# Patient Record
Sex: Male | Born: 1950
Health system: Southern US, Community
[De-identification: ages and names within clinical notes are randomized; demographics above are authoritative.]

## PROBLEM LIST (undated history)

## (undated) DIAGNOSIS — Z860100 Personal history of colon polyps, unspecified: Secondary | ICD-10-CM

## (undated) DIAGNOSIS — I10 Essential (primary) hypertension: Secondary | ICD-10-CM

## (undated) DIAGNOSIS — G5600 Carpal tunnel syndrome, unspecified upper limb: Secondary | ICD-10-CM

## (undated) DIAGNOSIS — M1990 Unspecified osteoarthritis, unspecified site: Secondary | ICD-10-CM

## (undated) DIAGNOSIS — E785 Hyperlipidemia, unspecified: Secondary | ICD-10-CM

## (undated) DIAGNOSIS — I209 Angina pectoris, unspecified: Secondary | ICD-10-CM

## (undated) DIAGNOSIS — N200 Calculus of kidney: Secondary | ICD-10-CM

## (undated) DIAGNOSIS — R0602 Shortness of breath: Secondary | ICD-10-CM

## (undated) DIAGNOSIS — E119 Type 2 diabetes mellitus without complications: Secondary | ICD-10-CM

## (undated) DIAGNOSIS — K219 Gastro-esophageal reflux disease without esophagitis: Secondary | ICD-10-CM

## (undated) DIAGNOSIS — G4733 Obstructive sleep apnea (adult) (pediatric): Secondary | ICD-10-CM

## (undated) DIAGNOSIS — I2510 Atherosclerotic heart disease of native coronary artery without angina pectoris: Secondary | ICD-10-CM

## (undated) DIAGNOSIS — N400 Enlarged prostate without lower urinary tract symptoms: Secondary | ICD-10-CM

## (undated) HISTORY — DX: Calculus of kidney: N20.0

## (undated) HISTORY — DX: Hyperlipidemia, unspecified: E78.5

## (undated) HISTORY — DX: Obstructive sleep apnea (adult) (pediatric): G47.33

## (undated) HISTORY — DX: Personal history of colon polyps, unspecified: Z86.0100

## (undated) HISTORY — DX: Carpal tunnel syndrome, unspecified upper limb: G56.00

## (undated) HISTORY — PX: HERNIA REPAIR: SHX51

## (undated) HISTORY — DX: Unspecified osteoarthritis, unspecified site: M19.90

## (undated) HISTORY — PX: CARDIAC CATHETERIZATION: SHX172

## (undated) HISTORY — DX: Essential (primary) hypertension: I10

## (undated) HISTORY — DX: Benign prostatic hyperplasia without lower urinary tract symptoms: N40.0

## (undated) HISTORY — PX: KNEE SURGERY: SHX244

## (undated) HISTORY — DX: Personal history of colonic polyps: Z86.010

---

## 1998-06-27 ENCOUNTER — Observation Stay (HOSPITAL_COMMUNITY): Admission: RE | Admit: 1998-06-27 | Discharge: 1998-06-28 | Payer: Self-pay | Admitting: *Deleted

## 2000-07-04 ENCOUNTER — Encounter: Payer: Self-pay | Admitting: Family Medicine

## 2000-07-04 ENCOUNTER — Encounter: Admission: RE | Admit: 2000-07-04 | Discharge: 2000-07-04 | Payer: Self-pay | Admitting: Family Medicine

## 2000-07-06 DIAGNOSIS — I671 Cerebral aneurysm, nonruptured: Secondary | ICD-10-CM

## 2000-07-15 ENCOUNTER — Encounter: Payer: Self-pay | Admitting: Internal Medicine

## 2000-07-15 ENCOUNTER — Encounter: Payer: Self-pay | Admitting: Emergency Medicine

## 2000-07-15 ENCOUNTER — Inpatient Hospital Stay (HOSPITAL_COMMUNITY): Admission: EM | Admit: 2000-07-15 | Discharge: 2000-07-16 | Payer: Self-pay | Admitting: Emergency Medicine

## 2000-07-29 ENCOUNTER — Encounter: Payer: Self-pay | Admitting: Internal Medicine

## 2001-12-27 ENCOUNTER — Encounter: Admission: RE | Admit: 2001-12-27 | Discharge: 2001-12-27 | Payer: Self-pay | Admitting: Family Medicine

## 2001-12-27 ENCOUNTER — Encounter: Payer: Self-pay | Admitting: Internal Medicine

## 2001-12-27 ENCOUNTER — Encounter: Payer: Self-pay | Admitting: Family Medicine

## 2002-08-09 ENCOUNTER — Emergency Department (HOSPITAL_COMMUNITY): Admission: EM | Admit: 2002-08-09 | Discharge: 2002-08-10 | Payer: Self-pay | Admitting: Emergency Medicine

## 2002-08-10 ENCOUNTER — Encounter: Payer: Self-pay | Admitting: Emergency Medicine

## 2002-08-11 ENCOUNTER — Observation Stay (HOSPITAL_COMMUNITY): Admission: RE | Admit: 2002-08-11 | Discharge: 2002-08-12 | Payer: Self-pay | Admitting: Urology

## 2002-08-11 ENCOUNTER — Encounter: Payer: Self-pay | Admitting: Urology

## 2002-08-20 ENCOUNTER — Ambulatory Visit (HOSPITAL_BASED_OUTPATIENT_CLINIC_OR_DEPARTMENT_OTHER): Admission: RE | Admit: 2002-08-20 | Discharge: 2002-08-20 | Payer: Self-pay | Admitting: Urology

## 2002-08-20 ENCOUNTER — Encounter: Payer: Self-pay | Admitting: Urology

## 2004-12-24 ENCOUNTER — Ambulatory Visit: Payer: Self-pay | Admitting: Internal Medicine

## 2005-01-25 ENCOUNTER — Ambulatory Visit: Payer: Self-pay | Admitting: Internal Medicine

## 2005-12-30 ENCOUNTER — Ambulatory Visit: Payer: Self-pay | Admitting: Internal Medicine

## 2006-01-28 ENCOUNTER — Ambulatory Visit: Payer: Self-pay | Admitting: Internal Medicine

## 2006-02-01 ENCOUNTER — Ambulatory Visit: Payer: Self-pay | Admitting: Internal Medicine

## 2006-12-22 ENCOUNTER — Ambulatory Visit: Payer: Self-pay | Admitting: Internal Medicine

## 2007-01-20 ENCOUNTER — Ambulatory Visit: Payer: Self-pay | Admitting: Gastroenterology

## 2007-01-23 ENCOUNTER — Ambulatory Visit: Payer: Self-pay | Admitting: Internal Medicine

## 2007-01-25 ENCOUNTER — Ambulatory Visit: Payer: Self-pay

## 2007-02-03 ENCOUNTER — Ambulatory Visit: Payer: Self-pay | Admitting: Gastroenterology

## 2007-02-03 ENCOUNTER — Encounter: Payer: Self-pay | Admitting: Gastroenterology

## 2007-02-04 HISTORY — PX: MENISCUS REPAIR: SHX5179

## 2007-02-23 ENCOUNTER — Ambulatory Visit (HOSPITAL_COMMUNITY): Admission: RE | Admit: 2007-02-23 | Discharge: 2007-02-23 | Payer: Self-pay | Admitting: Orthopedic Surgery

## 2007-04-11 ENCOUNTER — Ambulatory Visit: Payer: Self-pay | Admitting: Gastroenterology

## 2007-04-17 DIAGNOSIS — E785 Hyperlipidemia, unspecified: Secondary | ICD-10-CM | POA: Insufficient documentation

## 2007-04-17 DIAGNOSIS — G5600 Carpal tunnel syndrome, unspecified upper limb: Secondary | ICD-10-CM

## 2007-04-17 DIAGNOSIS — I10 Essential (primary) hypertension: Secondary | ICD-10-CM

## 2007-04-17 DIAGNOSIS — G4733 Obstructive sleep apnea (adult) (pediatric): Secondary | ICD-10-CM

## 2007-04-24 ENCOUNTER — Ambulatory Visit: Payer: Self-pay | Admitting: Internal Medicine

## 2007-04-25 LAB — CONVERTED CEMR LAB
ALT: 24 units/L (ref 0–40)
AST: 19 units/L (ref 0–37)
Albumin: 4.2 g/dL (ref 3.5–5.2)
Alkaline Phosphatase: 86 units/L (ref 39–117)
Basophils Absolute: 0.1 10*3/uL (ref 0.0–0.1)
CO2: 30 meq/L (ref 19–32)
Creatinine, Ser: 1 mg/dL (ref 0.4–1.5)
Hgb A1c MFr Bld: 7.5 % — ABNORMAL HIGH (ref 4.6–6.0)
MCHC: 34.2 g/dL (ref 30.0–36.0)
Monocytes Relative: 7.6 % (ref 3.0–11.0)
Phosphorus: 3.5 mg/dL (ref 2.3–4.6)
RBC: 5.27 M/uL (ref 4.22–5.81)
RDW: 12.9 % (ref 11.5–14.6)
Sodium: 140 meq/L (ref 135–145)

## 2007-10-26 ENCOUNTER — Ambulatory Visit: Payer: Self-pay | Admitting: Internal Medicine

## 2007-10-26 DIAGNOSIS — M1710 Unilateral primary osteoarthritis, unspecified knee: Secondary | ICD-10-CM

## 2007-10-26 DIAGNOSIS — M179 Osteoarthritis of knee, unspecified: Secondary | ICD-10-CM | POA: Insufficient documentation

## 2007-10-27 ENCOUNTER — Ambulatory Visit: Payer: Self-pay | Admitting: Pulmonary Disease

## 2007-11-01 ENCOUNTER — Encounter: Admission: RE | Admit: 2007-11-01 | Discharge: 2007-11-01 | Payer: Self-pay | Admitting: Internal Medicine

## 2007-11-01 ENCOUNTER — Encounter: Payer: Self-pay | Admitting: Internal Medicine

## 2007-11-14 ENCOUNTER — Encounter: Payer: Self-pay | Admitting: Pulmonary Disease

## 2007-11-23 ENCOUNTER — Telehealth (INDEPENDENT_AMBULATORY_CARE_PROVIDER_SITE_OTHER): Payer: Self-pay | Admitting: *Deleted

## 2007-11-28 ENCOUNTER — Ambulatory Visit: Payer: Self-pay | Admitting: Internal Medicine

## 2007-11-28 DIAGNOSIS — N401 Enlarged prostate with lower urinary tract symptoms: Secondary | ICD-10-CM

## 2007-11-28 DIAGNOSIS — N138 Other obstructive and reflux uropathy: Secondary | ICD-10-CM

## 2007-11-28 LAB — CONVERTED CEMR LAB
ALT: 26 units/L (ref 0–53)
AST: 20 units/L (ref 0–37)
Albumin: 4.1 g/dL (ref 3.5–5.2)
Alkaline Phosphatase: 81 units/L (ref 39–117)
BUN: 13 mg/dL (ref 6–23)
Basophils Relative: 0.9 % (ref 0.0–1.0)
CO2: 29 meq/L (ref 19–32)
Cholesterol: 214 mg/dL (ref 0–200)
Direct LDL: 135.4 mg/dL
Glucose, Bld: 154 mg/dL — ABNORMAL HIGH (ref 70–99)
Hemoglobin: 15.9 g/dL (ref 13.0–17.0)
Monocytes Absolute: 0.7 10*3/uL (ref 0.2–0.7)
Monocytes Relative: 9.7 % (ref 3.0–11.0)
Phosphorus: 3 mg/dL (ref 2.3–4.6)
Potassium: 4.1 meq/L (ref 3.5–5.1)
RBC: 5.23 M/uL (ref 4.22–5.81)
RDW: 12.7 % (ref 11.5–14.6)
Sodium: 139 meq/L (ref 135–145)
TSH: 1.82 microintl units/mL (ref 0.35–5.50)
Total Bilirubin: 0.9 mg/dL (ref 0.3–1.2)
Total CHOL/HDL Ratio: 5.6
VLDL: 59 mg/dL — ABNORMAL HIGH (ref 0–40)

## 2007-12-28 ENCOUNTER — Telehealth (INDEPENDENT_AMBULATORY_CARE_PROVIDER_SITE_OTHER): Payer: Self-pay | Admitting: *Deleted

## 2008-02-15 ENCOUNTER — Telehealth (INDEPENDENT_AMBULATORY_CARE_PROVIDER_SITE_OTHER): Payer: Self-pay | Admitting: *Deleted

## 2008-03-04 ENCOUNTER — Ambulatory Visit: Payer: Self-pay | Admitting: Internal Medicine

## 2008-03-13 ENCOUNTER — Encounter: Payer: Self-pay | Admitting: Internal Medicine

## 2008-03-13 ENCOUNTER — Ambulatory Visit: Payer: Self-pay

## 2008-04-02 ENCOUNTER — Telehealth (INDEPENDENT_AMBULATORY_CARE_PROVIDER_SITE_OTHER): Payer: Self-pay | Admitting: *Deleted

## 2008-04-15 ENCOUNTER — Telehealth (INDEPENDENT_AMBULATORY_CARE_PROVIDER_SITE_OTHER): Payer: Self-pay | Admitting: *Deleted

## 2008-05-10 ENCOUNTER — Ambulatory Visit: Payer: Self-pay | Admitting: Internal Medicine

## 2008-06-03 ENCOUNTER — Telehealth: Payer: Self-pay | Admitting: Family Medicine

## 2008-07-15 ENCOUNTER — Telehealth (INDEPENDENT_AMBULATORY_CARE_PROVIDER_SITE_OTHER): Payer: Self-pay | Admitting: *Deleted

## 2008-07-25 ENCOUNTER — Telehealth (INDEPENDENT_AMBULATORY_CARE_PROVIDER_SITE_OTHER): Payer: Self-pay | Admitting: *Deleted

## 2008-09-03 ENCOUNTER — Ambulatory Visit: Payer: Self-pay | Admitting: Internal Medicine

## 2008-09-06 LAB — CONVERTED CEMR LAB
ALT: 26 units/L (ref 0–53)
Alkaline Phosphatase: 74 units/L (ref 39–117)
BUN: 18 mg/dL (ref 6–23)
Bilirubin, Direct: 0.1 mg/dL (ref 0.0–0.3)
Chloride: 101 meq/L (ref 96–112)
Cholesterol: 233 mg/dL (ref 0–200)
Creatinine, Ser: 0.9 mg/dL (ref 0.4–1.5)
Eosinophils Relative: 2.7 % (ref 0.0–5.0)
GFR calc Af Amer: 112 mL/min
HDL: 32.4 mg/dL — ABNORMAL LOW (ref >39.0)
Microalb Creat Ratio: 45.6 mg/g — ABNORMAL HIGH (ref 0.0–30.0)
Monocytes Relative: 9.9 % (ref 3.0–12.0)
Neutrophils Relative %: 54.4 % (ref 43.0–77.0)
PSA: 0.47 ng/mL (ref 0.10–4.00)
Phosphorus: 2.5 mg/dL (ref 2.3–4.6)
Platelets: 222 10*3/uL (ref 150–400)
TSH: 2.2 microintl units/mL (ref 0.35–5.50)
Total CHOL/HDL Ratio: 7.2
Total Protein: 6.6 g/dL (ref 6.0–8.3)
Triglycerides: 416 mg/dL (ref 0–149)
WBC: 6.4 10*3/uL (ref 4.5–10.5)

## 2008-12-03 ENCOUNTER — Telehealth: Payer: Self-pay | Admitting: Internal Medicine

## 2009-01-28 ENCOUNTER — Telehealth: Payer: Self-pay | Admitting: Internal Medicine

## 2009-03-05 ENCOUNTER — Ambulatory Visit: Payer: Self-pay | Admitting: Internal Medicine

## 2009-03-06 LAB — CONVERTED CEMR LAB
Alkaline Phosphatase: 75 units/L (ref 39–117)
BUN: 15 mg/dL (ref 6–23)
Basophils Absolute: 0.1 10*3/uL (ref 0.0–0.1)
Basophils Relative: 0.9 % (ref 0.0–3.0)
Bilirubin, Direct: 0 mg/dL (ref 0.0–0.3)
CO2: 30 meq/L (ref 19–32)
Calcium: 9.1 mg/dL (ref 8.4–10.5)
Creatinine, Ser: 0.9 mg/dL (ref 0.4–1.5)
Eosinophils Absolute: 0.2 10*3/uL (ref 0.0–0.7)
Lymphocytes Relative: 31.3 % (ref 12.0–46.0)
MCHC: 33.6 g/dL (ref 30.0–36.0)
Neutrophils Relative %: 55.4 % (ref 43.0–77.0)
Platelets: 207 10*3/uL (ref 150.0–400.0)
RBC: 4.92 M/uL (ref 4.22–5.81)
RDW: 12.8 % (ref 11.5–14.6)
Sed Rate: 11 mm/hr (ref 0–22)
Total Bilirubin: 0.8 mg/dL (ref 0.3–1.2)

## 2009-03-18 ENCOUNTER — Telehealth: Payer: Self-pay | Admitting: Internal Medicine

## 2009-05-06 ENCOUNTER — Telehealth: Payer: Self-pay | Admitting: Internal Medicine

## 2009-06-11 ENCOUNTER — Ambulatory Visit: Payer: Self-pay | Admitting: Internal Medicine

## 2009-06-11 LAB — CONVERTED CEMR LAB: Hgb A1c MFr Bld: 7.6 % — ABNORMAL HIGH (ref 4.6–6.5)

## 2009-07-07 ENCOUNTER — Telehealth: Payer: Self-pay | Admitting: Internal Medicine

## 2009-11-10 ENCOUNTER — Telehealth: Payer: Self-pay | Admitting: Internal Medicine

## 2009-12-29 ENCOUNTER — Telehealth: Payer: Self-pay | Admitting: Internal Medicine

## 2009-12-30 ENCOUNTER — Ambulatory Visit: Payer: Self-pay | Admitting: Internal Medicine

## 2010-03-02 ENCOUNTER — Telehealth: Payer: Self-pay | Admitting: Internal Medicine

## 2010-04-20 ENCOUNTER — Telehealth: Payer: Self-pay | Admitting: Internal Medicine

## 2010-04-28 ENCOUNTER — Ambulatory Visit: Payer: Self-pay | Admitting: Internal Medicine

## 2010-04-28 DIAGNOSIS — L570 Actinic keratosis: Secondary | ICD-10-CM

## 2010-04-28 DIAGNOSIS — Z860100 Personal history of colon polyps, unspecified: Secondary | ICD-10-CM | POA: Insufficient documentation

## 2010-05-06 LAB — CONVERTED CEMR LAB
ALT: 27 units/L (ref 0–53)
Alkaline Phosphatase: 75 units/L (ref 39–117)
BUN: 19 mg/dL (ref 6–23)
Basophils Absolute: 0.1 10*3/uL (ref 0.0–0.1)
Bilirubin, Direct: 0.1 mg/dL (ref 0.0–0.3)
Cholesterol: 228 mg/dL — ABNORMAL HIGH (ref 0–200)
Creatinine, Ser: 0.9 mg/dL (ref 0.4–1.5)
Creatinine,U: 158.3 mg/dL
Direct LDL: 132.8 mg/dL
Eosinophils Relative: 2.9 % (ref 0.0–5.0)
GFR calc non Af Amer: 88.23 mL/min (ref >60)
Glucose, Bld: 159 mg/dL — ABNORMAL HIGH (ref 70–99)
Hgb A1c MFr Bld: 8.7 % — ABNORMAL HIGH (ref 4.6–6.5)
Lymphs Abs: 2.1 10*3/uL (ref 0.7–4.0)
Microalb Creat Ratio: 6.4 mg/g (ref 0.0–30.0)
Microalb, Ur: 10.1 mg/dL — ABNORMAL HIGH (ref 0.0–1.9)
Monocytes Absolute: 0.7 10*3/uL (ref 0.1–1.0)
Monocytes Relative: 10.5 % (ref 3.0–12.0)
Neutrophils Relative %: 53.9 % (ref 43.0–77.0)
Platelets: 220 10*3/uL (ref 150.0–400.0)
RDW: 13.6 % (ref 11.5–14.6)
Sodium: 138 meq/L (ref 135–145)
TSH: 1.45 microintl units/mL (ref 0.35–5.50)
Total Protein: 6.7 g/dL (ref 6.0–8.3)
VLDL: 73 mg/dL — ABNORMAL HIGH (ref 0.0–40.0)
WBC: 6.9 10*3/uL (ref 4.5–10.5)

## 2010-06-05 ENCOUNTER — Telehealth: Payer: Self-pay | Admitting: Family Medicine

## 2010-06-09 ENCOUNTER — Encounter: Payer: Self-pay | Admitting: Family Medicine

## 2010-06-10 ENCOUNTER — Telehealth (INDEPENDENT_AMBULATORY_CARE_PROVIDER_SITE_OTHER): Payer: Self-pay | Admitting: *Deleted

## 2010-07-08 ENCOUNTER — Telehealth: Payer: Self-pay | Admitting: Family Medicine

## 2010-07-31 ENCOUNTER — Telehealth: Payer: Self-pay | Admitting: Internal Medicine

## 2010-08-03 ENCOUNTER — Ambulatory Visit: Payer: Self-pay | Admitting: Internal Medicine

## 2010-08-31 ENCOUNTER — Telehealth: Payer: Self-pay | Admitting: Internal Medicine

## 2010-11-02 ENCOUNTER — Ambulatory Visit: Payer: Self-pay | Admitting: Internal Medicine

## 2010-11-02 LAB — HM DIABETES FOOT EXAM

## 2010-11-03 LAB — CONVERTED CEMR LAB
Albumin: 4.2 g/dL (ref 3.5–5.2)
BUN: 16 mg/dL (ref 6–23)
Calcium: 9.4 mg/dL (ref 8.4–10.5)
Creatinine, Ser: 0.9 mg/dL (ref 0.4–1.5)
Glucose, Bld: 203 mg/dL — ABNORMAL HIGH (ref 70–99)
Phosphorus: 3.3 mg/dL (ref 2.3–4.6)
Potassium: 3.9 meq/L (ref 3.5–5.1)

## 2011-01-03 LAB — CONVERTED CEMR LAB
AST: 20 units/L (ref 0–37)
Albumin: 4 g/dL (ref 3.5–5.2)
Alkaline Phosphatase: 75 units/L (ref 39–117)
Basophils Absolute: 0.1 10*3/uL (ref 0.0–0.1)
CO2: 30 meq/L (ref 19–32)
CO2: 31 meq/L (ref 19–32)
Calcium: 9.2 mg/dL (ref 8.4–10.5)
Calcium: 9.3 mg/dL (ref 8.4–10.5)
Chloride: 100 meq/L (ref 96–112)
Chloride: 104 meq/L (ref 96–112)
Cholesterol: 155 mg/dL (ref 0–200)
Cholesterol: 208 mg/dL (ref 0–200)
Eosinophils Absolute: 0.2 10*3/uL (ref 0.0–0.7)
GFR calc non Af Amer: 92 mL/min
Glucose, Bld: 159 mg/dL — ABNORMAL HIGH (ref 70–99)
Glucose, Bld: 202 mg/dL — ABNORMAL HIGH (ref 70–99)
HDL: 35.9 mg/dL — ABNORMAL LOW (ref >39.0)
HDL: 42.1 mg/dL (ref >39.0)
Hgb A1c MFr Bld: 7.5 % — ABNORMAL HIGH (ref 4.6–6.0)
LDL Cholesterol: 82 mg/dL (ref 0–99)
Lymphocytes Relative: 25.7 % (ref 12.0–46.0)
MCHC: 32.4 g/dL (ref 30.0–36.0)
MCV: 90.2 fL (ref 78.0–100.0)
Microalb Creat Ratio: 63.3 mg/g — ABNORMAL HIGH (ref 0.0–30.0)
Microalb, Ur: 7.1 mg/dL — ABNORMAL HIGH (ref 0.0–1.9)
Neutro Abs: 4.4 10*3/uL (ref 1.4–7.7)
Neutrophils Relative %: 62.4 % (ref 43.0–77.0)
Potassium: 3.9 meq/L (ref 3.5–5.1)
RDW: 12.9 % (ref 11.5–14.6)
Sed Rate: 10 mm/hr (ref 0–16)
Sed Rate: 10 mm/hr (ref 0–20)
Sodium: 136 meq/L (ref 135–145)
Sodium: 140 meq/L (ref 135–145)
TSH: 1.63 microintl units/mL (ref 0.35–5.50)
Total Bilirubin: 0.9 mg/dL (ref 0.3–1.2)
Total CK: 261 units/L (ref 7–195)
Triglycerides: 156 mg/dL — ABNORMAL HIGH (ref 0–149)
VLDL: 38 mg/dL (ref 0–40)

## 2011-01-05 NOTE — Assessment & Plan Note (Signed)
Summary: sinus/congestion/ds   Vital Signs:  Patient profile:   60 year old male Weight:      233 pounds BMI:     37.74 Temp:     98.4 degrees F oral Pulse rate:   74 / minute Pulse rhythm:   regular Resp:     12 per minute BP sitting:   140 / 80  (left arm) Cuff size:   large  Vitals Entered By: Mervin Hack CMA Duncan Dull) (December 30, 2009 12:48 PM) CC: sinus/ congestion   History of Present Illness: Started with bad cold about 1 month ago used OTC meds and felt better  Started with symptoms again 2 weeks ago "downhill since" couldn't make it into work 3 days ago felt feverish but thermometer broken generalized aching burning in nostrils coughing up purulent mucus Bloody, purulent nasal secretions--relates to using nasal decongestant  Had some SOB last night wears the CPAP and it made him feel worse due to the cold air  Tried guafenisin and DM--helped cough some  Some scratchy throat some right ear pain--intermittent  Allergies: 1)  Zocor (Simvastatin) 2)  Amaryl (Glimepiride) 3)  * Doxycycline 4)  Avandia (Rosiglitazone Maleate) 5)  Glucotrol Xl (Glipizide) 6)  Doxazosin Mesylate (Doxazosin Mesylate)  Past History:  Past medical, surgical, family and social histories (including risk factors) reviewed for relevance to current acute and chronic problems.  Past Medical History: Reviewed history from 03/04/2008 and no changes required. OSTEOARTHRITIS (ICD-715.90)--------------------------------------------------Dr Supple CEREBRAL ANEURYSM (ICD-437.3) HYPERLIPIDEMIA (ICD-272.4) COLONIC POLYPS, HX OF, TUBULAR ADENOMA (ICD-V12.72) CARPAL TUNNEL SYNDROME (ICD-354.0) SLEEP APNEA (ICD-780.57)-------------------------------------------------------Dr Clance HYPERTENSION (ICD-401.9) DIABETES MELLITUS, TYPE II, NEPHROP. (ICD-250.00) Benign prostatic hypertrophy  Past Surgical History: Reviewed history from 03/04/2008 and no changes required. Kidney stones Knee  surgery (left) Admit, vertigo negative CT, negative MRI--poss 71m aneurysm on MRA  08/01 MRA- ? 3mm carotid aneurysm  08/01 Colonoscoopy- polyp, recheck 2005, 12/02 Cardiolite stress, negative 11/04 ABI's normal --2/08 R knee meniscus repair--3/08 (Dr Rennis Chris)  Family History: Reviewed history from 04/17/2007 and no changes required. Father: Alive- CAD Mother: ?anxiety Siblings: One brother/1 sister No HTN, DM or cancer  Social History: Reviewed history from 10/26/2007 and no changes required. 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  NAD Head:  no frontal or maxillary tenderness Ears:  R ear normal and L ear normal.   Nose:  marked inflammation bilat Mouth:  no erythema and no exudates.   Neck:  supple, no masses, and no cervical lymphadenopathy.   Lungs:  normal respiratory effort, no intercostal retractions, no accessory muscle use, normal breath sounds, no dullness, no crackles, and no wheezes.     Impression & Recommendations:  Problem # 1:  SINUSITIS - ACUTE-NOS (ICD-461.9) Assessment New  seems more than mildly ill though no evidence of lower resp involvement will continue supportive care  will Rx augmentin  His updated medication list for this problem includes:    Amoxicillin-pot Clavulanate 875-125 Mg Tabs (Amoxicillin-pot clavulanate) .Marland Kitchen... 1 tab by mouth two times a day after food for sinus infection  Complete Medication List: 1)  Actos 30 Mg Tabs (Pioglitazone hcl) .... Take 1 tablet by mouth once a day 2)  Metformin Hcl 500 Mg Tabs (Metformin hcl) .... Take 1 tablet by mouth twice a day 3)  Maxzide-25 37.5-25 Mg  Tabs (Triamterene-hctz) .... Take 1 tablet by mouth once a day 4)  Diovan 160 Mg Tabs (Valsartan) .... Take 1 tablet by mouth once a day 5)  C Pap 10  6)  Vicodin  5-500 Mg Tabs (Hydrocodone-acetaminophen) .Marland Kitchen.. 1 two times a day as needed severe pain 7)  Amoxicillin-pot Clavulanate 875-125 Mg Tabs (Amoxicillin-pot clavulanate) .Marland Kitchen.. 1 tab by mouth two times a day after food for sinus infection  Patient Instructions: 1)  Please schedule a follow-up appointment as needed .  2)  Please keep regular follow up appt Prescriptions: AMOXICILLIN-POT CLAVULANATE 875-125 MG TABS (AMOXICILLIN-POT CLAVULANATE) 1 tab by mouth two times a day after food for sinus infection  #20 x 0   Entered and Authorized by:   Cindee Salt MD   Signed by:   Cindee Salt MD on 12/30/2009   Method used:   Electronically to        [facility]* (retail)       [address]       [place], [place]  [address]       Ph: [phone]       Fax: [phone]   RxID:   [id number]   Current Allergies (reviewed today): ZOCOR (SIMVASTATIN) AMARYL (GLIMEPIRIDE) * DOXYCYCLINE AVANDIA (ROSIGLITAZONE MALEATE) GLUCOTROL XL (GLIPIZIDE) DOXAZOSIN MESYLATE (DOXAZOSIN MESYLATE)

## 2011-01-05 NOTE — Assessment & Plan Note (Signed)
Summary: FOLLOW UP / LFW   Vital Signs:  Patient profile:   60 year old male Weight:      232 pounds BMI:     37.58 Temp:     98.5 degrees F oral Pulse rate:   68 / minute Pulse rhythm:   regular BP sitting:   140 / 80  (left arm) Cuff size:   large  Vitals Entered By: Mervin Hack CMA Duncan Dull) (November 02, 2010 10:20 AM) CC: 6 MONTH FOLLOW-UP   History of Present Illness: Doing okay  Has had more arm and leg pain since doubling up the metformin Has needed to use the hydrocodone two times a day lately some SOB also Had to cut back to 500mg  two times a day about 3 weeks ago Now arm pain seems to be settling down  Also noted emotional issues would fire up and get "ill" on the higher dose better again  checks random sugars a couple of times per week 140-190  Uses CPAP notes increased sinus problems in the morning Didn't hook up the saline port--he will work on that  No chest pain SOB with the higher dose of metformin---has improved again on the lower dose  Allergies: 1)  ! Losartan Potassium-Hctz (Losartan Potassium-Hctz) 2)  Zocor (Simvastatin) 3)  Amaryl (Glimepiride) 4)  * Doxycycline 5)  Avandia (Rosiglitazone Maleate) 6)  Doxazosin Mesylate (Doxazosin Mesylate) 7)  Glucotrol Xl (Glipizide)  Past History:  Past medical, surgical, family and social histories (including risk factors) reviewed for relevance to current acute and chronic problems.  Past Medical History: Reviewed history from 04/28/2010 and no changes required. Benign prostatic hypertrophy Colonic polyps, hx of---------------------tubular adenoma Diabetes mellitus, type II Hyperlipidemia Hypertension Cerebral aneurysm Carpal tunnel syndrom Obstructive sleep apnea----------------------------------------------Dr Clance Osteoarthritis  Past Surgical History: Reviewed history from 03/04/2008 and no changes required. Kidney stones Knee surgery (left) Admit, vertigo negative CT,  negative MRI--poss 23m aneurysm on MRA  08/01 MRA- ? 3mm carotid aneurysm  08/01 Colonoscoopy- polyp, recheck 2005, 12/02 Cardiolite stress, negative 11/04 ABI's normal --2/08 R knee meniscus repair--3/08 (Dr Rennis Chris)  Family History: Reviewed history from 04/17/2007 and no changes required. Father: Alive- CAD Mother: ?anxiety Siblings: One brother/1 sister No HTN, DM or cancer  Social History: Reviewed history from 10/26/2007 and no changes required. 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   Neck:  supple, no masses, no thyromegaly, no carotid bruits, and no cervical lymphadenopathy.   Lungs:  normal respiratory effort, no intercostal retractions, no accessory muscle use, and normal breath sounds.   Heart:  normal rate, regular rhythm, no murmur, and no gallop.   Abdomen:  soft and non-tender.   Pulses:  2+ in feet Extremities:  no edema Skin:  no suspicious lesions and no ulcerations.   Psych:  normally interactive, good eye contact, not anxious appearing, and not depressed appearing.    Diabetes Management Exam:    Foot Exam (with socks and/or shoes not present):       Sensory-Pinprick/Light touch:          Left medial foot (L-4): normal          Left dorsal foot (L-5): normal          Left lateral foot (S-1): normal  Right medial foot (L-4): normal          Right dorsal foot (L-5): normal          Right lateral foot (S-1): normal       Inspection:          Left foot: normal          Right foot: normal       Nails:          Left foot: normal          Right foot: normal   Impression & Recommendations:  Problem # 1:  DIABETES MELLITUS, TYPE II (ICD-250.00) Assessment Comment Only  still not compliant with diet discussed this Can't  tolerate higher metformin will add januvia if A1c over 9%  His updated medication list for this problem includes:    Metformin Hcl 1000 Mg Tabs (Metformin hcl) .Marland Kitchen... 1/2 tab by mouth two times a day before meals for diabetes    Diovan Hct 160-25 Mg Tabs (Valsartan-hydrochlorothiazide) .Marland Kitchen... 1 tablet daily for high blood pressure    Actos 30 Mg Tabs (Pioglitazone hcl) .Marland Kitchen... Take 1 tablet by mouth once a day  Labs Reviewed: Creat: 0.9 (04/28/2010)     Last Eye Exam: No retinopathy (03/06/2010) Reviewed HgBA1c results: 8.7 (04/28/2010)  7.6 (06/11/2009)  Orders: TLB-A1C / Hgb A1C (Glycohemoglobin) (83036-A1C)  Problem # 2:  HYPERTENSION (ICD-401.9) Assessment: Unchanged  reasonable control no changes  His updated medication list for this problem includes:    Diovan Hct 160-25 Mg Tabs (Valsartan-hydrochlorothiazide) .Marland Kitchen... 1 tablet daily for high blood pressure  BP today: 140/80 Prior BP: 128/70 (08/03/2010)  Labs Reviewed: K+: 3.5 (04/28/2010) Creat: : 0.9 (04/28/2010)   Chol: 228 (04/28/2010)   HDL: 39.70 (04/28/2010)   LDL: DEL (09/03/2008)   TG: 365.0 (04/28/2010)  Orders: TLB-Renal Function Panel (80069-RENAL) Venipuncture (42595)  Problem # 3:  OSTEOARTHRITIS (ICD-715.90) Assessment: Unchanged needs the analgesics at times  His updated medication list for this problem includes:    Vicodin 5-500 Mg Tabs (Hydrocodone-acetaminophen) .Marland Kitchen... 1 two times a day as needed severe pain  Problem # 4:  HYPERLIPIDEMIA (ICD-272.4) Assessment: Comment Only no meds for this due to myalgias  Complete Medication List: 1)  Metformin Hcl 1000 Mg Tabs (Metformin hcl) .... 1/2 tab by mouth two times a day before meals for diabetes 2)  Diovan Hct 160-25 Mg Tabs (Valsartan-hydrochlorothiazide) .Marland Kitchen.. 1 tablet daily for high blood pressure 3)  Actos 30 Mg Tabs (Pioglitazone hcl) .... Take 1 tablet by mouth once a day 4)  Vicodin 5-500 Mg Tabs (Hydrocodone-acetaminophen) .Marland Kitchen.. 1 two times a  day as needed severe pain 5)  C Pap 10   Patient Instructions: 1)  Please schedule a follow-up appointment in 6 months for physical Prescriptions: VICODIN 5-500 MG  TABS (HYDROCODONE-ACETAMINOPHEN) 1 two times a day as needed severe pain  #60 x 1   Entered and Authorized by:   Cindee Salt MD   Signed by:   Cindee Salt MD on 11/02/2010   Method used:   Print then Give to Patient   RxID:   [id number]    Orders Added: 1)  Est. Patient Level IV [16606] 2)  TLB-A1C / Hgb A1C (Glycohemoglobin) [83036-A1C] 3)  TLB-Renal Function Panel [80069-RENAL] 4)  Venipuncture [30160]    Current Allergies (reviewed today): ! LOSARTAN POTASSIUM-HCTZ (LOSARTAN POTASSIUM-HCTZ) ZOCOR (SIMVASTATIN) AMARYL (GLIMEPIRIDE) * DOXYCYCLINE AVANDIA (ROSIGLITAZONE MALEATE) DOXAZOSIN MESYLATE (DOXAZOSIN MESYLATE) GLUCOTROL XL (GLIPIZIDE)

## 2011-01-05 NOTE — Progress Notes (Signed)
Summary: regarding actos and bladder cancer  Phone Note Call from Patient Call back at Home Phone [phone]   Caller: Patient Call For: Cindee Salt MD Summary of Call: Pt states it's time for him to refill his actos but he is concerned about what he has heard on tv regarding actos causing bladder cancer.  Please advise on your opinion. Initial call taken by: Lowella Petties CMA,  August 31, 2010 2:35 PM  Follow-up for Phone Call        I have not heard any definitive evidence that actos is related to bladder cancer SOmetimes this stuff comes out in the popular press before doctor's have a chance to look at it in the medical journals. There have been no actions by the FDA or changes in labelling of actos so I don't think there is any clear cut problem with cancer Follow-up by: Cindee Salt MD,  August 31, 2010 5:06 PM  Additional Follow-up for Phone Call Additional follow up Details #1::        Tried to call patient and received a message stating that patient has not set up his voicemail yet. Unable to leave a message. Will try back later. Sydell Axon LPN  August 31, 2010 5:13 PM'  Left message on machine for patient to call back. Sydell Axon LPN  September 02, 2010 12:09 PM  Patient notified as instructed by telephone. Additional Follow-up by: Sydell Axon LPN,  September 02, 2010 12:56 PM

## 2011-01-05 NOTE — Progress Notes (Signed)
Summary: Head and chest congestion  Phone Note Call from Patient Call back at [phone]   Caller: Patient Call For: Cindee Salt MD Summary of Call: Pt said sinus infection and chest cold. Started 4 -5 days ago. Pt's head is congested, drainage at back of throat, scratchy throat,pt's thinks has fever, unsure how much thermometer is broken. Productive cough with grayish yellow mucus with streaks of blood, sore in chest when coughs, no shortness of breath or trouble breathing and no wheezing. Pt uses [facility] [phone]. Pt wonders if med could be called in. Please advise.  Initial call taken by: Lewanda Rife LPN,  December 29, 2009 11:44 AM  Follow-up for Phone Call        needs eval Okay to add on at end of day Follow-up by: Cindee Salt MD,  December 29, 2009 11:56 AM  Additional Follow-up for Phone Call Additional follow up Details #1::        pt states he has to work today and can't come in that late, pt scheduled appt for tomorrow @ 12:30. DeShannon Smith CMA Duncan Dull)  December 29, 2009 12:00 PM   Okay Additional Follow-up by: Cindee Salt MD,  December 29, 2009 1:23 PM

## 2011-01-05 NOTE — Assessment & Plan Note (Signed)
Summary: CPX/BIR   Vital Signs:  Patient profile:   60 year old male Weight:      236 pounds Temp:     98.6 degrees F oral Pulse rate:   68 / minute Pulse rhythm:   regular BP sitting:   120 / 70  (left arm) Cuff size:   large  Vitals Entered By: Mervin Hack CMA [facility]) (Apr 28, 2010 9:46 AM) CC: adult physical   History of Present Illness: Having some recent leg swelling and knee pain works outside all the time---doesn't remember anything new started about 1 week ago did have to stop the maxzide but on HCTZ  having left shoulder problems for a while now with tingling that radiates down radial forearm No arm weakness  Still gets right low flank pain seems to have defect he can stick his finger in and it hurts no real change  checks sugars--  ~2 times per week runs 160-165 No sores or pain in feet No sig hypoglycemic reactions  Allergies: 1)  Zocor (Simvastatin) 2)  Amaryl (Glimepiride) 3)  * Doxycycline 4)  Avandia (Rosiglitazone Maleate) 5)  Glucotrol Xl (Glipizide) 6)  Doxazosin Mesylate (Doxazosin Mesylate)  Past History:  Past medical, surgical, family and social histories (including risk factors) reviewed for relevance to current acute and chronic problems.  Past Medical History: Benign prostatic hypertrophy Colonic polyps, hx of---------------------tubular adenoma Diabetes mellitus, type II Hyperlipidemia Hypertension Cerebral aneurysm Carpal tunnel syndrom Obstructive sleep apnea----------------------------------------------Dr Clance Osteoarthritis  Past Surgical History: Reviewed history from 03/04/2008 and no changes required. Kidney stones Knee surgery (left) Admit, vertigo negative CT, negative MRI--poss 45m aneurysm on MRA  08/01 MRA- ? 3mm carotid aneurysm  08/01 Colonoscoopy- polyp, recheck 2005, 12/02 Cardiolite stress, negative 11/04 ABI's normal --2/08 R knee meniscus repair--3/08 (Dr Rennis Chris)  Family History: Reviewed history  from 04/17/2007 and no changes required. Father: Alive- CAD Mother: ?anxiety Siblings: One brother/1 sister No HTN, DM or cancer  Social History: Reviewed history from 10/26/2007 and no changes required. 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 Eyes:  Denies double vision and vision loss-1 eye. ENT:  Denies decreased hearing and ringing in ears; teeth in poor shape---needs some work on top and to get plate on bottom. CV:  Complains of lightheadness and shortness of breath with exertion; denies chest pain or discomfort, difficulty breathing at night, difficulty breathing while lying down, fainting, and palpitations; occ dizzy spells ---can't remember when or what they are llke. Doesn't change his activity for it Has had a couple of brief SOB---better in seconds with sigh (at work). Resp:  Denies cough and shortness of breath. GI:  Complains of nausea; denies indigestion and vomiting; Occ AM nausea---relates to wearing CPAP mask. GU:  Complains of nocturia and urinary hesitancy; denies erectile dysfunction and urinary frequency; nocturia x 1. MS:  See HPI; Complains of joint pain; denies joint swelling; tendonitis briefly in left elbow generally uses 1 hydrocodone daily. Derm:  Complains of lesion(s); denies rash; right preauricular lesion has recurred. Neuro:  See HPI; Complains of tingling; denies headaches and weakness. Psych:  Denies anxiety and depression. Heme:  Denies abnormal bruising and enlarge lymph nodes. Allergy:  Complains of seasonal allergies and sneezing; no meds for this.  Physical Exam  General:  alert and normal appearance.   Eyes:  pupils equal, pupils round, and pupils reactive to light.  Ears:  R ear normal and L ear normal.   Mouth:  no erythema, no exudates, and no lesions.   TOngue fills entire mouth almost Neck:  supple, no  masses, no thyromegaly, no carotid bruits, and no cervical lymphadenopathy.   Lungs:  normal respiratory effort and normal breath sounds.   Heart:  normal rate, regular rhythm, no murmur, and no gallop.   Abdomen:  soft and non-tender.   Rectal:  deferred  PSA at work very low Msk:  Knees have normal ROM No instability no other active synoviitis Pulses:  2+ in feet Extremities:  no sig edema Neurologic:  alert & oriented X3, strength normal in all extremities, and gait normal.   Skin:  no rashes and no ulcerations.   Actinic in right preauricular area Axillary Nodes:  No palpable lymphadenopathy Psych:  normally interactive, good eye contact, not anxious appearing, and not depressed appearing.    Diabetes Management Exam:    Foot Exam (with socks and/or shoes not present):       Sensory-Pinprick/Light touch:          Left medial foot (L-4): normal          Left dorsal foot (L-5): normal          Left lateral foot (S-1): normal          Right medial foot (L-4): normal          Right dorsal foot (L-5): normal          Right lateral foot (S-1): normal       Inspection:          Left foot: normal          Right foot: normal       Nails:          Left foot: normal          Right foot: normal    Eye Exam:       Eye Exam done elsewhere          Date: 03/06/2010          Results: No retinopathy          Done by: Dr Senaida Ores   Impression & Recommendations:  Problem # 1:  PREVENTIVE HEALTH CARE (ICD-V70.0) Assessment Comment Only discussed weight and fitness up to date on colon had PSA at work  (?0.47)---he will send copy  Problem # 2:  DIABETES MELLITUS, TYPE II, NEPHROP. (ICD-250.00) Assessment: Unchanged  hopefully acceptable will recheck urine microal also  The following medications were removed from the medication list:    Diovan 160 Mg Tabs (Valsartan) .Marland Kitchen... Take 1 tablet by mouth once a day His updated medication list for this problem includes:    Actos 30 Mg  Tabs (Pioglitazone hcl) .Marland Kitchen... Take 1 tablet by mouth once a day    Metformin Hcl 500 Mg Tabs (Metformin hcl) .Marland Kitchen... Take 1 tablet by mouth twice a day    Losartan Potassium-hctz 100-25 Mg Tabs (Losartan potassium-hctz) .Marland Kitchen... 1 tab by mouth daily for high blood pressure  Labs Reviewed: Creat: 0.9 (03/05/2009)     Last Eye Exam: No retinopathy (03/06/2010) Reviewed HgBA1c results: 7.6 (06/11/2009)  8.3 (03/05/2009)  Orders: TLB-A1C / Hgb A1C (Glycohemoglobin) (83036-A1C) TLB-Microalbumin/Creat Ratio, Urine (82043-MALB)  Problem # 3:  OSTEOARTHRITIS (ICD-715.90) Assessment: Unchanged ongoing multiple sites okay with current Rx  His updated medication list for this problem includes:    Vicodin 5-500 Mg Tabs (Hydrocodone-acetaminophen) .Marland Kitchen... 1 two times  a day as needed severe pain  Problem # 4:  HYPERTENSION (ICD-401.9) Assessment: Unchanged  will change to losartan for price and combine with the HCTZ  The following medications were removed from the medication list:    Maxzide-25 37.5-25 Mg Tabs (Triamterene-hctz) .Marland Kitchen... Take 1 tablet by mouth once a day    Diovan 160 Mg Tabs (Valsartan) .Marland Kitchen... Take 1 tablet by mouth once a day    Hydrochlorothiazide 25 Mg Tabs (Hydrochlorothiazide) .Marland Kitchen... 1 tab daily for high blood pressure His updated medication list for this problem includes:    Losartan Potassium-hctz 100-25 Mg Tabs (Losartan potassium-hctz) .Marland Kitchen... 1 tab by mouth daily for high blood pressure  BP today: 120/70 Prior BP: 140/80 (12/30/2009)  Labs Reviewed: K+: 4.0 (03/05/2009) Creat: : 0.9 (03/05/2009)   Chol: 233 (09/03/2008)   HDL: 32.4 (09/03/2008)   LDL: DEL (09/03/2008)   TG: 416 (09/03/2008)  Orders: Venipuncture ([address]) TLB-Renal Function Panel (80069-RENAL) TLB-CBC Platelet - w/Differential (85025-CBCD) TLB-TSH (Thyroid Stimulating Hormone) (84443-TSH)  Problem # 5:  HYPERLIPIDEMIA (ICD-272.4)  intolerant of red yeast rice and statins ploans to try crill  oil  Labs Reviewed: SGOT: 22 (03/05/2009)   SGPT: 29 (03/05/2009)   HDL:32.4 (09/03/2008), 35.9 (03/04/2008)  LDL:DEL (09/03/2008), DEL (03/04/2008)  Chol:233 (09/03/2008), 208 (03/04/2008)  Trig:416 (09/03/2008), 191 (03/04/2008)  Orders: TLB-Lipid Panel (80061-LIPID) TLB-Hepatic/Liver Function Pnl (80076-HEPATIC)  Problem # 6:  ACTINIC KERATOSIS (ICD-702.0) Assessment: New  treated with liquid nitrogen 40 seconds x 2 tolerated well  Orders: Cryotherapy/Destruction benign or premalignant lesion (1st lesion)  ([address])  Complete Medication List: 1)  Actos 30 Mg Tabs (Pioglitazone hcl) .... Take 1 tablet by mouth once a day 2)  Metformin Hcl 500 Mg Tabs (Metformin hcl) .... Take 1 tablet by mouth twice a day 3)  Vicodin 5-500 Mg Tabs (Hydrocodone-acetaminophen) .Marland Kitchen.. 1 two times a day as needed severe pain 4)  Losartan Potassium-hctz 100-25 Mg Tabs (Losartan potassium-hctz) .Marland Kitchen.. 1 tab by mouth daily for high blood pressure 5)  C Pap 10   Other Orders: Pneumococcal Vaccine (60454) Admin 1st Vaccine (09811) Admin 1st Vaccine [facility]) [id number])  Patient Instructions: 1)  Please schedule a follow-up appointment in 6 months .  Prescriptions: LOSARTAN POTASSIUM-HCTZ 100-25 MG TABS (LOSARTAN POTASSIUM-HCTZ) 1 tab by mouth daily for high blood pressure  #90 x 3   Entered and Authorized by:   Cindee Salt MD   Signed by:   Cindee Salt MD on 04/28/2010   Method used:   Electronically to        [facility]* (mail-order)             ,          Ph: [phone]       Fax: [phone]   RxID:   [id number] DIOVAN 160 MG TABS (VALSARTAN) Take 1 tablet by mouth once a day  #90 x 3   Entered by:   Mervin Hack CMA (AAMA)   Authorized by:   Cindee Salt MD   Signed by:   Mervin Hack CMA (AAMA) on 04/28/2010   Method used:   Electronically to        [facility]* (mail-order)             ,          Ph: [phone]       Fax: [phone]   RxID:    [id number] METFORMIN HCL 500 MG TABS (METFORMIN HCL) Take 1 tablet by mouth twice a day  #  180 Each x 3   Entered by:   Mervin Hack CMA (AAMA)   Authorized by:   Cindee Salt MD   Signed by:   Mervin Hack CMA (AAMA) on 04/28/2010   Method used:   Electronically to        [facility] [address] (retail)       [address], [address]       [place], [place]  [address]       Ph: [phone]       Fax: [phone]   RxID:   [id number]   Current Allergies (reviewed today): ZOCOR (SIMVASTATIN) AMARYL (GLIMEPIRIDE) * DOXYCYCLINE AVANDIA (ROSIGLITAZONE MALEATE) GLUCOTROL XL (GLIPIZIDE) DOXAZOSIN MESYLATE (DOXAZOSIN MESYLATE)   Pneumovax Vaccine    Vaccine Type: Pneumovax    Site: left deltoid    Mfr: Merck    Dose: 0.5 ml    Route: IM    Given by: Mervin Hack CMA (AAMA)    Exp. Date: 09/30/2011    Lot #: [id number]    VIS given: 07/03/96 version given Apr 28, 2010.

## 2011-01-05 NOTE — Progress Notes (Signed)
Summary: Ria Comment  Phone Note [facility]ill Request Message [facility]:  [facility] [phone] on Apr 20, 2010 2:29 PM  Refills Requested: Medication #1:  VICODIN 5-500 MG  TABS 1 two times a day as needed severe pain   Last Refilled: 03/02/2010  Medication #2:  MAXZIDE-25 37.5-25 MG TABS Take 1 tablet by mouth once a day called pharmacy regarding last refill, I called in 60x1 on pt's last refill 3/28/2011and it looks like [facility] didn't add the refill. ok to add refill. Also, Maxide is not available in either strength, see form on your desk.   Method Requested: Telephone to Pharmacy Initial call taken by: DeShannon Katrinka Blazing CMA [facility]),  Apr 20, 2010 2:33 PM  Follow-up for Phone Call        okay to add the refill  I changed the diuretic Please have him set up appt in 3-4 weeks for renal profile on new med Follow-up by: Cindee Salt MD,  Apr 21, 2010 7:50 AM  Additional Follow-up for Phone Call Additional follow up Details #1::        left message on machine at home for patient to return my call.  DeShannon Smith CMA [facility])  Apr 21, 2010 8:23 AM   Patient called back and I advised results. He will set up lab appt on cpx in May Additional Follow-up by: Mervin Hack CMA [facility]),  Apr 21, 2010 10:21 AM    New/Updated Medications: HYDROCHLOROTHIAZIDE 25 MG TABS (HYDROCHLOROTHIAZIDE) 1 tab daily for high blood pressure Prescriptions: HYDROCHLOROTHIAZIDE 25 MG TABS (HYDROCHLOROTHIAZIDE) 1 tab daily for high blood pressure  #30 x 12   Entered and Authorized by:   Cindee Salt MD   Signed by:   Cindee Salt MD on 04/21/2010   Method used:   Electronically to        [facility] [address] (retail)       [address], [address]       [place], [place]  [address]       Ph: [phone]       Fax: [phone]   RxID:   [id number]

## 2011-01-05 NOTE — Progress Notes (Signed)
Summary: Probs with Losartan-HCTZ  Phone Note Call from Patient Call back at Home Phone [phone]   Caller: Patient Call For: Cindee Salt MD Summary of Call: Patient says he was switched from Diovan to Losartan-HCTZ about 5 days ago.  He is having problems with SOB, weakness, tingling in his legs.  She he stop the Losartan-HCTZ and go back to Diovan and if so, he needs a Rx. sent to  his pharmacy. Initial call taken by: Delilah Shan CMA (AAMA),  June 05, 2010 12:38 PM  Follow-up for Phone Call        [facility] to send diovan 160 mg , 1 tab by mouth daily.  #30 with no refills to his pharmacy. Yes, he can stop the losartan and follow up with dr. Alphonsus Sias next week. Ruthe Mannan MD  June 05, 2010 12:46 PM  Called patient at home number, answering machine is full and couldn't leave a message.  Will call back later.  Linde Gillis CMA [facility])  June 05, 2010 3:26 PM   Rx for Diovan sent to [facility] pharmacy, called patient's home number again and cannot leave a message machine is full.  Linde Gillis CMA [facility])  June 05, 2010 3:52 PM   Called patient's home number again, cannot leave a message machine full.  Linde Gillis CMA [facility])  June 05, 2010 4:47 PM   Left message on machine for patient to return call.  Linde Gillis CMA [facility])  June 09, 2010 8:16 AM   Patient called and left a message on my voicemail for me to return his call.  I called him back at his home number [phone] and left another message because I received no answer once again.  Linde Gillis CMA [facility])  June 09, 2010 11:42 AM   Unable to reach patient by telephone after several attemps, letter mailed.   Follow-up by: Linde Gillis CMA (AAMA),  June 09, 2010 3:15 PM    New/Updated Medications: DIOVAN 160 MG TABS (VALSARTAN) take one tablet by mouth daily Prescriptions: DIOVAN 160 MG TABS (VALSARTAN) take one tablet by mouth daily  #30 x 0   Entered by:   Linde Gillis CMA (AAMA)   Authorized by:   Ruthe Mannan MD   Signed by:    Linde Gillis CMA (AAMA) on 06/09/2010   Method used:   Electronically to        [facility] [address]ail[facility]       [address], [address]       [place], [place]  [address]       Ph: [phone]       Fax: [phone]   RxID:   [id number]     Current Allergies (reviewed today): ZOCOR (SIMVASTATIN) AMARYL (GLIMEPIRIDE) * DOXYCYCLINE AVANDIA (ROSIGLITAZONE MALEATE) GLUCOTROL XL (GLIPIZIDE) DOXAZOSIN MESYLATE (DOXAZOSIN MESYLATE)

## 2011-01-05 NOTE — Progress Notes (Signed)
Summary: Rx HCTZ  Phone Note Outgoing Call   Call placed by: Sydell Axon, LPN Call placed to: Patient Summary of Call: Called patient and verified that he is taking Diovan. (See phone note dated 06/05/10)  Patient stated that when he stopped the Losartan-HCTZ he started back on his HCTZ 25 mg, one daily. Patient requested that a new rx be sent to [facility] for his HCTZ. This medication was removed from his med list.  Initial call taken by: Sydell Axon LPN,  July 08, 2010 3:10 PM  Follow-up for Phone Call        [facility] to refill his HCTZ, pt needs to make appt with Dr. Alphonsus Sias to follow up. Ruthe Mannan MD  July 09, 2010 7:49 AM   Additional Follow-up for Phone Call Additional follow up Details #1::        Left message with wife to have patient return my call. Sydell Axon LPN  July 09, 2010 8:51 AM  Patient notified as instructed by telephone. Follow-up appointment scheduled with Dr. Alphonsus Sias. Additional Follow-up by: Sydell Axon LPN,  July 09, 2010 10:06 AM    New/Updated Medications: HYDROCHLOROTHIAZIDE 25 MG  TABS (HYDROCHLOROTHIAZIDE) Take 1 tab by mouth every morning Prescriptions: HYDROCHLOROTHIAZIDE 25 MG  TABS (HYDROCHLOROTHIAZIDE) Take 1 tab by mouth every morning  #30 x 0   Entered and Authorized by:   Ruthe Mannan MD   Signed by:   Ruthe Mannan MD on 07/09/2010   Method used:   Electronically to        [facility] [address]ail)       [address], [address]       [place], [place]  [address]       Ph: [phone]       Fax: [phone]   RxID:   [id number]

## 2011-01-05 NOTE — Letter (Signed)
Summary: Generic Letter  [facility]  [address] [place], [place] [address]   Phone: [phone]  Fax: [phone]    06/09/2010  [facility] DISPEN[facility] [address] [place], [place]  [address]  Dear Mr. Hoke,   We have been unable to reach you by telephone regarding your medication.  Please call our office at your earliest convenience.  When calling please ask to speak with Lowella Bandy, Medical Assistant for Dr. Ruthe Mannan.             Sincerely,       Ruthe Mannan, MD

## 2011-01-05 NOTE — Assessment & Plan Note (Signed)
Summary: Follow-up on meds per Dr. Dayton Martes   Vital Signs:  Patient profile:   60 year old male Weight:      240 pounds Temp:     98.4 degrees F oral Pulse rate:   72 / minute Pulse rhythm:   regular BP sitting:   128 / 70  (left arm) Cuff size:   large  Vitals Entered By: Mervin Hack CMA Duncan Dull) (August 03, 2010 11:21 AM) CC: follow-up visit on medications   History of Present Illness: Had chest and leg pains on the losartan "I didn't feel alright" Went back on the diovan and he does well with this  Needed to go back on the HCTZ Had been on maxzide but had shortages so went to combo agent with the losartan  now back on diovan so he knew he needed the HCTZ  also discussed his suboptimal control Discussed counselling---he knows what he should do but is not always compliant  Allergies: 1)  ! Losartan Potassium-Hctz (Losartan Potassium-Hctz) 2)  Zocor (Simvastatin) 3)  Amaryl (Glimepiride) 4)  * Doxycycline 5)  Avandia (Rosiglitazone Maleate) 6)  Doxazosin Mesylate (Doxazosin Mesylate) 7)  Glucotrol Xl (Glipizide)  Past History:  Past medical, surgical, family and social histories (including risk factors) reviewed for relevance to current acute and chronic problems.  Past Medical History: Reviewed history from 04/28/2010 and no changes required. Benign prostatic hypertrophy Colonic polyps, hx of---------------------tubular adenoma Diabetes mellitus, type II Hyperlipidemia Hypertension Cerebral aneurysm Carpal tunnel syndrom Obstructive sleep apnea----------------------------------------------Dr Clance Osteoarthritis  Past Surgical History: Reviewed history from 03/04/2008 and no changes required. Kidney stones Knee surgery (left) Admit, vertigo negative CT, negative MRI--poss 7m aneurysm on MRA  08/01 MRA- ? 3mm carotid aneurysm  08/01 Colonoscoopy- polyp, recheck 2005, 12/02 Cardiolite stress, negative 11/04 ABI's normal --2/08 R knee meniscus repair--3/08  (Dr Rennis Chris)  Family History: Reviewed history from 04/17/2007 and no changes required. Father: Alive- CAD Mother: ?anxiety Siblings: One brother/1 sister No HTN, DM or cancer  Social History: Reviewed history from 10/26/2007 and no changes required. 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 1 tab by mouth daily for high blood pressure    Diovan 160 Mg Tabs (Valsartan) .Marland Kitchen... Take one tablet by mouth daily    Hydrochlorothiazide 25 Mg Tabs (Hydrochlorothiazide) .Marland Kitchen... Take 1 tab by mouth every morning His updated medication list for this problem includes:    Diovan Hct 160-25 Mg Tabs (Valsartan-hydrochlorothiazide) .Marland Kitchen... 1 tablet daily for high blood pressure  BP today: 128/70 Prior BP: 120/70 (04/28/2010)  Labs Reviewed: K+: 3.5 (04/28/2010) Creat: : 0.9 (04/28/2010)   Chol: 228 (04/28/2010)   HDL: 39.70 (04/28/2010)   LDL: DEL (09/03/2008)   TG: 365.0 (04/28/2010)  Problem # 2:  DIABETES MELLITUS, TYPE II (ICD-250.00) Assessment: Comment Only not at goal will increase the metformin to 1000mg  two times a day  The following medications were removed from the medication list:    Metformin Hcl 500 Mg Tabs (Metformin hcl) .Marland Kitchen... Take 1 tablet by mouth twice a day  Losartan Potassium-hctz 100-25 Mg Tabs (Losartan potassium-hctz) .Marland Kitchen... 1 tab by mouth daily for high blood pressure    Diovan 160 Mg Tabs (Valsartan) .Marland Kitchen... Take one tablet by mouth daily His updated  medication list for this problem includes:    Metformin Hcl 1000 Mg Tabs (Metformin hcl) .Marland Kitchen... 1 tab by mouth two times a day before meals for diabetes    Diovan Hct 160-25 Mg Tabs (Valsartan-hydrochlorothiazide) .Marland Kitchen... 1 tablet daily for high blood pressure    Actos 30 Mg Tabs (Pioglitazone hcl) .Marland Kitchen... Take 1 tablet by mouth once a day  Labs Reviewed: Creat: 0.9 (04/28/2010)     Last Eye Exam: No retinopathy (03/06/2010) Reviewed HgBA1c results: 8.7 (04/28/2010)  7.6 (06/11/2009)  Complete Medication List: 1)  Metformin Hcl 1000 Mg Tabs (Metformin hcl) .Marland Kitchen.. 1 tab by mouth two times a day before meals for diabetes 2)  Diovan Hct 160-25 Mg Tabs (Valsartan-hydrochlorothiazide) .Marland Kitchen.. 1 tablet daily for high blood pressure 3)  Actos 30 Mg Tabs (Pioglitazone hcl) .... Take 1 tablet by mouth once a day 4)  Vicodin 5-500 Mg Tabs (Hydrocodone-acetaminophen) .Marland Kitchen.. 1 two times a day as needed severe pain 5)  C Pap 10   Patient Instructions: 1)  Please increase the metformin to 1000mg  two times a day  2)  use the diovan up with the HCTZ, then change to the combination agent 3)  Please keep the Novemeber appt Prescriptions: VICODIN 5-500 MG  TABS (HYDROCODONE-ACETAMINOPHEN) 1 two times a day as needed severe pain  #60 Each x 1   Entered and Authorized by:   Cindee Salt MD   Signed by:   Cindee Salt MD on 08/03/2010   Method used:   Print then Give to Patient   RxID:   [id number] METFORMIN HCL 1000 MG TABS (METFORMIN HCL) 1 tab by mouth two times a day before meals for diabetes  #180 x 3   Entered and Authorized by:   Cindee Salt MD   Signed by:   Cindee Salt MD on 08/03/2010   Method used:   Electronically to        [facility] [address] (retail)       [address], [address]       [place], [place]  [address]       Ph: [phone]       Fax: [phone]   RxID:   [id number] HYDROCHLOROTHIAZIDE 25 MG  TABS  (HYDROCHLOROTHIAZIDE) Take 1 tab by mouth every morning  #90 x 0   Entered and Authorized by:   Cindee Salt MD   Signed by:   Cindee Salt MD on 08/03/2010   Method used:   Electronically to        [facility] [address] (retail)       [address], [address]       [place], [place]  [address]       Ph: [phone]       Fax: [phone]   RxID:   [id number] DIOVAN HCT 160-25 MG TABS (VALSARTAN-HYDROCHLOROTHIAZIDE) 1 tablet daily for high blood pressure  #90 x 3   Entered and Authorized by:   Cindee Salt MD   Signed by:   Cindee Salt MD on 08/03/2010   Method used:   Faxed to ...       [facility] (mail-order)             ,  [place]         Ph: [phone]       Fax: [phone]   RxID:   [id number]   Current Allergies (reviewed today): ! LOSARTAN POTASSIUM-HCTZ (LOSARTAN POTASSIUM-HCTZ) ZOCOR (SIMVASTATIN) AMARYL (GLIMEPIRIDE) * DOXYCYCLINE AVANDIA (ROSIGLITAZONE MALEATE) DOXAZOSIN MESYLATE (DOXAZOSIN MESYLATE) GLUCOTROL XL (GLIPIZIDE)

## 2011-01-05 NOTE — Progress Notes (Signed)
Summary: VICODIN  Phone Note Refill Request Message from:  [facility] [phone] on March 02, 2010 8:50 AM  Refills Requested: Medication #1:  VICODIN 5-500 MG  TABS 1 two times a day as needed severe pain   Last Refilled: 01/05/2010 E-Scribe Request    Method Requested: Telephone to Pharmacy Initial call taken by: Mervin Hack CMA Duncan Dull),  March 02, 2010 8:50 AM  Follow-up for Phone Call        okay #60 x 1 Follow-up by: Cindee Salt MD,  March 02, 2010 3:04 PM    Prescriptions: VICODIN 5-500 MG  TABS (HYDROCODONE-ACETAMINOPHEN) 1 two times a day as needed severe pain  #60 Each x 1   Entered by:   Mervin Hack CMA (AAMA)   Authorized by:   Cindee Salt MD   Signed by:   Mervin Hack CMA (AAMA) on 03/02/2010   Method used:   Telephoned to ...       [facility] [address] (retail)       [address]       [place], [place]  [address]       Ph: [phone]       Fax: [phone]   RxID:   [id number]

## 2011-01-05 NOTE — Progress Notes (Signed)
Summary: patient complaining  Phone Note Call from Patient Call back at Home Phone [phone]   Caller: Patient Call For: Cindee Salt MD Summary of Call: Patient called back complaining about our phone system. He says that he has been trying to call for a week straight and could never get through to anyone. He left a message on Friday of last week regarding his bp meds. He was upset that he never heard anything back. I explained to him that we had tried to call him several times and could not get through and that a letter was mailed yesterday. I let him know that [facility].  Initial call taken by: Melody Comas,  June 10, 2010 10:36 AM  Follow-up for Phone Call        Thanks  Jamesetta So, see if you can see what happened and smooth things over Follow-up by: Cindee Salt MD,  June 10, 2010 5:01 PM  Additional Follow-up for Phone Call Additional follow up Details #1::        While on the phone talking with patient about his meds I questioned him about his complaint.  Patient stated that he tried to call in one day and could not get his phone call to go thru to anyone and could not even get voicemail. Explained to patient that we have recently switched over to a new system and occassionally have had problems, but hopefully everything has been taken care of at this point. Patient stated that when he returned my call today he had no problems getting to talk with a person. Patient said that he just wanted the office to be aware of the problem that he had and is okay now. Sydell Axon LPN  July 09, 2010 10:17 AM     Additional Follow-up for Phone Call Additional follow up Details #2::    Noted.Daine Gip  July 17, 2010 10:22 AM  Follow-up by: Daine Gip,  July 17, 2010 10:22 AM

## 2011-01-05 NOTE — Progress Notes (Signed)
Summary: VICODIN  Phone Note [facility]ill Request Message from:  [facility] [phone] on July 31, 2010 3:47 PM  Refills Requested: Medication #1:  VICODIN 5-500 MG  TABS 1 two times a day as needed severe pain   Last Refilled: 06/05/2010 E-Scribe Request    Method Requested: Telephone to Pharmacy Initial call taken by: Mervin Hack CMA Duncan Dull),  July 31, 2010 3:47 PM  Follow-up for Phone Call        patient in office today. Follow-up by: Mervin Hack CMA (AAMA),  August 03, 2010 11:22 AM

## 2011-02-24 ENCOUNTER — Other Ambulatory Visit: Payer: Self-pay | Admitting: *Deleted

## 2011-02-24 MED ORDER — HYDROCODONE-ACETAMINOPHEN 5-500 MG PO CAPS: 1.0000 | ORAL_CAPSULE | Freq: Four times a day (QID) | ORAL | Status: AC | PRN

## 2011-02-24 NOTE — Telephone Encounter (Signed)
Refill request from [facility]. [phone]. Fax is on your desk.

## 2011-02-24 NOTE — Telephone Encounter (Signed)
duplicate

## 2011-02-24 NOTE — Telephone Encounter (Signed)
Rx has been called in to the pharmacy. 

## 2011-02-24 NOTE — Telephone Encounter (Signed)
Okay #60 x 1 

## 2011-04-20 NOTE — Assessment & Plan Note (Signed)
[facility]                         GASTROENTEROLOGY OFFICE NOTE   NAME:[name], [name]                 MRN:          [id number]  DATE:04/11/2007                            DOB:          August 16, 1951    PROBLEM:  Abdominal pain.   [name] [name] has returned for evaluation of above.  For a couple of  months he has been complaining of right lower abdominal pain.  It is  unrelated to eating or bowel movements.  It is worse when he does knee  exercises.  Colonoscopy on February 03, 2007 demonstrated a diminutive  ascending colon polyp.  Biopsies showed adenomatous changes.  She tried  Naprosyn without relief.  He took this for only a few days.  He denies  urinary frequency or dysuria.   MEDICATIONS:  Include metformin, Actos, Diovan, and Diazide.   HE IS ALLERGIC TO:  1. TETRACYCLINE.  2. DOXYCYCLINE.   EXAMINATION:  Pulse 60.  Blood pressure 124/74.  Weight 230.  HEENT: EOMI. PERRLA. Sclerae are anicteric.  Conjunctivae are pink.  NECK:  Supple without thyromegaly, adenopathy or carotid bruits.  CHEST:  Clear to auscultation and percussion without adventitious  sounds.  CARDIAC:  Regular rhythm; normal S1 S2.  There are no murmurs, gallops  or rubs.  ABDOMEN:  There is point tenderness in the right lower quadrant just  slightly medial to the right inguinal area.  There are no frank hernias.  Pain is not affected by abdominal muscle contraction.  There are  abdominal masses or organomegaly.  EXTREMITIES:  Full range of motion.  No cyanosis, clubbing or edema.  RECTAL:  There are no masses.  Stool is Hemoccult negative.   IMPRESSION:  Probable abdominal wall pain.  A small incarcerated hernia  is possible, though I do not appreciate that by exam.   RECOMMENDATION:  Trial of Clinoril 200 mg twice a day for 10 days.  If  he is not improved, then I will obtain a CT of the abdomen and pelvis.     Barbette Hair. Arlyce Dice, MD,FACG  Electronically  Signed    RDK/MedQ  DD: 04/11/2007  DT: 04/11/2007  Job #: [id number]   cc:   Karie Schwalbe, MD

## 2011-04-23 NOTE — Discharge Summary (Signed)
[place]  Patient:    [name] [name], [name] [name]                 MRN: [id number] Adm. Date:  [id number] Disc. Date: [id number] Attending:  Duke Salvia CC:         Roxy Manns, M.D. [facility]   Discharge Summary  ADMITTING DIAGNOSIS:  Vertigo.  DISCHARGE DIAGNOSIS:  Vertigo, improved.  CONSULTANTS:  None.  PROCEDURES:  CT scan of the brain that was negative.  MRI scan of the brain showed no lesions or abnormalities.  MRA which showed no significant abnormalities but a question of a very small, less than 2 mm possible aneurysm, please see the report.  Discussed with radiologist and would recommend follow-up study in six months.  HISTORY OF PRESENT ILLNESS:  [name] is a 60 year old married white male who presented to the emergency department with the acute onset of severe vertigo with room spinning accompanying diaphoresis and nausea and vomiting. He was transported by his wife to the emergency department.  The patients vertigo was such that he was unable to walk.  His nausea and vomiting was intractable.  The patient had difficulty with focusing his eyes, and any movement made his vertigo worse.  The patient did report an episode three months prior to admission of transient, seconds, that he noted.  The patient had no prior history of labyrinthitis.  The patient does have question of bladder stones and is followed by Dr. Claudette Laws, M.D.  PAST MEDICAL HISTORY: 1. NIDDM for one year. 2. Hypertension. 3. Hiatal hernia. 4. History of nephrolithiasis. 5. History of sleep apnea.   PAST SURGICAL HISTORY 1. He is status post umbilical hernia repair. 2. Status post left knee arthroscopy.  MEDICATIONS AT ADMISSION: 1. Avandia 4 mg q.d. 2. Diovan 80 mg daily. 3. Hydrochlorothiazide 25 mg daily.  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  He is married and has three children.  FAMILY HISTORY:  Positive for  prostate cancer.  PHYSICAL EXAMINATION:  VITAL SIGNS:  Blood pressure 135/85, heart rate 98, respirations 20.  GENERAL APPEARANCE:  This is an overweight gentleman.  HEENT:  Unremarkable.  NECK:  Supple without thyromegaly.  CHEST:  Clear.  CARDIOVASCULAR:  2+ radial pulses with regular rate and rhythm without murmur, rub or gallops.  ABDOMEN:  Protuberant, firm, decreased breath sounds and lower quadrant tenderness.  NEUROLOGICAL:  The patient is alert and oriented to person, place, time, and context.  Cranial nerves 2-12 were grossly intact with the patient demonstrating lateral nystagmus, particularly with rightward gaze.  He had no vertical nystagmus.  Pupils, equal, round, reactive to light and accommodation.  He had normal facial symmetry and muscle movement.  LABORATORY DATA:  Hemoglobin 16.2, white count was 12,600, sodium 139, potassium 3.3, glucose 271.  HOSPITAL COURSE:  The patient was brought in for observation.  Over the 24 hour stay, the patients dizziness improved, although he did remain dizzy at the time of discharge.  He had some mild nausea and unfortunately when he got Phenergan IV, and it did make him somewhat agitated.  The patient did have an MRI scan which was unremarkable, as noted.  With the patients symptoms being markedly improved, vital signs being stable, he was felt to be stable and ready for discharge home.  DISPOSITION:  Discharged home.  DISCHARGE MEDICATIONS: 1. The patient will resume all of his home medications. 2. He will use Antivert 25 mg p.o. q.8h.  p.r.n. dizziness. 3. He will be given a prescription for Compazine to take for nausea on an    as-needed basis.  FOLLOW-UP:  The patient is to see Dr. Roxy Manns in one week.  He is to call the office for an appointment.  CONDITION ON DISCHARGE:  Stable and improved. DD:  07/16/00 TD:  07/17/00 Job: [id number]

## 2011-04-23 NOTE — Discharge Summary (Signed)
   NAME:  [name] [name], [name] [name] NO.:  [id number]   MEDICAL RECORD NO.:  [id number]                   PATIENT TYPE:  OBV   LOCATION:  0445                                 FACILITY:  [facility]   PHYSICIAN:  Claudette Laws, M.D.               DATE OF BIRTH:  1951/04/22   DATE OF ADMISSION:  08/11/2002  DATE OF DISCHARGE:  08/12/2002                                 DISCHARGE SUMMARY   HISTORY OF PRESENT ILLNESS:  This is a 60 year old man who presented with a  large obstructing proximal right ureteral stone.  He was seen in the office,  and then a double J stent was put up on 08/11/02 as an outpatient.  However,  because of severe sleep apnea and asthma, it was recommended that he be  observed overnight.   HOSPITAL COURSE:  The patient came in following the surgery, had a good  night, used the CPAP.  He was seen the next morning.  He was doing fine,  eating well, in no pain.  He was sent home with plans now to follow up with  lithotripsy on a timely basis.   LABORATORY DATA:  Electrolytes were normal except he was slightly azotemic  with a BUN of 26, creatinine of 2.0.  His white count was 10,900, hemoglobin  15.1, hematocrit 43.6.  EKG showed normal sinus rhythm.   FINAL DIAGNOSES:  1. A 1 cm proximal obstructing right ureteral calculus.  2. Renal colic and right hydronephrosis.  3. Past history of nephrolithiasis.  4. Hypertension.  5. Diabetes mellitus.  6. Sleep apnea.   OPERATION:  Cystoscopy, right retrograde pyelogram, and insertion of a 6  French 26 cm double J stent.   POSTOPERATIVE COMPLICATIONS:  None.   CONDITION ON DISCHARGE:  Improved.   DISCHARGE MEDICATIONS:  1. Hydrochlorothiazide 25 mg q.d.  2. Actos 30 mg q.d.  3. Glucophage 500 mg b.i.d.  4. Klorcon 10 mEq q.d.  5. Diovan 80 mg q.d.  6. Oxycodone p.r.n. pain.  7. Cipro 500 mg one b.i.d. #5.   DIET:  Regular, force fluids.    ACTIVITY:  Limited activity.   FOLLOWUP:  The  patient is to return to see [place] in a few days for lithotripsy.      Claudette Laws, M.D.                     Claudette Laws, M.D.    RFS/MEDQ  D:  08/12/2002  T:  08/12/2002  Job:  [id number]

## 2011-04-23 NOTE — Op Note (Signed)
   TNAMELABIB, CWYNAR NO.:  [id number]   MEDICAL RECORD NO.:  [id number]                   PATIENT TYPE:  OBV   LOCATION:  0445                                 [facility]ACILITY:  [facility]   PHYSICIAN:  Claudette Laws, M.D.               DATE OF BIRTH:  04/10/1951   DATE OF PROCEDURE:  08/11/2002  DATE OF DISCHARGE:                                 OPERATIVE REPORT   PREOPERATIVE DIAGNOSIS:  Large obstructing proximal right ureteral calculus.   POSTOPERATIVE DIAGNOSIS:  Large obstructing proximal right ureteral  calculus.   OPERATIONS:  1. Cystoscopy.  2. Right retrograde pyeloureterogram.  3. Insertion of a 6 French 26-cm double-J stent.   SURGEON:  Claudette Laws, M.D.   PROCEDURE:  The patient was prepped and draped in the dorsal lithotomy  position under intubated general anesthesia.  Cystoscopy was performed with  a 22 French rigid cystoscope.  He had a normal anterior urethra, a small  nonobstructing prostate, some anterior notching, and elevation of the  posterior lip.  The bladder itself was fairly smooth.  No tumors, no  calculi, no ureteral orifices.   Initially, I intubated the right ureteral orifice and with the 6 [place] open-  ended ureteral catheter over a .038 Glidewire using fluoroscopic control and  C-arm, a retrograde pyelogram was obtained showing mild right hydronephrosis  and a triangular-shaped stone in the proximal right ureter.  We were then  able to negotiate the Glidewire passed the stone and went into a lower pole  calyceal system.  We then passed up a 6 [place] 26-cm double-J stent without  a string.  This was positioned in the lower pole calyx.  The distal end was  curled up in the bladder.  The bladder was emptied.  All instruments were  removed.   A B&O suppository was placed per rectum for bladder spasms and 10 cc of  Xylocaine jelly per urethra for anesthetic purposes.   The patient was then extubated and taken back to  recovery room in  satisfactory condition.  Because of severe sleep apnea, GERD, hypertension  it was thought that we should observe him overnight.  We will try to keep  him off of pain medicine as much as possible, use a CPAP, and monitor him  with a pulse oximeter.                                               Claudette Laws, M.D.    RFS/MEDQ  D:  08/11/2002  T:  08/11/2002  Job:  [id number]

## 2011-04-23 NOTE — Op Note (Signed)
NAME:  [name] [name], [name] [name] NO.:  [id number]   MEDICAL RECORD NO.:  [id number]          PATIENT TYPE:  OIB   LOCATION:  2550                         FACILITY:  [facility]   PHYSICIAN:  Vania Rea. Supple, M.D.  DATE OF BIRTH:  [birth year]   DATE OF PROCEDURE:  02/23/2007  DATE OF DISCHARGE:                               OPERATIVE REPORT   PREOPERATIVE DIAGNOSES:  1. Right knee medial meniscus tear.   POSTOPERATIVE DIAGNOSES:  1. Right knee medial meniscus tear.  2. Chondromalacia of the patella.  3. 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  4. Thickened fibrotic symptomatic medial plica.   PROCEDURE:  1. Right knee diagnostic arthroscopy.  2. Extensive synovectomy including medial plica resection.  3. Chondroplasty of the patella.  4. Partial medial meniscectomy.  5. Microfracture treatment of full-thickness chondral defect on the      medial femoral condyle.   SURGEON:  Vania Rea. Supple, M.D.   Threasa HeadsFrench Ana A. Shuford, P.A.-C.   ANESTHESIA:  Local with IV sedation.   TOURNIQUET TIME:  None was used.   ESTIMATED BLOOD LOSS:  Minimal.   DRAINS:  None.   HISTORY:  This is a 60 year old gentleman who has had persistent right  medial knee pain mechanical symptoms refractory to prolonged attempts at  conservative management.  Clinical examination showed discrete medial  joint line tenderness with a positive McMurry's.  His radiographs show  some mild joint space narrowing.  Do to his ongoing pain and functional  limitations he is brought to the operating room at this time for planned  right knee arthroscopy as described below.   We preoperatively counseled the patient on treatment options as well as  risks versus benefits thereof.  Possible surgical complications  bleeding, infection, neurovascular injury, DVT, PE as well as persistent  pain reviewed.  He understands and accepts and agrees with our planned  procedure.   PROCEDURE IN DETAIL:  After undergoing routine preop evaluation, the  patient received prophylactic antibiotics.  A knee block anesthetic was  established in the holding area by the anesthesia department.  He  received prophylactic antibiotics.  Placed supine on the operating table  and right leg placed in leg holder and sterilely prepped and draped in  standard fashion.  Standard arthroscopy portals were established and  diagnostic arthroscopies performed.  The suprapatellar pouch and gutters  showed diffuse synovitis and there was a very thickened fibrotic medial  plica which was abrading the medial femoral condyle.  The plica was  excised back to its capsular margins and extensive synovectomy was  performed for significant proliferative overgrowth of the synovial  tissue encroaching upon the patellofemoral joint as well.  The patella  showed broad grade 3 chondromalacia over the central facette and  extending superiorly.  A shaver was introduced and used to perform an  extensive chondroplasty for large areas a very loose friable necrotic  cartilage.  There however were no obvious full-thickness chondral  defects on the patella.  Trochlear groove was in good condition.  Intercondylar notch showed the ACL to be intact.  Medially there was a  broad area of what appeared initially to be grade 3 chondromalacia on  the medial femoral condyle.  However on probing in this area we found  that there were several large flaps of loose cartilage and ultimately  found a full-thickness chondral defect approximately 2 cm in diameter.  We used a shaver to debride the margins of this chondral defect and  smooth the margins down to stable and healthy appearing cartilage  tissue.  The base of the defect was then aggressively debrided with a  shaver and then we used the microfracture picks to perform a  microfracture treatment of the area of exposed subchondral bone.  The  meniscus was probed and found to  have a complex degenerative tear  involving the middle and posterior thirds.  The meniscus was trimmed  back to a stable peripheral margin with the basket and shavers brought  in for final contouring and removal of meniscal fragments.  I would  estimate that approximately 50% of the medial meniscus was resected.  Probing in the remaining portions of the medial meniscus showed it to be  stable.  Laterally the meniscus was probed and found be stable and  articular surface were of good condition.  At this point final  inspection and irrigation was then completed.  Fluid and instruments  were removed.  A combination of Marcaine with epi and morphine was  instilled into the knee joint.  I should mention that we did supplement  the portals at the beginning of the case with 1% lidocaine with epi.  At  the closure of the case the portals were closed with Steri-Strips.  A  bulky dry dressing was wrapped about the right knee and leg was wrapped  with an Ace bandage and thigh high support stocking.  The patient was  then transferred to the recovery room in stable condition.      Vania Rea. Supple, M.D.  Electronically Signed     KMS/MEDQ  D:  02/23/2007  T:  02/23/2007  Job:  [id number]

## 2011-05-04 ENCOUNTER — Encounter: Payer: Self-pay | Admitting: Internal Medicine

## 2011-05-05 ENCOUNTER — Telehealth: Payer: Self-pay | Admitting: *Deleted

## 2011-05-05 ENCOUNTER — Ambulatory Visit (INDEPENDENT_AMBULATORY_CARE_PROVIDER_SITE_OTHER): Payer: BC Managed Care – PPO | Admitting: Internal Medicine

## 2011-05-05 ENCOUNTER — Encounter: Payer: Self-pay | Admitting: Internal Medicine

## 2011-05-05 ENCOUNTER — Ambulatory Visit (INDEPENDENT_AMBULATORY_CARE_PROVIDER_SITE_OTHER)
Admission: RE | Admit: 2011-05-05 | Discharge: 2011-05-05 | Disposition: A | Discharge status: Home / Self Care | Payer: BC Managed Care – PPO | Source: Ambulatory Visit | Attending: Internal Medicine | Admitting: Internal Medicine

## 2011-05-05 VITALS — BP 138/80 | HR 70 | Temp 98.7°F | Ht 66.0 in | Wt 234.0 lb

## 2011-05-05 DIAGNOSIS — R0609 Other forms of dyspnea: Secondary | ICD-10-CM

## 2011-05-05 DIAGNOSIS — R0600 Dyspnea, unspecified: Secondary | ICD-10-CM

## 2011-05-05 DIAGNOSIS — E785 Hyperlipidemia, unspecified: Secondary | ICD-10-CM

## 2011-05-05 DIAGNOSIS — Z0000 Encounter for general adult medical examination without abnormal findings: Secondary | ICD-10-CM | POA: Insufficient documentation

## 2011-05-05 DIAGNOSIS — M1990 Unspecified osteoarthritis, unspecified site: Secondary | ICD-10-CM

## 2011-05-05 DIAGNOSIS — E119 Type 2 diabetes mellitus without complications: Secondary | ICD-10-CM

## 2011-05-05 DIAGNOSIS — I10 Essential (primary) hypertension: Secondary | ICD-10-CM

## 2011-05-05 DIAGNOSIS — N400 Enlarged prostate without lower urinary tract symptoms: Secondary | ICD-10-CM

## 2011-05-05 DIAGNOSIS — R0989 Other specified symptoms and signs involving the circulatory and respiratory systems: Secondary | ICD-10-CM

## 2011-05-05 LAB — BASIC METABOLIC PANEL
Calcium: 9.1 mg/dL (ref 8.4–10.5)
Creatinine, Ser: 1 mg/dL (ref 0.4–1.5)

## 2011-05-05 LAB — CBC WITH DIFFERENTIAL/PLATELET
Basophils Relative: 1.7 % (ref 0.0–3.0)
Eosinophils Relative: 2.9 % (ref 0.0–5.0)
HCT: 44.8 % (ref 39.0–52.0)
Lymphs Abs: 2 10*3/uL (ref 0.7–4.0)
MCV: 89.8 fl (ref 78.0–100.0)
Monocytes Absolute: 0.7 10*3/uL (ref 0.1–1.0)
Neutrophils Relative %: 51.4 % (ref 43.0–77.0)
RBC: 4.98 Mil/uL (ref 4.22–5.81)
WBC: 5.9 10*3/uL (ref 4.5–10.5)

## 2011-05-05 LAB — TSH: TSH: 1.65 u[IU]/mL (ref 0.35–5.50)

## 2011-05-05 LAB — HEPATIC FUNCTION PANEL: Total Bilirubin: 0.6 mg/dL (ref 0.3–1.2)

## 2011-05-05 LAB — PSA: PSA: 0.5 ng/mL (ref 0.10–4.00)

## 2011-05-05 LAB — HEMOGLOBIN A1C: Hgb A1c MFr Bld: 8.7 % — ABNORMAL HIGH (ref 4.6–6.5)

## 2011-05-05 NOTE — Progress Notes (Signed)
Subjective:    Patient ID: [name] [name], male    DOB: 06-11-1951, 60 y.o.   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  , Disp: , Rfl: ;  metFORMIN (GLUCOPHAGE) 1000 MG tablet, Take 500 mg by mouth 2 (two) times daily with a meal.  , Disp: , Rfl: ;  pioglitazone (ACTOS) 30 MG tablet, Take 30 mg by mouth daily.  , Disp: , Rfl:  valsartan-hydrochlorothiazide (DIOVAN-HCT) 160-25 MG per tablet, Take 1 tablet by mouth daily.  , Disp: , Rfl:   Past Medical History  Diagnosis Date  . BPH (benign prostatic hypertrophy)   . Hx of colonic polyps   . Diabetes mellitus   . Hyperlipidemia   . Hypertension   . Cerebral aneurysm   . Carpal tunnel syndrome   . OSA (obstructive sleep apnea)   . Arthritis   . Kidney stones     Past Surgical History  Procedure Date  . Knee surgery     left  . Meniscus repair 03/08    right knee    Family History  Problem Relation Age of Onset  . Coronary artery disease Father   . Diabetes Neg Hx   . Hypertension Neg Hx   . Cancer Neg Hx     History   Social History  . Marital Status: Married    Spouse Name: N/A    Number of Children: 3  . Years of Education: N/A   Occupational History  . tool maker    Social History  Main Topics  . Smoking status: Never Smoker   . Smokeless tobacco: Not on file  . Alcohol Use: No  . Drug Use: Not on file  . Sexually Active: Not on file   Other Topics Concern  . Not on file   Social History Narrative  . No narrative on file   Review of Systems  Constitutional: Negative for fatigue and unexpected weight change.       Weight is stable Wears seat belt intermittently  HENT: Positive for congestion, rhinorrhea and dental problem. Negative for hearing loss and tinnitus.        Recent dental work---fillings revised  Eyes: Negative for visual disturbance.       Still having trouble getting glasses correct No diplopia or focal vision loss  Respiratory: Positive for cough and shortness of breath. Negative for chest tightness.        Very occ cough  Cardiovascular: Positive for palpitations and leg swelling.       Swelling if standing on concrete for extended times at work occ flutter--?cell phone there in pocket  Gastrointestinal: Negative for nausea, vomiting, constipation and blood in stool.       No sig heartburn  Genitourinary: Negative for dysuria, urgency and difficulty urinating.  No sexual problems  Musculoskeletal: Positive for arthralgias. Negative for back pain and joint swelling.       Uses the hydrocodone once a day in general Worst is right shoulder  Skin: Negative for color change and rash.       Gets recurrent spots on face--no bad now (actinics in past)  Neurological: Positive for dizziness and numbness. Negative for syncope, weakness, light-headedness and headaches.       Numbness and occ shooting pain in right thumb  Hematological: Negative for adenopathy. Does not bruise/bleed easily.  Psychiatric/Behavioral: Negative for sleep disturbance and dysphoric mood. The patient is not nervous/anxious.        Objective:   Physical Exam  Constitutional: He is oriented to person, place, and time. He appears well-developed and well-nourished. No  distress.  HENT:  Head: Normocephalic and atraumatic.  Right Ear: External ear normal.  Left Ear: External ear normal.  Mouth/Throat: Oropharynx is clear and moist. No oropharyngeal exudate.       TMs normal  Eyes: Conjunctivae and EOM are normal. Pupils are equal, round, and reactive to light.  Neck: Normal range of motion. Neck supple. No thyromegaly present.  Cardiovascular: Normal rate, regular rhythm, normal heart sounds and intact distal pulses.  Exam reveals no gallop.   No murmur heard. Pulmonary/Chest: Effort normal and breath sounds normal. No respiratory distress. He has no wheezes. He has no rales.  Abdominal: Soft. He exhibits no mass. There is no tenderness.       Protuberant Large ventral hernia due to diastasis recti  Musculoskeletal: Normal range of motion. He exhibits no edema and no tenderness.  Lymphadenopathy:    He has no cervical adenopathy.  Neurological: He is alert and oriented to person, place, and time.  Skin: Skin is warm. No rash noted.  Psychiatric: He has a normal mood and affect. His behavior is normal. Judgment and thought content normal.          Assessment & Plan:

## 2011-05-05 NOTE — Telephone Encounter (Signed)
Message copied by Sueanne Margarita on Wed May 05, 2011  5:48 PM ------      Message from: Tillman Abide I      Created: Wed May 05, 2011  4:05 PM       Please call      Diabetes control is worse, but not by that much. We can hold off on adding new medication but that will only be temporary unless he does a better job with his eating, exercises some and loses weight      Blood count, prostate, liver, kidney and thyroid tests are all normal

## 2011-05-11 ENCOUNTER — Encounter: Payer: Self-pay | Admitting: *Deleted

## 2011-05-11 NOTE — Telephone Encounter (Signed)
Mailed letter to patient's home address.

## 2011-05-14 ENCOUNTER — Telehealth: Payer: Self-pay | Admitting: *Deleted

## 2011-05-14 NOTE — Telephone Encounter (Signed)
Advised pt of lab results. He wants to hold off on more medicine for now, says he will work on diet, exercise and weight loss.

## 2011-05-14 NOTE — Telephone Encounter (Signed)
Yes, that is what I suggested It is up to him now

## 2011-06-16 ENCOUNTER — Other Ambulatory Visit: Payer: Self-pay | Admitting: *Deleted

## 2011-06-16 MED ORDER — HYDROCODONE-ACETAMINOPHEN 5-500 MG PO TABS: 1.0000 | ORAL_TABLET | Freq: Two times a day (BID) | ORAL | Status: DC | PRN

## 2011-06-16 NOTE — Telephone Encounter (Signed)
Okay #60 x 1 

## 2011-06-16 NOTE — Telephone Encounter (Signed)
rx faxed to pharmacy manually  

## 2011-06-16 NOTE — Telephone Encounter (Signed)
Fax is on your desk . 

## 2011-08-11 ENCOUNTER — Other Ambulatory Visit: Payer: Self-pay | Admitting: Internal Medicine

## 2011-08-30 ENCOUNTER — Other Ambulatory Visit: Payer: Self-pay | Admitting: Internal Medicine

## 2011-09-24 ENCOUNTER — Other Ambulatory Visit: Payer: Self-pay | Admitting: *Deleted

## 2011-09-24 MED ORDER — HYDROCODONE-ACETAMINOPHEN 5-500 MG PO TABS: 1.0000 | ORAL_TABLET | Freq: Two times a day (BID) | ORAL | Status: DC | PRN

## 2011-09-24 NOTE — Telephone Encounter (Signed)
Okay #[phone] x [phone] 

## 2011-11-01 ENCOUNTER — Other Ambulatory Visit: Payer: Self-pay | Admitting: Internal Medicine

## 2011-11-02 ENCOUNTER — Telehealth: Payer: Self-pay | Admitting: *Deleted

## 2011-11-03 ENCOUNTER — Encounter: Payer: Self-pay | Admitting: Internal Medicine

## 2011-11-03 ENCOUNTER — Ambulatory Visit (INDEPENDENT_AMBULATORY_CARE_PROVIDER_SITE_OTHER): Payer: BC Managed Care – PPO | Admitting: Internal Medicine

## 2011-11-03 DIAGNOSIS — R079 Chest pain, unspecified: Secondary | ICD-10-CM | POA: Insufficient documentation

## 2011-11-03 DIAGNOSIS — R0789 Other chest pain: Secondary | ICD-10-CM

## 2011-11-03 NOTE — Progress Notes (Signed)
Subjective:    Patient ID: [name] [name], male    DOB: Apr 12, 1951, 60 y.o.   MRN: [id number]  HPI Having pressure and pain across shoulders and chest Trouble with SOB as well Started about 6 weeks ago--noted it first trying to cut out roots to put in deck Now happening more often and worsening  Only with exertion  No diaphoresis or nausea with it Pressure and dyspnea will resolve within 3-4 minutes with rest occ comes right back Rarely will occur even without exertion  Had benign cardiolite (with injection) in 2009  Current Outpatient Prescriptions on File Prior to Visit  Medication Sig Dispense Refill  . DIOVAN HCT 160-25 MG per tablet TAKE 1 TABLET DAILY FOR HIGH BLOOD PRESSURE  90 tablet  2  . HYDROcodone-acetaminophen (VICODIN) 5-500 MG per tablet Take 1 tablet by mouth 2 (two) times daily as needed.  60 tablet  0  . metFORMIN (GLUCOPHAGE) 1000 MG tablet TAKE ONE TABLET BY MOUTH TWICE DAILY BEFORE MEALS FOR DIABETES  180 tablet  3  . pioglitazone (ACTOS) 30 MG tablet TAKE 1 TABLET DAILY  90 tablet  1    Allergies  Allergen Reactions  . Doxazosin Mesylate     REACTION: didn't work and may have caused SOB  . Doxycycline     REACTION: unspecified  . Glimepiride     REACTION: red eyes  . Glipizide     REACTION: myalgia??  . Losartan Potassium-Hctz     REACTION: chest \\T \ leg pain  . Rosiglitazone Maleate     REACTION: myalgia  . Simvastatin     REACTION: myalgias    Past Medical History  Diagnosis Date  . BPH (benign prostatic hypertrophy)   . Hx of colonic polyps   . Diabetes mellitus   . Hyperlipidemia   . Hypertension   . Cerebral aneurysm   . Carpal tunnel syndrome   . OSA (obstructive sleep apnea)   . Arthritis   . Kidney stones     Past Surgical History  Procedure Date  . Knee surgery     left  . Meniscus repair 03/08    right knee    Family History  Problem Relation Age of Onset  . Coronary artery disease Father   . Diabetes Neg Hx     . Hypertension Neg Hx   . Cancer Neg Hx     History   Social History  . Marital Status: Married    Spouse Name: N/A    Number of Children: 3  . Years of Education: N/A   Occupational History  . tool maker    Social History Main Topics  . Smoking status: Never Smoker   . Smokeless tobacco: Never Used  . Alcohol Use: No  . Drug Use: Not on file  . Sexually Active: Not on file   Other Topics Concern  . Not on file   Social History Narrative  . No narrative on file   Review of Systems Diabetes control is about the same Sleeps okay Weight is stable     Objective:   Physical Exam  Constitutional: He appears well-developed and well-nourished. No distress.  Eyes:       Dry skin on upper lids---discussed using OTC hydrocortisone  Neck: Normal range of motion. Neck supple. No thyromegaly present.  Cardiovascular: Normal rate, regular rhythm, normal heart sounds and intact distal pulses.  Exam reveals no gallop and no friction rub.   No murmur heard. Pulmonary/Chest: Effort normal. No respiratory  distress. He has no wheezes. He has no rales.  Musculoskeletal: He exhibits no edema and no tenderness.  Lymphadenopathy:    He has no cervical adenopathy.  Psychiatric: He has a normal mood and affect. His behavior is normal. Judgment and thought content normal.          Assessment & Plan:

## 2011-11-03 NOTE — Patient Instructions (Signed)
Please set up the cardiology referral

## 2011-11-03 NOTE — Assessment & Plan Note (Signed)
Classic history of exertional angina EKG shows sinus rhythm with T wave inversions that suggest anterolateral ischemia These changes are new since 2009 when he had a benign stress test  Will set up with cardiology Limit exertion till then May want to proceed directly to cath

## 2011-11-04 ENCOUNTER — Encounter: Payer: Self-pay | Admitting: *Deleted

## 2011-11-04 ENCOUNTER — Encounter: Payer: Self-pay | Admitting: Cardiovascular Disease

## 2011-11-04 ENCOUNTER — Ambulatory Visit (INDEPENDENT_AMBULATORY_CARE_PROVIDER_SITE_OTHER): Payer: BC Managed Care – PPO | Admitting: Cardiovascular Disease

## 2011-11-04 DIAGNOSIS — Z01810 Encounter for preprocedural cardiovascular examination: Secondary | ICD-10-CM

## 2011-11-04 DIAGNOSIS — E119 Type 2 diabetes mellitus without complications: Secondary | ICD-10-CM

## 2011-11-04 DIAGNOSIS — I10 Essential (primary) hypertension: Secondary | ICD-10-CM

## 2011-11-04 DIAGNOSIS — R079 Chest pain, unspecified: Secondary | ICD-10-CM

## 2011-11-04 DIAGNOSIS — R0602 Shortness of breath: Secondary | ICD-10-CM

## 2011-11-04 DIAGNOSIS — E785 Hyperlipidemia, unspecified: Secondary | ICD-10-CM

## 2011-11-04 MED ORDER — ATORVASTATIN CALCIUM 20 MG PO TABS: 20.0000 mg | ORAL_TABLET | Freq: Every day | ORAL | Status: DC

## 2011-11-04 MED ORDER — NITROGLYCERIN 0.4 MG SL SUBL: 0.4000 mg | SUBLINGUAL_TABLET | SUBLINGUAL | Status: DC | PRN

## 2011-11-04 NOTE — Patient Instructions (Addendum)
You are doing well. We will start lipitor 10 mg daily (1/2 pill) for one month After one month, consider increasing the lipitor  To a full pill (only if you have no muscle ache)  Take nitroglycerin for chest pain as needed  We will schedule a cardiac cath in [facility] next week  Your physician recommends that you schedule a follow-up appointment in: 2 weeks  Please call us if you have new issues that need to be addressed before your next appt.

## 2011-11-04 NOTE — Assessment & Plan Note (Signed)
He did have myalgias on simvastatin. We have suggested he try low-dose Lipitor. If he has recurrent myalgias, we would try Crestor 5 mg or low-dose pravastatin.

## 2011-11-04 NOTE — Assessment & Plan Note (Signed)
Symptoms are very concerning for unstable angina. He is starting to have symptoms at rest, worsening symptoms with exertion. He has several risk factors including poorly controlled diabetes and cholesterol, he is male above age 60. We discussed the various ways to evaluate his chest pain and he has elected to proceed with a cardiac catheterization. This has tentatively been scheduled for next week.  We'll increase his aspirin to 162 mg daily, we will start him on low-dose Lipitor 10 mg daily, and have given him nitroglycerin sublingual to take p.r.n. For worsening chest pain.

## 2011-11-04 NOTE — Assessment & Plan Note (Signed)
Blood pressure is well controlled on today's visit. No changes made to the medications. 

## 2011-11-04 NOTE — Assessment & Plan Note (Signed)
We have encouraged continued exercise, careful diet management in an effort to lose weight. Hemoglobin A1c greater than 8.

## 2011-11-04 NOTE — Progress Notes (Signed)
Patient ID: [name] [name], male    DOB: 1951-09-12, 60 y.o.   MRN: [id number]  HPI Comments: 60 year old gentleman with obesity, hyperlipidemia, diabetes, hypertension, presenting to our referral from Dr. Alphonsus Sias for chest pain.  He reports that approximately 6 weeks ago he was digging under a trailer For new posts for a deck. He was digging through heavy roots in the ground. He started developing chest and shoulder pain with exertion. Since then, his symptoms have recurred with exertion and are getting worse. He feels it is like a "bear hug" when he gets the chest pain. Symptoms are more intense, lasting longer. 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  He does report that his father had coronary artery disease. He tried simvastatin previously though had muscle ache. He is unable to treadmill secondary to severe knee pain. He reports having an adenosine Myoview in 2009 that showed no ischemia.  EKG shows normal sinus rhythm with rate 78 beats per minute with T wave abnormality in V2 through V5, one and aVL   Outpatient Encounter Prescriptions as of 11/04/2011  Medication Sig Dispense Refill  . DIOVAN HCT 160-25 MG per tablet TAKE 1 TABLET DAILY FOR HIGH BLOOD PRESSURE  90 tablet  2  . HYDROcodone-acetaminophen (VICODIN) 5-500 MG per tablet Take 1 tablet by mouth 2 (two) times daily as needed.  60 tablet  0  . metFORMIN (GLUCOPHAGE) 1000 MG tablet Take 500 mg by mouth 2 (two) times daily with a meal.       . pioglitazone (ACTOS) 30 MG tablet TAKE 1 TABLET DAILY  90 tablet  1  . aspirin 81 MG tablet Take 81 mg by mouth daily.        Marland Kitchen DISCONTD: metFORMIN (GLUCOPHAGE) 1000 MG tablet TAKE ONE TABLET BY MOUTH TWICE DAILY BEFORE MEALS FOR DIABETES  180 tablet  3     Review of Systems  Constitutional: Negative.   HENT: Negative.   Eyes: Negative.   Respiratory: Negative.   Cardiovascular:  Positive for chest pain.  Gastrointestinal: Negative.   Musculoskeletal: Negative.   Skin: Negative.   Neurological: Negative.   Hematological: Negative.   Psychiatric/Behavioral: Negative.   All other systems reviewed and are negative.    BP 130/70  Pulse 78  Ht 5\' 5"  (1.651 m)  Wt 233 lb 8 oz (105.915 kg)  BMI 38.86 kg/m2  Physical Exam  Nursing note and vitals reviewed. Constitutional: He is oriented to person, place, and time. He appears well-developed and well-nourished.  HENT:  Head: Normocephalic.  Nose: Nose normal.  Mouth/Throat: Oropharynx is clear and moist.  Eyes: Conjunctivae are normal. Pupils are equal, round, and reactive to light.  Neck: Normal range of motion. Neck supple. No JVD present.  Cardiovascular: Normal rate, regular rhythm, S1 normal, S2 normal, normal heart sounds and intact distal pulses.  Exam reveals no gallop and no friction rub.   No murmur heard. Pulmonary/Chest: Effort normal and breath sounds normal. No respiratory distress. He has no wheezes. He has no rales. He exhibits no tenderness.  Abdominal: Soft. Bowel sounds are normal. He exhibits no distension. There is no tenderness.  Musculoskeletal: Normal range of motion. He exhibits no edema and no tenderness.  Lymphadenopathy:    He has no cervical adenopathy.  Neurological: He is alert and oriented to person, place, and time. Coordination normal.  Skin: Skin is warm  and dry. No rash noted. No erythema.  Psychiatric: He has a normal mood and affect. His behavior is normal. Judgment and thought content normal.           Assessment and Plan

## 2011-11-05 ENCOUNTER — Ambulatory Visit: Payer: BC Managed Care – PPO | Admitting: Internal Medicine

## 2011-11-05 LAB — CBC WITH DIFFERENTIAL
Basophils Absolute: 0.1 10*3/uL (ref 0.0–0.2)
Eosinophils Absolute: 0.1 10*3/uL (ref 0.0–0.4)
HCT: 45.7 % (ref 37.5–51.0)
Immature Granulocytes: 0 % (ref 0–2)
Lymphocytes Absolute: 2.1 10*3/uL (ref 0.7–4.5)
MCHC: 35 g/dL (ref 31.5–35.7)
MCV: 85 fL (ref 79–97)
Monocytes Absolute: 0.7 10*3/uL (ref 0.1–1.0)
Neutrophils Absolute: 4 10*3/uL (ref 1.8–7.8)
Platelets: 240 10*3/uL (ref 140–415)
RDW: 13.1 % (ref 12.3–15.4)

## 2011-11-05 LAB — BASIC METABOLIC PANEL
BUN/Creatinine Ratio: 18 (ref 10–22)
BUN: 16 mg/dL (ref 8–27)
CO2: 21 mmol/L (ref 20–32)
Calcium: 9.2 mg/dL (ref 8.6–10.2)
Chloride: 100 mmol/L (ref 97–108)
Creatinine, Ser: 0.89 mg/dL (ref 0.76–1.27)
Sodium: 136 mmol/L (ref 134–144)

## 2011-11-06 LAB — CBC WITH DIFFERENTIAL
Basophils Absolute: 0 10*3/uL (ref 0.0–0.2)
Eos: 7 % (ref 0–7)
Eosinophils Absolute: 0.3 10*3/uL (ref 0.0–0.4)
Immature Granulocytes: 0 % (ref 0–2)
Lymphocytes Absolute: 1.2 10*3/uL (ref 0.7–4.5)
MCH: 29.8 pg (ref 26.6–33.0)
MCHC: 34.2 g/dL (ref 31.5–35.7)
MCV: 87 fL (ref 79–97)
Monocytes Absolute: 0.5 10*3/uL (ref 0.1–1.0)
Platelets: 163 10*3/uL (ref 140–415)
RDW: 13.4 % (ref 12.3–15.4)

## 2011-11-09 ENCOUNTER — Other Ambulatory Visit: Payer: Self-pay | Admitting: Cardiovascular Disease

## 2011-11-09 DIAGNOSIS — I2510 Atherosclerotic heart disease of native coronary artery without angina pectoris: Secondary | ICD-10-CM

## 2011-11-10 ENCOUNTER — Encounter (HOSPITAL_BASED_OUTPATIENT_CLINIC_OR_DEPARTMENT_OTHER)
Admission: RE | Disposition: A | Discharge status: Hospice | Payer: Self-pay | Source: Ambulatory Visit | Attending: Cardiovascular Disease

## 2011-11-10 ENCOUNTER — Encounter (HOSPITAL_BASED_OUTPATIENT_CLINIC_OR_DEPARTMENT_OTHER): Payer: Self-pay | Admitting: Cardiovascular Disease

## 2011-11-10 ENCOUNTER — Inpatient Hospital Stay (HOSPITAL_COMMUNITY)
Admission: AD | Admit: 2011-11-10 | Discharge: 2011-11-17 | DRG: 107 | Disposition: A | Discharge status: Home / Self Care | Payer: BC Managed Care – PPO | Source: Ambulatory Visit | Attending: Cardiothoracic Surgery | Admitting: Cardiothoracic Surgery

## 2011-11-10 ENCOUNTER — Other Ambulatory Visit: Payer: Self-pay

## 2011-11-10 ENCOUNTER — Inpatient Hospital Stay (HOSPITAL_BASED_OUTPATIENT_CLINIC_OR_DEPARTMENT_OTHER)
Admission: RE | Admit: 2011-11-10 | Discharge: 2011-11-10 | Disposition: A | Discharge status: Hospice | Payer: BC Managed Care – PPO | Source: Ambulatory Visit | Attending: Cardiovascular Disease | Admitting: Cardiovascular Disease

## 2011-11-10 DIAGNOSIS — N400 Enlarged prostate without lower urinary tract symptoms: Secondary | ICD-10-CM | POA: Diagnosis present

## 2011-11-10 DIAGNOSIS — I2510 Atherosclerotic heart disease of native coronary artery without angina pectoris: Principal | ICD-10-CM | POA: Diagnosis present

## 2011-11-10 DIAGNOSIS — Z8249 Family history of ischemic heart disease and other diseases of the circulatory system: Secondary | ICD-10-CM

## 2011-11-10 DIAGNOSIS — Z860100 Personal history of colon polyps, unspecified: Secondary | ICD-10-CM

## 2011-11-10 DIAGNOSIS — D62 Acute posthemorrhagic anemia: Secondary | ICD-10-CM | POA: Diagnosis not present

## 2011-11-10 DIAGNOSIS — E119 Type 2 diabetes mellitus without complications: Secondary | ICD-10-CM | POA: Insufficient documentation

## 2011-11-10 DIAGNOSIS — R0602 Shortness of breath: Secondary | ICD-10-CM | POA: Insufficient documentation

## 2011-11-10 DIAGNOSIS — G4733 Obstructive sleep apnea (adult) (pediatric): Secondary | ICD-10-CM | POA: Diagnosis present

## 2011-11-10 DIAGNOSIS — I10 Essential (primary) hypertension: Secondary | ICD-10-CM | POA: Insufficient documentation

## 2011-11-10 DIAGNOSIS — Z87442 Personal history of urinary calculi: Secondary | ICD-10-CM

## 2011-11-10 DIAGNOSIS — Z7982 Long term (current) use of aspirin: Secondary | ICD-10-CM

## 2011-11-10 DIAGNOSIS — R079 Chest pain, unspecified: Secondary | ICD-10-CM | POA: Insufficient documentation

## 2011-11-10 DIAGNOSIS — Z79899 Other long term (current) drug therapy: Secondary | ICD-10-CM

## 2011-11-10 DIAGNOSIS — K5900 Constipation, unspecified: Secondary | ICD-10-CM | POA: Diagnosis not present

## 2011-11-10 DIAGNOSIS — E785 Hyperlipidemia, unspecified: Secondary | ICD-10-CM | POA: Insufficient documentation

## 2011-11-10 DIAGNOSIS — E8779 Other fluid overload: Secondary | ICD-10-CM | POA: Diagnosis not present

## 2011-11-10 DIAGNOSIS — I25118 Atherosclerotic heart disease of native coronary artery with other forms of angina pectoris: Secondary | ICD-10-CM | POA: Clinically undetermined

## 2011-11-10 DIAGNOSIS — E669 Obesity, unspecified: Secondary | ICD-10-CM | POA: Insufficient documentation

## 2011-11-10 HISTORY — DX: Shortness of breath: R06.02

## 2011-11-10 HISTORY — DX: Atherosclerotic heart disease of native coronary artery without angina pectoris: I25.10

## 2011-11-10 HISTORY — DX: Gastro-esophageal reflux disease without esophagitis: K21.9

## 2011-11-10 HISTORY — DX: Angina pectoris, unspecified: I20.9

## 2011-11-10 LAB — GLUCOSE, CAPILLARY
Glucose-Capillary: 244 mg/dL — ABNORMAL HIGH (ref 70–99)
Glucose-Capillary: 288 mg/dL — ABNORMAL HIGH (ref 70–99)

## 2011-11-10 LAB — POCT I-STAT GLUCOSE
Glucose, Bld: 220 mg/dL — ABNORMAL HIGH (ref 70–99)
Operator id: 221371

## 2011-11-10 SURGERY — JV LEFT HEART CATHETERIZATION WITH CORONARY ANGIOGRAM
Anesthesia: Moderate Sedation

## 2011-11-10 MED ORDER — OLMESARTAN MEDOXOMIL 20 MG PO TABS
20.0000 mg | ORAL_TABLET | Freq: Every day | ORAL | Status: DC
  Administered 2011-11-11: 20 mg via ORAL
  Filled 2011-11-10 (×2): qty 1

## 2011-11-10 MED ORDER — ACETAMINOPHEN 325 MG PO TABS: 650.0000 mg | ORAL_TABLET | ORAL | Status: DC | PRN

## 2011-11-10 MED ORDER — HYDROCODONE-ACETAMINOPHEN 5-500 MG PO TABS: 1.0000 | ORAL_TABLET | Freq: Four times a day (QID) | ORAL | Status: DC | PRN

## 2011-11-10 MED ORDER — INSULIN ASPART 100 UNIT/ML ~~LOC~~ SOLN
0.0000 [IU] | Freq: Three times a day (TID) | SUBCUTANEOUS | Status: DC
  Administered 2011-11-11 (×2): 3 [IU] via SUBCUTANEOUS
  Administered 2011-11-11: 5 [IU] via SUBCUTANEOUS
  Filled 2011-11-10: qty 3

## 2011-11-10 MED ORDER — ASPIRIN 81 MG PO CHEW
324.0000 mg | CHEWABLE_TABLET | Freq: Once | ORAL | Status: AC
  Administered 2011-11-10: 324 mg via ORAL

## 2011-11-10 MED ORDER — HEPARIN SOD (PORCINE) IN D5W 100 UNIT/ML IV SOLN: 1050.0000 [IU]/h | INTRAVENOUS | Status: DC

## 2011-11-10 MED ORDER — ONDANSETRON HCL 4 MG/2ML IJ SOLN: 4.0000 mg | Freq: Four times a day (QID) | INTRAMUSCULAR | Status: DC | PRN

## 2011-11-10 MED ORDER — HYDROCHLOROTHIAZIDE 25 MG PO TABS
25.0000 mg | ORAL_TABLET | Freq: Every day | ORAL | Status: DC
  Administered 2011-11-11: 25 mg via ORAL
  Filled 2011-11-10 (×2): qty 1

## 2011-11-10 MED ORDER — NITROGLYCERIN 0.4 MG SL SUBL: 0.4000 mg | SUBLINGUAL_TABLET | SUBLINGUAL | Status: DC | PRN

## 2011-11-10 MED ORDER — SODIUM CHLORIDE 0.9 % IJ SOLN: 3.0000 mL | INTRAMUSCULAR | Status: DC | PRN

## 2011-11-10 MED ORDER — ASPIRIN 81 MG PO TABS: 81.0000 mg | ORAL_TABLET | Freq: Every day | ORAL | Status: DC

## 2011-11-10 MED ORDER — SODIUM CHLORIDE 0.9 % IV SOLN: 250.0000 mL | INTRAVENOUS | Status: DC | PRN

## 2011-11-10 MED ORDER — ZOLPIDEM TARTRATE 5 MG PO TABS: 5.0000 mg | ORAL_TABLET | Freq: Every evening | ORAL | Status: DC | PRN

## 2011-11-10 MED ORDER — NON FORMULARY: 10.0000 mg | Freq: Every day | Status: DC

## 2011-11-10 MED ORDER — SODIUM CHLORIDE 0.9 % IV SOLN
INTRAVENOUS | Status: DC
  Administered 2011-11-10: 11:00:00 via INTRAVENOUS

## 2011-11-10 MED ORDER — ASPIRIN 81 MG PO CHEW: 324.0000 mg | CHEWABLE_TABLET | ORAL | Status: DC

## 2011-11-10 MED ORDER — PIOGLITAZONE HCL 30 MG PO TABS
30.0000 mg | ORAL_TABLET | Freq: Every day | ORAL | Status: DC
  Administered 2011-11-11: 30 mg via ORAL
  Filled 2011-11-10 (×3): qty 1

## 2011-11-10 MED ORDER — ALPRAZOLAM 0.25 MG PO TABS: 0.2500 mg | ORAL_TABLET | Freq: Two times a day (BID) | ORAL | Status: DC | PRN

## 2011-11-10 MED ORDER — VALSARTAN-HYDROCHLOROTHIAZIDE 160-25 MG PO TABS: 1.0000 | ORAL_TABLET | Freq: Every day | ORAL | Status: DC

## 2011-11-10 MED ORDER — EZETIMIBE 10 MG PO TABS
10.0000 mg | ORAL_TABLET | Freq: Every day | ORAL | Status: DC
  Administered 2011-11-11 – 2011-11-14 (×3): 10 mg via ORAL
  Filled 2011-11-10 (×5): qty 1

## 2011-11-10 MED ORDER — ATORVASTATIN CALCIUM 10 MG PO TABS
20.0000 mg | ORAL_TABLET | Freq: Every day | ORAL | Status: DC
  Administered 2011-11-13 – 2011-11-14 (×2): 20 mg via ORAL
  Filled 2011-11-10 (×6): qty 2

## 2011-11-10 MED ORDER — PIOGLITAZONE HCL 30 MG PO TABS: 30.0000 mg | ORAL_TABLET | Freq: Every day | ORAL | Status: DC

## 2011-11-10 MED ORDER — SODIUM CHLORIDE 0.9 % IJ SOLN: 3.0000 mL | Freq: Two times a day (BID) | INTRAMUSCULAR | Status: DC

## 2011-11-10 MED ORDER — SIMVASTATIN 40 MG PO TABS: 40.0000 mg | ORAL_TABLET | Freq: Every day | ORAL | Status: DC

## 2011-11-10 MED ORDER — ROSUVASTATIN CALCIUM 20 MG PO TABS: 20.0000 mg | ORAL_TABLET | Freq: Every day | ORAL | Status: DC

## 2011-11-10 MED ORDER — HEPARIN (PORCINE) IN NACL 100-0.45 UNIT/ML-% IJ SOLN
1800.0000 [IU]/h | INTRAMUSCULAR | Status: DC
  Administered 2011-11-10: 1050 [IU]/h via INTRAVENOUS
  Administered 2011-11-11 (×2): 1600 [IU]/h via INTRAVENOUS
  Filled 2011-11-10 (×5): qty 250

## 2011-11-10 MED ORDER — ASPIRIN 81 MG PO CHEW
81.0000 mg | CHEWABLE_TABLET | Freq: Every day | ORAL | Status: DC
  Administered 2011-11-11: 81 mg via ORAL
  Filled 2011-11-10 (×2): qty 1

## 2011-11-10 MED ORDER — METOPROLOL TARTRATE 25 MG PO TABS
25.0000 mg | ORAL_TABLET | Freq: Two times a day (BID) | ORAL | Status: DC
  Administered 2011-11-10 – 2011-11-11 (×3): 25 mg via ORAL
  Filled 2011-11-10 (×5): qty 1

## 2011-11-10 MED ORDER — HYDROCODONE-ACETAMINOPHEN 5-325 MG PO TABS
1.0000 | ORAL_TABLET | Freq: Two times a day (BID) | ORAL | Status: DC | PRN
  Administered 2011-11-10 – 2011-11-12 (×3): 1 via ORAL
  Filled 2011-11-10 (×3): qty 1

## 2011-11-10 MED ORDER — ASPIRIN EC 81 MG PO TBEC: 81.0000 mg | DELAYED_RELEASE_TABLET | Freq: Every day | ORAL | Status: DC

## 2011-11-10 NOTE — Progress Notes (Signed)
ANTICOAGULATION CONSULT NOTE - Initial Consult  Pharmacy Consult for Heparin Indication: Anticoagulation s/p cath while awaiting PCI vs. CABG plans  Allergies  Allergen Reactions  . Doxazosin Mesylate     REACTION: didn't work and may have caused SOB  . Doxycycline     REACTION: unspecified  . Glimepiride     REACTION: red eyes  . Glipizide     REACTION: myalgia??  . Losartan Potassium-Hctz     REACTION: chest \\T \ leg pain  . Rosiglitazone Maleate     REACTION: myalgia  . Simvastatin     REACTION: myalgias    Patient Measurements:   Heparin Dosing Weight: 85.6 kg  Vital Signs: BP: 123/74 mmHg (12/05 1430) Pulse Rate: 70  (12/05 1430)  Labs: No results found for this basename: HGB:2,HCT:3,PLT:3,APTT:3,LABPROT:3,INR:3,HEPARINUNFRC:3,CREATININE:3,CKTOTAL:3,CKMB:3,TROPONINI:3 in the last 72 hours The CrCl is unknown because both a height and weight (above a minimum accepted value) are required for this calculation.  Medical History: Past Medical History  Diagnosis Date  . BPH (benign prostatic hypertrophy)   . Hx of colonic polyps   . Diabetes mellitus   . Hyperlipidemia   . Hypertension   . Cerebral aneurysm   . Carpal tunnel syndrome   . OSA (obstructive sleep apnea)   . Arthritis   . Kidney stones     Medications:  Prescriptions prior to admission  Medication Sig Dispense Refill  . aspirin 81 MG tablet Take 2 tablets (162 mg total) by mouth daily.      Marland Kitchen atorvastatin (LIPITOR) 20 MG tablet Take 1 tablet (20 mg total) by mouth daily.  30 tablet  11  . DISCONTD: DIOVAN HCT 160-25 MG per tablet TAKE 1 TABLET DAILY FOR HIGH BLOOD PRESSURE  90 tablet  2  . DISCONTD: metFORMIN (GLUCOPHAGE) 1000 MG tablet Take 500 mg by mouth 2 (two) times daily with a meal.       . DISCONTD: pioglitazone (ACTOS) 30 MG tablet TAKE 1 TABLET DAILY  90 tablet  1  . HYDROcodone-acetaminophen (VICODIN) 5-500 MG per tablet Take 1 tablet by mouth 2 (two) times daily as needed.  60 tablet  0   . nitroGLYCERIN (NITROSTAT) 0.4 MG SL tablet Place 1 tablet (0.4 mg total) under the tongue every 5 (five) minutes as needed for chest pain.  25 tablet  3   Scheduled:    . aspirin  324 mg Oral Once  . aspirin EC  81 mg Oral Daily  . pioglitazone  30 mg Oral Daily  . rosuvastatin  20 mg Oral Daily  . sodium chloride  3 mL Intravenous Q12H  . valsartan-hydrochlorothiazide  1 tablet Oral Daily  . DISCONTD: aspirin  324 mg Oral Pre-Cath  . DISCONTD: simvastatin  40 mg Oral Daily    Assessment: 60 y.o. M s/p cath today to resume heparin 6 hours post sheath removal while awaiting PCI vs. CABG plans. The sheath was removed at 1300 today--will resume heparin at 1900. SCr: 0.89, CrCl~80-100 ml/min.   Goal of Therapy:  Heparin level 0.3-0.7 units/ml   Plan:  1. No heparin bolus 2. Initiate heparin drip at rate of 1050 units/hr (10.5 ml/hr) 3. Will continue to monitor for any signs/symptoms of bleeding and will follow up with heparin level in 6 hours   Rolley Sims 11/10/2011,2:33 PM

## 2011-11-10 NOTE — Consult Note (Signed)
[address]            [place] [address]          [phone]       [name] [name] [facility] Medical Record #[id number] Date of Birth: 04-30-1951  Referring: Dr Lewie Loron Primary Care: Tillman Abide, MD, MD  Chief Complaint:  Chest Pain  History of Present Illness:     He reports that approximately 6 weeks ago he was digging under a trailer For new posts for a deck. He was digging through heavy roots in the ground. He started developing chest and shoulder pain with exertion. Since then, his symptoms have recurred with exertion and are getting worse. He feels it is like a "bear hug" when he gets the chest pain. Symptoms are more intense, lasting longer. 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     Current Activity/ Functional Status: No limitations   Past Medical History  Diagnosis Date  . BPH (benign prostatic hypertrophy)   . Hx of colonic polyps   . Diabetes mellitus   . Hyperlipidemia   . Hypertension   . Cerebral aneurysm Negative MRI of Head 2008  . Carpal tunnel syndrome   . OSA (obstructive sleep apnea)   . Arthritis   . Kidney stones     Past Surgical History  Procedure Date  . Knee surgery     left  . Meniscus repair 03/08    right knee    History  Smoking status  . Never Smoker   Smokeless tobacco  . Never Used    History  Alcohol Use No    History   Social History  . Marital Status: Married    Spouse Name: N/A    Number of Children: 3   Occupational History  . tool maker        Allergies  Allergen Reactions  . Doxazosin Mesylate     REACTION: didn't work and may have caused SOB  . Doxycycline     REACTION: unspecified  . Glimepiride     REACTION: red eyes  . Glipizide     REACTION: myalgia??  . Losartan Potassium-Hctz     REACTION: chest \\T \ leg pain  . Rosiglitazone Maleate     REACTION:  myalgia  . Simvastatin     REACTION: myalgias   Prescriptions prior to admission  Medication Sig Dispense Refill  . aspirin 81 MG tablet Take 2 tablets (162 mg total) by mouth daily.      Marland Kitchen atorvastatin (LIPITOR) 20 MG tablet Take 20 mg by mouth daily.        Marland Kitchen HYDROcodone-acetaminophen (VICODIN) 5-500 MG per tablet Take 1 tablet by mouth 2 (two) times daily as needed. For pain.       . nitroGLYCERIN (NITROSTAT) 0.4 MG SL tablet Place 0.4 mg under the tongue every 5 (five) minutes as needed. For chest pain.       Marland Kitchen DISCONTD: atorvastatin (LIPITOR) 20 MG tablet Take 1 tablet (20 mg total) by mouth daily.  30 tablet  11  . DISCONTD: HYDROcodone-acetaminophen (VICODIN) 5-500 MG per tablet Take 1 tablet by mouth 2 (two) times daily as needed.  60 tablet  0  . DISCONTD: nitroGLYCERIN (NITROSTAT) 0.4 MG SL tablet Place 1  tablet (0.4 mg total) under the tongue every 5 (five) minutes as needed for chest pain.  25 tablet  3   Current Facility-Administered Medications   Facility-Administered Medications Ordered in Other Encounters  Medication Dose Route Frequency Provider Last Rate Last Dose  . aspirin chewable tablet 324 mg  324 mg Oral Once Antonieta Iba, MD   324 mg at 11/10/11 1039      Family History  Problem Relation Age of Onset  . Coronary artery disease Father   . Diabetes Neg Hx   . Hypertension Neg Hx   . Cancer Neg Hx      Review of Systems:     Cardiac Review of Systems: Y or N  Chest Pain [  y  ]  Resting SOB [n  ] Exertional SOB  n  ]  Orthopnea [n  ]   Pedal Edema Milo.Brash   ]    Palpitations [ n ] Syncope  [ n ]   Presyncope [ n  ]  General Review of Systems: [Y] = yes [  ]=no Constitional: recent weight change [ n ]; anorexia [  ]; fatigue [  ]; nausea [  ]; night sweats [  ]; fever [  ]; or chills [  ];                                                                                                                                          Dental: poor dentition[  ]  Eye :  blurred vision [  ]; diplopia [   ]; vision changes [  ];  Amaurosis fugax[  ]; Resp: cough [  ];  wheezing[  ];  hemoptysis[  ]; shortness of breath[  ]; paroxysmal nocturnal dyspnea[  ]; dyspnea on exertion[  ]; or orthopnea[  ];  GI:  gallstones[  ], vomiting[  ];  dysphagia[  ]; melena[  ];  hematochezia [  ]; heartburn[  ];   Hx of  Colonoscopy[  ]; GU: kidney stones [ y ]; hematuria[ n ];   dysuria [ n ];  nocturia[ n ];  history of     obstruction [ n ];             Skin: rash, swelling[  ];, hair loss[  ];  peripheral edema[  ];  or itching[  ]; Musculosketetal: myalgias[  ];  joint swelling[  ];  joint erythema[  ];  joint pain[  ];  back pain[  ];  Heme/Lymph: bruising[  ];  bleeding[  ];  anemia[  ];  Neuro: TIA[  ];  headaches[  ];  stroke[  ];  vertigo[  ];  seizures[  ];   paresthesias[  ];  difficulty walking[  ];  Psych:depression[  ]; anxiety[  ];  Endocrine: diabetes[  ];  thyroid dysfunction[  ];  Immunizations: Flu [?  ];  Pneumococcal[ ? ];  Other:negative  Physical Exam: BP 125/76  Pulse 65  Temp(Src) 98.6 F (37 C) (Oral)  Resp 20  Ht 5\' 5"  (1.651 m)  Wt 228 lb 2.8 oz (103.5 kg)  BMI 37.97 kg/m2  SpO2 97%  General appearance: alert, cooperative, appears stated age and no distress Neurologic: intact, no carotid bruits Heart: regular rate and rhythm, S1, S2 normal, no murmur, click, rub or gallop, normal apical impulse and no rub Lungs: clear to auscultation bilaterally Abdomen: soft, non-tender; bowel sounds normal; no masses,  no organomegaly Extremities: extremities normal, atraumatic, no cyanosis or edema, Homans sign is negative, no sign of DVT, no edema, redness or tenderness in the calves or thighs and no ulcers, gangrene or trophic changes. Vein ok for bypass Wound: cath site rt without hematoma   Diagnostic Studies & Laboratory data:     Recent Radiology Findings:   No results found.    Recent Lab Findings: Lab Results  Component Value Date    WBC 4.6 11/05/2011   HGB 15.4 11/05/2011   HCT 45.0 11/05/2011   PLT 163 11/05/2011   GLUCOSE 220* 11/10/2011   CHOL 228* 04/28/2010   TRIG 365.0* 04/28/2010   HDL 39.70 04/28/2010   LDLDIRECT 132.8 04/28/2010   LDLCALC 82 12/22/2006   ALT 26 05/05/2011   AST 21 05/05/2011   NA 136 11/04/2011   K 3.7 11/04/2011   CL 100 11/04/2011   CREATININE 0.89 11/04/2011   BUN 16 11/04/2011   CO2 21 11/04/2011   TSH 1.65 05/05/2011   INR 1.0 11/04/2011   HGBA1C 8.7* 05/05/2011   Procedure: Left Heart Cath, Selective Coronary Angiography, LV angiography  Indication: Chest pain, SOB  Procedural details: The right groin was prepped, draped, and anesthetized with 1% lidocaine. Using modified Seldinger technique, a 5 French sheath was introduced into the right femoral artery. Standard Judkins catheters were used for coronary angiography and left ventriculography. Catheter exchanges were performed over a guidewire. There were no immediate procedural complications. The patient was transferred to the post catheterization recovery area for further monitoring.  Procedural Findings:  Hemodynamics:  AO 106/66/84  LV 114/8/16  Coronary angiography:  Coronary dominance: Right  Left mainstem: Normal with no significant disease. Bifurcates into the LAD and LCX  Left anterior descending (LAD): Moderate sized vessel with proximal diagonal branch and mid diagonal that is branching. There is proximal to mid diffuse calcification and several regions of tight stenosis estimated at 80%. Also with moderate to severe proximal D1 disease. Disease extends after the takeoff of the D3 (branching vessel). D2 is diffusely diseased and small. There is collaterals from right to left.  Left circumflex (LCx): Moderate to large sized vessel with mild lumenal irregularities.  Right coronary artery (RCA): Moderate to large sized vessel with mild lumenal irregularities.  Left ventriculography: Left ventricular systolic function is normal,  LVEF is estimated at 55-65%, there is no significant mitral regurgitation or AS.  Final Conclusions: / Recommendations:  Severe stenosis and calcification of the proximal to mid LAD extending before D1 to beyond D3. Stenosis estimated at 80%. Also with proximal D1 disease. Normal EF 55% with no wall motion ABN. No significant AS/MR.  The case was discussed with Dr. Kirke Corin and the patient. Given the length of the LAD lesions and need for such an extense stretch of stenting, Diagonal disease, the fact that he is diabetic and at high risk for stent complications in the future, we will consult CT surgery for possible LIMA  to the LAD with SVG to the D1 and D3 (branching vessel).  Patient is in agreement. He is currently not on plavix.  [name]  11/10/2011, 12:05 PM    Assessment / Plan:    #1 significant proximal complex LAD disease, poor anatomic situation for stenting #2 diabetes/ need improved control , HgBA1C 8.7 #3 hyperlipidemia   Plan with the patient's diabetes and complex LAD disease coronary artery bypass grafting ideally with the left internal mammary to left and 2 descending coronary artery will offer the best long-term solution to the patient's coronary disease and increasing anginal symptoms.  I have discussed with he and his wife the risks and options of coronary artery bypass grafting. The goals risks and alternatives of the planned surgical procedure CABG have been discussed with the patient in detail. The risks of the procedure including death, infection, stroke, myocardial infarction, bleeding, blood transfusion have all been discussed specifically.  I have quoted Letitia Neri a 2 % of perioperative mortality and a complication rate as high as 20%. The patient's questions have been answered.Letitia Neri is willing  to proceed with the planned procedure.  Tentative CABG on Friday Dec7  Delight Ovens MD 11/10/2011 5:38 PM

## 2011-11-10 NOTE — Progress Notes (Signed)
Bedrest begins @ 1300.  Tegaderm dressing applied by Venda Rodes.  Pressure held by Venda Rodes for 15 mins.

## 2011-11-10 NOTE — Op Note (Signed)
Cardiac Catheterization Procedure Note  Name: [name] [name] MRN: [id number] DOB: [birth year]  Procedure: Left Heart Cath, Selective Coronary Angiography, LV angiography  Indication:  Chest pain, SOB  Procedural details: The right groin was prepped, draped, and anesthetized with 1% lidocaine. Using modified Seldinger technique, a 5 French sheath was introduced into the right femoral artery. Standard Judkins catheters were used for coronary angiography and left ventriculography. Catheter exchanges were performed over a guidewire. There were no immediate procedural complications. The patient was transferred to the post catheterization recovery area for further monitoring.  Procedural Findings:  Hemodynamics:     AO 106/66/84    LV 114/8/16   Coronary angiography:  Coronary dominance: Right  Left mainstem:   Normal with no significant disease. Bifurcates into the LAD and LCX  Left anterior descending (LAD):   Moderate sized vessel with proximal diagonal branch and mid diagonal that is branching. There is proximal to mid diffuse calcification and several regions of tight stenosis estimated at 80%. Also with moderate to severe proximal D1 disease. Disease extends after the takeoff of the D3 (branching vessel). D2 is diffusely diseased and small. There is collaterals from right to left.  Left circumflex (LCx):  Moderate to large sized vessel with mild lumenal irregularities.   Right coronary artery (RCA):  Moderate to large sized vessel with mild lumenal irregularities.   Left ventriculography: Left ventricular systolic function is normal, LVEF is estimated at 55-65%, there is no significant mitral regurgitation or AS.  Final Conclusions: /  Recommendations:  Severe stenosis and calcification of the proximal to mid LAD extending before D1 to beyond D3. Stenosis estimated at 80%. Also with proximal D1 disease. Normal EF 55% with no wall motion ABN. No significant AS/MR. The case was  discussed with Dr. Kirke Corin and the patient. Given the length of the LAD lesions and need for such an extense stretch of stenting, Diagonal disease, the fact that he is diabetic and at high risk for stent complications in the future, we will consult CT surgery for possible LIMA to the LAD with SVG to the D1 and D3 (branching vessel). Patient is in agreement. He is currently not on plavix.     [name] 11/10/2011, 12:05 PM

## 2011-11-10 NOTE — H&P (Addendum)
60 year old gentleman with obesity, hyperlipidemia, diabetes, hypertension, presenting to our referral from Dr. Alphonsus Sias for chest pain.   He reports that approximately 6 weeks ago he was digging under a trailer For new posts for a deck. He was digging through heavy roots in the ground. He started developing chest and shoulder pain with exertion. Since then, his symptoms have recurred with exertion and are getting worse. He feels it is like a "bear hug" when he gets the chest pain. Symptoms are more intense, lasting longer. 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  He does report that his father had coronary artery disease.   He tried simvastatin previously though had muscle ache.  He is unable to treadmill secondary to severe knee pain.  He reports having an adenosine Myoview in 2009 that showed no ischemia.   EKG shows normal sinus rhythm with rate 78 beats per minute with T wave abnormality in V2 through V5, one and aVL   Outpatient Encounter Prescriptions as of 11/04/2011   Medication  Sig  Dispense  Refill   .  DIOVAN HCT 160-25 MG per tablet  TAKE 1 TABLET DAILY FOR HIGH BLOOD PRESSURE  90 tablet  2   .  HYDROcodone-acetaminophen (VICODIN) 5-500 MG per tablet  Take 1 tablet by mouth 2 (two) times daily as needed.  60 tablet  0   .  metFORMIN (GLUCOPHAGE) 1000 MG tablet  Take 500 mg by mouth 2 (two) times daily with a meal.     .  pioglitazone (ACTOS) 30 MG tablet  TAKE 1 TABLET DAILY  90 tablet  1   .  aspirin 81 MG tablet  Take 81 mg by mouth daily.     Marland Kitchen  DISCONTD: metFORMIN (GLUCOPHAGE) 1000 MG tablet  TAKE ONE TABLET BY MOUTH TWICE DAILY BEFORE MEALS FOR DIABETES  180 tablet  3   Review of Systems  Constitutional: Negative.  HENT: Negative.  Eyes: Negative.  Respiratory: Negative.  Cardiovascular: Positive for chest pain.  Gastrointestinal: Negative.  Musculoskeletal: Negative.  Skin:  Negative.  Neurological: Negative.  Hematological: Negative.  Psychiatric/Behavioral: Negative.  All other systems reviewed and are negative.   BP 130/70  Pulse 78  Ht 5\' 5"  (1.651 m)  Wt 233 lb 8 oz (105.915 kg)  BMI 38.86 kg/m2  Physical Exam  Nursing note and vitals reviewed.  Constitutional: He is oriented to person, place, and time. He appears well-developed and well-nourished.  HENT:  Head: Normocephalic.  Nose: Nose normal.  Mouth/Throat: Oropharynx is clear and moist.  Eyes: Conjunctivae are normal. Pupils are equal, round, and reactive to light.  Neck: Normal range of motion. Neck supple. No JVD present.  Cardiovascular: Normal rate, regular rhythm, S1 normal, S2 normal, normal heart sounds and intact distal pulses. Exam reveals no gallop and no friction rub.  No murmur heard.  Pulmonary/Chest: Effort normal and breath sounds normal. No respiratory distress. He has no wheezes. He has no rales. He exhibits no tenderness.  Abdominal: Soft. Bowel sounds are normal. He exhibits no distension. There is no tenderness.  Musculoskeletal: Normal range of motion. He exhibits no edema and no tenderness.  Lymphadenopathy:  He has no cervical adenopathy.  Neurological: He is alert and oriented to person, place, and time. Coordination normal.  Skin: Skin is warm and dry. No rash noted. No erythema.  Psychiatric: He has a normal mood and  affect. His behavior is normal. Judgment and thought content normal.     Assessment and Plan   Chest pain - [name], MD 11/04/2011 4:40 PM Signed  Symptoms are very concerning for unstable angina. He is starting to have symptoms at rest, worsening symptoms with exertion.  He has several risk factors including poorly controlled diabetes and cholesterol, he is male above age 82.  We discussed the various ways to evaluate his chest pain and he has elected to proceed with a cardiac catheterization. This has tentatively been scheduled for next  week.  We'll increase his aspirin to 162 mg daily, we will start him on low-dose Lipitor 10 mg daily, and have given him nitroglycerin sublingual to take p.r.n. For worsening chest pain. DIABETES MELLITUS, TYPE II - [name], MD 11/04/2011 4:40 PM Signed  We have encouraged continued exercise, careful diet management in an effort to lose weight.  Hemoglobin A1c greater than 8. HYPERTENSION - [name], MD 11/04/2011 4:40 PM Signed  Blood pressure is well controlled on today's visit. No changes made to the medications.   HYPERLIPIDEMIA - [name], MD 11/04/2011 4:41 PM Signed  He did have myalgias on simvastatin. We have suggested he try low-dose Lipitor. If he has recurrent myalgias, we would try Crestor 5 mg or low-dose pravastatin.    No change since prior visit to the office.

## 2011-11-10 NOTE — Progress Notes (Signed)
Called to write admission orders on Mr. Nicolls. Discussed the patient with Dr. Mariah Milling.   - he will be placed on heparin per pharmacy no bolus at 1900 - ASA will be continued at 81mg  - Lopressor 25mg  bid will be added - Continue low dose statin and add Zetia - NPO after midnight pending TCTS/interventionalist decision - Continue Actos/Diovan tomorrow, initiate SSI  Dayna Dunn PA-C 11/10/2011 6:14 PM

## 2011-11-10 NOTE — Progress Notes (Signed)
Report called to [id number], [address].  Transported to [id number] via stretcher and monitor.

## 2011-11-11 ENCOUNTER — Encounter: Payer: Self-pay | Admitting: Cardiovascular Disease

## 2011-11-11 ENCOUNTER — Inpatient Hospital Stay (HOSPITAL_COMMUNITY): Payer: BC Managed Care – PPO

## 2011-11-11 ENCOUNTER — Encounter (HOSPITAL_COMMUNITY): Payer: Self-pay | Admitting: General Practice

## 2011-11-11 DIAGNOSIS — I2510 Atherosclerotic heart disease of native coronary artery without angina pectoris: Principal | ICD-10-CM

## 2011-11-11 DIAGNOSIS — Z01810 Encounter for preprocedural cardiovascular examination: Secondary | ICD-10-CM

## 2011-11-11 DIAGNOSIS — I25118 Atherosclerotic heart disease of native coronary artery with other forms of angina pectoris: Secondary | ICD-10-CM | POA: Clinically undetermined

## 2011-11-11 DIAGNOSIS — R079 Chest pain, unspecified: Secondary | ICD-10-CM

## 2011-11-11 LAB — URINALYSIS, ROUTINE W REFLEX MICROSCOPIC
Bilirubin Urine: NEGATIVE
Glucose, UA: >1000 mg/dL — AB
Hgb urine dipstick: NEGATIVE
Ketones, ur: NEGATIVE mg/dL
Leukocytes, UA: NEGATIVE
Nitrite: NEGATIVE
Protein, ur: NEGATIVE mg/dL
Specific Gravity, Urine: 1.015 (ref 1.005–1.030)
Urobilinogen, UA: 0.2 mg/dL (ref 0.0–1.0)
pH: 7 (ref 5.0–8.0)

## 2011-11-11 LAB — BLOOD GAS, ARTERIAL
Acid-Base Excess: 3.7 mmol/L — ABNORMAL HIGH (ref 0.0–2.0)
Bicarbonate: 27.7 mEq/L — ABNORMAL HIGH (ref 20.0–24.0)
Drawn by: 281201
FIO2: 0.21 %
O2 Saturation: 94.6 %
Patient temperature: 98.6
TCO2: 29 mmol/L (ref 0–100)
pCO2 arterial: 42 mmHg (ref 35.0–45.0)
pH, Arterial: 7.435 (ref 7.350–7.450)
pO2, Arterial: 69.6 mmHg — ABNORMAL LOW (ref 80.0–100.0)

## 2011-11-11 LAB — BASIC METABOLIC PANEL
BUN: 18 mg/dL (ref 6–23)
CO2: 26 mEq/L (ref 19–32)
Chloride: 98 mEq/L (ref 96–112)
Glucose, Bld: 241 mg/dL — ABNORMAL HIGH (ref 70–99)
Potassium: 3.6 mEq/L (ref 3.5–5.1)
Sodium: 133 mEq/L — ABNORMAL LOW (ref 135–145)

## 2011-11-11 LAB — COMPREHENSIVE METABOLIC PANEL
ALT: 25 U/L (ref 0–53)
AST: 30 U/L (ref 0–37)
Albumin: 3.9 g/dL (ref 3.5–5.2)
Alkaline Phosphatase: 103 U/L (ref 39–117)
BUN: 16 mg/dL (ref 6–23)
CO2: 28 mEq/L (ref 19–32)
Calcium: 9.5 mg/dL (ref 8.4–10.5)
Chloride: 97 mEq/L (ref 96–112)
Creatinine, Ser: 0.95 mg/dL (ref 0.50–1.35)
GFR calc Af Amer: >90 mL/min (ref >90)
GFR calc non Af Amer: 88 mL/min — ABNORMAL LOW (ref >90)
Glucose, Bld: 266 mg/dL — ABNORMAL HIGH (ref 70–99)
Potassium: 4 mEq/L (ref 3.5–5.1)
Sodium: 134 mEq/L — ABNORMAL LOW (ref 135–145)
Total Bilirubin: 0.3 mg/dL (ref 0.3–1.2)
Total Protein: 7.1 g/dL (ref 6.0–8.3)

## 2011-11-11 LAB — CBC
Hemoglobin: 15.4 g/dL (ref 13.0–17.0)
MCH: 30.9 pg (ref 26.0–34.0)
MCHC: 35.2 g/dL (ref 30.0–36.0)
RDW: 13.1 % (ref 11.5–15.5)

## 2011-11-11 LAB — GLUCOSE, CAPILLARY
Glucose-Capillary: 252 mg/dL — ABNORMAL HIGH (ref 70–99)
Glucose-Capillary: 257 mg/dL — ABNORMAL HIGH (ref 70–99)
Glucose-Capillary: 279 mg/dL — ABNORMAL HIGH (ref 70–99)

## 2011-11-11 LAB — APTT: aPTT: 54 seconds — ABNORMAL HIGH (ref 24–37)

## 2011-11-11 LAB — TYPE AND SCREEN
ABO/RH(D): O POS
Antibody Screen: NEGATIVE

## 2011-11-11 LAB — URINE MICROSCOPIC-ADD ON

## 2011-11-11 LAB — HEPARIN LEVEL (UNFRACTIONATED): Heparin Unfractionated: <0.1 IU/mL — ABNORMAL LOW (ref 0.30–0.70)

## 2011-11-11 MED ORDER — VANCOMYCIN HCL 1000 MG IV SOLR
1500.0000 mg | INTRAVENOUS | Status: DC
  Filled 2011-11-11: qty 1500

## 2011-11-11 MED ORDER — SODIUM CHLORIDE 0.9 % IV SOLN
0.1000 ug/kg/h | INTRAVENOUS | Status: DC
  Filled 2011-11-11: qty 4

## 2011-11-11 MED ORDER — SODIUM CHLORIDE 0.9 % IV SOLN
INTRAVENOUS | Status: DC
  Filled 2011-11-11: qty 40

## 2011-11-11 MED ORDER — EPINEPHRINE HCL 1 MG/ML IJ SOLN
0.5000 ug/min | INTRAVENOUS | Status: DC
  Filled 2011-11-11: qty 4

## 2011-11-11 MED ORDER — METOPROLOL TARTRATE 12.5 MG HALF TABLET
12.5000 mg | ORAL_TABLET | Freq: Once | ORAL | Status: AC
  Administered 2011-11-12: 12.5 mg via ORAL
  Filled 2011-11-11: qty 1

## 2011-11-11 MED ORDER — BISACODYL 5 MG PO TBEC
5.0000 mg | DELAYED_RELEASE_TABLET | Freq: Once | ORAL | Status: AC
  Administered 2011-11-11: 5 mg via ORAL
  Filled 2011-11-11: qty 1

## 2011-11-11 MED ORDER — CHLORHEXIDINE GLUCONATE 4 % EX LIQD
60.0000 mL | Freq: Once | CUTANEOUS | Status: AC
  Administered 2011-11-11: 4 via TOPICAL
  Filled 2011-11-11: qty 30

## 2011-11-11 MED ORDER — PHENYLEPHRINE HCL 10 MG/ML IJ SOLN
30.0000 ug/min | INTRAMUSCULAR | Status: DC
  Filled 2011-11-11: qty 2

## 2011-11-11 MED ORDER — DOPAMINE-DEXTROSE 3.2-5 MG/ML-% IV SOLN
2.0000 ug/kg/min | INTRAVENOUS | Status: DC
  Filled 2011-11-11: qty 250

## 2011-11-11 MED ORDER — ALPRAZOLAM 0.25 MG PO TABS
0.2500 mg | ORAL_TABLET | ORAL | Status: DC | PRN
  Administered 2011-11-12: 0.5 mg via ORAL
  Filled 2011-11-11: qty 2

## 2011-11-11 MED ORDER — SODIUM CHLORIDE 0.9 % IV SOLN
INTRAVENOUS | Status: DC
  Filled 2011-11-11: qty 1

## 2011-11-11 MED ORDER — DEXTROSE 5 % IV SOLN
1.5000 g | INTRAVENOUS | Status: DC
  Filled 2011-11-11: qty 1.5

## 2011-11-11 MED ORDER — NITROGLYCERIN IN D5W 200-5 MCG/ML-% IV SOLN
2.0000 ug/min | INTRAVENOUS | Status: DC
  Filled 2011-11-11: qty 250

## 2011-11-11 MED ORDER — CHLORHEXIDINE GLUCONATE 4 % EX LIQD
60.0000 mL | Freq: Once | CUTANEOUS | Status: AC
  Administered 2011-11-12: 4 via TOPICAL
  Filled 2011-11-11: qty 75

## 2011-11-11 MED ORDER — FENTANYL CITRATE 0.05 MG/ML IJ SOLN: 50.0000 ug | INTRAMUSCULAR | Status: DC | PRN

## 2011-11-11 MED ORDER — MAGNESIUM SULFATE 50 % IJ SOLN
40.0000 meq | INTRAMUSCULAR | Status: DC
  Filled 2011-11-11: qty 10

## 2011-11-11 MED ORDER — DEXTROSE 5 % IV SOLN
750.0000 mg | INTRAVENOUS | Status: DC
  Filled 2011-11-11 (×2): qty 750

## 2011-11-11 MED ORDER — PLASMA-LYTE 148 IV SOLN
INTRAVENOUS | Status: AC
  Administered 2011-11-12: 09:00:00
  Filled 2011-11-11: qty 0.5

## 2011-11-11 MED ORDER — CHLORHEXIDINE GLUCONATE 4 % EX LIQD: 60.0000 mL | Freq: Once | CUTANEOUS | Status: DC

## 2011-11-11 MED ORDER — MIDAZOLAM HCL 2 MG/2ML IJ SOLN: 1.0000 mg | INTRAMUSCULAR | Status: DC | PRN

## 2011-11-11 MED ORDER — POTASSIUM CHLORIDE 2 MEQ/ML IV SOLN
80.0000 meq | INTRAVENOUS | Status: DC
  Filled 2011-11-11: qty 40

## 2011-11-11 NOTE — Progress Notes (Signed)
*  PRELIMINARY RESULTS*  Pre CABG Extremity Dopplers have been performed. Palmar Arch Evaluation- Bilateral waveforms remain normal with radial compression and obliterate with ulnar compression. Right ABI = 1.26 and Left ABI = 1.11.  Farrel Demark 11/11/2011, 10:22 AM

## 2011-11-11 NOTE — Progress Notes (Signed)
Patient Name: [name] [name] Date of Encounter: 11/11/2011     Active Problems: Patient Active Problem List  Diagnoses  . DIABETES MELLITUS, TYPE II  . HYPERLIPIDEMIA  . HYPERTENSION  . Chest pain  . CAD (coronary artery disease), native coronary artery - primary problem       SUBJECTIVE:  Pt had no CP/SOB overnight. 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 He is also concerned about the risk of doing this. He does not want a diabetes nutrition consult.   He requests Dr Kirke Corin and Dr Mariah Milling discuss the issues and then talk to Dr Tyrone Sage.    OBJECTIVE  Filed Vitals:   11/10/11 1530 11/10/11 2035 11/10/11 2043 11/11/11 0630  BP: 125/76  120/72 128/71  Pulse: 65  75 64  Temp: 98.6 F (37 C)  98.1 F (36.7 C) 97.5 F (36.4 C)  TempSrc: Oral Oral Oral Oral  Resp: 20  20 20   Height: 5\' 5"  (1.651 m)     Weight: 228 lb 2.8 oz (103.5 kg)     SpO2: 97%  95% 98%    Intake/Output Summary (Last 24 hours) at 11/11/11 4098 Last data filed at 11/10/11 2200  Gross per 24 hour  Intake  25.73 ml  Output      0 ml  Net  25.73 ml    PHYSICAL EXAM  General: Well developed, well nourished, in no acute distress. Head: Normocephalic, atraumatic, sclera non-icteric, no xanthomas, nares are without discharge.  Neck: Supple without bruits or JVD but difficult to asses 2nd body habitus Lungs:  Resp regular and unlabored, CTA except for a few rales. Heart: RRR, ? S4, no murmurs. Abdomen: Soft, non-tender, non-distended, BS + x 4.  Msk:  Strength and tone appears normal for age. Extremities: No clubbing, cyanosis or edema. DP/PT/Radials 2+ and equal bilaterally. Cath site without hematoma Neuro: Alert and oriented X 3. Moves all extremities spontaneously. Psych: Normal affect.  LABS:  CBC:  Basename 11/11/11 0044  WBC 9.4  NEUTROABS --  HGB 15.4  HCT 43.7  MCV 87.6  PLT 216    Basic Metabolic Panel:  Basename 11/11/11 0044 11/10/11 1159  NA 133* --  K 3.6 --  CL 98 --  CO2 26 --  GLUCOSE 241* 220*  BUN 18 --  CREATININE 0.88 --  CALCIUM 9.4 --  MG -- --  PHOS -- --   Hemoglobin A1C: No results found for this basename: HGBA1C in the last 72 hours Fasting Lipid Panel: No results found for this basename: CHOL,HDL,LDLCALC,TRIG,CHOLHDL,LDLDIRECT in the last 72 hours  TELE: SR  Current Medications:   . aspirin  81 mg Oral Daily  . atorvastatin  20 mg Oral QHS  . ezetimibe  10 mg Oral Daily  . hydrochlorothiazide  25 mg Oral Daily  . insulin aspart  0-9 Units Subcutaneous TID WC  . metoprolol tartrate  25 mg Oral BID  . olmesartan  20 mg Oral Daily  . pioglitazone  30 mg Oral QAC breakfast  . sodium chloride  3 mL Intravenous Q12H    ASSESSMENT AND PLAN: Chest pain/CAD: CABG tent sch 12-7, per TCTS, cont current Rx. MDs discuss plausibility of going home pre-op and advise pt/wife of risks. On ASA, BB, statin.  DM: continue Rx, add SSI  Otherwise, see H&P, continue current Rx. ?Cardiac rehab to see?  Signed, Theodore Demark ,  PA-C

## 2011-11-11 NOTE — Progress Notes (Signed)
[address]            [name] [address]          [phone]       HERMAN FIERO [facility] Medical Record #[id number] Date of Birth: 08/17/1951  Referring: Dr Lewie Loron Primary Care: Tillman Abide, MD, MD  Chief Complaint:  Chest Pain  History of Present Illness:     He reports that approximately 6 weeks ago he was digging under a trailer For new posts for a deck. He was digging through heavy roots in the ground. He started developing chest and shoulder pain with exertion. Since then, his symptoms have recurred with exertion and are getting worse. He feels it is like a "bear hug" when he gets the chest pain. Symptoms are more intense, lasting longer. 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     Current Activity/ Functional Status: No limitations   Past Medical History  Diagnosis Date  . BPH (benign prostatic hypertrophy)   . Hx of colonic polyps   . Diabetes mellitus   . Hyperlipidemia   . Hypertension   . Cerebral aneurysm Negative MRI of Head 2008  . Carpal tunnel syndrome   . OSA (obstructive sleep apnea)   . Arthritis   . Kidney stones     Past Surgical History  Procedure Date  . Knee surgery     left  . Meniscus repair 03/08    right knee  . Hernia repair     History  Smoking status  . Never Smoker   Smokeless tobacco  . Never Used    History  Alcohol Use No    History   Social History  . Marital Status: Married    Spouse Name: N/A    Number of Children: 3   Occupational History  . tool maker        Allergies  Allergen Reactions  . Doxazosin Mesylate     REACTION: didn't work and may have caused SOB  . Doxycycline     REACTION: unspecified  . Glimepiride     REACTION: red eyes  . Glipizide     REACTION: myalgia??  . Losartan Potassium-Hctz     REACTION: chest \\T \ leg pain  . Rosiglitazone  Maleate     REACTION: myalgia  . Simvastatin     REACTION: myalgias   Prescriptions prior to admission  Medication Sig Dispense Refill  . aspirin 81 MG tablet Take 2 tablets (162 mg total) by mouth daily.      Marland Kitchen atorvastatin (LIPITOR) 20 MG tablet Take 20 mg by mouth daily.        Marland Kitchen HYDROcodone-acetaminophen (VICODIN) 5-500 MG per tablet Take 1 tablet by mouth 2 (two) times daily as needed. For pain.       . metFORMIN (GLUCOPHAGE) 1000 MG tablet Take 500 mg by mouth 2 (two) times daily with a meal.        . nitroGLYCERIN (NITROSTAT) 0.4 MG SL tablet Place 0.4 mg under the tongue every 5 (five) minutes as needed. For chest pain.       . pioglitazone (ACTOS) 30 MG tablet Take 30 mg by mouth daily.        . valsartan-hydrochlorothiazide (DIOVAN-HCT) 160-25 MG per tablet Take  1 tablet by mouth daily.        Marland Kitchen DISCONTD: atorvastatin (LIPITOR) 20 MG tablet Take 1 tablet (20 mg total) by mouth daily.  30 tablet  11  . DISCONTD: HYDROcodone-acetaminophen (VICODIN) 5-500 MG per tablet Take 1 tablet by mouth 2 (two) times daily as needed.  60 tablet  0  . DISCONTD: nitroGLYCERIN (NITROSTAT) 0.4 MG SL tablet Place 1 tablet (0.4 mg total) under the tongue every 5 (five) minutes as needed for chest pain.  25 tablet  3   Current Facility-Administered Medications   Facility-Administered Medications Ordered in Other Encounters  Medication Dose Route Frequency Provider Last Rate Last Dose  . aspirin chewable tablet 324 mg  324 mg Oral Once Antonieta Iba, MD   324 mg at 11/10/11 1039      Family History  Problem Relation Age of Onset  . Coronary artery disease Father   . Diabetes Neg Hx   . Hypertension Neg Hx   . Cancer Neg Hx      Review of Systems:     Cardiac Review of Systems: Y or N  Chest Pain [  y  ]  Resting SOB [n  ] Exertional SOB  n  ]  Orthopnea [n  ]   Pedal Edema Milo.Brash   ]    Palpitations [ n ] Syncope  [ n ]   Presyncope [ n  ]  General Review of Systems: [Y] = yes [   ]=no Constitional: recent weight change [ n ]; anorexia [  ]; fatigue [  ]; nausea [  ]; night sweats [  ]; fever [  ]; or chills [  ];                                                                                                                                          Dental: poor dentition[  ]  Eye : blurred vision [  ]; diplopia [   ]; vision changes [  ];  Amaurosis fugax[  ]; Resp: cough [  ];  wheezing[  ];  hemoptysis[  ]; shortness of breath[  ]; paroxysmal nocturnal dyspnea[  ]; dyspnea on exertion[  ]; or orthopnea[  ];  GI:  gallstones[  ], vomiting[  ];  dysphagia[  ]; melena[  ];  hematochezia [  ]; heartburn[  ];   Hx of  Colonoscopy[  ]; GU: kidney stones [ y ]; hematuria[ n ];   dysuria [ n ];  nocturia[ n ];  history of     obstruction [ n ];             Skin: rash, swelling[  ];, hair loss[  ];  peripheral edema[  ];  or itching[  ]; Musculosketetal: myalgias[  ];  joint swelling[  ];  joint erythema[  ];  joint pain[  ];  back  pain[  ];  Heme/Lymph: bruising[  ];  bleeding[  ];  anemia[  ];  Neuro: TIA[  ];  headaches[  ];  stroke[  ];  vertigo[  ];  seizures[  ];   paresthesias[  ];  difficulty walking[  ];  Psych:depression[  ]; anxiety[  ];  Endocrine: diabetes[  ];  thyroid dysfunction[  ];  Immunizations: Flu [?  ]; Pneumococcal[ ? ];  Other:negative  Physical Exam: BP 122/79  Pulse 74  Temp(Src) 98 F (36.7 C) (Oral)  Resp 20  Ht 5\' 5"  (1.651 m)  Wt 230 lb 6.1 oz (104.5 kg)  BMI 38.34 kg/m2  SpO2 95%  General appearance: alert, cooperative, appears stated age and no distress Neurologic: intact, no carotid bruits Heart: regular rate and rhythm, S1, S2 normal, no murmur, click, rub or gallop, normal apical impulse and no rub Lungs: clear to auscultation bilaterally Abdomen: soft, non-tender; bowel sounds normal; no masses,  no organomegaly Extremities: extremities normal, atraumatic, no cyanosis or edema, Homans sign is negative, no sign of DVT, no edema,  redness or tenderness in the calves or thighs and no ulcers, gangrene or trophic changes. Vein ok for bypass Wound: cath site rt without hematoma   Diagnostic Studies & Laboratory data:     Recent Radiology Findings:   No results found.    Recent Lab Findings: Lab Results  Component Value Date   WBC 9.4 11/11/2011   HGB 15.4 11/11/2011   HCT 43.7 11/11/2011   PLT 216 11/11/2011   GLUCOSE 241* 11/11/2011   CHOL 228* 04/28/2010   TRIG 365.0* 04/28/2010   HDL 39.70 04/28/2010   LDLDIRECT 132.8 04/28/2010   LDLCALC 82 12/22/2006   ALT 26 05/05/2011   AST 21 05/05/2011   NA 133* 11/11/2011   K 3.6 11/11/2011   CL 98 11/11/2011   CREATININE 0.88 11/11/2011   BUN 18 11/11/2011   CO2 26 11/11/2011   TSH 1.65 05/05/2011   INR 1.0 11/04/2011   HGBA1C 8.7* 05/05/2011   Procedure: Left Heart Cath, Selective Coronary Angiography, LV angiography  Indication: Chest pain, SOB  Procedural details: The right groin was prepped, draped, and anesthetized with 1% lidocaine. Using modified Seldinger technique, a 5 French sheath was introduced into the right femoral artery. Standard Judkins catheters were used for coronary angiography and left ventriculography. Catheter exchanges were performed over a guidewire. There were no immediate procedural complications. The patient was transferred to the post catheterization recovery area for further monitoring.  Procedural Findings:  Hemodynamics:  AO 106/66/84  LV 114/8/16  Coronary angiography:  Coronary dominance: Right  Left mainstem: Normal with no significant disease. Bifurcates into the LAD and LCX  Left anterior descending (LAD): Moderate sized vessel with proximal diagonal branch and mid diagonal that is branching. There is proximal to mid diffuse calcification and several regions of tight stenosis estimated at 80%. Also with moderate to severe proximal D1 disease. Disease extends after the takeoff of the D3 (branching vessel). D2 is diffusely diseased and small.  There is collaterals from right to left.  Left circumflex (LCx): Moderate to large sized vessel with mild lumenal irregularities.  Right coronary artery (RCA): Moderate to large sized vessel with mild lumenal irregularities.  Left ventriculography: Left ventricular systolic function is normal, LVEF is estimated at 55-65%, there is no significant mitral regurgitation or AS.  Final Conclusions: / Recommendations:  Severe stenosis and calcification of the proximal to mid LAD extending before D1 to beyond D3. Stenosis estimated at  80%. Also with proximal D1 disease. Normal EF 55% with no wall motion ABN. No significant AS/MR.  The case was discussed with Dr. Kirke Corin and the patient. Given the length of the LAD lesions and need for such an extense stretch of stenting, Diagonal disease, the fact that he is diabetic and at high risk for stent complications in the future, we will consult CT surgery for possible LIMA to the LAD with SVG to the D1 and D3 (branching vessel).  Patient is in agreement. He is currently not on plavix.  [name]  11/10/2011, 12:05 PM    Assessment / Plan:    #1 significant proximal complex LAD disease, poor anatomic situation for stenting #2 diabetes/ need improved control , HgBA1C 8.7 #3 hyperlipidemia   Plan with the patient's diabetes and complex LAD disease coronary artery bypass grafting ideally with the left internal mammary to left and 2 descending coronary artery will offer the best long-term solution to the patient's coronary disease and increasing anginal symptoms.  I have discussed with he and his wife the risks and options of coronary artery bypass grafting. The goals risks and alternatives of the planned surgical procedure CABG have been discussed with the patient in detail. The risks of the procedure including death, infection, stroke, myocardial infarction, bleeding, blood transfusion have all been discussed specifically.  I have quoted Letitia Neri a 2  % of perioperative mortality and a complication rate as high as 20%. The patient's questions have been answered.Letitia Neri is willing  to proceed with the planned procedure.  Tentative CABG on Friday Dec7   Today I have further discussed  Planned CABG with patient and cardiology. Patient is agreeable with proceeding tomorrow  Delight Ovens MD 11/11/2011 5:53 PM

## 2011-11-11 NOTE — Plan of Care (Signed)
Problem: Consults Goal: Cardiac Surgery Patient Education ( See Patient Education module for education specifics.) Outcome: Progressing Awaiting wife to return to watch videos

## 2011-11-11 NOTE — Progress Notes (Signed)
Heparin resumed post-cath at 1050/hr. Level results as <0.1 no bleeding or other complications noted. Increasing to 16units/kg/hr 1350 units/hr will f/u with 6 hour Heparin level.

## 2011-11-11 NOTE — Progress Notes (Signed)
ANTICOAGULATION CONSULT NOTE - Initial Consult  Pharmacy Consult for Heparin Indication: CAD: Anticoagulation s/p cath while awaiting CABG  Allergies  Allergen Reactions  . Doxazosin Mesylate     REACTION: didn't work and may have caused SOB  . Doxycycline     REACTION: unspecified  . Glimepiride     REACTION: red eyes  . Glipizide     REACTION: myalgia??  . Losartan Potassium-Hctz     REACTION: chest \\T \ leg pain  . Rosiglitazone Maleate     REACTION: myalgia  . Simvastatin     REACTION: myalgias    Patient Measurements: Height: 5\' 5"  (165.1 cm) Weight: 230 lb 6.1 oz (104.5 kg) IBW/kg (Calculated) : 61.5  Heparin Dosing Weight: 85.6 kg  Vital Signs: Temp: 97.5 F (36.4 C) (12/06 0630) Temp src: Oral (12/06 0630) BP: 114/62 mmHg (12/06 1046) Pulse Rate: 64  (12/06 0630)  Labs:  Basename 11/11/11 1050 11/11/11 0044  HGB -- 15.4  HCT -- 43.7  PLT -- 216  APTT -- --  LABPROT -- --  INR -- --  HEPARINUNFRC 0.19* <0.10*  CREATININE -- 0.88  CKTOTAL -- --  CKMB -- --  TROPONINI -- --   Estimated Creatinine Clearance: 98.1 ml/min (by C-G formula based on Cr of 0.88).   Assessment: 60 y.o. M s/p cath. Awaiting CABG for CAD -planned for tomorrow. Heparin level subtherapeutic. No bleeding noted.  Goal of Therapy:  Heparin level 0.3-0.7 units/ml   Plan:  1. Increaseheparin drip to rate of 1600 units/hr (16 ml/hr) 3. Will continue to monitor for any signs/symptoms of bleeding and will follow up with heparin level in 6 hours   Selma Rodelo, Hilario Quarry 11/11/2011,12:16 PM

## 2011-11-11 NOTE — Progress Notes (Signed)
Heparin level sub therapeutic at < 0.10.  Pt. Is post cardiac catheterization with multiple LAD occlusions.  No complaint of SOB or chest pain since assignment to patient at 20:45.  Notified pharmacist, Tammy Sours Abbott of low heparin level.  New orders pending.  Continuing to monitor.

## 2011-11-11 NOTE — Progress Notes (Signed)
*  PRELIMINARY RESULTS*  Carotid Dopplers for Pre-Cabg  has been performed.  No significant ICA stenosis bilaterally. Vertebral arteries are patent with antegrade flow.  [name] 11/11/2011, 10:03 AM

## 2011-11-11 NOTE — Progress Notes (Signed)
ANTICOAGULATION CONSULT NOTE - Follow Up Consult  Pharmacy Consult for heparin Indication: chest pain/ACS  Allergies  Allergen Reactions  . Doxazosin Mesylate     REACTION: didn't work and may have caused SOB  . Doxycycline     REACTION: unspecified  . Glimepiride     REACTION: red eyes  . Glipizide     REACTION: myalgia??  . Losartan Potassium-Hctz     REACTION: chest \\T \ leg pain  . Rosiglitazone Maleate     REACTION: myalgia  . Simvastatin     REACTION: myalgias    Patient Measurements: Height: 5\' 5"  (165.1 cm) Weight: 230 lb 6.1 oz (104.5 kg) IBW/kg (Calculated) : 61.5  Adjusted Body Weight: 85.2kg  Vital Signs: Temp: 98 F (36.7 C) (12/06 1400) Temp src: Oral (12/06 1400) BP: 122/79 mmHg (12/06 1400) Pulse Rate: 74  (12/06 1400)  Labs:  Basename 11/11/11 1600 11/11/11 1050 11/11/11 0044  HGB -- -- 15.4  HCT -- -- 43.7  PLT -- -- 216  APTT 54* -- --  LABPROT -- -- --  INR -- -- --  HEPARINUNFRC 0.21* 0.19* <0.10*  CREATININE 0.95 -- 0.88  CKTOTAL -- -- --  CKMB -- -- --  TROPONINI -- -- --   Estimated Creatinine Clearance: 90.9 ml/min (by C-G formula based on Cr of 0.95).   Medications:  Scheduled:    . aminocaproic acid (AMICAR) for OHS   Intravenous To OR  . aspirin  81 mg Oral Daily  . atorvastatin  20 mg Oral QHS  . bisacodyl  5 mg Oral Once  . cefUROXime (ZINACEF)  IV  1.5 g Intravenous To OR  . cefUROXime (ZINACEF)  IV  750 mg Intravenous To OR  . chlorhexidine  60 mL Topical Once  . chlorhexidine  60 mL Topical Once  . dexmedetomidine (PRECEDEX) IV infusion for high rates  0.1-0.7 mcg/kg/hr Intravenous To OR  . DOPamine  2-20 mcg/kg/min Intravenous To OR  . epinephrine  0.5-20 mcg/min Intravenous To OR  . ezetimibe  10 mg Oral Daily  . heparin-papaverine-plasmalyte irrigation   Irrigation To OR  . hydrochlorothiazide  25 mg Oral Daily  . insulin aspart  0-9 Units Subcutaneous TID WC  . insulin (NOVOLIN-R) infusion   Intravenous To OR    . magnesium sulfate  40 mEq Other To OR  . metoprolol tartrate  12.5 mg Oral Once  . metoprolol tartrate  25 mg Oral BID  . nitroGLYCERIN  2-200 mcg/min Intravenous To OR  . olmesartan  20 mg Oral Daily  . phenylephrine (NEO-SYNEPHRINE) Adult infusion  30-200 mcg/min Intravenous To OR  . pioglitazone  30 mg Oral QAC breakfast  . potassium chloride  80 mEq Other To OR  . sodium chloride  3 mL Intravenous Q12H  . vancomycin  1,500 mg Intravenous To OR  . DISCONTD: chlorhexidine  60 mL Topical Once   Infusions:    . heparin 1,600 Units/hr (11/11/11 1506)    Assessment: 60 yo male with ACS is currently on subthreapeutic heparin. Goal of Therapy:  Heparin level 0.3-0.7 units/ml   Plan:  1) Increase heparin to 1800 units/hr (=81ml/hr) 2) Check a 6hr heparin level.  Saanvi Hakala, Tsz-Yin 11/11/2011,8:28 PM

## 2011-11-11 NOTE — Progress Notes (Signed)
I have seen and examined the patient. I agree with the above note with the addition of : The patient requested whether he can go home and return to have CABG as an outpatient. 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 I strongly advised him against that due to the severity of the disease in the left anterior descending artery and diagonals and the risks of myocardial infarction. After a prolonged discussion with him he agreed to stay to have the surgery done tomorrow. I called Dr. Tyrone Sage and discussed the case with him as well. The patient is agreeable to proceed with the surgery tomorrow.  Lorine Bears 11/11/2011 2:03 PM

## 2011-11-11 NOTE — Progress Notes (Signed)
RT Note: Pt refused to wear CPAP tonight. 

## 2011-11-12 ENCOUNTER — Encounter (HOSPITAL_COMMUNITY): Payer: Self-pay | Admitting: Anesthesiology

## 2011-11-12 ENCOUNTER — Inpatient Hospital Stay (HOSPITAL_COMMUNITY): Payer: BC Managed Care – PPO | Admitting: Anesthesiology

## 2011-11-12 ENCOUNTER — Other Ambulatory Visit: Payer: Self-pay

## 2011-11-12 ENCOUNTER — Inpatient Hospital Stay (HOSPITAL_COMMUNITY): Payer: BC Managed Care – PPO

## 2011-11-12 ENCOUNTER — Encounter (HOSPITAL_COMMUNITY)
Admission: AD | Disposition: A | Discharge status: Home / Self Care | Payer: Self-pay | Source: Ambulatory Visit | Attending: Cardiothoracic Surgery

## 2011-11-12 DIAGNOSIS — I2510 Atherosclerotic heart disease of native coronary artery without angina pectoris: Secondary | ICD-10-CM

## 2011-11-12 HISTORY — PX: CORONARY ARTERY BYPASS GRAFT: SHX141

## 2011-11-12 HISTORY — DX: Atherosclerotic heart disease of native coronary artery without angina pectoris: I25.10

## 2011-11-12 LAB — CBC
HCT: 39.2 % (ref 39.0–52.0)
HCT: 34.1 % — ABNORMAL LOW (ref 39.0–52.0)
Hemoglobin: 13.6 g/dL (ref 13.0–17.0)
Hemoglobin: 11.7 g/dL — ABNORMAL LOW (ref 13.0–17.0)
MCH: 30 pg (ref 26.0–34.0)
MCH: 30.3 pg (ref 26.0–34.0)
MCH: 29.8 pg (ref 26.0–34.0)
MCHC: 34.4 g/dL (ref 30.0–36.0)
MCHC: 34.3 g/dL (ref 30.0–36.0)
MCV: 87.3 fL (ref 78.0–100.0)
MCV: 87 fL (ref 78.0–100.0)
Platelets: 154 10*3/uL (ref 150–400)
Platelets: 146 10*3/uL — ABNORMAL LOW (ref 150–400)
RBC: 4.49 MIL/uL (ref 4.22–5.81)
RBC: 3.92 MIL/uL — ABNORMAL LOW (ref 4.22–5.81)
RDW: 13 % (ref 11.5–15.5)
RDW: 13.1 % (ref 11.5–15.5)
WBC: 16.1 10*3/uL — ABNORMAL HIGH (ref 4.0–10.5)

## 2011-11-12 LAB — POCT I-STAT 3, ART BLOOD GAS (G3+)
Acid-base deficit: 2 mmol/L (ref 0.0–2.0)
Acid-base deficit: 1 mmol/L (ref 0.0–2.0)
Acid-base deficit: 1 mmol/L (ref 0.0–2.0)
Bicarbonate: 26.9 mEq/L — ABNORMAL HIGH (ref 20.0–24.0)
Bicarbonate: 24 mEq/L (ref 20.0–24.0)
Bicarbonate: 24.4 mEq/L — ABNORMAL HIGH (ref 20.0–24.0)
Bicarbonate: 24.7 mEq/L — ABNORMAL HIGH (ref 20.0–24.0)
O2 Saturation: 100 %
O2 Saturation: 90 %
O2 Saturation: 94 %
O2 Saturation: 95 %
Patient temperature: 36.3
Patient temperature: 36.9
Patient temperature: 37.4
TCO2: 28 mmol/L (ref 0–100)
TCO2: 26 mmol/L (ref 0–100)
TCO2: 26 mmol/L (ref 0–100)
pCO2 arterial: 42.9 mmHg (ref 35.0–45.0)
pCO2 arterial: 39.6 mmHg (ref 35.0–45.0)
pH, Arterial: 7.384 (ref 7.350–7.450)
pO2, Arterial: 52 mmHg — ABNORMAL LOW (ref 80.0–100.0)
pO2, Arterial: 74 mmHg — ABNORMAL LOW (ref 80.0–100.0)
pO2, Arterial: 81 mmHg (ref 80.0–100.0)

## 2011-11-12 LAB — POCT I-STAT, CHEM 8
BUN: 20 mg/dL (ref 6–23)
Calcium, Ion: 1.17 mmol/L (ref 1.12–1.32)
Chloride: 108 mEq/L (ref 96–112)
Creatinine, Ser: 1.1 mg/dL (ref 0.50–1.35)
Glucose, Bld: 128 mg/dL — ABNORMAL HIGH (ref 70–99)

## 2011-11-12 LAB — POCT I-STAT 4, (NA,K, GLUC, HGB,HCT)
Glucose, Bld: 129 mg/dL — ABNORMAL HIGH (ref 70–99)
Glucose, Bld: 117 mg/dL — ABNORMAL HIGH (ref 70–99)
Glucose, Bld: 194 mg/dL — ABNORMAL HIGH (ref 70–99)
Glucose, Bld: 203 mg/dL — ABNORMAL HIGH (ref 70–99)
HCT: 40 % (ref 39.0–52.0)
HCT: 39 % (ref 39.0–52.0)
HCT: 34 % — ABNORMAL LOW (ref 39.0–52.0)
HCT: 39 % (ref 39.0–52.0)
Hemoglobin: 13.3 g/dL (ref 13.0–17.0)
Hemoglobin: 10.9 g/dL — ABNORMAL LOW (ref 13.0–17.0)
Hemoglobin: 13.3 g/dL (ref 13.0–17.0)
Potassium: 2.9 mEq/L — ABNORMAL LOW (ref 3.5–5.1)
Potassium: 3.5 mEq/L (ref 3.5–5.1)
Potassium: 3.4 mEq/L — ABNORMAL LOW (ref 3.5–5.1)
Sodium: 139 mEq/L (ref 135–145)
Sodium: 138 mEq/L (ref 135–145)
Sodium: 139 mEq/L (ref 135–145)

## 2011-11-12 LAB — PLATELET COUNT: Platelets: 167 10*3/uL (ref 150–400)

## 2011-11-12 LAB — HEPARIN LEVEL (UNFRACTIONATED): Heparin Unfractionated: 0.36 IU/mL (ref 0.30–0.70)

## 2011-11-12 LAB — HEMOGLOBIN A1C
Hgb A1c MFr Bld: 8.4 % — ABNORMAL HIGH (ref <5.7)
Mean Plasma Glucose: 194 mg/dL — ABNORMAL HIGH (ref <117)

## 2011-11-12 LAB — GLUCOSE, CAPILLARY
Glucose-Capillary: 256 mg/dL — ABNORMAL HIGH (ref 70–99)
Glucose-Capillary: 193 mg/dL — ABNORMAL HIGH (ref 70–99)
Glucose-Capillary: 152 mg/dL — ABNORMAL HIGH (ref 70–99)

## 2011-11-12 LAB — HEMOGLOBIN AND HEMATOCRIT, BLOOD
HCT: 32.7 % — ABNORMAL LOW (ref 39.0–52.0)
Hemoglobin: 11 g/dL — ABNORMAL LOW (ref 13.0–17.0)

## 2011-11-12 LAB — BASIC METABOLIC PANEL
BUN: 21 mg/dL (ref 6–23)
Calcium: 9.2 mg/dL (ref 8.4–10.5)
Creatinine, Ser: 0.94 mg/dL (ref 0.50–1.35)
GFR calc Af Amer: >90 mL/min (ref >90)
GFR calc non Af Amer: 88 mL/min — ABNORMAL LOW (ref >90)

## 2011-11-12 LAB — MAGNESIUM: Magnesium: 2.6 mg/dL — ABNORMAL HIGH (ref 1.5–2.5)

## 2011-11-12 LAB — CREATININE, SERUM
Creatinine, Ser: 0.78 mg/dL (ref 0.50–1.35)
GFR calc Af Amer: >90 mL/min (ref >90)
GFR calc non Af Amer: >90 mL/min (ref >90)

## 2011-11-12 LAB — MRSA PCR SCREENING: MRSA by PCR: NEGATIVE

## 2011-11-12 LAB — APTT: aPTT: 31 seconds (ref 24–37)

## 2011-11-12 SURGERY — CORONARY ARTERY BYPASS GRAFTING (CABG)
Anesthesia: General | Site: Chest | Wound class: Clean

## 2011-11-12 MED ORDER — DOCUSATE SODIUM 100 MG PO CAPS
200.0000 mg | ORAL_CAPSULE | Freq: Every day | ORAL | Status: DC
  Administered 2011-11-13 – 2011-11-14 (×2): 200 mg via ORAL
  Filled 2011-11-12 (×2): qty 2

## 2011-11-12 MED ORDER — SODIUM CHLORIDE 0.45 % IV SOLN
INTRAVENOUS | Status: DC
  Administered 2011-11-12: 14:00:00 via INTRAVENOUS

## 2011-11-12 MED ORDER — SODIUM CHLORIDE 0.9 % IV SOLN
INTRAVENOUS | Status: DC | PRN
  Administered 2011-11-12: 12:00:00 via INTRAVENOUS

## 2011-11-12 MED ORDER — PHENYLEPHRINE HCL 10 MG/ML IJ SOLN
0.0000 ug/min | INTRAVENOUS | Status: DC
  Filled 2011-11-12: qty 2

## 2011-11-12 MED ORDER — ONDANSETRON HCL 4 MG/2ML IJ SOLN
4.0000 mg | Freq: Four times a day (QID) | INTRAMUSCULAR | Status: DC | PRN
  Administered 2011-11-13: 4 mg via INTRAVENOUS
  Filled 2011-11-12: qty 2

## 2011-11-12 MED ORDER — METOPROLOL TARTRATE 1 MG/ML IV SOLN: 2.5000 mg | INTRAVENOUS | Status: DC | PRN

## 2011-11-12 MED ORDER — ACETAMINOPHEN 650 MG RE SUPP
650.0000 mg | RECTAL | Status: AC
  Administered 2011-11-12: 650 mg via RECTAL

## 2011-11-12 MED ORDER — VANCOMYCIN HCL 1000 MG IV SOLR
1000.0000 mg | Freq: Once | INTRAVENOUS | Status: AC
  Administered 2011-11-12: 1000 mg via INTRAVENOUS
  Filled 2011-11-12: qty 1000

## 2011-11-12 MED ORDER — DEXTROSE 5 % IV SOLN
1.5000 g | INTRAVENOUS | Status: DC | PRN
  Administered 2011-11-12: 1.5 g via INTRAVENOUS

## 2011-11-12 MED ORDER — SODIUM CHLORIDE 0.9 % IV SOLN
10.0000 g | INTRAVENOUS | Status: DC | PRN
  Administered 2011-11-12: 1 g/h via INTRAVENOUS

## 2011-11-12 MED ORDER — FENTANYL CITRATE 0.05 MG/ML IJ SOLN
INTRAMUSCULAR | Status: DC | PRN
  Administered 2011-11-12 (×2): 250 ug via INTRAVENOUS
  Administered 2011-11-12: 50 ug via INTRAVENOUS
  Administered 2011-11-12: 200 ug via INTRAVENOUS
  Administered 2011-11-12: 250 ug via INTRAVENOUS

## 2011-11-12 MED ORDER — ACETAMINOPHEN 500 MG PO TABS
1000.0000 mg | ORAL_TABLET | Freq: Four times a day (QID) | ORAL | Status: DC
  Administered 2011-11-13 – 2011-11-15 (×8): 1000 mg via ORAL
  Filled 2011-11-12 (×14): qty 2

## 2011-11-12 MED ORDER — SODIUM CHLORIDE 0.9 % IJ SOLN
OROMUCOSAL | Status: DC | PRN
  Administered 2011-11-12 (×3): via TOPICAL

## 2011-11-12 MED ORDER — METOPROLOL TARTRATE 12.5 MG HALF TABLET
12.5000 mg | ORAL_TABLET | Freq: Two times a day (BID) | ORAL | Status: DC
  Administered 2011-11-14: 12.5 mg via ORAL
  Filled 2011-11-12 (×7): qty 1

## 2011-11-12 MED ORDER — ACETAMINOPHEN 160 MG/5ML PO SOLN: 650.0000 mg | ORAL | Status: AC

## 2011-11-12 MED ORDER — DOPAMINE-DEXTROSE 3.2-5 MG/ML-% IV SOLN
INTRAVENOUS | Status: DC | PRN
  Administered 2011-11-12: 3 ug/kg/min via INTRAVENOUS

## 2011-11-12 MED ORDER — HEPARIN SODIUM (PORCINE) 1000 UNIT/ML IJ SOLN
INTRAMUSCULAR | Status: DC | PRN
  Administered 2011-11-12: 28000 [IU] via INTRAVENOUS

## 2011-11-12 MED ORDER — INSULIN ASPART 100 UNIT/ML ~~LOC~~ SOLN
8.0000 [IU] | Freq: Once | SUBCUTANEOUS | Status: AC
  Administered 2011-11-12: 8 [IU] via SUBCUTANEOUS

## 2011-11-12 MED ORDER — SODIUM CHLORIDE 0.9 % IV SOLN
0.1000 ug/kg/h | INTRAVENOUS | Status: DC
  Filled 2011-11-12 (×2): qty 2

## 2011-11-12 MED ORDER — ACETAMINOPHEN 160 MG/5ML PO SOLN
975.0000 mg | Freq: Four times a day (QID) | ORAL | Status: DC
  Filled 2011-11-12: qty 40.6

## 2011-11-12 MED ORDER — LACTATED RINGERS IV SOLN
INTRAVENOUS | Status: DC | PRN
  Administered 2011-11-12 (×2): via INTRAVENOUS

## 2011-11-12 MED ORDER — HEMOSTATIC AGENTS (NO CHARGE) OPTIME
TOPICAL | Status: DC | PRN
  Administered 2011-11-12: 1 via TOPICAL

## 2011-11-12 MED ORDER — FAMOTIDINE IN NACL 20-0.9 MG/50ML-% IV SOLN
20.0000 mg | Freq: Two times a day (BID) | INTRAVENOUS | Status: AC
  Administered 2011-11-12: 20 mg via INTRAVENOUS

## 2011-11-12 MED ORDER — SODIUM CHLORIDE 0.9 % IV SOLN: 250.0000 mL | INTRAVENOUS | Status: DC

## 2011-11-12 MED ORDER — PANTOPRAZOLE SODIUM 40 MG PO TBEC
40.0000 mg | DELAYED_RELEASE_TABLET | Freq: Every day | ORAL | Status: DC
  Administered 2011-11-14: 40 mg via ORAL
  Filled 2011-11-12: qty 1

## 2011-11-12 MED ORDER — LACTATED RINGERS IV SOLN
INTRAVENOUS | Status: DC | PRN
  Administered 2011-11-12 (×2): via INTRAVENOUS

## 2011-11-12 MED ORDER — MIDAZOLAM HCL 2 MG/2ML IJ SOLN: 2.0000 mg | INTRAMUSCULAR | Status: DC | PRN

## 2011-11-12 MED ORDER — PHENYLEPHRINE HCL 10 MG/ML IJ SOLN
10.0000 mg | INTRAMUSCULAR | Status: DC | PRN
  Administered 2011-11-12: 25 ug/min via INTRAVENOUS

## 2011-11-12 MED ORDER — PROPOFOL 10 MG/ML IV EMUL
INTRAVENOUS | Status: DC | PRN
  Administered 2011-11-12: 200 mg via INTRAVENOUS

## 2011-11-12 MED ORDER — VECURONIUM BROMIDE 10 MG IV SOLR
INTRAVENOUS | Status: DC | PRN
  Administered 2011-11-12 (×2): 5 mg via INTRAVENOUS

## 2011-11-12 MED ORDER — LACTATED RINGERS IV SOLN
INTRAVENOUS | Status: DC | PRN
  Administered 2011-11-12: 07:00:00 via INTRAVENOUS

## 2011-11-12 MED ORDER — MAGNESIUM SULFATE 50 % IJ SOLN
4.0000 g | Freq: Once | INTRAVENOUS | Status: AC
  Administered 2011-11-12: 4 g via INTRAVENOUS
  Filled 2011-11-12: qty 8

## 2011-11-12 MED ORDER — PAPAVERINE HCL 30 MG/ML IJ SOLN
INTRAMUSCULAR | Status: DC | PRN
  Administered 2011-11-12: 60 mg via INTRAVENOUS

## 2011-11-12 MED ORDER — MORPHINE SULFATE 4 MG/ML IJ SOLN
2.0000 mg | INTRAMUSCULAR | Status: DC | PRN
  Administered 2011-11-12 – 2011-11-13 (×2): 2 mg via INTRAVENOUS
  Administered 2011-11-13: 4 mg via INTRAVENOUS
  Filled 2011-11-12 (×5): qty 1

## 2011-11-12 MED ORDER — SODIUM CHLORIDE 0.9 % IJ SOLN
3.0000 mL | INTRAMUSCULAR | Status: DC | PRN
  Administered 2011-11-13: 3 mL via INTRAVENOUS

## 2011-11-12 MED ORDER — ASPIRIN 81 MG PO CHEW: 324.0000 mg | CHEWABLE_TABLET | Freq: Every day | ORAL | Status: DC

## 2011-11-12 MED ORDER — MIDAZOLAM HCL 5 MG/5ML IJ SOLN
INTRAMUSCULAR | Status: DC | PRN
  Administered 2011-11-12: 2 mg via INTRAVENOUS
  Administered 2011-11-12: 3 mg via INTRAVENOUS
  Administered 2011-11-12: 2 mg via INTRAVENOUS
  Administered 2011-11-12: 3 mg via INTRAVENOUS

## 2011-11-12 MED ORDER — SODIUM CHLORIDE 0.9 % IV SOLN
100.0000 [IU] | INTRAVENOUS | Status: DC | PRN
  Administered 2011-11-12: 1 [IU]/h via INTRAVENOUS

## 2011-11-12 MED ORDER — DEXTROSE 5 % IV SOLN
1.5000 g | Freq: Two times a day (BID) | INTRAVENOUS | Status: AC
  Administered 2011-11-12 – 2011-11-14 (×4): 1.5 g via INTRAVENOUS
  Filled 2011-11-12 (×4): qty 1.5

## 2011-11-12 MED ORDER — OXYCODONE HCL 5 MG PO TABS
5.0000 mg | ORAL_TABLET | ORAL | Status: DC | PRN
  Administered 2011-11-13 (×4): 10 mg via ORAL
  Administered 2011-11-14: 5 mg via ORAL
  Administered 2011-11-14 – 2011-11-15 (×5): 10 mg via ORAL
  Filled 2011-11-12 (×5): qty 2
  Filled 2011-11-12: qty 1
  Filled 2011-11-12 (×4): qty 2

## 2011-11-12 MED ORDER — NITROGLYCERIN IN D5W 200-5 MCG/ML-% IV SOLN
0.0000 ug/min | INTRAVENOUS | Status: DC
Start: 2011-11-12 — End: 2011-11-15

## 2011-11-12 MED ORDER — BISACODYL 10 MG RE SUPP: 10.0000 mg | Freq: Every day | RECTAL | Status: DC

## 2011-11-12 MED ORDER — SODIUM CHLORIDE 0.9 % IV SOLN
INTRAVENOUS | Status: DC
  Administered 2011-11-13: 09:00:00 via INTRAVENOUS
  Filled 2011-11-12: qty 1

## 2011-11-12 MED ORDER — ROCURONIUM BROMIDE 100 MG/10ML IV SOLN
INTRAVENOUS | Status: DC | PRN
  Administered 2011-11-12 (×2): 50 mg via INTRAVENOUS

## 2011-11-12 MED ORDER — VANCOMYCIN HCL 1000 MG IV SOLR
1000.0000 mg | INTRAVENOUS | Status: DC | PRN
  Administered 2011-11-12: 1500 mg via INTRAVENOUS

## 2011-11-12 MED ORDER — SODIUM CHLORIDE 0.9 % IV SOLN
INTRAVENOUS | Status: DC
  Administered 2011-11-12: 14:00:00 via INTRAVENOUS

## 2011-11-12 MED ORDER — ALBUMIN HUMAN 5 % IV SOLN
250.0000 mL | INTRAVENOUS | Status: AC | PRN
  Administered 2011-11-12 (×2): 250 mL via INTRAVENOUS
  Filled 2011-11-12 (×2): qty 250

## 2011-11-12 MED ORDER — METOPROLOL TARTRATE 25 MG/10 ML ORAL SUSPENSION
12.5000 mg | Freq: Two times a day (BID) | ORAL | Status: DC
  Administered 2011-11-14: 12.5 mg
  Filled 2011-11-12 (×7): qty 5

## 2011-11-12 MED ORDER — MORPHINE SULFATE 2 MG/ML IJ SOLN
1.0000 mg | INTRAMUSCULAR | Status: AC | PRN
Start: 2011-11-12 — End: 2011-11-13
  Administered 2011-11-12 (×2): 1 mg via INTRAVENOUS
  Filled 2011-11-12: qty 1

## 2011-11-12 MED ORDER — BISACODYL 5 MG PO TBEC
10.0000 mg | DELAYED_RELEASE_TABLET | Freq: Every day | ORAL | Status: DC
  Administered 2011-11-13 – 2011-11-14 (×2): 10 mg via ORAL
  Filled 2011-11-12 (×2): qty 2

## 2011-11-12 MED ORDER — DEXTROSE 5 % IV SOLN
0.7500 g | INTRAVENOUS | Status: DC | PRN
  Administered 2011-11-12: .75 g via INTRAVENOUS

## 2011-11-12 MED ORDER — PROTAMINE SULFATE 10 MG/ML IV SOLN
INTRAVENOUS | Status: DC | PRN
  Administered 2011-11-12: 50 mg via INTRAVENOUS
  Administered 2011-11-12: 20 mg via INTRAVENOUS
  Administered 2011-11-12: 50 mg via INTRAVENOUS
  Administered 2011-11-12: 30 mg via INTRAVENOUS
  Administered 2011-11-12 (×2): 50 mg via INTRAVENOUS

## 2011-11-12 MED ORDER — LACTATED RINGERS IV SOLN
INTRAVENOUS | Status: DC
  Administered 2011-11-12: 18:00:00 via INTRAVENOUS

## 2011-11-12 MED ORDER — NITROGLYCERIN IN D5W 200-5 MCG/ML-% IV SOLN
INTRAVENOUS | Status: DC | PRN
  Administered 2011-11-12: 16.6 ug/min via INTRAVENOUS

## 2011-11-12 MED ORDER — LACTATED RINGERS IV SOLN
500.0000 mL | Freq: Once | INTRAVENOUS | Status: AC | PRN
  Administered 2011-11-12: 500 mL via INTRAVENOUS

## 2011-11-12 MED ORDER — POTASSIUM CHLORIDE 10 MEQ/50ML IV SOLN
10.0000 meq | INTRAVENOUS | Status: AC
  Administered 2011-11-12 (×3): 10 meq via INTRAVENOUS

## 2011-11-12 MED ORDER — ASPIRIN EC 325 MG PO TBEC
325.0000 mg | DELAYED_RELEASE_TABLET | Freq: Every day | ORAL | Status: DC
  Administered 2011-11-13 – 2011-11-14 (×2): 325 mg via ORAL
  Filled 2011-11-12 (×3): qty 1

## 2011-11-12 MED ORDER — SODIUM CHLORIDE 0.9 % IV SOLN
200.0000 ug | INTRAVENOUS | Status: DC | PRN
  Administered 2011-11-12: 0.2 ug/kg/h via INTRAVENOUS

## 2011-11-12 MED ORDER — SODIUM CHLORIDE 0.9 % IJ SOLN
3.0000 mL | Freq: Two times a day (BID) | INTRAMUSCULAR | Status: DC
  Administered 2011-11-13 – 2011-11-14 (×4): 3 mL via INTRAVENOUS

## 2011-11-12 SURGICAL SUPPLY — 119 items
ATTRACTOMAT 16X20 MAGNETIC DRP (DRAPES) ×2 IMPLANT
BAG DECANTER FOR FLEXI CONT (MISCELLANEOUS) ×2 IMPLANT
BANDAGE ELASTIC 4 VELCRO ST LF (GAUZE/BANDAGES/DRESSINGS) ×2 IMPLANT
BANDAGE ELASTIC 6 VELCRO ST LF (GAUZE/BANDAGES/DRESSINGS) ×2 IMPLANT
BANDAGE GAUZE ELAST BULKY 4 IN (GAUZE/BANDAGES/DRESSINGS) ×2 IMPLANT
BLADE SAW STERNAL (BLADE) ×2 IMPLANT
BLADE SURG ROTATE 9660 (MISCELLANEOUS) ×2 IMPLANT
CANISTER SUCTION 2500CC (MISCELLANEOUS) ×2 IMPLANT
CANN PRFSN .5XCNCT 15X34-48 (MISCELLANEOUS) ×1
CANNULA PRFSN .5XCNCT 15X34-48 (MISCELLANEOUS) ×1 IMPLANT
CANNULA VEN 2 STAGE (MISCELLANEOUS) ×1
CANNULA VESSEL W/WING WO/VALVE (CANNULA) IMPLANT
CATH CPB KIT GERHARDT (MISCELLANEOUS) ×2 IMPLANT
CATH ROBINSON RED A/P 18FR (CATHETERS) IMPLANT
CATH THORACIC 28FR (CATHETERS) ×2 IMPLANT
CATH THORACIC 28FR RT ANG (CATHETERS) IMPLANT
CATH THORACIC 36FR (CATHETERS) IMPLANT
CATH THORACIC 36FR RT ANG (CATHETERS) IMPLANT
CLIP FOGARTY SPRING 6M (CLIP) IMPLANT
CLIP TI MEDIUM 24 (CLIP) IMPLANT
CLIP TI WIDE RED SMALL 24 (CLIP) IMPLANT
CLOTH BEACON ORANGE TIMEOUT ST (SAFETY) ×2 IMPLANT
COVER SURGICAL LIGHT HANDLE (MISCELLANEOUS) ×4 IMPLANT
CRADLE DONUT ADULT HEAD (MISCELLANEOUS) ×2 IMPLANT
DERMABOND ADVANCED (GAUZE/BANDAGES/DRESSINGS) ×1
DERMABOND ADVANCED .7 DNX12 (GAUZE/BANDAGES/DRESSINGS) ×1 IMPLANT
DRAIN CHANNEL 28F RND 3/8 FF (WOUND CARE) ×2 IMPLANT
DRAIN CHANNEL 32F RND 10.7 FF (WOUND CARE) IMPLANT
DRAPE CARDIOVASCULAR INCISE (DRAPES) ×1
DRAPE SLUSH MACHINE 52X66 (DRAPES) ×2 IMPLANT
DRAPE SLUSH/WARMER DISC (DRAPES) IMPLANT
DRAPE SRG 135X102X78XABS (DRAPES) ×1 IMPLANT
DRAPE SURG 17X23 STRL (DRAPES) ×2 IMPLANT
DRSG COVADERM 4X14 (GAUZE/BANDAGES/DRESSINGS) ×2 IMPLANT
ELECT BLADE 4.0 EZ CLEAN MEGAD (MISCELLANEOUS) ×2
ELECT CAUTERY BLADE 6.4 (BLADE) ×2 IMPLANT
ELECT REM PT RETURN 9FT ADLT (ELECTROSURGICAL) ×4
ELECTRODE BLDE 4.0 EZ CLN MEGD (MISCELLANEOUS) ×1 IMPLANT
ELECTRODE REM PT RTRN 9FT ADLT (ELECTROSURGICAL) ×2 IMPLANT
GLOVE BIO SURGEON STRL SZ 6 (GLOVE) ×8 IMPLANT
GLOVE BIO SURGEON STRL SZ 6.5 (GLOVE) ×22 IMPLANT
GLOVE BIO SURGEON STRL SZ7 (GLOVE) ×4 IMPLANT
GLOVE BIO SURGEON STRL SZ7.5 (GLOVE) IMPLANT
GLOVE BIOGEL PI IND STRL 6 (GLOVE) ×4 IMPLANT
GLOVE BIOGEL PI IND STRL 6.5 (GLOVE) ×2 IMPLANT
GLOVE BIOGEL PI IND STRL 7.0 (GLOVE) ×4 IMPLANT
GLOVE BIOGEL PI INDICATOR 6 (GLOVE) ×4
GLOVE BIOGEL PI INDICATOR 6.5 (GLOVE) ×2
GLOVE BIOGEL PI INDICATOR 7.0 (GLOVE) ×4
GLOVE EUDERMIC 7 POWDERFREE (GLOVE) IMPLANT
GLOVE ORTHO TXT STRL SZ7.5 (GLOVE) IMPLANT
GOWN STRL NON-REIN LRG LVL3 (GOWN DISPOSABLE) ×18 IMPLANT
HEMOSTAT POWDER SURGIFOAM 1G (HEMOSTASIS) ×6 IMPLANT
HEMOSTAT SURGICEL 2X14 (HEMOSTASIS) ×6 IMPLANT
INSERT FOGARTY 61MM (MISCELLANEOUS) IMPLANT
INSERT FOGARTY XLG (MISCELLANEOUS) IMPLANT
KIT BASIN OR (CUSTOM PROCEDURE TRAY) ×2 IMPLANT
KIT ROOM TURNOVER OR (KITS) ×2 IMPLANT
KIT SUCTION CATH 14FR (SUCTIONS) ×4 IMPLANT
KIT VASOVIEW W/TROCAR VH 2000 (KITS) ×2 IMPLANT
LEAD PACING MYOCARDI (MISCELLANEOUS) ×2 IMPLANT
MARKER GRAFT CORONARY BYPASS (MISCELLANEOUS) ×6 IMPLANT
NS IRRIG 1000ML POUR BTL (IV SOLUTION) ×14 IMPLANT
PACK OPEN HEART (CUSTOM PROCEDURE TRAY) ×2 IMPLANT
PAD ARMBOARD 7.5X6 YLW CONV (MISCELLANEOUS) ×4 IMPLANT
PENCIL BUTTON HOLSTER BLD 10FT (ELECTRODE) ×2 IMPLANT
PUNCH AORTIC ROTATE 4.5MM 8IN (MISCELLANEOUS) IMPLANT
PUNCH AORTIC ROTATE 5MM 8IN (MISCELLANEOUS) IMPLANT
SET CARDIOPLEGIA MPS 5001102 (MISCELLANEOUS) ×2 IMPLANT
SOLUTION ANTI FOG 6CC (MISCELLANEOUS) IMPLANT
SPONGE GAUZE 4X4 12PLY (GAUZE/BANDAGES/DRESSINGS) ×4 IMPLANT
SPONGE INTESTINAL PEANUT (DISPOSABLE) IMPLANT
SPONGE LAP 18X18 X RAY DECT (DISPOSABLE) ×4 IMPLANT
SPONGE LAP 4X18 X RAY DECT (DISPOSABLE) IMPLANT
SUT BONE WAX W31G (SUTURE) IMPLANT
SUT MNCRL AB 4-0 PS2 18 (SUTURE) ×2 IMPLANT
SUT PROLENE 3 0 SH 1 (SUTURE) ×2 IMPLANT
SUT PROLENE 3 0 SH DA (SUTURE) IMPLANT
SUT PROLENE 3 0 SH1 36 (SUTURE) IMPLANT
SUT PROLENE 4 0 RB 1 (SUTURE)
SUT PROLENE 4 0 SH DA (SUTURE) IMPLANT
SUT PROLENE 4 0 TF (SUTURE) IMPLANT
SUT PROLENE 4-0 RB1 .5 CRCL 36 (SUTURE) IMPLANT
SUT PROLENE 5 0 C 1 36 (SUTURE) IMPLANT
SUT PROLENE 6 0 C 1 30 (SUTURE) IMPLANT
SUT PROLENE 6 0 CC (SUTURE) ×4 IMPLANT
SUT PROLENE 7 0 BV 1 (SUTURE) IMPLANT
SUT PROLENE 7 0 BV1 MDA (SUTURE) ×2 IMPLANT
SUT PROLENE 7.0 RB 3 (SUTURE) IMPLANT
SUT PROLENE 8 0 BV175 6 (SUTURE) ×10 IMPLANT
SUT SILK  1 MH (SUTURE)
SUT SILK 1 MH (SUTURE) IMPLANT
SUT SILK 2 0 SH CR/8 (SUTURE) IMPLANT
SUT SILK 3 0 SH CR/8 (SUTURE) IMPLANT
SUT STEEL 6MS V (SUTURE) IMPLANT
SUT STEEL STERNAL CCS#1 18IN (SUTURE) ×2 IMPLANT
SUT STEEL SZ 6 DBL 3X14 BALL (SUTURE) ×2 IMPLANT
SUT VIC AB 1 CTX 18 (SUTURE) ×4 IMPLANT
SUT VIC AB 1 CTX 36 (SUTURE)
SUT VIC AB 1 CTX36XBRD ANBCTR (SUTURE) IMPLANT
SUT VIC AB 2-0 CT1 27 (SUTURE) ×2
SUT VIC AB 2-0 CT1 TAPERPNT 27 (SUTURE) ×2 IMPLANT
SUT VIC AB 2-0 CTX 27 (SUTURE) IMPLANT
SUT VIC AB 3-0 SH 27 (SUTURE)
SUT VIC AB 3-0 SH 27X BRD (SUTURE) IMPLANT
SUT VIC AB 3-0 X1 27 (SUTURE) IMPLANT
SUT VICRYL 4-0 PS2 18IN ABS (SUTURE) IMPLANT
SUTURE E-PAK OPEN HEART (SUTURE) ×2 IMPLANT
SYSTEM SAHARA CHEST DRAIN ATS (WOUND CARE) ×2 IMPLANT
TAPE CLOTH SURG 4X10 WHT LF (GAUZE/BANDAGES/DRESSINGS) ×2 IMPLANT
TAPE PAPER 2X10 WHT MICROPORE (GAUZE/BANDAGES/DRESSINGS) ×2 IMPLANT
TOWEL OR 17X24 6PK STRL BLUE (TOWEL DISPOSABLE) ×4 IMPLANT
TOWEL OR 17X26 10 PK STRL BLUE (TOWEL DISPOSABLE) ×4 IMPLANT
TRAY FOLEY IC TEMP SENS 14FR (CATHETERS) ×2 IMPLANT
TUBE FEEDING 8FR 16IN STR KANG (MISCELLANEOUS) ×2 IMPLANT
TUBE SUCT INTRACARD DLP 20F (MISCELLANEOUS) ×2 IMPLANT
TUBING INSUFFLATION 10FT LAP (TUBING) ×2 IMPLANT
UNDERPAD 30X30 INCONTINENT (UNDERPADS AND DIAPERS) ×2 IMPLANT
WATER STERILE IRR 1000ML POUR (IV SOLUTION) ×4 IMPLANT

## 2011-11-12 NOTE — Anesthesia Postprocedure Evaluation (Signed)
  Anesthesia Post-op Note  Patient: [name] [name]  Procedure(s) Performed:  CORONARY ARTERY BYPASS GRAFTING (CABG) - Coronary Artery Bypass Graft times three on pump utilizing left internal mammary artery and right saphenous vein harvested endoscopically   Patient Location: ICU  Anesthesia Type: General  Level of Consciousness: sedated  Airway and Oxygen Therapy: Patient remains intubated per anesthesia plan  Post-op Pain: none  Post-op Assessment: Post-op Vital signs reviewed, Patient's Cardiovascular Status Stable, Respiratory Function Stable and Patent Airway  Post-op Vital Signs: Reviewed and stable  Complications: No apparent anesthesia complications

## 2011-11-12 NOTE — Progress Notes (Signed)
Extubation Note 1735 pt extubated per protocol. Pt awake and alert, placed on 3L [place], sats 97%. NIF -24, VC 700, positive cuff leak, IS 500. Pt able to vocalize, no apparent complications noted.

## 2011-11-12 NOTE — Progress Notes (Signed)
ANTICOAGULATION CONSULT NOTE - Follow Up Consult  Pharmacy Consult for heparin Indication: ACS, awaiting CABG  Allergies  Allergen Reactions  . Doxazosin Mesylate     REACTION: didn't work and may have caused SOB  . Doxycycline     REACTION: unspecified  . Glimepiride     REACTION: red eyes  . Glipizide     REACTION: myalgia??  . Losartan Potassium-Hctz     REACTION: chest \\T \ leg pain  . Rosiglitazone Maleate     REACTION: myalgia  . Simvastatin     REACTION: myalgias   Patient Measurements: Height: 5\' 5"  (165.1 cm) Weight: 230 lb 6.1 oz (104.5 kg) IBW/kg (Calculated) : 61.5  Adjusted Body Weight: 85.2kg  Vital Signs: Temp: 97.3 F (36.3 C) (12/06 2100) Temp src: Oral (12/06 2100) BP: 105/62 mmHg (12/06 2100) Pulse Rate: 70  (12/06 2100)  Labs:  Basename 11/12/11 0229 11/11/11 1600 11/11/11 1050 11/11/11 0044  HGB 15.4 -- -- 15.4  HCT 44.8 -- -- 43.7  PLT 225 -- -- 216  APTT -- 54* -- --  LABPROT -- -- -- --  INR -- -- -- --  HEPARINUNFRC 0.36 0.21* 0.19* --  CREATININE 0.94 0.95 -- 0.88  CKTOTAL -- -- -- --  CKMB -- -- -- --  TROPONINI -- -- -- --   Estimated Creatinine Clearance: 91.9 ml/min (by C-G formula based on Cr of 0.94).  Medications:  Scheduled:    . aminocaproic acid (AMICAR) for OHS   Intravenous To OR  . aspirin  81 mg Oral Daily  . atorvastatin  20 mg Oral QHS  . bisacodyl  5 mg Oral Once  . cefUROXime (ZINACEF)  IV  1.5 g Intravenous To OR  . cefUROXime (ZINACEF)  IV  750 mg Intravenous To OR  . chlorhexidine  60 mL Topical Once  . chlorhexidine  60 mL Topical Once  . dexmedetomidine (PRECEDEX) IV infusion for high rates  0.1-0.7 mcg/kg/hr Intravenous To OR  . DOPamine  2-20 mcg/kg/min Intravenous To OR  . epinephrine  0.5-20 mcg/min Intravenous To OR  . ezetimibe  10 mg Oral Daily  . heparin-papaverine-plasmalyte irrigation   Irrigation To OR  . hydrochlorothiazide  25 mg Oral Daily  . insulin aspart  0-9 Units Subcutaneous TID WC   . insulin (NOVOLIN-R) infusion   Intravenous To OR  . magnesium sulfate  40 mEq Other To OR  . metoprolol tartrate  12.5 mg Oral Once  . metoprolol tartrate  25 mg Oral BID  . nitroGLYCERIN  2-200 mcg/min Intravenous To OR  . olmesartan  20 mg Oral Daily  . phenylephrine (NEO-SYNEPHRINE) Adult infusion  30-200 mcg/min Intravenous To OR  . pioglitazone  30 mg Oral QAC breakfast  . potassium chloride  80 mEq Other To OR  . sodium chloride  3 mL Intravenous Q12H  . vancomycin  1,500 mg Intravenous To OR  . DISCONTD: chlorhexidine  60 mL Topical Once   Infusions:    . heparin 1,800 Units/hr (11/11/11 2033)   Assessment: 60yo male now therapeutic on heparin; scheduled for first-case OHS.  Goal of Therapy:  Heparin level 0.3-0.7 units/ml   Plan:  Will continue gtt at current rate and f/u after OHS.  Colleen Can PharmD BCPS 11/12/2011,4:36 AM

## 2011-11-12 NOTE — Progress Notes (Signed)
Patient ID: [name], male   DOB: Apr 26, 1951, 60 y.o.   MRN: [id number] \ S/p CABG x 3 Extubated Hemodynamically stable Not bleeding

## 2011-11-12 NOTE — Preoperative (Signed)
Beta Blockers   Reason not to administer Beta Blockers:Not Applicable 

## 2011-11-12 NOTE — Brief Op Note (Signed)
11/10/2011 - 11/12/2011  11:21 AM  PATIENT:  [name] [name]  60 y.o. male  PRE-OPERATIVE DIAGNOSIS:  Coronary Artery Disease  POST-OPERATIVE DIAGNOSIS:  Coronary Artery Disease   PROCEDURE:  Procedure(s): CORONARY ARTERY BYPASS GRAFTING (CABG)x3 (LIMA to LAD,SVG sequentially to Diagonal 1 and Diagonal 3) with EVH Right thigh.  SURGEON:  Delight Ovens, MD  PHYSICIAN ASSISTANT: Doree Fudge PA-C  ANESTHESIA:   general  EBL:  Total I/O In: -  Out: 675 [Urine:675]  COUNTS:  YES  DICTATION: .Office manager and Other Dictation  PLAN OF CARE: Transfer to SICU  PATIENT DISPOSITION:  ICU - intubated and hemodynamically stable.   Delay start of Pharmacological VTE agent (>24hrs) due to surgical blood loss or risk of bleeding

## 2011-11-12 NOTE — Anesthesia Preprocedure Evaluation (Addendum)
Anesthesia Evaluation  Patient identified by MRN, date of birth, ID band Patient awake    Reviewed: Allergy & Precautions, H&P , NPO status , Patient's Chart, lab work & pertinent test results, reviewed documented beta blocker date and time   Airway Mallampati: III TM Distance: >3 FB Neck ROM: full    Dental  (+) Chipped, Missing and Poor Dentition   Pulmonary shortness of breath and with exertion, sleep apnea and Continuous Positive Airway Pressure Ventilation ,          Cardiovascular hypertension, Pt. on medications + angina with exertion + CAD     Neuro/Psych  Neuromuscular disease Negative Psych ROS   GI/Hepatic GERD-  Medicated and Controlled,  Endo/Other  Diabetes mellitus-, Well Controlled, Type 2, Oral Hypoglycemic Agents  Renal/GU   Genitourinary negative   Musculoskeletal negative musculoskeletal ROS (+)   Abdominal   Peds negative pediatric ROS (+)  Hematology negative hematology ROS (+)   Anesthesia Other Findings Front upper right broken ,poor dentition  Reproductive/Obstetrics negative OB ROS                         Anesthesia Physical Anesthesia Plan  ASA: III  Anesthesia Plan: General   Post-op Pain Management:    Induction: Intravenous  Airway Management Planned: Oral ETT  Additional Equipment: Arterial line, CVP and PA Cath  Intra-op Plan:   Post-operative Plan: Post-operative intubation/ventilation  Informed Consent: I have reviewed the patients History and Physical, chart, labs and discussed the procedure including the risks, benefits and alternatives for the proposed anesthesia with the patient or authorized representative who has indicated his/her understanding and acceptance.   Dental advisory given  Plan Discussed with: Anesthesiologist  Anesthesia Plan Comments:         Anesthesia Quick Evaluation

## 2011-11-12 NOTE — Transfer of Care (Signed)
Immediate Anesthesia Transfer of Care Note  Patient: DUNG SALINGER  Procedure(s) Performed:  CORONARY ARTERY BYPASS GRAFTING (CABG) - Coronary Artery Bypass Graft times three on pump utilizing left internal mammary artery and right saphenous vein harvested endoscopically   Patient Location: PACU and SICU  Anesthesia Type: General  Level of Consciousness: sedated  Airway & Oxygen Therapy: Patient remains intubated per anesthesia plan and Patient placed on Ventilator (see vital sign flow sheet for setting)  Post-op Assessment: Report given to PACU RN and Post -op Vital signs reviewed and stable  Post vital signs: Reviewed and stable  Complications: No apparent anesthesia complications

## 2011-11-13 ENCOUNTER — Inpatient Hospital Stay (HOSPITAL_COMMUNITY): Payer: BC Managed Care – PPO

## 2011-11-13 LAB — CBC
HCT: 38.1 % — ABNORMAL LOW (ref 39.0–52.0)
Hemoglobin: 13.1 g/dL (ref 13.0–17.0)
Hemoglobin: 13 g/dL (ref 13.0–17.0)
MCH: 30 pg (ref 26.0–34.0)
MCHC: 34.3 g/dL (ref 30.0–36.0)
MCHC: 34.1 g/dL (ref 30.0–36.0)
RDW: 13.2 % (ref 11.5–15.5)
RDW: 13.5 % (ref 11.5–15.5)
WBC: 18.2 10*3/uL — ABNORMAL HIGH (ref 4.0–10.5)

## 2011-11-13 LAB — CREATININE, SERUM
Creatinine, Ser: 0.92 mg/dL (ref 0.50–1.35)
GFR calc Af Amer: >90 mL/min (ref >90)
GFR calc non Af Amer: 89 mL/min — ABNORMAL LOW (ref >90)

## 2011-11-13 LAB — POCT I-STAT, CHEM 8
BUN: 21 mg/dL (ref 6–23)
Calcium, Ion: 1.21 mmol/L (ref 1.12–1.32)
Creatinine, Ser: 1 mg/dL (ref 0.50–1.35)
Glucose, Bld: 171 mg/dL — ABNORMAL HIGH (ref 70–99)
TCO2: 25 mmol/L (ref 0–100)

## 2011-11-13 LAB — GLUCOSE, CAPILLARY
Glucose-Capillary: 117 mg/dL — ABNORMAL HIGH (ref 70–99)
Glucose-Capillary: 120 mg/dL — ABNORMAL HIGH (ref 70–99)
Glucose-Capillary: 120 mg/dL — ABNORMAL HIGH (ref 70–99)
Glucose-Capillary: 114 mg/dL — ABNORMAL HIGH (ref 70–99)
Glucose-Capillary: 104 mg/dL — ABNORMAL HIGH (ref 70–99)
Glucose-Capillary: 97 mg/dL (ref 70–99)
Glucose-Capillary: 130 mg/dL — ABNORMAL HIGH (ref 70–99)
Glucose-Capillary: 125 mg/dL — ABNORMAL HIGH (ref 70–99)
Glucose-Capillary: 109 mg/dL — ABNORMAL HIGH (ref 70–99)
Glucose-Capillary: 104 mg/dL — ABNORMAL HIGH (ref 70–99)
Glucose-Capillary: 151 mg/dL — ABNORMAL HIGH (ref 70–99)

## 2011-11-13 LAB — BASIC METABOLIC PANEL
BUN: 18 mg/dL (ref 6–23)
Calcium: 8.5 mg/dL (ref 8.4–10.5)
GFR calc Af Amer: >90 mL/min (ref >90)
GFR calc non Af Amer: >90 mL/min (ref >90)
Glucose, Bld: 122 mg/dL — ABNORMAL HIGH (ref 70–99)
Potassium: 4 mEq/L (ref 3.5–5.1)
Sodium: 139 mEq/L (ref 135–145)

## 2011-11-13 LAB — MAGNESIUM: Magnesium: 2.1 mg/dL (ref 1.5–2.5)

## 2011-11-13 MED ORDER — INSULIN ASPART 100 UNIT/ML ~~LOC~~ SOLN
0.0000 [IU] | SUBCUTANEOUS | Status: DC
  Administered 2011-11-13: 2 [IU] via SUBCUTANEOUS
  Administered 2011-11-13: 8 [IU] via SUBCUTANEOUS
  Administered 2011-11-13: 4 [IU] via SUBCUTANEOUS
  Administered 2011-11-14: 8 [IU] via SUBCUTANEOUS
  Administered 2011-11-14: 2 [IU] via SUBCUTANEOUS
  Administered 2011-11-14 (×3): 4 [IU] via SUBCUTANEOUS
  Administered 2011-11-14: 8 [IU] via SUBCUTANEOUS
  Administered 2011-11-15 (×2): 4 [IU] via SUBCUTANEOUS
  Filled 2011-11-13: qty 3

## 2011-11-13 MED ORDER — INSULIN GLARGINE 100 UNIT/ML ~~LOC~~ SOLN
20.0000 [IU] | SUBCUTANEOUS | Status: DC
  Administered 2011-11-13 – 2011-11-14 (×2): 20 [IU] via SUBCUTANEOUS
  Filled 2011-11-13: qty 3

## 2011-11-13 MED ORDER — METOCLOPRAMIDE HCL 10 MG PO TABS
10.0000 mg | ORAL_TABLET | Freq: Three times a day (TID) | ORAL | Status: AC
  Administered 2011-11-13 – 2011-11-15 (×8): 10 mg via ORAL
  Filled 2011-11-13 (×8): qty 1

## 2011-11-13 MED ORDER — CALCIUM CHLORIDE 10 % IV SOLN
1.0000 g | Freq: Once | INTRAVENOUS | Status: AC
  Administered 2011-11-13: 1 g via INTRAVENOUS

## 2011-11-13 MED ORDER — CHLORHEXIDINE GLUCONATE 0.12 % MT SOLN
OROMUCOSAL | Status: AC
  Filled 2011-11-13: qty 15

## 2011-11-13 MED ORDER — FUROSEMIDE 10 MG/ML IJ SOLN
40.0000 mg | Freq: Once | INTRAMUSCULAR | Status: AC
  Administered 2011-11-13: 40 mg via INTRAVENOUS
  Filled 2011-11-13: qty 4

## 2011-11-13 MED ORDER — POTASSIUM CHLORIDE 10 MEQ/50ML IV SOLN
10.0000 meq | INTRAVENOUS | Status: AC
  Administered 2011-11-13 (×3): 10 meq via INTRAVENOUS
  Filled 2011-11-13 (×2): qty 50

## 2011-11-13 MED ORDER — PIOGLITAZONE HCL 30 MG PO TABS
30.0000 mg | ORAL_TABLET | Freq: Every day | ORAL | Status: DC
  Administered 2011-11-13 – 2011-11-14 (×2): 30 mg via ORAL
  Filled 2011-11-13 (×4): qty 1

## 2011-11-13 MED ORDER — ALBUMIN HUMAN 5 % IV SOLN
12.5000 g | Freq: Once | INTRAVENOUS | Status: AC
  Administered 2011-11-13: 12.5 g via INTRAVENOUS

## 2011-11-13 MED ORDER — POTASSIUM CHLORIDE 10 MEQ/50ML IV SOLN
INTRAVENOUS | Status: AC
  Administered 2011-11-13: 10 meq via INTRAVENOUS
  Filled 2011-11-13: qty 50

## 2011-11-13 NOTE — Progress Notes (Signed)
1 Day Post-Op Procedure(s) (LRB): CORONARY ARTERY BYPASS GRAFTING (CABG) (N/A) Subjective: C/o incisional pain  Objective: Vital signs in last 24 hours: Temp:  [96.8 F (36 C)-100.6 F (38.1 C)] 100 F (37.8 C) (12/08 0715) Pulse Rate:  [75-86] 75  (12/08 0715) Cardiac Rhythm:  [-] Atrial paced (12/07 2115) Resp:  [12-27] 24  (12/08 0715) BP: (86-108)/(45-63) 90/48 mmHg (12/08 0700) SpO2:  [93 %-99 %] 96 % (12/08 0715) FiO2 (%):  [39.9 %-70 %] 39.9 % (12/07 1730) Weight:  [104 kg (229 lb 4.5 oz)-109.5 kg (241 lb 6.5 oz)] 241 lb 6.5 oz (109.5 kg) (12/08 0600)  Hemodynamic parameters for last 24 hours: PAP: (25-48)/(13-28) 37/19 mmHg CO:  [5.2 L/min-7.3 L/min] 7 L/min CI:  [2.5 L/min/m2-3.5 L/min/m2] 3.3 L/min/m2  Intake/Output from previous day: 12/07 0701 - 12/08 0700 In: 4098 [I.V.:5464; Blood:695; NG/GT:20; IV Piggyback:450] Out: 5020 [Urine:2855; Emesis/NG output:100; Blood:1805; Chest Tube:260] Intake/Output this shift: Total I/O In: 1.8 [I.V.:1.8] Out: -   General appearance: alert and mild distress Neurologic: intact Heart: regular rate and rhythm Lungs: diminished breath sounds bibasilar Abdomen: normal findings: + BS and abnormal findings:  distended  Lab Results:  Basename 11/13/11 0456 11/12/11 1858 11/12/11 1855  WBC 18.0* -- 16.1*  HGB 13.1 14.6 --  HCT 38.2* 43.0 --  PLT 175 -- 146*   BMET:  Basename 11/13/11 0456 11/12/11 1858 11/12/11 0229  NA 139 141 --  K 4.0 4.0 --  CL 106 108 --  CO2 25 -- 26  GLUCOSE 122* 128* --  BUN 18 20 --  CREATININE 0.90 1.10 --  CALCIUM 8.5 -- 9.2    PT/INR:  Basename 11/12/11 1331  LABPROT 14.9  INR 1.15   ABG    Component Value Date/Time   PHART 7.347* 11/12/2011 1853   HCO3 24.7* 11/12/2011 1853   TCO2 23 11/12/2011 1858   ACIDBASEDEF 1.0 11/12/2011 1853   O2SAT 95.0 11/12/2011 1853   CBG (last 3)   Basename 11/13/11 0250 11/13/11 0201 11/13/11 0054  GLUCAP 97 104* 112*    Assessment/Plan: S/P  Procedure(s) (LRB): CORONARY ARTERY BYPASS GRAFTING (CABG) (N/A) Mobilize Diuresis Diabetes control d/c tubes/lines Transition to lantus, SSI, restart actos reglan- distended, but does have BS, denies nausea   LOS: 3 days    Karleen Seebeck C 11/13/2011

## 2011-11-14 ENCOUNTER — Inpatient Hospital Stay (HOSPITAL_COMMUNITY): Payer: BC Managed Care – PPO

## 2011-11-14 LAB — BASIC METABOLIC PANEL
CO2: 28 mEq/L (ref 19–32)
Calcium: 9.1 mg/dL (ref 8.4–10.5)
Creatinine, Ser: 0.93 mg/dL (ref 0.50–1.35)
Glucose, Bld: 162 mg/dL — ABNORMAL HIGH (ref 70–99)
Sodium: 139 mEq/L (ref 135–145)

## 2011-11-14 LAB — GLUCOSE, CAPILLARY
Glucose-Capillary: 155 mg/dL — ABNORMAL HIGH (ref 70–99)
Glucose-Capillary: 236 mg/dL — ABNORMAL HIGH (ref 70–99)
Glucose-Capillary: 182 mg/dL — ABNORMAL HIGH (ref 70–99)

## 2011-11-14 LAB — CBC
MCH: 30.3 pg (ref 26.0–34.0)
MCV: 89.6 fL (ref 78.0–100.0)
Platelets: 146 10*3/uL — ABNORMAL LOW (ref 150–400)
RBC: 4.12 MIL/uL — ABNORMAL LOW (ref 4.22–5.81)

## 2011-11-14 MED ORDER — POTASSIUM CHLORIDE CRYS ER 20 MEQ PO TBCR
20.0000 meq | EXTENDED_RELEASE_TABLET | Freq: Every day | ORAL | Status: DC
  Administered 2011-11-14: 20 meq via ORAL
  Filled 2011-11-14 (×2): qty 1

## 2011-11-14 MED ORDER — FUROSEMIDE 40 MG PO TABS
40.0000 mg | ORAL_TABLET | Freq: Every day | ORAL | Status: DC
  Administered 2011-11-14: 40 mg via ORAL
  Filled 2011-11-14 (×2): qty 1

## 2011-11-14 MED ORDER — INSULIN GLARGINE 100 UNIT/ML ~~LOC~~ SOLN: 20.0000 [IU] | SUBCUTANEOUS | Status: DC

## 2011-11-14 NOTE — Op Note (Signed)
NAME:  [name] [name], [name] [name] NO.:  [id number]  MEDICAL RECORD NO.:  [id number]  LOCATION:  2306                         FACILITY:  [facility]  PHYSICIAN:  Sheliah Plane, MD    DATE OF BIRTH:  1951/10/25  DATE OF PROCEDURE:  11/12/2011 DATE OF DISCHARGE:                              OPERATIVE REPORT   PREOPERATIVE DIAGNOSIS:  Coronary occlusive disease with new onset angina with critical proximal left anterior descending and diagonal disease.  POSTOPERATIVE DIAGNOSIS:  Coronary occlusive disease with new onset angina with critical proximal left anterior descending and diagonal disease.  SURGICAL PROCEDURE:  Coronary artery bypass grafting x3 with the left internal mammary to the left anterior descending coronary artery, reversed saphenous vein graft sequentially to the first and third diagonals with right thigh and the vein harvesting.  SURGEON:  Sheliah Plane, MD  FIRST ASSISTANT:  Doree Fudge, PA  BRIEF HISTORY:  The patient is a 60 year old male with no previous history of cardiac disease who presented with new onset of angina to Dr. Mariah Milling.  He underwent cardiac catheterizations which revealed severe complex disease of the proximal LAD with involvement of the first and third diagonals.  Consideration for bypass surgery versus angioplasty and stenting was discussed with the patient.  Because of the complexity of the proximal LAD lesion and his known diagnosis of diabetes, it was felt that the best long-term solution would be coronary artery bypass grafting.  This was recommended to the patient who agreed and signed informed consent.  DESCRIPTION OF PROCEDURE:  With Swan-Ganz and arterial line monitors in place, the patient underwent general endotracheal anesthesia without incidence.  Skin of the chest and legs was prepped with Betadine and draped in the usual sterile manner.  Using the [employer] endo vein harvesting system, a segment of vein was  harvested from the right thigh and was of good quality and caliber.  Median sternotomy was performed. The left internal mammary artery was dissected down as a pedicle graft. The distal artery was divided and had good free flow.  Pericardium was opened.  Overall ventricular function appeared preserved.  The patient was systemically heparinized.  The ascending aorta and the right atrium were cannulated.  An aortic root vent cardioplegia needle was introduced into the ascending aorta.  The patient was placed on cardiopulmonary bypass 2.4 L/minute per meter squared.  Sites of anastomosis were selected and dissected out of the epicardium.  The patient's body temperature was cooled to 32 degrees.  Aortic crossclamp was applied and 500 mL of cold blood potassium cardioplegia was administered with diastolic arrest of the heart.  Myocardial septal temperatures were monitored throughout the crossclamp.  Attention was turned first to the high diagonal vessel which was opened and admitted a 1-mm probe.  A longitudinal side-to-side anastomosis was carried out with a running 8-0 Prolene.  The distal extent of the same vein was then carried to the third diagonal which was opened and also admitted a 1-mm probe.  The vessel was approximately 1.2-1.3 mm in size.  Using a running 7-0 Prolene, distal anastomosis was performed.  Additional cold blood cardioplegia was administered down the vein graft and in the aortic root.  Attention was then turned to the  left anterior descending coronary artery, which was opened in the distal third of the vessel.  A 1.1-mm probe passed distally without difficulty.  A 1.5-probe passed proximally.  Using a running 8-0 Prolene, the left internal mammary artery was anastomosed to the left anterior descending coronary artery. With release of the bulldog on the mammary artery, there was rise in myocardial septal temperature.  Bulldog was placed back on the mammary artery and  attention was then turned to the proximal anastomosis.  A single punch aortotomy was performed and the vein graft was anastomosed to the ascending aorta.  Air was evacuated from the graft and the ascending aorta.  An aortic cross-clamp was removed with total crossclamp time of 69 minutes.  The patient required electric defibrillation, turned to a sinus rhythm.  Doppler flow revealed good Doppler signal in the mammary artery.  With sites of anastomosis hemostatic, the patient was then ventilated and weaned from cardiopulmonary bypass without difficulty.  He remained hemodynamically stable.  He was decannulated in the usual fashion.  Protamine sulfate was administered with operative field hemostatic.  Atrial and ventricular pacing wires were applied.  Graft marker was applied.  Total pump time was 94 minutes.  The patient did not require any blood bank blood products during the operative procedure.  His pericardium was loosely reapproximated.  A Blake mediastinal drain was left in the mediastinum and a left pleural tube left in the pleural space.  The sternum was then closed with #6 stainless steel wire.  Fascia was closed with interrupted 0 Vicryl, running 3-0 Vicryl on the subcutaneous tissue, and 4-0 subcuticular stitch in skin edges.  Dry dressings were applied.  Sponge and needle count was reported as correct at the completion of procedure.  The patient tolerated the procedure without obvious complication and was transferred to the Surgical Intensive Care Unit for further postop care.     Sheliah Plane, MD     EG/MEDQ  D:  11/14/2011  T:  11/14/2011  Job:  [id number]

## 2011-11-15 ENCOUNTER — Inpatient Hospital Stay (HOSPITAL_COMMUNITY): Payer: BC Managed Care – PPO

## 2011-11-15 LAB — GLUCOSE, CAPILLARY: Glucose-Capillary: 190 mg/dL — ABNORMAL HIGH (ref 70–99)

## 2011-11-15 LAB — BASIC METABOLIC PANEL
CO2: 26 mEq/L (ref 19–32)
Chloride: 102 mEq/L (ref 96–112)
Sodium: 139 mEq/L (ref 135–145)

## 2011-11-15 LAB — CBC
Hemoglobin: 11.7 g/dL — ABNORMAL LOW (ref 13.0–17.0)
MCV: 89.4 fL (ref 78.0–100.0)
Platelets: 156 10*3/uL (ref 150–400)
RBC: 4.07 MIL/uL — ABNORMAL LOW (ref 4.22–5.81)
WBC: 13 10*3/uL — ABNORMAL HIGH (ref 4.0–10.5)

## 2011-11-15 MED ORDER — ASPIRIN 81 MG PO TABS
81.0000 mg | ORAL_TABLET | Freq: Every day | ORAL | Status: DC
  Administered 2011-11-15 – 2011-11-17 (×3): 81 mg via ORAL
  Filled 2011-11-15 (×3): qty 1

## 2011-11-15 MED ORDER — INSULIN GLARGINE 100 UNIT/ML ~~LOC~~ SOLN
10.0000 [IU] | Freq: Every day | SUBCUTANEOUS | Status: DC
  Administered 2011-11-15 – 2011-11-17 (×3): 10 [IU] via SUBCUTANEOUS
  Filled 2011-11-15: qty 3

## 2011-11-15 MED ORDER — ROSUVASTATIN CALCIUM 10 MG PO TABS
10.0000 mg | ORAL_TABLET | Freq: Every day | ORAL | Status: DC
  Administered 2011-11-15 – 2011-11-16 (×2): 10 mg via ORAL
  Filled 2011-11-15 (×3): qty 1

## 2011-11-15 MED ORDER — POVIDONE-IODINE 10 % EX SOLN
1.0000 "application " | Freq: Two times a day (BID) | CUTANEOUS | Status: DC
  Administered 2011-11-15 – 2011-11-17 (×5): 1 via TOPICAL
  Filled 2011-11-15: qty 15

## 2011-11-15 MED ORDER — ALUM & MAG HYDROXIDE-SIMETH 400-400-40 MG/5ML PO SUSP
15.0000 mL | ORAL | Status: DC | PRN
  Filled 2011-11-15: qty 30

## 2011-11-15 MED ORDER — PANTOPRAZOLE SODIUM 40 MG PO TBEC
40.0000 mg | DELAYED_RELEASE_TABLET | Freq: Every day | ORAL | Status: DC
  Administered 2011-11-16 – 2011-11-17 (×2): 40 mg via ORAL
  Filled 2011-11-15 (×3): qty 1

## 2011-11-15 MED ORDER — PIOGLITAZONE HCL 30 MG PO TABS
30.0000 mg | ORAL_TABLET | Freq: Every day | ORAL | Status: DC
  Administered 2011-11-16 – 2011-11-17 (×2): 30 mg via ORAL
  Filled 2011-11-15 (×3): qty 1

## 2011-11-15 MED ORDER — MOVING RIGHT ALONG BOOK
Freq: Once | Status: AC
  Administered 2011-11-15: 10:00:00
  Filled 2011-11-15: qty 1

## 2011-11-15 MED ORDER — ONDANSETRON HCL 4 MG PO TABS: 4.0000 mg | ORAL_TABLET | Freq: Four times a day (QID) | ORAL | Status: DC | PRN

## 2011-11-15 MED ORDER — BISACODYL 5 MG PO TBEC: 10.0000 mg | DELAYED_RELEASE_TABLET | Freq: Every day | ORAL | Status: DC | PRN

## 2011-11-15 MED ORDER — POTASSIUM CHLORIDE CRYS ER 20 MEQ PO TBCR
20.0000 meq | EXTENDED_RELEASE_TABLET | Freq: Once | ORAL | Status: AC
  Administered 2011-11-15: 20 meq via ORAL

## 2011-11-15 MED ORDER — SODIUM CHLORIDE 0.9 % IJ SOLN: 10.0000 mL | INTRAMUSCULAR | Status: DC | PRN

## 2011-11-15 MED ORDER — METOPROLOL TARTRATE 12.5 MG HALF TABLET
12.5000 mg | ORAL_TABLET | Freq: Two times a day (BID) | ORAL | Status: DC
  Administered 2011-11-15 – 2011-11-17 (×5): 12.5 mg via ORAL
  Filled 2011-11-15 (×6): qty 1

## 2011-11-15 MED ORDER — TRAMADOL HCL 50 MG PO TABS
50.0000 mg | ORAL_TABLET | ORAL | Status: DC | PRN
  Administered 2011-11-15 – 2011-11-17 (×8): 100 mg via ORAL
  Filled 2011-11-15 (×8): qty 2

## 2011-11-15 MED ORDER — SODIUM CHLORIDE 0.9 % IJ SOLN: 3.0000 mL | INTRAMUSCULAR | Status: DC | PRN

## 2011-11-15 MED ORDER — OXYCODONE HCL 5 MG PO TABS
5.0000 mg | ORAL_TABLET | ORAL | Status: DC | PRN
Start: 2011-11-15 — End: 2011-11-17

## 2011-11-15 MED ORDER — PANTOPRAZOLE SODIUM 40 MG PO TBEC
40.0000 mg | DELAYED_RELEASE_TABLET | Freq: Once | ORAL | Status: AC
  Administered 2011-11-15: 40 mg via ORAL
  Filled 2011-11-15: qty 1

## 2011-11-15 MED ORDER — SODIUM CHLORIDE 0.9 % IJ SOLN
3.0000 mL | Freq: Two times a day (BID) | INTRAMUSCULAR | Status: DC
  Administered 2011-11-15 – 2011-11-16 (×4): 3 mL via INTRAVENOUS

## 2011-11-15 MED ORDER — ONDANSETRON HCL 4 MG/2ML IJ SOLN: 4.0000 mg | Freq: Four times a day (QID) | INTRAMUSCULAR | Status: DC | PRN

## 2011-11-15 MED ORDER — PIOGLITAZONE HCL 30 MG PO TABS
30.0000 mg | ORAL_TABLET | Freq: Once | ORAL | Status: AC
  Administered 2011-11-15: 30 mg via ORAL
  Filled 2011-11-15: qty 1

## 2011-11-15 MED ORDER — DOCUSATE SODIUM 100 MG PO CAPS
200.0000 mg | ORAL_CAPSULE | Freq: Every day | ORAL | Status: DC
  Administered 2011-11-15 – 2011-11-16 (×2): 200 mg via ORAL
  Filled 2011-11-15 (×3): qty 2

## 2011-11-15 MED ORDER — ENOXAPARIN SODIUM 40 MG/0.4ML ~~LOC~~ SOLN
40.0000 mg | SUBCUTANEOUS | Status: DC
  Administered 2011-11-15 – 2011-11-16 (×2): 40 mg via SUBCUTANEOUS
  Filled 2011-11-15 (×3): qty 0.4

## 2011-11-15 MED ORDER — METFORMIN HCL 500 MG PO TABS
500.0000 mg | ORAL_TABLET | Freq: Two times a day (BID) | ORAL | Status: DC
  Administered 2011-11-15 – 2011-11-17 (×5): 500 mg via ORAL
  Filled 2011-11-15 (×7): qty 1

## 2011-11-15 MED ORDER — INSULIN ASPART 100 UNIT/ML ~~LOC~~ SOLN
0.0000 [IU] | Freq: Three times a day (TID) | SUBCUTANEOUS | Status: DC
  Administered 2011-11-15 (×2): 2 [IU] via SUBCUTANEOUS
  Administered 2011-11-15: 4 [IU] via SUBCUTANEOUS
  Administered 2011-11-16 – 2011-11-17 (×7): 2 [IU] via SUBCUTANEOUS
  Filled 2011-11-15: qty 3

## 2011-11-15 MED ORDER — SODIUM CHLORIDE 0.9 % IV SOLN: 250.0000 mL | INTRAVENOUS | Status: DC | PRN

## 2011-11-15 MED ORDER — BISACODYL 10 MG RE SUPP
10.0000 mg | Freq: Every day | RECTAL | Status: DC | PRN
  Administered 2011-11-15: 10 mg via RECTAL
  Filled 2011-11-15: qty 1

## 2011-11-15 MED FILL — Dexmedetomidine HCl IV Soln 200 MCG/2ML: INTRAVENOUS | Qty: 2 | Status: AC

## 2011-11-15 MED FILL — Potassium Chloride Inj 2 mEq/ML: INTRAVENOUS | Qty: 40 | Status: AC

## 2011-11-15 MED FILL — Magnesium Sulfate Inj 50%: INTRAMUSCULAR | Qty: 10 | Status: AC

## 2011-11-15 NOTE — Progress Notes (Signed)
Patient ID: [name] [name], male   DOB: 09/17/1951, 60 y.o.   MRN: [id number] [facility] DAILY PROGRESS NOTE                   [address]            [place] [id number]          [phone]      3 Days Post-Op  Procedure(s) (LRB): CORONARY ARTERY BYPASS GRAFTING (CABG) (N/A)  Total Length of Stay:  LOS: 5 days   Subjective: Ambulated around unit no complaints  Objective: Wt Readings from Last 3 Encounters:  11/15/11 240 lb 15.4 oz (109.3 kg)  11/15/11 240 lb 15.4 oz (109.3 kg)  11/04/11 233 lb 8 oz (105.915 kg)   Temp Readings from Last 3 Encounters:  11/15/11 97.5 F (36.4 C) Oral  11/15/11 97.5 F (36.4 C) Oral  11/03/11 97.6 F (36.4 C) Tympanic   BP Readings from Last 3 Encounters:  11/15/11 108/59  11/15/11 108/59  11/10/11 123/74   Pulse Readings from Last 3 Encounters:  11/15/11 73  11/15/11 73  11/10/11 70   Vital signs in last 24 hours: Patient Vitals for the past 24 hrs:  BP Temp Temp src Pulse Resp SpO2 Weight  11/15/11 0742 - 97.5 F (36.4 C) Oral - - - -  11/15/11 0700 108/59 mmHg - - 73  19  91 % -  11/15/11 0600 116/63 mmHg - - 80  19  98 % -  11/15/11 0500 116/63 mmHg - - - 20  97 % 240 lb 15.4 oz (109.3 kg)  11/15/11 0400 - - - - 18  - -  11/15/11 0300 119/67 mmHg 98.6 F (37 C) Oral 79  19  93 % -  11/15/11 0200 104/68 mmHg - - 73  20  92 % -  11/15/11 0100 107/64 mmHg - - 80  20  91 % -  11/15/11 0000 100/86 mmHg - - 80  23  94 % -  11/14/11 2353 - 98.5 F (36.9 C) Oral - - - -  11/14/11 2300 106/69 mmHg - - 79  22  92 % -  11/14/11 2200 131/72 mmHg - - 89  16  93 % -  11/14/11 2100 133/70 mmHg - - 92  22  93 % -  11/14/11 2000 116/61 mmHg - - 84  18  93 % -  11/14/11 1952 - 99.1 F (37.3 C) Oral - - - -  11/14/11 1900 108/56 mmHg - - 84  19  93 % -  11/14/11 1800 119/66 mmHg - - 81  22  93 % -  11/14/11 1700 108/57 mmHg - - 81  21  93 % -  11/14/11 1622 - 98.4 F (36.9 C) Oral - - - -  11/14/11 1600 118/60 mmHg -  - 96  20  93 % -  11/14/11 1500 115/61 mmHg - - 84  20  95 % -  11/14/11 1400 105/60 mmHg - - 87  20  93 % -  11/14/11 1300 - - - - 21  - -  11/14/11 1223 - 99 F (37.2 C) Oral - - - -  11/14/11 1200 131/64 mmHg - - 90  23  95 % -  11/14/11 1100 93/54 mmHg - - 93  22  93 % -  11/14/11 1000 108/59 mmHg - - 90  22  92 % -  11/14/11 0900 124/58 mmHg - -  94  19  95 % -   Wt Readings from Last 3 Encounters:  11/15/11 240 lb 15.4 oz (109.3 kg)  11/15/11 240 lb 15.4 oz (109.3 kg)  11/04/11 233 lb 8 oz (105.915 kg)    Hemodynamic parameters for last 24 hours:    Intake/Output from previous day: 12/09 0701 - 12/10 0700 In: 1300 [P.O.:840; I.V.:460] Out: 1776 [Urine:1775; Stool:1] Intake/Output this shift: Total I/O In: 60 [P.O.:60] Out: -  Current Meds: Scheduled Meds:   . acetaminophen  1,000 mg Oral Q6H   Or  . acetaminophen (TYLENOL) oral liquid 160 mg/5 mL  975 mg Per Tube Q6H  . aspirin EC  325 mg Oral Daily   Or  . aspirin  324 mg Per Tube Daily  . atorvastatin  20 mg Oral QHS  . bisacodyl  10 mg Oral Daily   Or  . bisacodyl  10 mg Rectal Daily  . cefUROXime (ZINACEF)  IV  1.5 g Intravenous Q12H  . docusate sodium  200 mg Oral Daily  . ezetimibe  10 mg Oral Daily  . furosemide  40 mg Oral Daily  . insulin aspart  0-24 Units Subcutaneous Q4H  . insulin glargine  20 Units Subcutaneous Q0700  . metoCLOPramide  10 mg Oral TID AC & HS  . metoprolol tartrate  12.5 mg Oral BID   Or  . metoprolol tartrate  12.5 mg Per Tube BID  . pantoprazole  40 mg Oral Q1200  . pioglitazone  30 mg Oral Daily  . potassium chloride  20 mEq Oral Daily  . sodium chloride  3 mL Intravenous Q12H   Continuous Infusions:   . sodium chloride 20 mL/hr at 11/14/11 0800  . sodium chloride 10 mL/hr at 11/12/11 1345  . sodium chloride    . dexmedetomidine (PRECEDEX) IV infusion Stopped (11/12/11 1715)  . insulin (NOVOLIN-R) infusion    . lactated ringers 20 mL/hr at 11/14/11 0800  .  nitroGLYCERIN    . phenylephrine (NEO-SYNEPHRINE) Adult infusion Stopped (11/13/11 1308)   PRN Meds:.metoprolol, midazolam, morphine, ondansetron (ZOFRAN) IV, oxyCODONE, sodium chloride  General appearance: alert, cooperative and no distress Neurologic: intact Heart: regular rate and rhythm, S1, S2 normal, no murmur, click, rub or gallop and normal apical impulse Lungs: clear to auscultation bilaterally Abdomen: less distended, bowel sounds present, ileus resolving Extremities: extremities normal, atraumatic, no cyanosis or edema and Homans sign is negative, no sign of DVT Wound: sternum stable  Lab Results: CBC: Basename 11/15/11 0500 11/14/11 0427  WBC 13.0* 17.9*  HGB 11.7* 12.5*  HCT 36.4* 36.9*  PLT 156 146*   BMET:  Basename 11/15/11 0500 11/14/11 0427  NA 139 139  K 3.6 4.2  CL 102 104  CO2 26 28  GLUCOSE 169* 162*  BUN 24* 24*  CREATININE 0.80 0.93  CALCIUM 9.2 9.1    PT/INR:  Basename 11/12/11 1331  LABPROT 14.9  INR 1.15   Radiology: Dg Chest Port 1 View  11/15/2011  *RADIOLOGY REPORT*  Clinical Data: Status post CABG.  PORTABLE CHEST - 1 VIEW  Comparison: 11/14/2011  Findings: There are two right jugular central lines present. There are basilar lung densities, left side greater than right.  Heart size is enlarged.  Median sternotomy wires present.  No evidence for a large pneumothorax.  Trachea is midline.  IMPRESSION: Bibasilar densities likely represent a combination atelectasis and small effusions.  Original Report Authenticated By: Richarda Overlie, M.D.   Dg Chest Portable 1 View In Am  11/14/2011  *RADIOLOGY REPORT*  Clinical Data: Cardiac surgery  PORTABLE CHEST - 1 VIEW  Comparison: 11/13/2011  Findings: Stable right internal jugular vein central venous catheter.  Swan-Ganz catheter removed with the introducer left in place.  Cardiomegaly unchanged.  Bibasilar atelectasis unchanged. No pneumothorax.  No definite edema.  Chest tubes removed.  IMPRESSION: Chest  tubes removed without pneumothorax.  Bibasilar atelectasis.  Original Report Authenticated By: Donavan Burnet, M.D.     Assessment/Plan: S/P Procedure(s) (LRB): CORONARY ARTERY BYPASS GRAFTING (CABG) (N/A) Mobilize Diuresis Diabetes control Plan for transfer to step-down: see transfer orders     Delight Ovens MD  Beeper [phone] Office [phone] 11/15/2011 8:09 AM

## 2011-11-15 NOTE — Progress Notes (Signed)
Pt walked about 250 ft with wife.

## 2011-11-15 NOTE — Progress Notes (Signed)
Report called to Alycia Rossetti, RN on 2000.  Pt ambulated to room #2018 accompanied by me.  VSS.  Belongings in care of pt's wife in room.  Care transferred to [facility], [place] in #2018.

## 2011-11-15 NOTE — Progress Notes (Signed)
Inpatient Diabetes Program Recommendations  AACE/ADA: New Consensus Statement on Inpatient Glycemic Control (2009)  Target Ranges:  Prepandial:   less than 140 mg/dL      Peak postprandial:   less than 180 mg/dL (1-2 hours)      Critically ill patients:  140 - 180 mg/dL   Reason for Visit: CBG's better today.  Patient received Lantus 20 units 11/14/11.  Lantus reduced today.  A1C=8.4% indicating sub-optimal glycemic control prior to admit.  Inpatient Diabetes Program Recommendations Insulin - Basal: May need basal insulin at discharge.  Please order insulin teaching if patient to be discharged home on Lantus.  Note:

## 2011-11-16 ENCOUNTER — Inpatient Hospital Stay (HOSPITAL_COMMUNITY): Payer: BC Managed Care – PPO

## 2011-11-16 LAB — CBC
HCT: 34.6 % — ABNORMAL LOW (ref 39.0–52.0)
Hemoglobin: 11.5 g/dL — ABNORMAL LOW (ref 13.0–17.0)
MCH: 29.3 pg (ref 26.0–34.0)
MCHC: 33.2 g/dL (ref 30.0–36.0)
MCV: 88.3 fL (ref 78.0–100.0)
Platelets: 194 10*3/uL (ref 150–400)
RBC: 3.92 MIL/uL — ABNORMAL LOW (ref 4.22–5.81)
RDW: 13.5 % (ref 11.5–15.5)
WBC: 10.5 10*3/uL (ref 4.0–10.5)

## 2011-11-16 LAB — BASIC METABOLIC PANEL
BUN: 22 mg/dL (ref 6–23)
CO2: 27 mEq/L (ref 19–32)
Calcium: 8.9 mg/dL (ref 8.4–10.5)
Chloride: 100 mEq/L (ref 96–112)
Creatinine, Ser: 0.77 mg/dL (ref 0.50–1.35)
GFR calc Af Amer: >90 mL/min (ref >90)
GFR calc non Af Amer: >90 mL/min (ref >90)
Glucose, Bld: 151 mg/dL — ABNORMAL HIGH (ref 70–99)
Potassium: 3.6 mEq/L (ref 3.5–5.1)
Sodium: 137 mEq/L (ref 135–145)

## 2011-11-16 LAB — GLUCOSE, CAPILLARY
Glucose-Capillary: 150 mg/dL — ABNORMAL HIGH (ref 70–99)
Glucose-Capillary: 156 mg/dL — ABNORMAL HIGH (ref 70–99)

## 2011-11-16 MED ORDER — INSULIN PEN STARTER KIT
1.0000 | Freq: Once | Status: AC
  Administered 2011-11-16: 1
  Filled 2011-11-16: qty 1

## 2011-11-16 MED ORDER — OXYCODONE HCL 5 MG PO TABS: 5.0000 mg | ORAL_TABLET | ORAL | Status: AC | PRN

## 2011-11-16 MED ORDER — METOPROLOL TARTRATE 12.5 MG HALF TABLET: 12.5000 mg | ORAL_TABLET | Freq: Two times a day (BID) | ORAL | Status: DC

## 2011-11-16 MED ORDER — LIVING WELL WITH DIABETES BOOK
Freq: Once | Status: AC
  Administered 2011-11-16: 15:00:00
  Filled 2011-11-16: qty 1

## 2011-11-16 MED ORDER — FUROSEMIDE 40 MG PO TABS
40.0000 mg | ORAL_TABLET | Freq: Every day | ORAL | Status: DC
  Administered 2011-11-16 – 2011-11-17 (×2): 40 mg via ORAL
  Filled 2011-11-16 (×2): qty 1

## 2011-11-16 MED ORDER — LACTULOSE 10 GM/15ML PO SOLN: 20.0000 g | Freq: Once | ORAL | Status: DC

## 2011-11-16 MED ORDER — POTASSIUM CHLORIDE CRYS ER 20 MEQ PO TBCR
20.0000 meq | EXTENDED_RELEASE_TABLET | Freq: Every day | ORAL | Status: DC
  Administered 2011-11-16 – 2011-11-17 (×2): 20 meq via ORAL
  Filled 2011-11-16: qty 1

## 2011-11-16 MED ORDER — POTASSIUM CHLORIDE CRYS ER 20 MEQ PO TBCR: 20.0000 meq | EXTENDED_RELEASE_TABLET | Freq: Every day | ORAL | Status: DC

## 2011-11-16 MED ORDER — FUROSEMIDE 40 MG PO TABS: 40.0000 mg | ORAL_TABLET | Freq: Every day | ORAL | Status: DC

## 2011-11-16 MED FILL — Heparin Sodium (Porcine) Inj 1000 Unit/ML: INTRAMUSCULAR | Qty: 90 | Status: AC

## 2011-11-16 MED FILL — Sodium Chloride Irrigation Soln 0.9%: Qty: 3000 | Status: AC

## 2011-11-16 MED FILL — Sodium Chloride IV Soln 0.9%: INTRAVENOUS | Qty: 1000 | Status: AC

## 2011-11-16 MED FILL — Electrolyte-R (PH 7.4) Solution: INTRAVENOUS | Qty: 3000 | Status: AC

## 2011-11-16 NOTE — Progress Notes (Addendum)
4 Days Post-Op Procedure(s) (LRB): CORONARY ARTERY BYPASS GRAFTING (CABG) (N/A)  Subjective: Patient with not much appetite this am. Denies nausea, emesis, or abdominal pain. Passing flatus but no bowel movement yet.  Objective: Vital signs in last 24 hours: Patient Vitals for the past 24 hrs:  BP Temp Temp src Pulse Resp SpO2 Weight  11/16/11 0630 - - - - - - 240 lb 1.3 oz (108.9 kg)  11/16/11 0402 119/67 mmHg 98.7 F (37.1 C) Oral 72  19  91 % 251 lb 12.3 oz (114.2 kg)  11/15/11 2136 - - - 82  - - -  11/15/11 2047 121/57 mmHg 99.5 F (37.5 C) Oral 78  19  95 % -   Pre op weight  105 kg Current Weight  11/16/11 240 lb 1.3 oz (108.9 kg)      Intake/Output from previous day: 12/10 0701 - 12/11 0700 In: 843 [P.O.:840; I.V.:3] Out: 1200 [Urine:1200]   Physical Exam:  Cardiovascular: RRR, no murmurs, gallops, or rubs. Pulmonary: Slightly decreased at bases bilaterally; no rales, wheezes, or rhonchi. Abdomen: Soft, non tender, some distention,bowel sounds present. Extremities: Mild bilateral lower extremity edema. Wounds: Clean and dry.  No erythema or signs of infection.  Lab Results: CBC: Basename 11/16/11 0536 11/15/11 0500  WBC 10.5 13.0*  HGB 11.5* 11.7*  HCT 34.6* 36.4*  PLT 194 156   BMET:  Basename 11/16/11 0536 11/15/11 0500  NA 137 139  K 3.6 3.6  CL 100 102  CO2 27 26  GLUCOSE 151* 169*  BUN 22 24*  CREATININE 0.77 0.80  CALCIUM 8.9 9.2    PT/INR: No results found for this basename: LABPROT,INR in the last 72 hours ABG:  INR: Will add last result for INR, ABG once components are confirmed Will add last 4 CBG results once components are confirmed  Assessment/Plan:  1. CV - SR. Continue Lopressor 12.5 bid. 2.  Pulmonary - Encourage incentive spirometer. 3. Volume Overload - Diurese. 4.  Acute blood loss anemia -H/H stable at 11.5/34.6. 5.DM-CBGs 148/160/143 . Continue with Actos 30, Metformin 500 bid, Insulin PRN. Pre op HGA1C 8.4. Will need  follow up as outpatient. 6.Continue CRPI 7.Lactulose for constipation 8.Possible discharge 1-2 days    ZIMMERMAN,DONIELLE M, PA 11/16/2011   I have seen and examined [name] [name] and agree with the above assessment  and plan. Poss d/c tomorrow   Delight Ovens MD Beeper [phone] Office [phone] 11/16/2011 4:29 PM

## 2011-11-16 NOTE — Progress Notes (Signed)
Patient ambulated 600 ft and tolerated well.  VSS.  Will continue to monitor.

## 2011-11-16 NOTE — Discharge Summary (Addendum)
Physician Discharge Summary  Patient ID: [name] [name] MRN: [id number] DOB/AGE: 61-Jul-1952 61 y.o.  Admit date: 11/10/2011 Discharge date: 11/17/2011  Admission Diagnoses: 1.Multivessel CAD 2.History of hypertension 3.History of hyperlipidemia 4.History of diabetes mellitus 5.History of benign prostatic hypertrophy 6.History of cerebral aneurysm 7.History of obstructive sleep apnea  Discharge Diagnoses:  1.Multivessel CAD 2.History of hypertension 3.History of hyperlipidemia 4.History of diabetes mellitus 5.History of benign prostatic hypertrophy 6.History of cerebral aneurysm 7.History of obstructive sleep apnea   Procedure (s):  1.Cardiac catheterization by Dr. Mariah Milling on 11/10/2011 2.Coronary artery bypass grafting x3  (left  internal mammary to the left anterior descending coronary artery,  reversed saphenous vein graft sequentially to the first and third  Diagonals) with right thigh and the vein harvesting.   History of Presenting Illness: This is a 60 year old Caucasian male who reported that approximately 6 weeks ago he was digging under a trailer for new posts for a deck. He was digging through heavy roots in the ground. He started developing chest and shoulder pain with exertion. Since then, his symptoms have recurred with exertion and are getting worse. He feels it is like a "bear hug" when he gets the chest pain. His symptoms are more intense, lasting longer. 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 He initially presented to his primary care physician Dr. Tillman Abide on 11/03/2011. He was then seen in consultation by Dr. Mariah Milling in the office on 11/04/2011. Unfortunately, he was unable to do a treadmill test secondary to severe knee pain fairly. He reported having adenosine Myoview done in 2009 that apparently showed no ischemia.  EKG done at Dr. Windell Hummingbird office showed normal  sinus rhythm with a heart rate of 78 and T-wave abnormalities in VT through V5 and 1 in aVL. He [facility]/10/2011 in order to undergo cardiac catheterization. Results were as follows: Coronary dominance: Right  Left mainstem: Normal with no significant disease. Bifurcates into the LAD and LCX  Left anterior descending (LAD): Moderate sized vessel with proximal diagonal branch and mid diagonal that is branching. There is proximal to mid diffuse calcification and several regions of tight stenosis estimated at 80%. Also with moderate to severe proximal D1 disease. Disease extends after the takeoff of the D3 (branching vessel). D2 is diffusely diseased and small. There is collaterals from right to left.  Left circumflex (LCx): Moderate to large sized vessel with mild lumenal irregularities.  Right coronary artery (RCA): Moderate to large sized vessel with mild lumenal irregularities.  Left ventriculography: Left ventricular systolic function is normal, LVEF is estimated at 55-65%, there is no significant mitral regurgitation or AS.  Final Conclusions: / Recommendations:  Severe stenosis and calcification of the proximal to mid LAD extending before D1 to beyond D3. Stenosis estimated at 80%. Also with proximal D1 disease. Normal EF 55% with no wall motion ABN. No significant AS/MR.  A cardiothoracic consultation obtained Dr. Tyrone Sage for consideration of coronary bypass grafting surgery. Potential risks, complications, and benefits of the surgery were discussed with the patient and he agreed  to undergo surgery; however, the patient did have preoperative carotid duplex carotid ultrasound which showed no significant internal carotid artery stenosis bilaterally. He then underwent CABG x3 on 11/12/2011.  Brief Hospital Course:  He was extubated earlier the evening of surgery without difficulty. He remained afebrile and hemodynamically stable. His Theone Murdoch, A-line,  chest tubes, and Foley were  all removed early in his postoperative course. He was started on a lowe dose beta blocker. As previously stated he had a history of diabetes mellitus. He was weaned off the insulin drip and restarted on Actos and metformin, as he tolerated his diet his diabetes was somewhat controlled (his glucose after being transferred was 180 or less). He will require follow up with his medical Dr. upon discharge as his hemoglobin A1c preop was 8.4. He was found to be volume overloaded and diuresed accordingly. He was felt surgically stable for transfer from the intensive care unit to [facility] for further convalescence on 11/15/2011. He did have distention of his abdomen; however, he did not have any abdominal pain, nausea, or emesis. He was passing flatus but has not had a bowel movement yet. He has been given a suppository for his constipation. Epicardial pacing wires and chest tube sutures will be removed. Provided he remains afebrile and hemodynamic is stable and was able to move his bowels, he will be surgically stable for discharge on 11/17/2011.  Filed Vitals:   11/16/11 0402  BP: 119/67  Pulse: 72  Temp: 98.7 F (37.1 C)  Resp: 19     Latest Vital Signs: Blood pressure 119/67, pulse 72, temperature 98.7 F (37.1 C), temperature source Oral, resp. rate 19, height 5\' 5"  (1.651 m), weight 240 lb 1.3 oz (108.9 kg), SpO2 91.00%.  Physical Exam: Cardiovascular: RRR, no murmurs, gallops, or rubs.  Pulmonary: Slightly decreased at bases bilaterally; no rales, wheezes, or rhonchi.  Abdomen: Soft, non tender, some distention,bowel sounds present.  Extremities: Mild bilateral lower extremity edema.  Wounds: Clean and dry. No erythema or signs of infection.   Discharge Condition:Stable  Recent laboratory studies:  Lab Results  Component Value Date   WBC 10.5 11/16/2011   HGB 11.5* 11/16/2011   HCT 34.6* 11/16/2011   MCV 88.3 11/16/2011   PLT 194 11/16/2011   Lab Results  Component Value Date   NA 137  11/16/2011   K 3.6 11/16/2011   CL 100 11/16/2011   CO2 27 11/16/2011   CREATININE 0.77 11/16/2011   GLUCOSE 151* 11/16/2011      Diagnostic Studies: Dg Chest 2 View  11/16/2011  *RADIOLOGY REPORT*  Clinical Data: Follow up CABG.  CHEST - 2 VIEW  Comparison: 11/15/2011  Findings: Two views of the chest demonstrate a right jugular central venous catheter.  Evidence for small effusions and left basilar atelectasis.  Heart size remains enlarged.  Epicardial pacer wires are present.  No evidence for a pneumothorax.  IMPRESSION: Small pleural effusions with left basilar atelectasis.  Original Report Authenticated By: Richarda Overlie, M.D.     Discharge Orders    Future Appointments: Provider: Department: Dept Phone: Center:   11/19/2011 11:15 AM Antonieta Iba, MD [facility] [phone] [id number]   11/22/2011 10:15 AM Varney Baas, MD [facility] [phone] [id number]   12/09/2011 12:30 PM Delight Ovens, MD [facility] [phone] [id number]      Discharge Medications: Current Discharge Medication List    START taking these medications   Details  furosemide (LASIX) 40 MG tablet Take 1 tablet (40 mg total) by mouth daily. For 5 days then stop Qty: 5 tablet, Refills: 0    metoprolol tartrate (LOPRESSOR) 12.5 mg TABS Take 0.5 tablets (12.5 mg total) by mouth 2 (two) times daily. Qty: 30 tablet, Refills: 1    oxyCODONE (OXY IR/ROXICODONE) 5 MG immediate release tablet Take 1-2 tablets (5-10 mg  total) by mouth every 4 (four) hours as needed for pain. Qty: 45 tablet, Refills: 0    potassium chloride SA (K-DUR,KLOR-CON) 20 MEQ tablet Take 1 tablet (20 mEq total) by mouth daily. For 5 days then stop. Qty: 5 tablet, Refills: 0      CONTINUE these medications which have NOT CHANGED   Details  aspirin 81 MG tablet Take 2 tablets (162 mg total) by mouth daily.    atorvastatin (LIPITOR) 20 MG tablet Take 20 mg by mouth daily.      metFORMIN (GLUCOPHAGE) 1000 MG tablet  Take 500 mg by mouth 2 (two) times daily with a meal.      pioglitazone (ACTOS) 30 MG tablet Take 30 mg by mouth daily.        STOP taking these medications     HYDROcodone-acetaminophen (VICODIN) 5-500 MG per tablet      nitroGLYCERIN (NITROSTAT) 0.4 MG SL tablet      valsartan-hydrochlorothiazide (DIOVAN-HCT) 160-25 MG per tablet         Follow Up Appointments: Follow-up Information    Follow up with GERHARDT,EDWARD B, MD. (PA/LAT CXR to be taken on 12/09/2011 at 11:45 am;Appointment with Dr. Tyrone Sage is on 12/09/2011 at 12:30 pm)    Contact information:   [address] [place] [address] [phone]       Follow up with [name], MD. (Call for an appointment for 2 weeks)    Contact information:   [address] [place] [address] [phone]       Follow up with Tillman Abide, MD. (Call for follow up appointmet for 2-3 weeks regarding HGA1C 8.4)    Contact information:   [address] [place] [address] [phone]          Signed: Elenore Rota 11/16/2011, 2:24 PM

## 2011-11-16 NOTE — Progress Notes (Signed)
D/C'd patients IJ catheter per MD order and hospital policy.  Pressure held for 5 mins.  Patient tolerated well.  Dressing applied.  Will continue to monitor.

## 2011-11-16 NOTE — Progress Notes (Signed)
   CARE MANAGEMENT NOTE 11/16/2011  Patient:  [name] [name],[name] [name]   Account Number:  [id number]  Date Initiated:  11/16/2011  Documentation initiated by:  BROWN,SARAH  Subjective/Objective Assessment:   Admitted with [facility] - cathed and CABG x3  on 11-12-11.  Lives at home with wife - independent prior to admission.     Action/Plan:   Anticipated DC Date:  11/19/2011   Anticipated DC Plan:  HOME/SELF CARE      DC Planning Services  CM consult      Choice offered to / List presented to:             Status of service:  In process, will continue to follow Medicare Important Message given?   (If response is "NO", the following Medicare IM given date fields will be blank) Date Medicare IM given:   Date Additional Medicare IM given:    Discharge Disposition:    Per UR Regulation:  Reviewed for med. necessity/level of care/duration of stay  Comments:  11/16/11 Zong Mcquarrie,RN,BSN 1203 MET WITH PT TO DISCUSS DC PLANS.  WIFE TO PROVIDE 24HR CARE AT DISCHARGE. DENIES HOME NEEDS; WILL CONT TO FOLLOW. Phone #[phone]   11-16-11 9:50am Sarah Brown, RNBSN - [phone] UR Completed.    

## 2011-11-16 NOTE — Progress Notes (Signed)
Spoke to patient regarding his diabetes at home.  He see's Dr. Alphonsus Sias for his diabetes and states he has mentioned insulin before for his diabetes however the patient did not want to be on insulin.  Explained the results of his A1c and possible need for insulin after discharge.  Also discussed importance of glycemic control for improved healing.  He has glucose meter at home.  Will order insulin starter kit for insulin teaching prior to discharge.  Would benefit also from "outpatient diabetes education".  Will follow.

## 2011-11-16 NOTE — Progress Notes (Signed)
CARDIAC REHAB PHASE I   PRE:  Rate/Rhythm: 77SR  BP:  Supine:   Sitting: 120/79  Standing:    SaO2: 97%RA  MODE:  Ambulation: 550 ft   POST:  Rate/Rhythem: 84SR  BP:  Supine:   Sitting: 133/65  Standing:    SaO2: 96%RA [id number] Pt walked 550 ft with rolling walker with little asst.  Does not need rolling walker.Tolerated well. To chair with call bell.  Duanne Limerick

## 2011-11-16 NOTE — Progress Notes (Signed)
   CARE MANAGEMENT NOTE 11/16/2011  Patient:  DURWIN, DAVISSON   Account Number:  [id number]  Date Initiated:  11/16/2011  Documentation initiated by:  [facility]  Subjective/Objective Assessment:   Admitted with [place] - cathed and CABG x3  on 11-12-11.  Lives at home with wife - independent prior to admission.     Action/Plan:   Anticipated DC Date:  11/19/2011   Anticipated DC Plan:  HOME/SELF CARE      DC Planning Services  CM consult      Choice offered to / List presented to:             Status of service:  In process, will continue to follow Medicare Important Message given?   (If response is "NO", the following Medicare IM given date fields will be blank) Date Medicare IM given:   Date Additional Medicare IM given:    Discharge Disposition:    Per UR Regulation:  Reviewed for med. necessity/level of care/duration of stay  Comments:  11/16/11 Ashlyne Olenick,RN,BSN 1203 MET WITH PT TO DISCUSS DC PLANS.  WIFE TO PROVIDE 24HR CARE AT DISCHARGE. DENIES HOME NEEDS; WILL CONT TO FOLLOW. Phone #[phone]   11-16-11 9:50am Avie Arenas, RNBSN - [phone] UR Completed.

## 2011-11-17 LAB — GLUCOSE, CAPILLARY: Glucose-Capillary: 154 mg/dL — ABNORMAL HIGH (ref 70–99)

## 2011-11-17 NOTE — Progress Notes (Addendum)
5 Days Post-Op Procedure(s) (LRB): CORONARY ARTERY BYPASS GRAFTING (CABG) (N/A)  Subjective: Patient with a bowel movement (loose). Has no other complaints this am.  Objective: Vital signs in last 24 hours: Patient Vitals for the past 24 hrs:  BP Temp Temp src Pulse Resp SpO2 Weight  11/16/11 0630 - - - - - - 240 lb 1.3 oz (108.9 kg)  11/16/11 0402 119/67 mmHg 98.7 F (37.1 C) Oral 72  19  91 % 251 lb 12.3 oz (114.2 kg)  11/15/11 2136 - - - 82  - - -  11/15/11 2047 121/57 mmHg 99.5 F (37.5 C) Oral 78  19  95 % -   Pre op weight  105 kg Current Weight  11/17/11 241 lb 2.9 oz (109.4 kg)      Intake/Output from previous day: 12/11 0701 - 12/12 0700 In: 1083 [P.O.:1080; I.V.:3] Out: 1800 [Urine:1800]   Physical Exam:  Cardiovascular: RRR, no murmurs, gallops, or rubs. Pulmonary: Slightly decreased at bases bilaterally; no rales, wheezes, or rhonchi. Abdomen: Soft, non tender, some distention,bowel sounds present. Extremities: Mild bilateral lower extremity edema. Wounds: Clean and dry.  No erythema or signs of infection.  Lab Results: CBC:  Basename 11/16/11 0536 11/15/11 0500  WBC 10.5 13.0*  HGB 11.5* 11.7*  HCT 34.6* 36.4*  PLT 194 156   BMET:   Basename 11/16/11 0536 11/15/11 0500  NA 137 139  K 3.6 3.6  CL 100 102  CO2 27 26  GLUCOSE 151* 169*  BUN 22 24*  CREATININE 0.77 0.80  CALCIUM 8.9 9.2    PT/INR: No results found for this basename: LABPROT,INR in the last 72 hours ABG:  INR: Will add last result for INR, ABG once components are confirmed Will add last 4 CBG results once components are confirmed  Assessment/Plan:  1. CV - SR. Continue Lopressor 12.5 bid. 2.  Pulmonary - Encourage incentive spirometer. 3. Volume Overload - Diurese. 4.  Acute blood loss anemia -H/H stable at 11.5/34.6. 5.DM-CBGs 156/158/154  . Continue with Actos 30, Metformin 500 bid, Insulin PRN. Pre op HGA1C 8.4. Will need follow up as outpatient. 6.Continue  CRPI 7.Remove EPW and ct sutures 8.Stop colace. 9.Discharge today.    Ardelle Balls, PA 11/17/2011   11/17/2011 7:44 AM

## 2011-11-17 NOTE — Progress Notes (Signed)
Cardiac Rehab Phase I 0830 - 0900  Education done for d/c. Permission for out pt cardiac rehab [place].   Rosalie Doctor

## 2011-11-17 NOTE — Progress Notes (Signed)
Pulled EPW/CTS per MD order and hospital policy.  All ends intact.  Patient tolerated well.  Bedrest for 1 hour.  Will continue to monitor.

## 2011-11-18 ENCOUNTER — Encounter (HOSPITAL_COMMUNITY): Payer: Self-pay | Admitting: Cardiothoracic Surgery

## 2011-11-19 ENCOUNTER — Ambulatory Visit: Payer: BC Managed Care – PPO | Admitting: Cardiovascular Disease

## 2011-11-22 ENCOUNTER — Encounter: Payer: Self-pay | Admitting: *Deleted

## 2011-11-22 ENCOUNTER — Ambulatory Visit: Payer: BC Managed Care – PPO | Admitting: Internal Medicine

## 2011-12-01 NOTE — Telephone Encounter (Signed)
Chart opened in error

## 2011-12-02 ENCOUNTER — Ambulatory Visit (INDEPENDENT_AMBULATORY_CARE_PROVIDER_SITE_OTHER): Payer: BC Managed Care – PPO | Admitting: Internal Medicine

## 2011-12-02 ENCOUNTER — Other Ambulatory Visit: Payer: Self-pay | Admitting: Cardiothoracic Surgery

## 2011-12-02 ENCOUNTER — Encounter: Payer: Self-pay | Admitting: Internal Medicine

## 2011-12-02 DIAGNOSIS — I10 Essential (primary) hypertension: Secondary | ICD-10-CM

## 2011-12-02 DIAGNOSIS — I2510 Atherosclerotic heart disease of native coronary artery without angina pectoris: Secondary | ICD-10-CM

## 2011-12-02 DIAGNOSIS — E119 Type 2 diabetes mellitus without complications: Secondary | ICD-10-CM

## 2011-12-02 DIAGNOSIS — M1990 Unspecified osteoarthritis, unspecified site: Secondary | ICD-10-CM

## 2011-12-02 MED ORDER — HYDROCODONE-ACETAMINOPHEN 5-325 MG PO TABS: 1.0000 | ORAL_TABLET | Freq: Two times a day (BID) | ORAL | Status: DC | PRN

## 2011-12-02 NOTE — Progress Notes (Signed)
Subjective:    Patient ID: [name] [name], male    DOB: 1951/07/06, 60 y.o.   MRN: [id number]  HPI Had cath and significant coronary disease found Stenting not appropriate Had CABG Discharged at day 8 Has follow up with surgeon next week Couldn't take the oxycodone---only using tylenol  Has been healing okay---less soreness now No chest pain Breathing is not back to his baseline Expects to be starting rehab soon  Has been sporadic checking sugars No hypoglycemic spells  Did have one headache since hospital Tylenol did help  Current Outpatient Prescriptions on File Prior to Visit  Medication Sig Dispense Refill  . aspirin 81 MG tablet Take 2 tablets (162 mg total) by mouth daily.      Marland Kitchen atorvastatin (LIPITOR) 20 MG tablet Take 10 mg by mouth daily.       . metFORMIN (GLUCOPHAGE) 1000 MG tablet Take 500 mg by mouth 2 (two) times daily with a meal.        . metoprolol tartrate (LOPRESSOR) 12.5 mg TABS Take 0.5 tablets (12.5 mg total) by mouth 2 (two) times daily.  30 tablet  1  . pioglitazone (ACTOS) 30 MG tablet Take 30 mg by mouth daily.          Allergies  Allergen Reactions  . Doxazosin Mesylate     REACTION: didn't work and may have caused SOB  . Doxycycline     REACTION: unspecified  . Glimepiride     REACTION: red eyes  . Glipizide     REACTION: myalgia??  . Losartan Potassium-Hctz     REACTION: chest \\T \ leg pain  . Rosiglitazone Maleate     REACTION: myalgia  . Simvastatin     REACTION: myalgias    Past Medical History  Diagnosis Date  . BPH (benign prostatic hypertrophy)   . Hx of colonic polyps   . Hyperlipidemia   . Hypertension   . Cerebral aneurysm   . Carpal tunnel syndrome   . Arthritis   . Kidney stones   . Angina   . Shortness of breath   . OSA (obstructive sleep apnea)     uses cpap  . Coronary artery disease   . GERD (gastroesophageal reflux disease)   . Diabetes mellitus     type 2    Past Surgical History  Procedure Date    . Knee surgery     left  . Meniscus repair 03/08    right knee  . Hernia repair   . Coronary artery bypass graft 11/12/2011    Procedure: CORONARY ARTERY BYPASS GRAFTING (CABG);  Surgeon: Delight Ovens, MD;  Location: [facility] OR;  Service: Open Heart Surgery;  Laterality: N/A;  Coronary Artery Bypass Graft times three on pump utilizing left internal mammary artery and right saphenous vein harvested endoscopically     Family History  Problem Relation Age of Onset  . Coronary artery disease Father   . Diabetes Neg Hx   . Hypertension Neg Hx   . Cancer Neg Hx     History   Social History  . Marital Status: Married    Spouse Name: N/A    Number of Children: 3  . Years of Education: N/A   Occupational History  . tool maker    Social History Main Topics  . Smoking status: Never Smoker   . Smokeless tobacco: Never Used  . Alcohol Use: No  . Drug Use: No  . Sexually Active: Yes   Other Topics Concern  .  Not on file   Social History Narrative  . No narrative on file   Review of Systems Appetite is still off Has lost some weight Sleeping okay now---has been using tylenol PM Felt pop in neck yesterday---still with some soreness there     Objective:   Physical Exam  Constitutional: He appears well-developed and well-nourished. No distress.  Neck: Normal range of motion. Neck supple.  Cardiovascular: Normal rate, regular rhythm and normal heart sounds.  Exam reveals no gallop.   No murmur heard. Pulmonary/Chest: Effort normal and breath sounds normal. No respiratory distress. He has no wheezes. He has no rales.       Sternal wound is well healed  Musculoskeletal: He exhibits no edema and no tenderness.  Lymphadenopathy:    He has no cervical adenopathy.  Psychiatric: He has a normal mood and affect. His behavior is normal. Judgment and thought content normal.          Assessment & Plan:

## 2011-12-02 NOTE — Assessment & Plan Note (Signed)
Needs Rx for the hydrocodone for his knees

## 2011-12-02 NOTE — Assessment & Plan Note (Signed)
Lab Results  Component Value Date   HGBA1C 8.4* 11/11/2011   Not optimal Has lost weight Will recheck next time

## 2011-12-02 NOTE — Assessment & Plan Note (Signed)
BP Readings from Last 3 Encounters:  12/02/11 138/70  11/17/11 127/55  11/17/11 127/55   Now on lopressor due to CAD Will plan to restart diovan next time if systolic BP still over 130

## 2011-12-02 NOTE — Assessment & Plan Note (Signed)
Has come through the surgery pretty well Has follow up with surgeon next week Expects to start rehab Now on beta blocker, asa, statin If bp okay next time, will restart the diovan also

## 2011-12-09 ENCOUNTER — Ambulatory Visit (INDEPENDENT_AMBULATORY_CARE_PROVIDER_SITE_OTHER): Payer: Self-pay | Admitting: Cardiothoracic Surgery

## 2011-12-09 ENCOUNTER — Encounter: Payer: Self-pay | Admitting: Cardiothoracic Surgery

## 2011-12-09 ENCOUNTER — Ambulatory Visit
Admission: RE | Admit: 2011-12-09 | Discharge: 2011-12-09 | Disposition: A | Discharge status: Home / Self Care | Payer: BC Managed Care – PPO | Source: Ambulatory Visit | Attending: Cardiothoracic Surgery | Admitting: Cardiothoracic Surgery

## 2011-12-09 VITALS — BP 136/84 | HR 66 | Resp 18 | Ht 65.0 in | Wt 226.0 lb

## 2011-12-09 DIAGNOSIS — I2510 Atherosclerotic heart disease of native coronary artery without angina pectoris: Secondary | ICD-10-CM

## 2011-12-09 DIAGNOSIS — Z951 Presence of aortocoronary bypass graft: Secondary | ICD-10-CM

## 2011-12-09 NOTE — Progress Notes (Signed)
[address]            [place] [address]          [phone]       BYRAN BILOTTI [facility] Medical Record #[id number] Date of Birth: 1950/12/26  Antonieta Iba, MD Tillman Abide, MD, MD  Chief Complaint:   PostOp Follow Up Visit  DATE OF PROCEDURE: 11/12/2011  DATE OF DISCHARGE:  OPERATIVE REPORT  PREOPERATIVE DIAGNOSIS: Coronary occlusive disease with new onset  angina with critical proximal left anterior descending and diagonal  disease.  POSTOPERATIVE DIAGNOSIS: Coronary occlusive disease with new onset  angina with critical proximal left anterior descending and diagonal  disease.  SURGICAL PROCEDURE: Coronary artery bypass grafting x3 with the left  internal mammary to the left anterior descending coronary artery,  reversed saphenous vein graft sequentially to the first and third  diagonals with right thigh and the vein harvesting.   History of Present Illness:      Patient is a 61 year old male who returns following coronary artery bypass grafting x3 because of critical proximal complex LAD lesion nonamenable to and plasty. Postoperatively the patient has been doing well. Gaining strength appropriately and increasing his physical activity. His work does involve significant lifting of 80 to 100 pounds, so he will not male returned to work for several more months. Since surgery he has been making efforts at controlling his diabetes. He's had no evidence of congestive heart failure or recurrent angina.  History  Smoking status  . Never Smoker   Smokeless tobacco  . Never Used       Allergies  Allergen Reactions  . Doxazosin Mesylate     REACTION: didn't work and may have caused SOB  . Doxycycline     REACTION: unspecified  . Glimepiride     REACTION: red eyes  . Glipizide     REACTION: myalgia??  . Losartan Potassium-Hctz     REACTION: chest \\T \ leg pain  . Rosiglitazone Maleate     REACTION: myalgia  .  Simvastatin     REACTION: myalgias    Current Outpatient Prescriptions  Medication Sig Dispense Refill  . aspirin 81 MG tablet Take 2 tablets (162 mg total) by mouth daily.      Marland Kitchen atorvastatin (LIPITOR) 20 MG tablet Take 10 mg by mouth daily.       Marland Kitchen HYDROcodone-acetaminophen (NORCO) 5-325 MG per tablet Take 1 tablet by mouth 2 (two) times daily as needed for pain.  60 tablet  0  . metFORMIN (GLUCOPHAGE) 1000 MG tablet Take 500 mg by mouth 2 (two) times daily with a meal.        . metoprolol tartrate (LOPRESSOR) 12.5 mg TABS Take 0.5 tablets (12.5 mg total) by mouth 2 (two) times daily.  30 tablet  1  . pioglitazone (ACTOS) 30 MG tablet Take 30 mg by mouth daily.             Physical Exam: BP 136/84  Pulse 66  Resp 18  Ht 5\' 5"  (1.651 m)  Wt 226 lb (102.513 kg)  BMI 37.61 kg/m2  SpO2 97%  General appearance: alert, cooperative and no distress Heart: regular rate and rhythm, S1, S2 normal, no murmur, click, rub or gallop Lungs: clear to auscultation bilaterally Extremities: extremities normal, atraumatic, no cyanosis or edema, Homans sign is negative, no sign of DVT and no edema,  redness or tenderness in the calves or thighs Wound: His sternum is stable and well-healed.   Diagnostic Studies & Laboratory data:         Recent Radiology Findings: Dg Chest 2 View  12/09/2011  *RADIOLOGY REPORT*  Clinical Data: Heart surgery in December 2012, shortness of breath, follow-up  CHEST - 2 VIEW  Comparison: Chest x-ray of 11/16/2011  Findings: Small pleural effusions remain left larger than right with mild left basilar atelectasis.  No pneumothorax is seen. Cardiomegaly is stable.  There are degenerative changes in the lower thoracic spine.  IMPRESSION: Small pleural effusions remaining left greater than right with mild left basilar atelectasis.  Original Report Authenticated By: Juline Patch, M.D.      Recent Labs: Lab Results  Component Value Date   WBC 10.5 11/16/2011   HGB 11.5*  11/16/2011   HCT 34.6* 11/16/2011   PLT 194 11/16/2011   GLUCOSE 151* 11/16/2011   CHOL 228* 04/28/2010   TRIG 365.0* 04/28/2010   HDL 39.70 04/28/2010   LDLDIRECT 132.8 04/28/2010   LDLCALC 82 12/22/2006   ALT 25 11/11/2011   AST 30 11/11/2011   NA 137 11/16/2011   K 3.6 11/16/2011   CL 100 11/16/2011   CREATININE 0.77 11/16/2011   BUN 22 11/16/2011   CO2 27 11/16/2011   TSH 1.653 11/14/2011   INR 1.15 11/12/2011   HGBA1C 8.4* 11/11/2011      Assessment / Plan:     Patient is making stable postoperative progress following coronary artery bypass grafting. Prior to surgery he was active involve both working and performing. I've warned him against any heavy lifting for several months. With his work he will not daily return for another 2 months because of the degree of physical lifting involved with his work. I've not made him a return appointment but would be glad to see him at his or Dr. Brynda Peon request.  Delight Ovens MD  12/09/2011 12:58 PM

## 2011-12-09 NOTE — Patient Instructions (Addendum)
[address] NO lifting over 54-09 lbs  for three months Since heavy lifting involved at work wait two more months before going back to work   Coronary Artery Bypass Grafting  Care After  Refer to this sheet in the next few weeks. These instructions provide you with information on caring for yourself after your procedure. Your caregiver may also give you more specific instructions. Your treatment has been planned according to current medical practices, but problems sometimes occur. Call your caregiver if you have any problems or questions after your procedure.  Recovery from open heart surgery will be different for everyone. Some people feel well after 3 or 4 weeks, while for others it takes longer. After heart surgery, it may be normal to:  Not have an appetite, feel nauseated by the smell of food, or only want to eat a small amount.   Be constipated because of changes in your diet, activity, and medicines. Eat foods high in fiber. Add fresh fruits and vegetables to your diet. Stool softeners may be helpful.   Feel sad or unhappy. You may be frustrated or cranky. You may have good days and bad days. Do not give up. Talk to your caregiver if you do not feel better.   Feel weakness and fatigue. You many need physical therapy or cardiac rehabilitation to get your strength back.   Develop an irregular heartbeat called atrial fibrillation. Symptoms of atrial fibrillation are a fast, irregular heartbeat or feelings of fluttery heartbeats, shortness of breath, low blood pressure, and dizziness. If these symptoms develop, see your caregiver right away.  MEDICATION  Have a list of all the medicines you will be taking when you leave the hospital. For every medicine, know the following:   Name.   Exact dose.   Time of day to be taken.   How often it should be taken.   Why you are taking it.   Ask which medicines should or should not be taken together. If you take more than one heart medicine,  ask if it is okay to take them together. Some heart medicines should not be taken at the same time because they may lower your blood pressure too much.   Narcotic pain medicine can cause constipation. Eat fresh fruits and vegetables. Add fiber to your diet. Stool softener medicine may help relieve constipation.   Keep a copy of your medicines with you at all times.   Do not add or stop taking any medicine until you check with your caregiver.   Medicines can have side effects. Call your caregiver who prescribed the medicine if you:   Start throwing up, have diarrhea, or have stomach pain.   Feel dizzy or lightheaded when you stand up.   Feel your heart is skipping beats or is beating too fast or too slow.   Develop a rash.   Notice unusual bruising or bleeding.  HOME CARE INSTRUCTIONS  After heart surgery, it is important to learn how to take your pulse. Have your caregiver show you how to take your pulse.   Use your incentive spirometer. Ask your caregiver how long after surgery you need to use it.  Care of your chest incision  Tell your caregiver right away if you notice clicking in your chest (sternum).   Support your chest with a pillow or your arms when you take deep breaths and cough.   Follow your caregiver's instructions about when you can bathe or swim.   Protect your incision from sunlight  during the first year to keep the scar from getting dark.   Tell your caregiver if you notice:   Increased tenderness of your incision.   Increased redness or swelling around your incision.   Drainage or pus from your incision.  Care of your leg incision(s)  Avoid crossing your legs.   Avoid sitting for long periods of time. Change positions every half hour.   Elevate your leg(s) when you are sitting.   Check your leg(s) daily for swelling. Check the incisions for redness or drainage.   Diet is very important to heart health.   Eat plenty of fresh fruits and vegetables.  Meats should be lean cut. Avoid canned, processed, and fried foods.   Talk to a dietician. They can teach you how to make healthy food and drink choices.  Weight  Weigh yourself every day. This is important because it helps to know if you are retaining fluid that may make your heart and lungs work harder.   Use the same scale each time.   Weigh yourself every morning at the same time. You should do this after you go to the bathroom, but before you eat breakfast.   Your weight will be more accurate if you do not wear any clothes.   Record your weight.   Tell your caregiver if you have gained 2 pounds or more overnight.  Activity Stop any activity at once if you have chest pain, shortness of breath, irregular heartbeats, or dizziness. Get help right away if you have any of these symptoms.  Bathing.  Avoid soaking in a bath or hot tub until your incisions are healed.   Rest. You need a balance of rest and activity.   Exercise. Exercise per your caregiver's advice. You may need physical therapy or cardiac rehabilitation to help strengthen your muscles and build your endurance.   Climbing stairs. Unless your caregiver tells you not to climb stairs, go up stairs slowly and rest if you tire. Do not pull yourself up by the handrail.   Driving a car. Follow your caregiver's advice on when you may drive. You may ride as a passenger at any time. When traveling for long periods of time in a car, get out of the car and walk around for a few minutes every 2 hours.   Lifting. Avoid lifting, pushing, or pulling anything heavier than 10 pounds for 6 weeks after surgery or as told by your caregiver.   Returning to work. Check with your caregiver. People heal at different rates. Most people will be able to go back to work 6 to 12 weeks after surgery.   Sexual activity. You may resume sexual relations as told by your caregiver.  SEEK MEDICAL CARE IF:  Any of your incisions are red, painful, or have  any type of drainage coming from them.   You have an oral temperature above 101.5 F .   You have ankle or leg swelling.   You have pain in your legs.   You have weight gain of 2 or more pounds a day.   You feel dizzy or lightheaded when you stand up.  SEEK IMMEDIATE MEDICAL CARE IF:  You have angina or chest pain that goes to your jaw or arms. Call your local emergency services right away.   You have shortness of breath at rest or with activity.   You have a fast or irregular heartbeat (arrhythmia).   There is a "clicking" in your sternum when you move.  You have numbness or weakness in your arms or legs.  MAKE SURE YOU:  Understand these instructions.   Will watch your condition.   Will get help right away if you are not doing well or get worse.

## 2011-12-13 ENCOUNTER — Encounter: Payer: Self-pay | Admitting: Cardiovascular Disease

## 2011-12-13 ENCOUNTER — Ambulatory Visit (INDEPENDENT_AMBULATORY_CARE_PROVIDER_SITE_OTHER): Payer: BC Managed Care – PPO | Admitting: Cardiovascular Disease

## 2011-12-13 VITALS — BP 138/82 | HR 69 | Ht 65.0 in | Wt 224.8 lb

## 2011-12-13 DIAGNOSIS — I10 Essential (primary) hypertension: Secondary | ICD-10-CM

## 2011-12-13 DIAGNOSIS — E119 Type 2 diabetes mellitus without complications: Secondary | ICD-10-CM

## 2011-12-13 DIAGNOSIS — R0602 Shortness of breath: Secondary | ICD-10-CM | POA: Insufficient documentation

## 2011-12-13 DIAGNOSIS — E785 Hyperlipidemia, unspecified: Secondary | ICD-10-CM

## 2011-12-13 DIAGNOSIS — I2510 Atherosclerotic heart disease of native coronary artery without angina pectoris: Secondary | ICD-10-CM

## 2011-12-13 DIAGNOSIS — I25810 Atherosclerosis of coronary artery bypass graft(s) without angina pectoris: Secondary | ICD-10-CM

## 2011-12-13 MED ORDER — LISINOPRIL 10 MG PO TABS: 10.0000 mg | ORAL_TABLET | Freq: Every day | ORAL | Status: DC

## 2011-12-13 MED ORDER — POTASSIUM CHLORIDE ER 10 MEQ PO TBCR: 20.0000 meq | EXTENDED_RELEASE_TABLET | Freq: Two times a day (BID) | ORAL | Status: DC

## 2011-12-13 MED ORDER — FUROSEMIDE 40 MG PO TABS: 40.0000 mg | ORAL_TABLET | Freq: Every day | ORAL | Status: DC | PRN

## 2011-12-13 NOTE — Patient Instructions (Addendum)
You are doing well. Please start lasix and potassium as need for shortness of breath Start lisinopril 10 mg once a day  Please call us if you have new issues that need to be addressed before your next appt.  Your physician wants you to follow-up in: end of Feb.  You will receive a reminder letter in the mail two months in advance. If you don't receive a letter, please call our office to schedule the follow-up appointment.

## 2011-12-13 NOTE — Assessment & Plan Note (Signed)
He does have an effusion at the left base. We will start with Lasix 40 mg daily p.r.n. With potassium 20 mEq. If he does not have any improvement with Lasix, we may need to reimage the chest x-ray as well as lateral position to see if the effusion layers.

## 2011-12-13 NOTE — Assessment & Plan Note (Signed)
We have encouraged continued exercise, careful diet management in an effort to lose weight. 

## 2011-12-13 NOTE — Progress Notes (Signed)
Patient ID: [name] [name], male    DOB: Aug 27, 1951, 61 y.o.   MRN: [id number]  HPI Comments: 61 year old gentleman with obesity, hyperlipidemia, diabetes, hypertension, patient of Dr. Alphonsus Sias, Who initially presented in November for chest pain. Cardiac catheterization showed severe LAD disease and he had bypass surgery December 7. (Coronary artery bypass grafting x3 with the left internal mammary to the left anterior descending coronary artery, reversed saphenous vein graft sequentially to the first and third diagonals with right thigh and the vein harvesting.) He presents for routine followup.  He reports that his biggest complaint is shortness of breath. He was given Lasix for 5 days following the surgery. Since then, his breathing has been getting worse. He does have a cough. He has difficulty when laying flat and wearing his CPAP. 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 He is tolerating low-dose Lipitor with no significant muscle ache  EKG shows normal sinus rhythm with rate 71 beats per minute with T wave abnormality in V2 through V6, one and aVL   Outpatient Encounter Prescriptions as of 12/13/2011  Medication Sig Dispense Refill  . aspirin 81 MG tablet Take 2 tablets (162 mg total) by mouth daily.      Marland Kitchen atorvastatin (LIPITOR) 20 MG tablet Take 10 mg by mouth daily.       . metFORMIN (GLUCOPHAGE) 1000 MG tablet Take 500 mg by mouth 2 (two) times daily with a meal.        . metoprolol tartrate (LOPRESSOR) 12.5 mg TABS Take 0.5 tablets (12.5 mg total) by mouth 2 (two) times daily.  30 tablet  1  . pioglitazone (ACTOS) 30 MG tablet Take 30 mg by mouth daily.        Marland Kitchen HYDROcodone-acetaminophen (NORCO) 5-325 MG per tablet Take 1 tablet by mouth 2 (two) times daily as needed for pain.  60 tablet  0      Review of Systems  Constitutional: Negative.   HENT: Negative.   Eyes: Negative.   Respiratory: Positive for shortness of breath.   Gastrointestinal:  Negative.   Musculoskeletal: Negative.   Skin: Negative.   Neurological: Negative.   Hematological: Negative.   Psychiatric/Behavioral: Negative.   All other systems reviewed and are negative.    BP 138/82  Pulse 69  Ht 5\' 5"  (1.651 m)  Wt 224 lb 12 oz (101.946 kg)  BMI 37.40 kg/m2  Physical Exam  Nursing note and vitals reviewed. Constitutional: He is oriented to person, place, and time. He appears well-developed and well-nourished.  HENT:  Head: Normocephalic.  Nose: Nose normal.  Mouth/Throat: Oropharynx is clear and moist.  Eyes: Conjunctivae are normal. Pupils are equal, round, and reactive to light.  Neck: Normal range of motion. Neck supple. No JVD present.  Cardiovascular: Normal rate, regular rhythm, S1 normal, S2 normal, normal heart sounds and intact distal pulses.  Exam reveals no gallop and no friction rub.   No murmur heard. Pulmonary/Chest: Effort normal. No respiratory distress. He has decreased breath sounds in the left lower field. He has no wheezes. He has no rales. He exhibits no tenderness.  Abdominal: Soft. Bowel sounds are normal. He exhibits no distension. There is no tenderness.  Musculoskeletal: Normal range of motion. He exhibits no edema and no tenderness.  Lymphadenopathy:    He has no cervical adenopathy.  Neurological: He is alert and oriented to person, place, and time. Coordination normal.  Skin: Skin is warm and dry. No rash noted. No  erythema.  Psychiatric: He has a normal mood and affect. His behavior is normal. Judgment and thought content normal.           Assessment and Plan

## 2011-12-13 NOTE — Assessment & Plan Note (Signed)
S/p CABG x 3. Seems to be doing well though does have shortness of breath. Continue aspirin and Lipitor. Goal LDL less than 70.

## 2011-12-13 NOTE — Assessment & Plan Note (Signed)
Goal LDL less than 70 

## 2011-12-13 NOTE — Assessment & Plan Note (Signed)
Blood pressure borderline elevated. We'll add lisinopril 10 mg daily.

## 2011-12-27 ENCOUNTER — Encounter: Payer: Self-pay | Admitting: Cardiovascular Disease

## 2011-12-27 ENCOUNTER — Telehealth: Payer: Self-pay | Admitting: Cardiovascular Disease

## 2011-12-27 DIAGNOSIS — E785 Hyperlipidemia, unspecified: Secondary | ICD-10-CM

## 2011-12-27 NOTE — Telephone Encounter (Signed)
Patient left voicemail over weekend stating that new medication that he was started on was causing leg cramps.  Would like a call from RN to discuss.

## 2011-12-27 NOTE — Telephone Encounter (Signed)
Attempted to contact pt, LMOM TCB. He was seen 12/13/11, started on Lasix 40 qd PRN, and Lisinopril 5mg  daily.

## 2011-12-29 MED ORDER — ROSUVASTATIN CALCIUM 5 MG PO TABS: 5.0000 mg | ORAL_TABLET | Freq: Every day | ORAL | Status: DC

## 2011-12-29 NOTE — Telephone Encounter (Signed)
Pt.notified

## 2011-12-29 NOTE — Telephone Encounter (Signed)
Correction below, lisinopril 10mg . But I spoke to wife this AM, found out he actually started Lipitor 20mg  after discharge. (s/p CABG x 3 at [facility]) She knows he has had trouble with myalgias with statins in past, but unsure of all the statins he has taken. Allergy list shows cannot tolerate Simva as well. I advised she have pt cut lipitor in half and call me back Fri to see if he can tolerate this dose, otherwise, will see what Dr. Mariah Milling would want to change him to. He has been aggressive with his diet per wife, and wt is down 6 lbs; I explained to wife that is important, but he is going to need the statin for aggressive therapy, she understands.

## 2011-12-29 NOTE — Telephone Encounter (Signed)
Per Dr. Mariah Milling, pt can stop Lipitor and start Crestor 5mg  with lipids in 3 months, if needed after then can incr to 10mg . Attempted to contact pt, LMOM TCB.

## 2012-01-07 ENCOUNTER — Encounter: Payer: Self-pay | Admitting: Cardiovascular Disease

## 2012-01-11 ENCOUNTER — Encounter: Payer: Self-pay | Admitting: Gastroenterology

## 2012-01-14 ENCOUNTER — Telehealth: Payer: Self-pay | Admitting: Internal Medicine

## 2012-01-14 MED ORDER — GLUCOSE BLOOD VI STRP: ORAL_STRIP | Status: DC

## 2012-01-14 NOTE — Telephone Encounter (Signed)
Spoke with patient and advised results rx sent to pharmacy by e-script  

## 2012-01-14 NOTE — Telephone Encounter (Signed)
Addended by: Sueanne Margarita on: 01/14/2012 02:30 PM   Modules accepted: Orders

## 2012-01-14 NOTE — Telephone Encounter (Signed)
Needs to have diabetic strips called into [facility] at [phone]

## 2012-01-31 ENCOUNTER — Ambulatory Visit (INDEPENDENT_AMBULATORY_CARE_PROVIDER_SITE_OTHER): Payer: BC Managed Care – PPO | Admitting: Cardiovascular Disease

## 2012-01-31 ENCOUNTER — Encounter: Payer: Self-pay | Admitting: Cardiovascular Disease

## 2012-01-31 DIAGNOSIS — R0602 Shortness of breath: Secondary | ICD-10-CM

## 2012-01-31 DIAGNOSIS — I10 Essential (primary) hypertension: Secondary | ICD-10-CM

## 2012-01-31 DIAGNOSIS — E785 Hyperlipidemia, unspecified: Secondary | ICD-10-CM

## 2012-01-31 DIAGNOSIS — I2510 Atherosclerotic heart disease of native coronary artery without angina pectoris: Secondary | ICD-10-CM

## 2012-01-31 DIAGNOSIS — E119 Type 2 diabetes mellitus without complications: Secondary | ICD-10-CM

## 2012-01-31 DIAGNOSIS — Z951 Presence of aortocoronary bypass graft: Secondary | ICD-10-CM

## 2012-01-31 MED ORDER — FUROSEMIDE 40 MG PO TABS: 40.0000 mg | ORAL_TABLET | Freq: Two times a day (BID) | ORAL | Status: DC | PRN

## 2012-01-31 MED ORDER — PRAVASTATIN SODIUM 20 MG PO TABS: 20.0000 mg | ORAL_TABLET | Freq: Every evening | ORAL | Status: DC

## 2012-01-31 NOTE — Assessment & Plan Note (Signed)
Etiology of his shortness of breath is uncertain. Uncertain if it is secondary to weight gain, deconditioning since his surgery. He did do a short period of cardiac rehabilitation and is now working back on the farm. Unable to exclude CHF and we have ordered a chest x-ray and echocardiogram to evaluate right ventricular systolic pressures. If RVSP is elevated, we would increase his diuretic and encouraged him to decrease his fluid intake.

## 2012-01-31 NOTE — Progress Notes (Signed)
Patient ID: [name] [name], male    DOB: 1951-07-22, 61 y.o.   MRN: [id number]  HPI Comments: 61 year old gentleman with obesity, hyperlipidemia, diabetes, hypertension, patient of Dr. Alphonsus Sias, Who initially presented in November for chest pain. Cardiac catheterization showed severe LAD disease and he had bypass surgery December 7. (Coronary artery bypass grafting x3 with the left internal mammary to the left anterior descending coronary artery, reversed saphenous vein graft sequentially to the first and third diagonals with right thigh and the vein harvesting.) He presents for routine followup.  He continues to have symptoms of shortness of breath. 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 Shortness of breath comes on at rest while sitting. Interestingly, he reports that with exertion, shortness of breath does not get worse. He denies any PND or orthopnea. No significant lower extremity edema. He feels that the fluid has collected around his right upper quadrant and he feels a fullness in that location. He does drink significant ice tea during the day as he has not been working as much. His disability ran out today though he reports that his shortness of breath is still significant. He has had significant cramping in his legs and knees. He attributes this to be Crestor. Similar problem with Lipitor.  He did go to cardiac rehabilitation for approximately 4 weeks but did not complete all the sessions and is not doing any more. He reports having a recumbent bike at home. Uncertain if he has been using this. He does report getting more activity than he would at cardiac rehabilitation by doing the farm chores.   Previously he did have a cough, and had trouble lying flat with his CPAP. He does not think that Lasix has helped to a significant degree.   EKG shows normal sinus rhythm with rate 71 beats per minute with T wave abnormality in V2  through V6, one and aVL   Outpatient Encounter Prescriptions as of 01/31/2012  Medication Sig Dispense Refill  . aspirin 81 MG tablet Take 2 tablets (162 mg total) by mouth daily.      . furosemide (LASIX) 40 MG tablet Take 1 tablet (40 mg total) by mouth 2 (two) times daily as needed.  180 tablet  3  . glucose blood (FREESTYLE LITE) test strip Use as instructed, patient tests once daily. Dx: 250.00  300 each  3  . lisinopril (PRINIVIL,ZESTRIL) 10 MG tablet Take 1 tablet (10 mg total) by mouth daily.  30 tablet  11  . metFORMIN (GLUCOPHAGE) 1000 MG tablet Take 500 mg by mouth 2 (two) times daily with a meal.      . metoprolol tartrate (LOPRESSOR) 25 MG tablet Take 12.5 mg by mouth 2 (two) times daily.      . pioglitazone (ACTOS) 30 MG tablet Take 30 mg by mouth daily.        . potassium chloride (K-DUR) 10 MEQ tablet Take 2 tablets (20 mEq total) by mouth 2 (two) times daily.  60 tablet  6  . rosuvastatin (CRESTOR) 5 MG tablet Take 1 tablet (5 mg total) by mouth at bedtime.  30 tablet  11      Review of Systems  Constitutional: Negative.   HENT: Negative.   Eyes: Negative.   Respiratory: Positive for shortness of breath.   Gastrointestinal: Negative.   Musculoskeletal: Negative.   Skin: Negative.   Neurological: Negative.   Hematological: Negative.  Psychiatric/Behavioral: Negative.   All other systems reviewed and are negative.    BP 132/80  Pulse 66  Ht 5\' 5"  (1.651 m)  Wt 223 lb 12.8 oz (101.515 kg)  BMI 37.24 kg/m2  Physical Exam  Nursing note and vitals reviewed. Constitutional: He is oriented to person, place, and time. He appears well-developed and well-nourished.       obese  HENT:  Head: Normocephalic.  Nose: Nose normal.  Mouth/Throat: Oropharynx is clear and moist.  Eyes: Conjunctivae are normal. Pupils are equal, round, and reactive to light.  Neck: Normal range of motion. Neck supple. No JVD present.  Cardiovascular: Normal rate, regular rhythm, S1  normal, S2 normal, normal heart sounds and intact distal pulses.  Exam reveals no gallop and no friction rub.   No murmur heard.      Well-healed midline scar in the sternum  Pulmonary/Chest: Effort normal. No respiratory distress. He has decreased breath sounds in the left lower field. He has no wheezes. He has no rales. He exhibits no tenderness.  Abdominal: Soft. Bowel sounds are normal. He exhibits no distension. There is no tenderness.  Musculoskeletal: Normal range of motion. He exhibits no edema and no tenderness.  Lymphadenopathy:    He has no cervical adenopathy.  Neurological: He is alert and oriented to person, place, and time. Coordination normal.  Skin: Skin is warm and dry. No rash noted. No erythema.  Psychiatric: He has a normal mood and affect. His behavior is normal. Judgment and thought content normal.           Assessment and Plan

## 2012-01-31 NOTE — Assessment & Plan Note (Signed)
He is unable to tolerate Crestor secondary to knee pain. We will change to pravastatin 20 mg daily

## 2012-01-31 NOTE — Patient Instructions (Signed)
You are doing well. Hold the crestor, start pravastatin one a day for cholesterol  We have ordered a echocardiogram and CXR to evaluate your shortness of breath  Please call us if you have new issues that need to be addressed before your next appt.  Your physician wants you to follow-up in: 1 months.

## 2012-01-31 NOTE — Assessment & Plan Note (Signed)
Currently with no symptoms of angina. No further workup at this time. Continue current medication regimen. Echo pending. Shortness of breath comes on at rest, not particularly with exertion. Less likely graft failure and ischemia.

## 2012-01-31 NOTE — Assessment & Plan Note (Signed)
Blood pressure is well controlled on today's visit. No changes made to the medications. 

## 2012-01-31 NOTE — Assessment & Plan Note (Signed)
We have encouraged continued exercise, careful diet management in an effort to lose weight. 

## 2012-02-01 ENCOUNTER — Telehealth: Payer: Self-pay | Admitting: *Deleted

## 2012-02-01 NOTE — Telephone Encounter (Signed)
02/01/12--attempted to call pt with results--no answer--answering machine not set up yet--nt

## 2012-02-02 ENCOUNTER — Encounter: Payer: Self-pay | Admitting: Internal Medicine

## 2012-02-02 ENCOUNTER — Ambulatory Visit (INDEPENDENT_AMBULATORY_CARE_PROVIDER_SITE_OTHER): Payer: BC Managed Care – PPO | Admitting: Internal Medicine

## 2012-02-02 ENCOUNTER — Telehealth: Payer: Self-pay | Admitting: *Deleted

## 2012-02-02 VITALS — BP 138/70 | HR 75 | Temp 98.2°F | Ht 65.0 in | Wt 220.0 lb

## 2012-02-02 DIAGNOSIS — E119 Type 2 diabetes mellitus without complications: Secondary | ICD-10-CM

## 2012-02-02 DIAGNOSIS — I2510 Atherosclerotic heart disease of native coronary artery without angina pectoris: Secondary | ICD-10-CM

## 2012-02-02 DIAGNOSIS — I10 Essential (primary) hypertension: Secondary | ICD-10-CM

## 2012-02-02 DIAGNOSIS — E785 Hyperlipidemia, unspecified: Secondary | ICD-10-CM

## 2012-02-02 LAB — BASIC METABOLIC PANEL
GFR: 87.7 mL/min (ref >60.00)
Glucose, Bld: 156 mg/dL — ABNORMAL HIGH (ref 70–99)
Potassium: 3.9 mEq/L (ref 3.5–5.1)
Sodium: 138 mEq/L (ref 135–145)

## 2012-02-02 LAB — HEMOGLOBIN A1C: Hgb A1c MFr Bld: 7.8 % — ABNORMAL HIGH (ref 4.6–6.5)

## 2012-02-02 NOTE — Telephone Encounter (Signed)
Message copied by Antony Odea on Wed Feb 02, 2012  4:13 PM ------      Message from: Phoebe Sharps      Created: Mon Jan 31, 2012 10:41 PM       CXR looks ok.      No pleural effusion      Will wait for echo      If that looks ok, it would suggest heart is doing well after CABG      Would continue walking for conditioning s/p CABG

## 2012-02-02 NOTE — Assessment & Plan Note (Signed)
Muscle pain severe with the crestor May have had cognitive changes also  Hasn't started the pravastatin yet

## 2012-02-02 NOTE — Assessment & Plan Note (Signed)
hopefully better control If LV EF is decreased--would stop the actos and switch to glimepiride or [place]

## 2012-02-02 NOTE — Telephone Encounter (Signed)
msg left and asked to return call if further questions or concerns

## 2012-02-02 NOTE — Assessment & Plan Note (Signed)
Has been recovering well from CABG The dyspnea is puzzling---echo pending Doesn't seem exertional

## 2012-02-02 NOTE — Progress Notes (Signed)
Subjective:    Patient ID: [name] [name], male    DOB: 1951/11/15, 61 y.o.   MRN: [id number]  HPI DOing fairly well Still gets shortness of breath Saw Dr Adline Peals was okay Echo pending --next week  Has been back to work on the farm Not having the chest and shoulder pain now Able to split wood and use chain saw  Uses the hydrocodone still for his knee pain mostly Leg pain again with crestor---changed to pravastatin (but hasn't started this yet)  No edema but does note some hand swelling--not necessarily in AM Sleeps flat in bed No PND Mild dizziness but no syncope  Checks sugars every other day Fasting 110-144 No hypoglycemic  Current Outpatient Prescriptions on File Prior to Visit  Medication Sig Dispense Refill  . aspirin 81 MG tablet Take 2 tablets (162 mg total) by mouth daily.      . furosemide (LASIX) 40 MG tablet Take 1 tablet (40 mg total) by mouth 2 (two) times daily as needed.  180 tablet  3  . glucose blood (FREESTYLE LITE) test strip Use as instructed, patient tests once daily. Dx: 250.00  300 each  3  . lisinopril (PRINIVIL,ZESTRIL) 10 MG tablet Take 1 tablet (10 mg total) by mouth daily.  30 tablet  11  . metFORMIN (GLUCOPHAGE) 1000 MG tablet Take 500 mg by mouth 2 (two) times daily with a meal.      . metoprolol tartrate (LOPRESSOR) 25 MG tablet Take 12.5 mg by mouth 2 (two) times daily.      . pioglitazone (ACTOS) 30 MG tablet Take 30 mg by mouth daily.        . potassium chloride (K-DUR) 10 MEQ tablet Take 2 tablets (20 mEq total) by mouth 2 (two) times daily.  60 tablet  6  . pravastatin (PRAVACHOL) 20 MG tablet Take 1 tablet (20 mg total) by mouth every evening.  30 tablet  11    Allergies  Allergen Reactions  . Doxazosin Mesylate     REACTION: didn't work and may have caused SOB  . Doxycycline     REACTION: unspecified  . Glimepiride     REACTION: red eyes  . Glipizide     REACTION: myalgia??  . Losartan Potassium-Hctz     REACTION:  chest \\T \ leg pain  . Rosiglitazone Maleate     REACTION: myalgia  . Simvastatin     REACTION: myalgias    Past Medical History  Diagnosis Date  . BPH (benign prostatic hypertrophy)   . Hx of colonic polyps   . Hyperlipidemia   . Hypertension   . Cerebral aneurysm   . Carpal tunnel syndrome   . Arthritis   . Kidney stones   . Angina   . Shortness of breath   . OSA (obstructive sleep apnea)     uses cpap  . GERD (gastroesophageal reflux disease)   . Diabetes mellitus     type 2  . Coronary artery disease 11/12/11    s/p CABG x 3    Past Surgical History  Procedure Date  . Knee surgery     left  . Meniscus repair 03/08    right knee  . Hernia repair   . Coronary artery bypass graft 11/12/2011    Procedure: CORONARY ARTERY BYPASS GRAFTING (CABG);  Surgeon: Delight Ovens, MD;  Location: [facility] OR;  Service: Open Heart Surgery;  Laterality: N/A;  Coronary Artery Bypass Graft times three on pump utilizing left internal mammary artery  and right saphenous vein harvested endoscopically     Family History  Problem Relation Age of Onset  . Coronary artery disease Father   . Diabetes Neg Hx   . Hypertension Neg Hx   . Cancer Neg Hx     History   Social History  . Marital Status: Married    Spouse Name: N/A    Number of Children: 3  . Years of Education: N/A   Occupational History  . tool maker    Social History Main Topics  . Smoking status: Never Smoker   . Smokeless tobacco: Never Used  . Alcohol Use: No  . Drug Use: No  . Sexually Active: Yes   Other Topics Concern  . Not on file   Social History Narrative  . No narrative on file   Review of Systems Sleeps okay unless he takes hydrocodone late--activates him Has noticed some memory loss----more noticeable since the CABG but he feels it may have been the crestor Weight is down a few pounds more    Objective:   Physical Exam  Constitutional: He appears well-developed and well-nourished. No distress.    Neck: Normal range of motion. Neck supple. No thyromegaly present.  Cardiovascular: Normal rate, regular rhythm, normal heart sounds and intact distal pulses.  Exam reveals no gallop.   No murmur heard. Pulmonary/Chest: Effort normal and breath sounds normal. No respiratory distress. He has no wheezes. He has no rales.  Musculoskeletal: He exhibits no edema and no tenderness.  Lymphadenopathy:    He has no cervical adenopathy.  Psychiatric: He has a normal mood and affect. His behavior is normal.          Assessment & Plan:

## 2012-02-02 NOTE — Assessment & Plan Note (Signed)
BP Readings from Last 3 Encounters:  02/02/12 138/70  01/31/12 132/80  12/13/11 138/82   Good control Appropriately on beta blocker and ACEI

## 2012-02-04 ENCOUNTER — Encounter: Payer: Self-pay | Admitting: Cardiovascular Disease

## 2012-02-08 ENCOUNTER — Other Ambulatory Visit (INDEPENDENT_AMBULATORY_CARE_PROVIDER_SITE_OTHER): Payer: BC Managed Care – PPO

## 2012-02-08 ENCOUNTER — Other Ambulatory Visit: Payer: Self-pay

## 2012-02-08 DIAGNOSIS — R0609 Other forms of dyspnea: Secondary | ICD-10-CM

## 2012-02-08 DIAGNOSIS — Z951 Presence of aortocoronary bypass graft: Secondary | ICD-10-CM

## 2012-02-08 DIAGNOSIS — R0602 Shortness of breath: Secondary | ICD-10-CM

## 2012-02-11 ENCOUNTER — Telehealth: Payer: Self-pay | Admitting: Cardiovascular Disease

## 2012-02-11 NOTE — Telephone Encounter (Signed)
We are four months out from recent CABG (11/12/11) on [name] Age. What do you think about lifting up to 80 lbs? He reports needing to periodically lift heavy items. Recent cxr and echo look good and do not explain SOB (mainly at rest). He is chopping wood and using chain saw. thx Dossie Arbour

## 2012-02-11 NOTE — Telephone Encounter (Signed)
I spoke with the patient. I explained Dr. Mariah Milling is out of the office today, but will be back on Monday. I will need to forward this message to him for approval. 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 He states Dr. Tyrone Sage had approved him before to resume lifting 25-30 lbs, but this documentation has expired for him and he needs to know now what he can do and if Dr. Mariah Milling feels like he can go back. He has a follow up appointment with Dr. Mariah Milling on 02/28/12. I explained to the patient we should know something for him by Monday.

## 2012-02-11 NOTE — Telephone Encounter (Signed)
N/A.  LMTC. 

## 2012-02-11 NOTE — Telephone Encounter (Signed)
Pt needs to know if he can go back to work or not. Pt needs a note either way.

## 2012-02-14 ENCOUNTER — Other Ambulatory Visit: Payer: Self-pay | Admitting: *Deleted

## 2012-02-14 MED ORDER — HYDROCODONE-ACETAMINOPHEN 5-325 MG PO TABS: 1.0000 | ORAL_TABLET | Freq: Two times a day (BID) | ORAL | Status: AC | PRN

## 2012-02-14 NOTE — Telephone Encounter (Signed)
Told pt to speed along an answer he should call surgeon/dr gerhardt. I see Dr Mariah Milling sent MSG to Dr Tyrone Sage asking about wt lift tolerance. No reply and pt needs answer for work.

## 2012-02-14 NOTE — Telephone Encounter (Signed)
N/A.  LMTC. 

## 2012-02-14 NOTE — Telephone Encounter (Signed)
[name] PATIENT, ok to fill? 

## 2012-02-14 NOTE — Telephone Encounter (Signed)
plz phone in. 

## 2012-02-14 NOTE — Telephone Encounter (Signed)
"  4 months out with no problem with sternum he can lift what he wants" EBGerhart  No restrictions. Thx Dossie Arbour

## 2012-02-15 NOTE — Telephone Encounter (Signed)
N/A.  LMTC. 

## 2012-02-15 NOTE — Telephone Encounter (Signed)
rx called to pharmacy 

## 2012-02-15 NOTE — Telephone Encounter (Signed)
[name] states that [name] [name] came to [facility] to get a letter from Dr Tyrone Sage yesterday regarding going back to work and lifting.  She will have him call back if he still needs to speak to [place].

## 2012-02-28 ENCOUNTER — Other Ambulatory Visit: Payer: Self-pay | Admitting: *Deleted

## 2012-02-28 ENCOUNTER — Encounter: Payer: Self-pay | Admitting: Cardiovascular Disease

## 2012-02-28 ENCOUNTER — Ambulatory Visit (INDEPENDENT_AMBULATORY_CARE_PROVIDER_SITE_OTHER): Payer: BC Managed Care – PPO | Admitting: Cardiovascular Disease

## 2012-02-28 VITALS — BP 132/83 | HR 71 | Ht 65.0 in | Wt 225.0 lb

## 2012-02-28 DIAGNOSIS — I2510 Atherosclerotic heart disease of native coronary artery without angina pectoris: Secondary | ICD-10-CM

## 2012-02-28 DIAGNOSIS — I10 Essential (primary) hypertension: Secondary | ICD-10-CM

## 2012-02-28 DIAGNOSIS — R0602 Shortness of breath: Secondary | ICD-10-CM

## 2012-02-28 DIAGNOSIS — E785 Hyperlipidemia, unspecified: Secondary | ICD-10-CM

## 2012-02-28 MED ORDER — METOPROLOL TARTRATE 25 MG PO TABS: 12.5000 mg | ORAL_TABLET | Freq: Two times a day (BID) | ORAL | Status: DC

## 2012-02-28 MED ORDER — LISINOPRIL 10 MG PO TABS: 10.0000 mg | ORAL_TABLET | Freq: Every day | ORAL | Status: DC

## 2012-02-28 MED ORDER — FUROSEMIDE 40 MG PO TABS: 40.0000 mg | ORAL_TABLET | Freq: Two times a day (BID) | ORAL | Status: DC | PRN

## 2012-02-28 NOTE — Progress Notes (Signed)
Patient ID: [name] [name], male    DOB: Aug 21, 1951, 61 y.o.   MRN: [id number]  HPI Comments: 61 year old gentleman with obesity, hyperlipidemia, diabetes, hypertension, patient of Dr. Alphonsus Sias, Who initially presented in November for chest pain. Cardiac catheterization showed severe LAD disease and he had bypass surgery November 12 2011. (Coronary artery bypass grafting x3 with the left internal mammary to the left anterior descending coronary artery, reversed saphenous vein graft sequentially to the first and third diagonals with right thigh and the vein harvesting.) He presents for routine followup.  Previous symptoms of shortness of breath has been slowly improving. He is now working full-time on his farm and doing heavy manual labor. He continues to take Lasix 40 mg twice a day but does drink significant fluids during the daytime. Weight is essentially unchanged  Echocardiogram done recently for his shortness of breath showed normal function, no significant pulmonary hypertension. Normal right ventricular systolic pressure.  His diet is poor and we discussed the foods that he needs to avoid for diabetes. His wife recently bought a box of biscuits and he does eat occasional doughnuts. He has not been taking pravastatin secondary to chronic leg pain. Holding Crestor did not help his leg pain to go away. He does take Crestor periodically. He works on Anim[employer] and wonders if his legs hurt secondary to working on such a hard surface. Pain is in the muscles, as well as the knees.    Outpatient Encounter Prescriptions as of 02/28/2012  Medication Sig Dispense Refill  . aspirin 81 MG tablet Take 2 tablets (162 mg total) by mouth daily.      . furosemide (LASIX) 40 MG tablet Take 1 tablet (40 mg total) by mouth 2 (two) times daily as needed.  180 tablet  3  . glucose blood (FREESTYLE LITE) test strip Use as instructed, patient tests once daily. Dx: 250.00  300 each  3  . lisinopril (PRINIVIL,ZESTRIL)  10 MG tablet Take 1 tablet (10 mg total) by mouth daily.  90 tablet  3  . metFORMIN (GLUCOPHAGE) 1000 MG tablet Take 500 mg by mouth 2 (two) times daily with a meal.      . metoprolol tartrate (LOPRESSOR) 25 MG tablet Take 0.5 tablets (12.5 mg total) by mouth 2 (two) times daily.  90 tablet  3  . pioglitazone (ACTOS) 30 MG tablet Take 30 mg by mouth daily.        . potassium chloride (K-DUR) 10 MEQ tablet Take 2 tablets (20 mEq total) by mouth 2 (two) times daily.  60 tablet  6  . pravastatin (PRAVACHOL) 20 MG tablet Take 1 tablet (20 mg total) by mouth every evening.  30 tablet  11   Review of Systems  Constitutional: Negative.   HENT: Negative.   Eyes: Negative.   Respiratory: Positive for shortness of breath.   Cardiovascular: Negative.   Gastrointestinal: Negative.   Musculoskeletal: Negative.   Skin: Negative.   Neurological: Negative.   Hematological: Negative.   Psychiatric/Behavioral: Negative.   All other systems reviewed and are negative.   BP 132/83  Pulse 71  Ht 5\' 5"  (1.651 m)  Wt 225 lb (102.059 kg)  BMI 37.44 kg/m2  Physical Exam  Nursing note and vitals reviewed. Constitutional: He is oriented to person, place, and time. He appears well-developed and well-nourished.       obese  HENT:  Head: Normocephalic.  Nose: Nose normal.  Mouth/Throat: Oropharynx is clear and moist.  Eyes: Conjunctivae are normal. Pupils  are equal, round, and reactive to light.  Neck: Normal range of motion. Neck supple. No JVD present.  Cardiovascular: Normal rate, regular rhythm, S1 normal, S2 normal, normal heart sounds and intact distal pulses.  Exam reveals no gallop and no friction rub.   No murmur heard.      Well-healed midline scar in the sternum  Pulmonary/Chest: Effort normal. No respiratory distress. He has no decreased breath sounds. He has no wheezes. He has no rales. He exhibits no tenderness.  Abdominal: Soft. Bowel sounds are normal. He exhibits no distension. There is  no tenderness.  Musculoskeletal: Normal range of motion. He exhibits no edema and no tenderness.  Lymphadenopathy:    He has no cervical adenopathy.  Neurological: He is alert and oriented to person, place, and time. Coordination normal.  Skin: Skin is warm and dry. No rash noted. No erythema.  Psychiatric: He has a normal mood and affect. His behavior is normal. Judgment and thought content normal.           Assessment and Plan        Goal will

## 2012-02-28 NOTE — Assessment & Plan Note (Signed)
Shortness of breath somewhat improved. I suggested he take Lasix when necessary. Symptoms were probably secondary to conditioning. Echo was normal.

## 2012-02-28 NOTE — Patient Instructions (Signed)
You are doing well. Start the cholesterol pill Cut back on the water pill  Please call us if you have new issues that need to be addressed before your next appt.  Your physician wants you to follow-up in: 6 months.  You will receive a reminder letter in the mail two months in advance. If you don't receive a letter, please call our office to schedule the follow-up appointment.

## 2012-02-28 NOTE — Assessment & Plan Note (Signed)
We have encouraged him to either stay on Crestor or start pravastatin. He is currently not taking either and continues to have leg pain. Pain symptoms is likely not secondary to the statin.

## 2012-02-28 NOTE — Assessment & Plan Note (Signed)
Currently with no symptoms of angina. No further workup at this time. Continue current medication regimen. 

## 2012-02-28 NOTE — Assessment & Plan Note (Signed)
Blood pressure is well controlled on today's visit. No changes made to the medications. 

## 2012-04-06 ENCOUNTER — Other Ambulatory Visit: Payer: Self-pay | Admitting: *Deleted

## 2012-04-06 MED ORDER — HYDROCODONE-ACETAMINOPHEN 5-325 MG PO TABS: 1.0000 | ORAL_TABLET | Freq: Two times a day (BID) | ORAL | Status: DC | PRN

## 2012-04-06 NOTE — Telephone Encounter (Signed)
Okay #[phone] x [phone] 

## 2012-04-06 NOTE — Telephone Encounter (Signed)
rx called into pharmacy

## 2012-04-06 NOTE — Telephone Encounter (Signed)
Received faxed refill request from [facility], medication was not on med list.  Please advise.

## 2012-04-27 ENCOUNTER — Other Ambulatory Visit: Payer: Self-pay | Admitting: Cardiovascular Disease

## 2012-04-27 MED ORDER — POTASSIUM CHLORIDE ER 10 MEQ PO TBCR: 20.0000 meq | EXTENDED_RELEASE_TABLET | Freq: Two times a day (BID) | ORAL | Status: DC

## 2012-04-27 NOTE — Telephone Encounter (Signed)
Refilled Potassium-Chloride.

## 2012-05-12 ENCOUNTER — Encounter: Payer: Self-pay | Admitting: Internal Medicine

## 2012-05-12 ENCOUNTER — Ambulatory Visit (INDEPENDENT_AMBULATORY_CARE_PROVIDER_SITE_OTHER): Payer: BC Managed Care – PPO | Admitting: Internal Medicine

## 2012-05-12 ENCOUNTER — Telehealth: Payer: Self-pay

## 2012-05-12 VITALS — BP 118/70 | HR 77 | Temp 98.5°F | Wt 230.0 lb

## 2012-05-12 DIAGNOSIS — L03119 Cellulitis of unspecified part of limb: Secondary | ICD-10-CM | POA: Insufficient documentation

## 2012-05-12 DIAGNOSIS — L02419 Cutaneous abscess of limb, unspecified: Secondary | ICD-10-CM

## 2012-05-12 MED ORDER — CLINDAMYCIN HCL 300 MG PO CAPS: 300.0000 mg | ORAL_CAPSULE | Freq: Three times a day (TID) | ORAL | Status: AC

## 2012-05-12 NOTE — Progress Notes (Signed)
Subjective:    Patient ID: [name] [name], male    DOB: 1951-06-20, 61 y.o.   MRN: [id number]  HPI Was spraying round up around his fence Pulled off 4 ticks Lots of chiggers  Right thigh--just above knee--had 2 itchy spots Then opened up and came together with redness and hard areas  No discharge Tried OTC chigger Rx then cortisone---neither helped  Current Outpatient Prescriptions on File Prior to Visit  Medication Sig Dispense Refill  . aspirin 81 MG tablet Take 2 tablets (162 mg total) by mouth daily.      . furosemide (LASIX) 40 MG tablet Take 1 tablet (40 mg total) by mouth 2 (two) times daily as needed.  180 tablet  3  . glucose blood (FREESTYLE LITE) test strip Use as instructed, patient tests once daily. Dx: 250.00  300 each  3  . HYDROcodone-acetaminophen (NORCO) 5-325 MG per tablet Take 1 tablet by mouth 2 (two) times daily as needed.  60 tablet  0  . lisinopril (PRINIVIL,ZESTRIL) 10 MG tablet Take 1 tablet (10 mg total) by mouth daily.  90 tablet  3  . metFORMIN (GLUCOPHAGE) 1000 MG tablet Take 500 mg by mouth 2 (two) times daily with a meal.      . metoprolol tartrate (LOPRESSOR) 25 MG tablet Take 0.5 tablets (12.5 mg total) by mouth 2 (two) times daily.  90 tablet  3  . pioglitazone (ACTOS) 30 MG tablet Take 30 mg by mouth daily.        . potassium chloride (K-DUR) 10 MEQ tablet Take 2 tablets (20 mEq total) by mouth 2 (two) times daily.  60 tablet  6  . pravastatin (PRAVACHOL) 20 MG tablet Take 1 tablet (20 mg total) by mouth every evening.  30 tablet  11  . DISCONTD: furosemide (LASIX) 40 MG tablet Take 1 tablet (40 mg total) by mouth daily. For 5 days then stop  5 tablet  0    Allergies  Allergen Reactions  . Doxazosin Mesylate     REACTION: didn't work and may have caused SOB  . Doxycycline     REACTION: unspecified  . Glimepiride     REACTION: red eyes  . Glipizide     REACTION: myalgia??  . Losartan Potassium-Hctz     REACTION: chest \\T \ leg pain  .  Rosiglitazone Maleate     REACTION: myalgia  . Simvastatin     REACTION: myalgias    Past Medical History  Diagnosis Date  . BPH (benign prostatic hypertrophy)   . Hx of colonic polyps   . Hyperlipidemia   . Hypertension   . Cerebral aneurysm   . Carpal tunnel syndrome   . Arthritis   . Kidney stones   . Angina   . Shortness of breath   . OSA (obstructive sleep apnea)     uses cpap  . GERD (gastroesophageal reflux disease)   . Diabetes mellitus     type 2  . Coronary artery disease 11/12/11    s/p CABG x 3    Past Surgical History  Procedure Date  . Knee surgery     left  . Meniscus repair 03/08    right knee  . Hernia repair   . Coronary artery bypass graft 11/12/2011    Procedure: CORONARY ARTERY BYPASS GRAFTING (CABG);  Surgeon: Delight Ovens, MD;  Location: [facility] OR;  Service: Open Heart Surgery;  Laterality: N/A;  Coronary Artery Bypass Graft times three on pump utilizing left internal mammary  artery and right saphenous vein harvested endoscopically     Family History  Problem Relation Age of Onset  . Coronary artery disease Father   . Diabetes Neg Hx   . Hypertension Neg Hx   . Cancer Neg Hx     History   Social History  . Marital Status: Married    Spouse Name: N/A    Number of Children: 3  . Years of Education: N/A   Occupational History  . tool maker    Social History Main Topics  . Smoking status: Never Smoker   . Smokeless tobacco: Never Used  . Alcohol Use: No  . Drug Use: No  . Sexually Active: Yes   Other Topics Concern  . Not on file   Social History Narrative  . No narrative on file   Review of Systems No fever Doesn't feel right though---"dragging"     Objective:   Physical Exam  Constitutional: He appears well-developed and well-nourished. No distress.  Skin:       Area of erythema and warmth on anterior distal right thigh 2 small open areas without true ulceration or induration in the middle          Assessment &  Plan:

## 2012-05-12 NOTE — Assessment & Plan Note (Signed)
Appearance not typical for MRSA but certainly possible Will treat with clindamycin

## 2012-05-12 NOTE — Telephone Encounter (Signed)
1 week ago 2 bites to rt leg above knee, now red,warm to touch and swollen area size of palm of hand.No red streaks,pain, fever,N&V or difficulty breathing. Pt request appt; to see Dr Alphonsus Sias today at 3:30pm per Jamesetta So.

## 2012-05-22 ENCOUNTER — Other Ambulatory Visit: Payer: Self-pay | Admitting: *Deleted

## 2012-05-22 NOTE — Telephone Encounter (Signed)
Okay #[phone] x [phone] 

## 2012-05-23 MED ORDER — HYDROCODONE-ACETAMINOPHEN 5-325 MG PO TABS: 1.0000 | ORAL_TABLET | Freq: Two times a day (BID) | ORAL | Status: DC | PRN

## 2012-05-23 NOTE — Telephone Encounter (Signed)
rx called into pharmacy

## 2012-06-06 ENCOUNTER — Telehealth: Payer: Self-pay

## 2012-06-06 ENCOUNTER — Other Ambulatory Visit: Payer: Self-pay

## 2012-06-06 MED ORDER — POTASSIUM CHLORIDE CRYS ER 20 MEQ PO TBCR: 20.0000 meq | EXTENDED_RELEASE_TABLET | Freq: Two times a day (BID) | ORAL | Status: DC

## 2012-06-06 NOTE — Telephone Encounter (Signed)
The patient is having anger & memory loss that comes and goes. He read that the metoprolol causes those symptoms and would like to know if can change the metoprolol to another medication. Please advise what to do.

## 2012-06-06 NOTE — Telephone Encounter (Signed)
Refill sent for potassium chloride 20 meq take one tablet bid.

## 2012-06-07 NOTE — Telephone Encounter (Signed)
Don't think it is the metoprolol but he is welcome to try a different medication We could try Coreg 6.25 mg twice a day

## 2012-06-09 ENCOUNTER — Other Ambulatory Visit: Payer: Self-pay

## 2012-06-09 MED ORDER — CARVEDILOL 6.25 MG PO TABS: 6.2500 mg | ORAL_TABLET | Freq: Two times a day (BID) | ORAL | Status: DC

## 2012-06-09 NOTE — Telephone Encounter (Signed)
Pt informed.  Will send in new RX for 1 month to see how he tolerates.  He will stop metoprolol and start coreg as ordered.

## 2012-06-09 NOTE — Telephone Encounter (Signed)
Pt asleep Will try back after lunch

## 2012-06-13 ENCOUNTER — Other Ambulatory Visit: Payer: Self-pay | Admitting: Internal Medicine

## 2012-06-30 ENCOUNTER — Ambulatory Visit (INDEPENDENT_AMBULATORY_CARE_PROVIDER_SITE_OTHER): Payer: BC Managed Care – PPO | Admitting: Family Medicine

## 2012-06-30 ENCOUNTER — Encounter: Payer: Self-pay | Admitting: Family Medicine

## 2012-06-30 VITALS — BP 120/70 | HR 74 | Temp 98.8°F | Wt 232.0 lb

## 2012-06-30 DIAGNOSIS — J069 Acute upper respiratory infection, unspecified: Secondary | ICD-10-CM

## 2012-06-30 DIAGNOSIS — J029 Acute pharyngitis, unspecified: Secondary | ICD-10-CM

## 2012-06-30 MED ORDER — BENZONATATE 200 MG PO CAPS: 200.0000 mg | ORAL_CAPSULE | Freq: Three times a day (TID) | ORAL | Status: AC | PRN

## 2012-06-30 NOTE — Progress Notes (Signed)
"  I think I got a sinus infection."  No help with OTC meds.  4 days duration.  rhinorrhea:yes congestion:yes ear pain:yes sore throat:yes Cough: yes, worse at night, dry Myalgias: some, at or near baseline No fevers.  Eye watering ST, rhinorrhea and ear pain are most bothersome to patient Voice change noted.    ROS: See HPI.  Otherwise negative.    Meds, vitals, and allergies reviewed.   GEN: nad, alert and oriented HEENT: mucous membranes moist, TM w/o erythema, nasal epithelium injected, OP with cobblestoning, poor TM movement on valsalva NECK: supple w/o LA CV: rrr. PULM: ctab, no inc wob ABD: soft, +bs EXT: no edema

## 2012-06-30 NOTE — Patient Instructions (Addendum)
Keep using the over the counter cold medicine and take tessalon for cough.  Drink plenty of fluids and gargle with warm salt water for your throat.  This should gradually improve.  Take care.  Let us know if you have other concerns.

## 2012-07-02 DIAGNOSIS — J069 Acute upper respiratory infection, unspecified: Secondary | ICD-10-CM | POA: Insufficient documentation

## 2012-07-02 NOTE — Assessment & Plan Note (Signed)
Nontoxic, likely viral, no indication for ABX. RST neg.  Supportive tx, f/u prn.  He agrees.

## 2012-07-07 ENCOUNTER — Other Ambulatory Visit: Payer: Self-pay | Admitting: *Deleted

## 2012-07-07 MED ORDER — HYDROCODONE-ACETAMINOPHEN 5-325 MG PO TABS: 1.0000 | ORAL_TABLET | Freq: Two times a day (BID) | ORAL | Status: DC | PRN

## 2012-07-07 NOTE — Telephone Encounter (Signed)
Medication phoned to pharmacy.  

## 2012-07-07 NOTE — Telephone Encounter (Signed)
OK to refill? Last filled 05/22/12.

## 2012-07-07 NOTE — Telephone Encounter (Signed)
Okay #[phone] x [phone] 

## 2012-07-13 ENCOUNTER — Other Ambulatory Visit: Payer: Self-pay | Admitting: *Deleted

## 2012-07-13 MED ORDER — CARVEDILOL 6.25 MG PO TABS: 6.2500 mg | ORAL_TABLET | Freq: Two times a day (BID) | ORAL | Status: DC

## 2012-07-13 NOTE — Telephone Encounter (Signed)
Refilled Carvedilol. 

## 2012-07-18 ENCOUNTER — Other Ambulatory Visit: Payer: Self-pay | Admitting: Cardiovascular Disease

## 2012-07-18 MED ORDER — POTASSIUM CHLORIDE CRYS ER 20 MEQ PO TBCR: 20.0000 meq | EXTENDED_RELEASE_TABLET | Freq: Two times a day (BID) | ORAL | Status: DC

## 2012-07-18 NOTE — Telephone Encounter (Signed)
Pt needs 90 day on 30 sent in

## 2012-07-18 NOTE — Telephone Encounter (Signed)
Refilled 90 day potassium.

## 2012-07-26 ENCOUNTER — Ambulatory Visit (INDEPENDENT_AMBULATORY_CARE_PROVIDER_SITE_OTHER): Payer: BC Managed Care – PPO | Admitting: Internal Medicine

## 2012-07-26 ENCOUNTER — Encounter: Payer: Self-pay | Admitting: Internal Medicine

## 2012-07-26 VITALS — BP 110/60 | HR 75 | Temp 97.9°F | Ht 65.0 in | Wt 231.0 lb

## 2012-07-26 DIAGNOSIS — I2510 Atherosclerotic heart disease of native coronary artery without angina pectoris: Secondary | ICD-10-CM

## 2012-07-26 DIAGNOSIS — M791 Myalgia, unspecified site: Secondary | ICD-10-CM

## 2012-07-26 DIAGNOSIS — E119 Type 2 diabetes mellitus without complications: Secondary | ICD-10-CM

## 2012-07-26 DIAGNOSIS — E785 Hyperlipidemia, unspecified: Secondary | ICD-10-CM

## 2012-07-26 DIAGNOSIS — IMO0001 Reserved for inherently not codable concepts without codable children: Secondary | ICD-10-CM

## 2012-07-26 DIAGNOSIS — I10 Essential (primary) hypertension: Secondary | ICD-10-CM

## 2012-07-26 LAB — CBC WITH DIFFERENTIAL/PLATELET
Basophils Relative: 1.2 % (ref 0.0–3.0)
Eosinophils Absolute: 0.2 10*3/uL (ref 0.0–0.7)
Eosinophils Relative: 3.6 % (ref 0.0–5.0)
HCT: 44.9 % (ref 39.0–52.0)
Hemoglobin: 14.8 g/dL (ref 13.0–17.0)
Lymphs Abs: 1.9 10*3/uL (ref 0.7–4.0)
MCHC: 33 g/dL (ref 30.0–36.0)
MCV: 89.6 fl (ref 78.0–100.0)
Monocytes Absolute: 0.7 10*3/uL (ref 0.1–1.0)
Neutro Abs: 3.2 10*3/uL (ref 1.4–7.7)
RBC: 5.01 Mil/uL (ref 4.22–5.81)

## 2012-07-26 LAB — HEPATIC FUNCTION PANEL
ALT: 17 U/L (ref 0–53)
AST: 17 U/L (ref 0–37)
Total Protein: 7.4 g/dL (ref 6.0–8.3)

## 2012-07-26 LAB — LDL CHOLESTEROL, DIRECT: Direct LDL: 110.6 mg/dL

## 2012-07-26 LAB — BASIC METABOLIC PANEL
CO2: 31 mEq/L (ref 19–32)
Chloride: 96 mEq/L (ref 96–112)
Creatinine, Ser: 1.2 mg/dL (ref 0.4–1.5)
Potassium: 3.9 mEq/L (ref 3.5–5.1)
Sodium: 136 mEq/L (ref 135–145)

## 2012-07-26 LAB — LIPID PANEL
Cholesterol: 180 mg/dL (ref 0–200)
Total CHOL/HDL Ratio: 4

## 2012-07-26 LAB — CK: Total CK: 111 U/L (ref 7–232)

## 2012-07-26 NOTE — Assessment & Plan Note (Signed)
BP Readings from Last 3 Encounters:  07/26/12 110/60  06/30/12 120/70  05/12/12 118/70   Good control even off the carvedilol While beta blocker is a good idea--will keep him off it for now and consider restarting at low dose if feels better off the statin

## 2012-07-26 NOTE — Progress Notes (Signed)
Subjective:    Patient ID: [name] [name], male    DOB: June 14, 1951, 61 y.o.   MRN: [id number]  HPI "I feel bad" Fatigue, legs hurt, pressure around head, joints hurt Abnormal salt taste to food  Gets wore out easy also Easier dyspnea Has been alternating crestor and pravastatin Wonders about Lyme disease after tick bite a few months ago Stopped the carvedilol since he feared that was the problem also--- about 1 week ago  Checks sugars only occ Just got meter straightened out Random usually under 150 No sig hypoglycemic reactions  No chest pain but has "glitch" in chest at times---like something in chest wall No palpitations No edema  Current Outpatient Prescriptions on File Prior to Visit  Medication Sig Dispense Refill  . aspirin 81 MG tablet Take 2 tablets (162 mg total) by mouth daily.      . furosemide (LASIX) 40 MG tablet Take 1 tablet (40 mg total) by mouth 2 (two) times daily as needed.  180 tablet  3  . glucose blood (FREESTYLE LITE) test strip Use as instructed, patient tests once daily. Dx: 250.00  300 each  3  . HYDROcodone-acetaminophen (NORCO/VICODIN) 5-325 MG per tablet Take 1 tablet by mouth 2 (two) times daily as needed.  60 tablet  0  . lisinopril (PRINIVIL,ZESTRIL) 10 MG tablet Take 1 tablet (10 mg total) by mouth daily.  90 tablet  3  . metFORMIN (GLUCOPHAGE) 1000 MG tablet Take 500 mg by mouth 2 (two) times daily with a meal.      . pioglitazone (ACTOS) 30 MG tablet TAKE 1 TABLET DAILY  90 tablet  0  . potassium chloride SA (K-DUR,KLOR-CON) 20 MEQ tablet Take 1 tablet (20 mEq total) by mouth 2 (two) times daily.  180 tablet  3  . DISCONTD: furosemide (LASIX) 40 MG tablet Take 1 tablet (40 mg total) by mouth daily. For 5 days then stop  5 tablet  0  . DISCONTD: potassium chloride SA (K-DUR,KLOR-CON) 20 MEQ tablet Take 1 tablet (20 mEq total) by mouth daily. For 5 days then stop.  5 tablet  0    Allergies  Allergen Reactions  . Doxazosin Mesylate    REACTION: didn't work and may have caused SOB  . Doxycycline     REACTION: unspecified  . Glimepiride     REACTION: red eyes  . Glipizide     REACTION: myalgia??  . Losartan Potassium-Hctz     REACTION: chest \\T \ leg pain  . Rosiglitazone Maleate     REACTION: myalgia  . Simvastatin     REACTION: myalgias    Past Medical History  Diagnosis Date  . BPH (benign prostatic hypertrophy)   . Hx of colonic polyps   . Hyperlipidemia   . Hypertension   . Cerebral aneurysm   . Carpal tunnel syndrome   . Arthritis   . Kidney stones   . Angina   . Shortness of breath   . OSA (obstructive sleep apnea)     uses cpap  . GERD (gastroesophageal reflux disease)   . Diabetes mellitus     type 2  . Coronary artery disease 11/12/11    s/p CABG x 3    Past Surgical History  Procedure Date  . Knee surgery     left  . Meniscus repair 03/08    right knee  . Hernia repair   . Coronary artery bypass graft 11/12/2011    Procedure: CORONARY ARTERY BYPASS GRAFTING (CABG);  Surgeon: Ramon Dredge  Bari Edward, MD;  Location: [facility] OR;  Service: Open Heart Surgery;  Laterality: N/A;  Coronary Artery Bypass Graft times three on pump utilizing left internal mammary artery and right saphenous vein harvested endoscopically     Family History  Problem Relation Age of Onset  . Coronary artery disease Father   . Diabetes Neg Hx   . Hypertension Neg Hx   . Cancer Neg Hx     History   Social History  . Marital Status: Married    Spouse Name: N/A    Number of Children: 3  . Years of Education: N/A   Occupational History  . tool maker    Social History Main Topics  . Smoking status: Never Smoker   . Smokeless tobacco: Never Used  . Alcohol Use: No  . Drug Use: No  . Sexually Active: Yes   Other Topics Concern  . Not on file   Social History Narrative  . No narrative on file   Review of Systems Appetite is off lately Weight is stable Sleeping okay    Objective:   Physical Exam    Constitutional: He appears well-developed and well-nourished. No distress.  Neck: Normal range of motion. Neck supple. No thyromegaly present.  Cardiovascular: Normal rate, regular rhythm, normal heart sounds and intact distal pulses.  Exam reveals no gallop.   No murmur heard. Pulmonary/Chest: Effort normal and breath sounds normal. No respiratory distress. He has no wheezes. He has no rales.  Abdominal: Soft. There is no tenderness.  Musculoskeletal: He exhibits no edema and no tenderness.       No active synovitis  Lymphadenopathy:    He has no cervical adenopathy.  Psychiatric: He has a normal mood and affect. His behavior is normal.          Assessment & Plan:

## 2012-07-26 NOTE — Assessment & Plan Note (Signed)
Seems to have reasonable control Will recheck labs

## 2012-07-26 NOTE — Patient Instructions (Signed)
Please stop the cholesterol meds to see if that makes you feel better. If you are not better, please call for a reevaluation.

## 2012-07-26 NOTE — Assessment & Plan Note (Signed)
Feels bad but doesn't seem to be CAD related

## 2012-07-26 NOTE — Assessment & Plan Note (Signed)
Most likely related to statin again Has generalized fatigue, etc also Will check labs Concern about Lyme disease but no arthritis or neurologic symptoms Will check titer just in case but only consider Rx if still feels bad even off the statin

## 2012-07-27 LAB — LYME, TOTAL AB TEST/REFLEX: Lyme Ab: <0.91 index (ref 0.00–0.90)

## 2012-07-31 ENCOUNTER — Encounter: Payer: Self-pay | Admitting: *Deleted

## 2012-08-16 ENCOUNTER — Other Ambulatory Visit: Payer: Self-pay | Admitting: *Deleted

## 2012-08-16 MED ORDER — HYDROCODONE-ACETAMINOPHEN 5-325 MG PO TABS: 1.0000 | ORAL_TABLET | Freq: Two times a day (BID) | ORAL | Status: DC | PRN

## 2012-08-16 NOTE — Telephone Encounter (Signed)
rx called into pharmacy

## 2012-08-16 NOTE — Telephone Encounter (Signed)
Okay #[phone] x [phone] 

## 2012-08-16 NOTE — Telephone Encounter (Signed)
Last refilled 07/07/12

## 2012-08-29 ENCOUNTER — Ambulatory Visit (INDEPENDENT_AMBULATORY_CARE_PROVIDER_SITE_OTHER): Payer: BC Managed Care – PPO | Admitting: Cardiovascular Disease

## 2012-08-29 ENCOUNTER — Encounter: Payer: Self-pay | Admitting: Cardiovascular Disease

## 2012-08-29 VITALS — BP 140/70 | HR 75 | Ht 65.0 in | Wt 232.1 lb

## 2012-08-29 DIAGNOSIS — I2510 Atherosclerotic heart disease of native coronary artery without angina pectoris: Secondary | ICD-10-CM

## 2012-08-29 DIAGNOSIS — R079 Chest pain, unspecified: Secondary | ICD-10-CM

## 2012-08-29 DIAGNOSIS — R0602 Shortness of breath: Secondary | ICD-10-CM

## 2012-08-29 NOTE — Assessment & Plan Note (Signed)
Currently with no symptoms of angina. No further workup at this time. Continue current medication regimen. Symptoms of squeezing in his chest, and at rest, not likely secondary to angina. We will hold off on any stress testing or repeat catheterization.

## 2012-08-29 NOTE — Assessment & Plan Note (Signed)
For his chest pain at rest, we have suggested he stay and from a sitting position, 5 to drink carbonation. He can try Gas-X. Uncertain if he is experiencing symptoms from a hiatal hernia. He does report having a hernia in the past. If no relief, other options include a chest CT scan. Symptoms are at rest, not exertional.

## 2012-08-29 NOTE — Assessment & Plan Note (Signed)
Uncertain if he has underlying lung disease. Shortness of breath did not improve after bypass surgery. Unable to exclude his weight as an issue. Symptoms seem to occur at rest. Possibly his stomach is stopping his lungs from expanding.  he is concerned about underlying lung disease. We'll refer him to pulmonary for evaluation . He does report having PFTs prior to bypass surgery .

## 2012-08-29 NOTE — Patient Instructions (Addendum)
You are doing well. No medication changes were made.  Please call us if you have new issues that need to be addressed before your next appt.  Your physician wants you to follow-up in: 6 months.  You will receive a reminder letter in the mail two months in advance. If you don't receive a letter, please call our office to schedule the follow-up appointment.   

## 2012-08-29 NOTE — Progress Notes (Signed)
Patient ID: [name] [name], male    DOB: 20-Jun-1951, 61 y.o.   MRN: [id number]  HPI Comments: 61 year old gentleman with obesity, hyperlipidemia, diabetes, hypertension, patient of Dr. Alphonsus Sias, Who initially presented in November for chest pain. Cardiac catheterization showed severe LAD disease and he had bypass surgery November 12 2011. (Coronary artery bypass grafting x3 with the left internal mammary to the left anterior descending coronary artery, reversed saphenous vein graft sequentially to the first and third diagonals with right thigh and the vein harvesting.) He presents for routine followup.  Previous symptoms of shortness of breath have persisted despite bypass surgery. Echocardiogram showing normal LV function and normal right-sided pressures. He is very bothered by his shortness of breath symptoms that seem to present while he is sitting, rarely with activity. He is now working full-time on his farm and doing heavy manual labor.  Weight is essentially unchanged. He does report significant occupational exposure to various agents. He wonders if this has caused his shortness of breath symptoms.  He also reports having a squeezing sensation in his chest like somebody has him in a "bear hug". Symptoms will last for 30 minutes at a time, start her for several days then seemed to resolve. The symptoms seemed to have become worse since his bypass surgery. He wonders if his mediastinum was made too tight after the surgery.   His third issue is diffuse leg, hip, joint pain particularly in his hips knees and feet. Symptoms are worse in the evenings. No improvement in his symptoms by holding his statin. He has not tried over-the-counter medications for pain.  Hemoglobin A1c 8.1  Previous  Echocardiogram done recently for his shortness of breath showed normal function, no significant pulmonary hypertension. Normal right ventricular systolic pressure.  His diet is poor and we discussed the foods  that he needs to avoid for diabetes. Holding Crestor did not help his leg pain to go away.  He works on Anim[employer] and wonders if his legs hurt secondary to working on such a hard surface. Pain is in the muscles, as well as the knees.  EKG shows normal sinus rhythm with rate 77 beats per minute, no significant ST or T wave changes    Outpatient Encounter Prescriptions as of 08/29/2012  Medication Sig Dispense Refill  . aspirin 81 MG tablet Take 2 tablets (162 mg total) by mouth daily.      . furosemide (LASIX) 40 MG tablet Take 1 tablet (40 mg total) by mouth 2 (two) times daily as needed.  180 tablet  3  . glucose blood (FREESTYLE LITE) test strip Use as instructed, patient tests once daily. Dx: 250.00  300 each  3  . HYDROcodone-acetaminophen (NORCO/VICODIN) 5-325 MG per tablet Take 1 tablet by mouth 2 (two) times daily as needed.  60 tablet  0  . lisinopril (PRINIVIL,ZESTRIL) 10 MG tablet Take 1 tablet (10 mg total) by mouth daily.  90 tablet  3  . metFORMIN (GLUCOPHAGE) 1000 MG tablet Take 500 mg by mouth 2 (two) times daily with a meal.      . pioglitazone (ACTOS) 30 MG tablet TAKE 1 TABLET DAILY  90 tablet  0  . potassium chloride SA (K-DUR,KLOR-CON) 20 MEQ tablet Take 1 tablet (20 mEq total) by mouth 2 (two) times daily.  180 tablet  3    Review of Systems  Constitutional: Negative.   HENT: Negative.   Eyes: Negative.   Respiratory: Positive for chest tightness and shortness of breath.  Cardiovascular: Negative.   Gastrointestinal: Negative.   Musculoskeletal: Positive for myalgias and arthralgias.  Skin: Negative.   Neurological: Negative.   Hematological: Negative.   Psychiatric/Behavioral: Negative.   All other systems reviewed and are negative.   BP 140/70  Pulse 75  Ht 5\' 5"  (1.651 m)  Wt 232 lb 2 oz (105.291 kg)  BMI 38.63 kg/m2  Physical Exam  Nursing note and vitals reviewed. Constitutional: He is oriented to person, place, and time. He appears well-developed and  well-nourished.       obese  HENT:  Head: Normocephalic.  Nose: Nose normal.  Mouth/Throat: Oropharynx is clear and moist.  Eyes: Conjunctivae normal are normal. Pupils are equal, round, and reactive to light.  Neck: Normal range of motion. Neck supple. No JVD present.  Cardiovascular: Normal rate, regular rhythm, S1 normal, S2 normal, normal heart sounds and intact distal pulses.  Exam reveals no gallop and no friction rub.   No murmur heard.      Well-healed midline scar in the sternum  Pulmonary/Chest: Effort normal. No respiratory distress. He has no decreased breath sounds. He has no wheezes. He has no rales. He exhibits no tenderness.  Abdominal: Soft. Bowel sounds are normal. He exhibits no distension. There is no tenderness.  Musculoskeletal: Normal range of motion. He exhibits no edema and no tenderness.  Lymphadenopathy:    He has no cervical adenopathy.  Neurological: He is alert and oriented to person, place, and time. Coordination normal.  Skin: Skin is warm and dry. No rash noted. No erythema.  Psychiatric: He has a normal mood and affect. His behavior is normal. Judgment and thought content normal.           Assessment and Plan        Goal will

## 2012-09-20 ENCOUNTER — Other Ambulatory Visit: Payer: Self-pay | Admitting: Internal Medicine

## 2012-09-25 ENCOUNTER — Other Ambulatory Visit: Payer: Self-pay | Admitting: Radiology

## 2012-09-25 MED ORDER — HYDROCODONE-ACETAMINOPHEN 5-325 MG PO TABS: 1.0000 | ORAL_TABLET | Freq: Two times a day (BID) | ORAL | Status: DC | PRN

## 2012-09-25 NOTE — Telephone Encounter (Signed)
Okay #[phone] x [phone] 

## 2012-09-25 NOTE — Telephone Encounter (Signed)
rx called into pharmacy

## 2012-09-27 NOTE — Telephone Encounter (Signed)
Pt left v/m requesting status of Hydrocodone refill; [facility] has not received. I spoke with Rob at [facility] he will get transfer from [facility] [address]. If problem Rob will call our office. Left v/m for pt to call back.

## 2012-09-28 NOTE — Telephone Encounter (Signed)
Pt left v/m that hydrocodone was not at [facility]. I called [facility] spoke with Rob he said he had spoken with pt and hydrocodone is ready for pt and pt is aware he can pick up med. I left v/m on pts home phone to this effect and advised pt if anything else needed to please call office.

## 2012-10-01 ENCOUNTER — Other Ambulatory Visit: Payer: Self-pay | Admitting: Internal Medicine

## 2012-10-03 ENCOUNTER — Encounter: Payer: Self-pay | Admitting: Pulmonary Disease

## 2012-10-03 ENCOUNTER — Ambulatory Visit (INDEPENDENT_AMBULATORY_CARE_PROVIDER_SITE_OTHER): Payer: BC Managed Care – PPO | Admitting: Pulmonary Disease

## 2012-10-03 VITALS — BP 142/70 | HR 73 | Temp 98.3°F | Ht 66.0 in | Wt 232.8 lb

## 2012-10-03 DIAGNOSIS — R0609 Other forms of dyspnea: Secondary | ICD-10-CM

## 2012-10-03 DIAGNOSIS — R0602 Shortness of breath: Secondary | ICD-10-CM

## 2012-10-03 DIAGNOSIS — G4730 Sleep apnea, unspecified: Secondary | ICD-10-CM

## 2012-10-03 DIAGNOSIS — I2510 Atherosclerotic heart disease of native coronary artery without angina pectoris: Secondary | ICD-10-CM

## 2012-10-03 DIAGNOSIS — R0600 Dyspnea, unspecified: Secondary | ICD-10-CM

## 2012-10-03 NOTE — Assessment & Plan Note (Addendum)
The differential diagnosis for [name] [name] shortness of breath is broad I think the most likely etiologies are deconditioning, restrictive lung disease secondary to obesity and his body shape, and less likely asthma or diaphragm injury from his surgery. Followup chest imaging in January of 2013 showed a left-sided pleural effusion which would obscure an elevated left hemidiaphragm.  He describes intermittent shortness of breath and wheezing but this is not associated with coughing or wheezing so I think asthma is less likely. Further, he has no airway obstruction on his lung disease which rules out COPD and makes asthma seem less likely.  Plan: -Chest x-ray at the [facility] office to evaluate for hemidiaphragm elevation -We will try a trial of a short acting albuterol inhaler to see if it helps her shortness of breath -I advised weight loss and exercise as I think this will be the most helpful intervention -Consider full pulmonary function testing and methacholine challenge testing if no improvement with weight loss and/or albuterol.

## 2012-10-03 NOTE — Progress Notes (Signed)
Subjective:    Patient ID: [name] [name], male    DOB: 01-28-1951, 61 y.o.   MRN: [id number]  HPI  This is a very pleasant 61 year old gentleman with coronary artery disease who is referred to our clinic for evaluation of shortness of breath. When he was a child he had no diagnosis of a respiratory illness but he says he has always felt some shortness of breath throughout most of his life. This is occurring more frequently in 2013 but was also associated with shoulder and back tightness. He was found to have coronary artery disease and underwent a coronary artery bypass graft performed by Dr. Lyn Henri at [facility] in December of 2013. After surgery the shoulder and back pain went away but he still has the shortness of breath. He does not think that it has increased since surgery but he was expecting that it would've gone away. In the last several months he says he's been given diuretics to treat his shortness of breath but he says that this has never helped. He initially lost 25 pounds after surgery but says that this did not help significantly either. Of note he has since gained that 25 pounds back.  When describing his symptoms he says that he has intermittent episodes of shortness of breath that occur both at rest and on exertion. It sounds like the episodes occur at rest or the ones that bother him the most. He says that he will note the gradual onset of increasing shortness of breath and the sensation that he cannot get enough air in. He will eventually have to take several deep breaths and then after several minutes this will subside. The shortness of breath will be more frequent for several days on end, typically 3-4 days in a row. 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 He does notice increasing shortness of breath when bending over and picking things up.  He rarely if ever coughs and has  minimal sinus symptoms. He says that he has no acid reflux symptoms.  He has obstructive sleep apnea and uses it on a regular basis. He sometimes has sinus congestion in the morning after using his CPAP machine.  He smoked a few cigarettes in college but has never been a regular smoker.  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      Past Medical History  Diagnosis Date  . BPH (benign prostatic hypertrophy)   . Hx of colonic polyps   . Hyperlipidemia   . Hypertension   . Cerebral aneurysm   . Carpal tunnel syndrome   . Arthritis   . Kidney stones   . Angina   . Shortness of breath   . OSA (obstructive sleep apnea)     uses cpap  . GERD (gastroesophageal reflux disease)   . Diabetes mellitus     type 2  . Coronary artery disease 11/12/11    s/p CABG x 3     Family History  Problem Relation Age of Onset  . Coronary artery disease Father   . Diabetes Neg Hx   . Hypertension Neg Hx   . Prostate cancer Father      History   Social History  . Marital Status: Married    Spouse Name: N/A  Number of Children: 3  . Years of Education: N/A   Occupational History  . tool maker    Social History Main Topics  . Smoking status: Never Smoker   . Smokeless tobacco: Never Used  . Alcohol Use: No  . Drug Use: No  . Sexually Active: Yes   Other Topics Concern  . Not on file   Social History Narrative  . No narrative on file     Allergies  Allergen Reactions  . Crestor (Rosuvastatin)     Muscle aches.  . Doxazosin Mesylate     REACTION: didn't work and may have caused SOB  . Doxycycline     REACTION: unspecified  . Glimepiride     REACTION: red eyes  . Glipizide     REACTION: myalgia??  . Losartan Potassium-Hctz     REACTION: chest \\T \ leg pain  . Pravastatin     Muscle aches   . Rosiglitazone Maleate     REACTION: myalgia  . Simvastatin      REACTION: myalgias     Outpatient Prescriptions Prior to Visit  Medication Sig Dispense Refill  . aspirin 81 MG tablet Take 2 tablets (162 mg total) by mouth daily.      Marland Kitchen glucose blood (FREESTYLE LITE) test strip Use as instructed, patient tests once daily. Dx: 250.00  300 each  3  . HYDROcodone-acetaminophen (NORCO/VICODIN) 5-325 MG per tablet Take 1 tablet by mouth 2 (two) times daily as needed.  60 tablet  0  . lisinopril (PRINIVIL,ZESTRIL) 10 MG tablet Take 1 tablet (10 mg total) by mouth daily.  90 tablet  3  . metFORMIN (GLUCOPHAGE) 1000 MG tablet Take 500 mg by mouth 2 (two) times daily with a meal.      . pioglitazone (ACTOS) 30 MG tablet TAKE 1 TABLET DAILY  90 tablet  3  . potassium chloride SA (K-DUR,KLOR-CON) 20 MEQ tablet Take 1 tablet (20 mEq total) by mouth 2 (two) times daily.  180 tablet  3  . furosemide (LASIX) 40 MG tablet Take 1 tablet (40 mg total) by mouth 2 (two) times daily as needed.  180 tablet  3  . metFORMIN (GLUCOPHAGE) 1000 MG tablet TAKE ONE TABLET BY MOUTH TWICE DAILY BEFORE MEALS FOR DIABETES  180 tablet  2      Review of Systems  Constitutional: Negative for fever, chills, activity change and appetite change.  HENT: Negative for hearing loss, ear pain, congestion, rhinorrhea, sneezing, neck pain, neck stiffness, postnasal drip and sinus pressure.   Eyes: Negative for redness, itching and visual disturbance.  Respiratory: Positive for shortness of breath. Negative for cough, chest tightness and wheezing.   Cardiovascular: Positive for leg swelling. Negative for chest pain and palpitations.  Gastrointestinal: Negative for nausea, vomiting, abdominal pain, diarrhea, constipation, blood in stool and abdominal distention.  Musculoskeletal: Positive for arthralgias. Negative for myalgias, joint swelling and gait problem.  Skin: Negative for rash.  Neurological: Negative for dizziness, light-headedness, numbness and headaches.  Hematological: Does not  bruise/bleed easily.  Psychiatric/Behavioral: Negative for confusion and dysphoric mood.       Objective:   Physical Exam  Filed Vitals:   10/03/12 1635  BP: 142/70  Pulse: 73  Temp: 98.3 F (36.8 C)  TempSrc: Oral  Height: 5\' 6"  (1.676 m)  Weight: 232 lb 12.8 oz (105.597 kg)  SpO2: 98%  on room air  Gen: obese, well appearing, no acute distress HEENT: NCAT, PERRL, EOMi, OP clear, neck supple without masses PULM:  CTA B CV: RRR, no mgr, no JVD AB: obese abdomen, BS+, soft, nontender, no hsm Ext: warm, no edema, no clubbing, no cyanosis Derm: no rash or skin breakdown Neuro: A&Ox4, CN II-XII intact, strength 5/5 in all 4 extremities  12/2011 CXR: small left pleural effusion, otherwise normal by my read (described by radiology as "COPD") 10/02/2012 simple spiro>> normal     Assessment & Plan:   Shortness of breath The differential diagnosis for Mr. Ullom shortness of breath is broad I think the most likely etiologies are deconditioning, restrictive lung disease secondary to obesity and his body shape, and less likely asthma or diaphragm injury from his surgery. Followup chest imaging in January of 2013 showed a left-sided pleural effusion which would obscure an elevated left hemidiaphragm.  He describes intermittent shortness of breath and wheezing but this is not associated with coughing or wheezing so I think asthma is less likely. Further, he has no airway obstruction on his lung disease which rules out COPD and makes asthma seem less likely.  Plan: -Chest x-ray at the [facility] office to evaluate for hemidiaphragm elevation -We will try a trial of a short acting albuterol inhaler to see if it helps her shortness of breath -I advised weight loss and exercise as I think this will be the most helpful intervention -Consider full pulmonary function testing and methacholine challenge testing if no improvement with weight loss and/or albuterol.  SLEEP APNEA Continue  using CPAP as written.  CAD (coronary artery disease), native coronary artery Since his recent coronary artery bypass surgery his heart function appears to be normal. He is not volume overloaded on exam today and I think that it is less likely that a cardiac abnormality is causing his shortness of breath. He should continue taking the medications as prescribed by Dr. Mariah Milling.    Updated Medication List Outpatient Encounter Prescriptions as of 10/03/2012  Medication Sig Dispense Refill  . aspirin 81 MG tablet Take 2 tablets (162 mg total) by mouth daily.      . furosemide (LASIX) 40 MG tablet Take 40 mg by mouth 2 (two) times daily.      Marland Kitchen glucose blood (FREESTYLE LITE) test strip Use as instructed, patient tests once daily. Dx: 250.00  300 each  3  . HYDROcodone-acetaminophen (NORCO/VICODIN) 5-325 MG per tablet Take 1 tablet by mouth 2 (two) times daily as needed.  60 tablet  0  . lisinopril (PRINIVIL,ZESTRIL) 10 MG tablet Take 1 tablet (10 mg total) by mouth daily.  90 tablet  3  . metFORMIN (GLUCOPHAGE) 1000 MG tablet Take 500 mg by mouth 2 (two) times daily with a meal.      . pioglitazone (ACTOS) 30 MG tablet TAKE 1 TABLET DAILY  90 tablet  3  . potassium chloride SA (K-DUR,KLOR-CON) 20 MEQ tablet Take 1 tablet (20 mEq total) by mouth 2 (two) times daily.  180 tablet  3  . DISCONTD: furosemide (LASIX) 40 MG tablet Take 1 tablet (40 mg total) by mouth 2 (two) times daily as needed.  180 tablet  3  . DISCONTD: metFORMIN (GLUCOPHAGE) 1000 MG tablet TAKE ONE TABLET BY MOUTH TWICE DAILY BEFORE MEALS FOR DIABETES  180 tablet  2

## 2012-10-03 NOTE — Assessment & Plan Note (Signed)
Since his recent coronary artery bypass surgery his heart function appears to be normal. He is not volume overloaded on exam today and I think that it is less likely that a cardiac abnormality is causing his shortness of breath. He should continue taking the medications as prescribed by Dr. Mariah Milling.

## 2012-10-03 NOTE — Patient Instructions (Signed)
Try using the rescue inhaler (Ventolin) we gave you 2 puffs every four hours as needed for shortness of breath Get the Chest X-ray this week at the [facility] Try to lose 20-30 lbs as I think this may help with your shortness of breath We will see you back in 4-6 weeks

## 2012-10-03 NOTE — Assessment & Plan Note (Signed)
Continue using CPAP as written.

## 2012-10-05 ENCOUNTER — Ambulatory Visit (INDEPENDENT_AMBULATORY_CARE_PROVIDER_SITE_OTHER)
Admission: RE | Admit: 2012-10-05 | Discharge: 2012-10-05 | Disposition: A | Discharge status: Home / Self Care | Payer: BC Managed Care – PPO | Source: Ambulatory Visit | Attending: Pulmonary Disease | Admitting: Pulmonary Disease

## 2012-10-05 DIAGNOSIS — R0989 Other specified symptoms and signs involving the circulatory and respiratory systems: Secondary | ICD-10-CM

## 2012-10-05 DIAGNOSIS — R0600 Dyspnea, unspecified: Secondary | ICD-10-CM

## 2012-10-17 ENCOUNTER — Encounter: Payer: Self-pay | Admitting: Gastroenterology

## 2012-10-31 ENCOUNTER — Ambulatory Visit: Payer: BC Managed Care – PPO | Admitting: Pulmonary Disease

## 2012-11-06 ENCOUNTER — Encounter: Payer: Self-pay | Admitting: Pulmonary Disease

## 2012-11-06 ENCOUNTER — Ambulatory Visit (INDEPENDENT_AMBULATORY_CARE_PROVIDER_SITE_OTHER): Payer: BC Managed Care – PPO | Admitting: Pulmonary Disease

## 2012-11-06 VITALS — BP 120/78 | HR 72 | Temp 97.6°F | Ht 65.0 in | Wt 232.0 lb

## 2012-11-06 DIAGNOSIS — R0602 Shortness of breath: Secondary | ICD-10-CM

## 2012-11-06 DIAGNOSIS — M940 Chondrocostal junction syndrome [Tietze]: Secondary | ICD-10-CM

## 2012-11-06 NOTE — Patient Instructions (Signed)
Use ibuprofen as needed for relief of the chest ache Follow up with Dr. Shelle Iron regarding your sleep apnea Try to lose weight and exercise regularly before our next visit We will see you back in 2-3 months or sooner if needed

## 2012-11-06 NOTE — Progress Notes (Signed)
Subjective:    Patient ID: [name] [name], male    DOB: January 14, 1951, 61 y.o.   MRN: [id number]  Synopsis: [name] [name] first saw the [facility] office in 09/2012 for evaluation of shortness of breath.  He is obese, a never smoker, and had a CABG in 11/2011.  Simple spirometry and CXR were normal on his first visit.  We prescribed an albuterol inhaler and advised weight loss.  HPI  11/06/2012 ROV -- [name] [name] returns to clinic today saying that his shortness of breath has not really changed. 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 He denies wheezing he states that there is no clear environmental trigger to his shortness of breath. He has not lost weight since her last visit. He has describes some chest tightness after all the lifting he's been doing with building a barn. This is a very specific pain in the upper area of his chest exacerbated by certain movements. His leg swelling is well-controlled on his current dose of diuretics.   Past Medical History  Diagnosis Date  . BPH (benign prostatic hypertrophy)   . Hx of colonic polyps   . Hyperlipidemia   . Hypertension   . Cerebral aneurysm   . Carpal tunnel syndrome   . Arthritis   . Kidney stones   . Angina   . Shortness of breath   . OSA (obstructive sleep apnea)     uses cpap  . GERD (gastroesophageal reflux disease)   . Diabetes mellitus     type 2  . Coronary artery disease 11/12/11    s/p CABG x 3     Review of Systems  HENT: Negative for congestion, rhinorrhea and postnasal drip.   Respiratory: Positive for shortness of breath. Negative for cough, wheezing and stridor.   Cardiovascular: Positive for chest pain. Negative for palpitations and  leg swelling.       Objective:   Physical Exam  Filed Vitals:   11/06/12 1022  BP: 120/78  Pulse: 72  Temp: 97.6 F (36.4 C)  TempSrc: Oral  Height: 5\' 5"  (1.651 m)  Weight: 232 lb (105.235 kg)  SpO2: 97%    Gen: obese but well appearing, no acute distress HEENT: NCAT, PERRL, EOMi, OP clear, neck supple without masses PULM: CTA B Chest: some tenderness to palpation sterno-costal joint R side CV: RRR, no mgr, no JVD AB: BS+, soft, nontender, no hsm Ext: warm, no edema, no clubbing, no cyanosis      Assessment & Plan:   Shortness of breath Given that his lung exam, exertional oxygenation, CXR, echo, and simple spirometry are normal, there is no clear evidence of significant lung pathology.  I explained to him that I feel that this dyspnea is likely related to his obesity and body shape.  Full pulmonary function testing, methacholine challenge, and possibly CPET testing would help shed more light on the situation.  He would prefer to try to exercise more and lose weight before undergoing further testing which seems reasonable to me.    Plan: -exercise and lose weight -if no improvement in 2-3 months then we will order full PFT's, methacholine challenge, and consider CPET testing  Costochondritis I advised that he use ibuprofen as needed for this pain.    Updated Medication List Outpatient Encounter Prescriptions as of 11/06/2012  Medication Sig Dispense Refill  . albuterol (VENTOLIN HFA) 108 (90 BASE) MCG/ACT inhaler Inhale 2 puffs into the lungs every 6 (six) hours as needed.      Marland Kitchen aspirin 81 MG tablet Take 2 tablets (162 mg total) by mouth daily.      . furosemide (LASIX) 40 MG tablet Take 40 mg by mouth 2 (two) times daily.      Marland Kitchen glucose blood (FREESTYLE LITE) test strip Use as instructed, patient tests once daily. Dx: 250.00  300 each  3  . HYDROcodone-acetaminophen (NORCO/VICODIN) 5-325 MG per tablet Take 1 tablet by mouth 2 (two) times daily as needed.  60 tablet   0  . lisinopril (PRINIVIL,ZESTRIL) 10 MG tablet Take 1 tablet (10 mg total) by mouth daily.  90 tablet  3  . metFORMIN (GLUCOPHAGE) 1000 MG tablet Take 500 mg by mouth 2 (two) times daily with a meal.      . pioglitazone (ACTOS) 30 MG tablet TAKE 1 TABLET DAILY  90 tablet  3  . potassium chloride SA (K-DUR,KLOR-CON) 20 MEQ tablet Take 1 tablet (20 mEq total) by mouth 2 (two) times daily.  180 tablet  3

## 2012-11-06 NOTE — Assessment & Plan Note (Signed)
I advised that he use ibuprofen as needed for this pain.

## 2012-11-06 NOTE — Assessment & Plan Note (Signed)
Given that his lung exam, exertional oxygenation, CXR, echo, and simple spirometry are normal, there is no clear evidence of significant lung pathology.  I explained to him that I feel that this dyspnea is likely related to his obesity and body shape.  Full pulmonary function testing, methacholine challenge, and possibly CPET testing would help shed more light on the situation.  He would prefer to try to exercise more and lose weight before undergoing further testing which seems reasonable to me.    Plan: -exercise and lose weight -if no improvement in 2-3 months then we will order full PFT's, methacholine challenge, and consider CPET testing

## 2012-11-08 ENCOUNTER — Other Ambulatory Visit: Payer: Self-pay | Admitting: *Deleted

## 2012-11-09 MED ORDER — HYDROCODONE-ACETAMINOPHEN 5-325 MG PO TABS: 1.0000 | ORAL_TABLET | Freq: Two times a day (BID) | ORAL | Status: DC | PRN

## 2012-11-09 NOTE — Telephone Encounter (Signed)
Okay #[phone] x [phone] 

## 2012-11-09 NOTE — Telephone Encounter (Signed)
rx called into pharmacy

## 2012-11-14 ENCOUNTER — Encounter: Payer: Self-pay | Admitting: Pulmonary Disease

## 2012-11-14 ENCOUNTER — Ambulatory Visit (INDEPENDENT_AMBULATORY_CARE_PROVIDER_SITE_OTHER): Payer: BC Managed Care – PPO | Admitting: Pulmonary Disease

## 2012-11-14 VITALS — BP 130/60 | HR 74 | Temp 97.7°F | Ht 65.0 in | Wt 243.8 lb

## 2012-11-14 DIAGNOSIS — G4730 Sleep apnea, unspecified: Secondary | ICD-10-CM

## 2012-11-14 NOTE — Patient Instructions (Addendum)
Will get you a new cpap machine, and keep on the same pressure.  Let us know if you are not doing well with this. Work on weight loss. followup with me in one year if doing well.

## 2012-11-14 NOTE — Progress Notes (Signed)
  Subjective:    Patient ID: [name] [name], male    DOB: 07/21/1951, 61 y.o.   MRN: [id number]  HPI The patient is a 61 year old male who comes in today for management of obstructive sleep apnea.  He was diagnosed approximately 15 years ago, and started on CPAP.  He did extremely well with the device, however is overdue for a new machine and supplies.  His current machine is over 55 years old, and he has been having issues with the On off button and pressure ramping.  He currently uses a nasal CPAP mask, and is not using his heated humidifier.  The patient feels that he sleeps very well with his device, and is rested in the mornings upon arising.  He has excellent alertness during the day, and his Epworth score is only 2.  The patient states that his weight is neutral over the last 2 years.  Sleep Questionnaire: What time do you typically go to bed?( Between what hours) 2a-3a How long does it take you to fall asleep? 10 minutes How many times during the night do you wake up? 3 What time do you get out of bed to start your day? 1000 Do you drive or operate heavy machinery in your occupation? No How much has your weight changed (up or down) over the past two years? (In pounds) 20 lb (9.072 kg) Have you ever had a sleep study before? Yes If yes, location of study? [facility] If yes, date of study? 10-13 years ago Do you currently use CPAP? Yes If so, what pressure? 10 Do you wear oxygen at any time? No    Review of Systems  Constitutional: Negative for fever and unexpected weight change.  HENT: Positive for congestion, rhinorrhea, sneezing, postnasal drip and sinus pressure. Negative for ear pain, nosebleeds, sore throat, trouble swallowing and dental problem.        When patient removes CPAP in mornings  Eyes: Negative for redness and itching.  Respiratory: Positive for shortness of breath ( Since x 11/2011   See Dr [facility] for breathing issues). Negative for cough, chest tightness and wheezing.    Cardiovascular: Positive for leg swelling. Negative for palpitations.  Gastrointestinal: Negative for nausea and vomiting.  Genitourinary: Negative for dysuria.  Musculoskeletal: Negative for joint swelling.  Skin: Negative for rash.  Neurological: Positive for headaches.  Hematological: Does not bruise/bleed easily.  Psychiatric/Behavioral: Negative for dysphoric mood. The patient is not nervous/anxious.        Objective:   Physical Exam Constitutional:  Obese male , no acute distress  HENT:  Nares patent without discharge, deviated septum to left with narrowing.  Oropharynx without exudate, palate and uvula are mildly elongated and thickening.  Eyes:  Perrla, eomi, no scleral icterus  Neck:  No JVD, no TMG  Cardiovascular:  Normal rate, regular rhythm, no rubs or gallops.  No murmurs        Intact distal pulses  Pulmonary :  Normal breath sounds, no stridor or respiratory distress   No rales, rhonchi, or wheezing  Abdominal:  Soft, nondistended, bowel sounds present.  No tenderness noted.   Musculoskeletal:  No lower extremity edema noted.  Lymph Nodes:  No cervical lymphadenopathy noted  Skin:  No cyanosis noted  Neurologic:  Alert, appropriate, moves all 4 extremities without obvious deficit.         Assessment & Plan:

## 2012-11-14 NOTE — Assessment & Plan Note (Signed)
The patient has a history of significant obstructive sleep apnea, and has done well over the years on CPAP.  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  I have also encouraged the patient to work aggressively on weight loss, and to keep up with his mask changes and supplies.  He is to call us if he is having issues with his sleep or daytime alertness once he gets his new CPAP device.  We can always optimize his pressure again.

## 2012-11-20 ENCOUNTER — Telehealth: Payer: Self-pay | Admitting: Pulmonary Disease

## 2012-11-20 NOTE — Telephone Encounter (Signed)
Called and spoke with pt's wife. Pt had just left for work. Wife advised that I contact patient at work # [phone] or cell # [phone] after 3:30. Rhonda J Cobb

## 2012-11-20 NOTE — Telephone Encounter (Signed)
Called and spoke with patient and he stated to just leave the order at [facility]. He already has an appointment with them tomorrow, and he wants this processed before the end of the year. Nothing else needed. Rhonda J Cobb

## 2012-12-05 ENCOUNTER — Other Ambulatory Visit: Payer: Self-pay | Admitting: *Deleted

## 2012-12-05 NOTE — Telephone Encounter (Signed)
Ok to refill? Last filled 11/08/12 and last OV 07/26/12.

## 2012-12-07 MED ORDER — HYDROCODONE-ACETAMINOPHEN 5-325 MG PO TABS: 1.0000 | ORAL_TABLET | Freq: Two times a day (BID) | ORAL | Status: DC | PRN

## 2012-12-07 NOTE — Telephone Encounter (Signed)
plz phone in. 

## 2012-12-08 NOTE — Telephone Encounter (Signed)
Rx called in as directed.   

## 2012-12-10 IMAGING — CR DG CHEST 1V PORT
1 series · 1 of 1 positions shown · non-contrast
Comparison: 11/12/2011

CLINICAL DATA: Postop CABG.

PORTABLE CHEST - 1 VIEW

[AP]
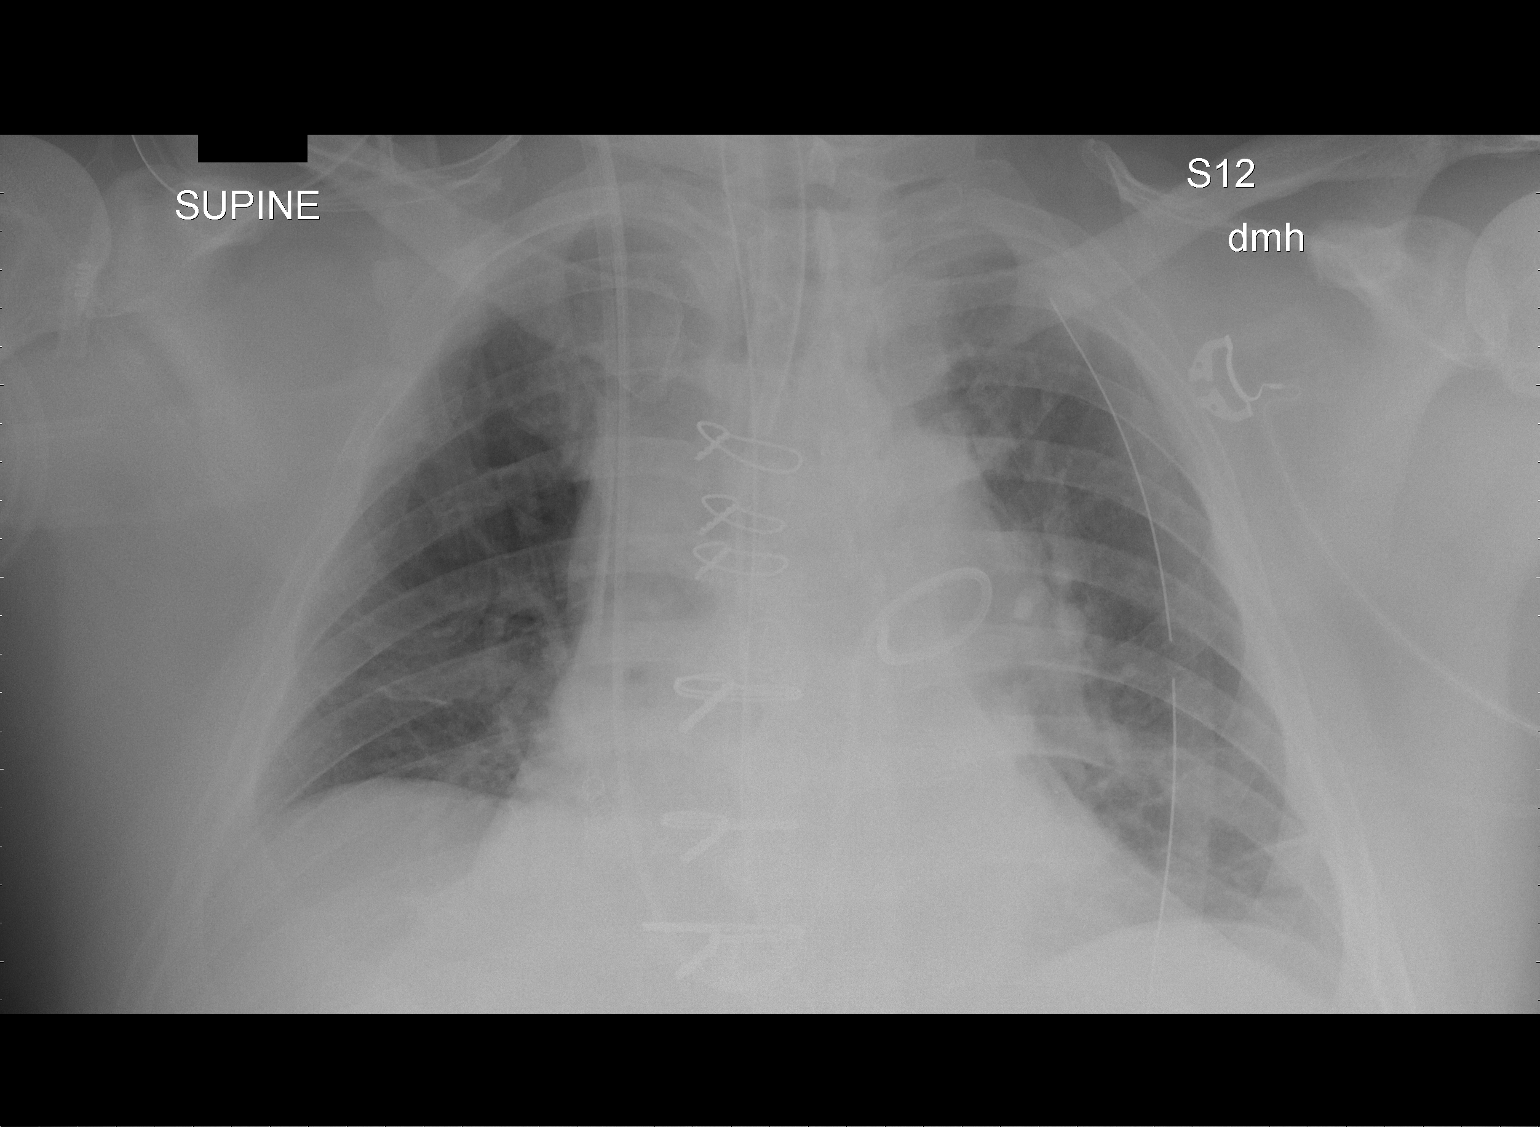

[1 of 1 positions shown; findings below may reference images not displayed]

FINDINGS: Endotracheal tube is 3 cm above the carina.  Swan-Ganz
catheter coils in the left pulmonary artery.  Left chest tube in
place without pneumothorax.  Change of CABG.  Cardiomegaly with
vascular congestion and bibasilar atelectasis.
IMPRESSION: Postoperative changes.  Swan-Ganz catheter coils in the left
pulmonary artery.  No pneumothorax.

## 2012-12-10 IMAGING — CR DG CHEST 2V
2 series · 2 of 2 positions shown · non-contrast
Comparison: 05/05/2011 radiographs.

CLINICAL DATA: Pre CABG.  Shortness of breath.  History of
hypertension and diabetes.

CHEST - 2 VIEW

[w chest pa]
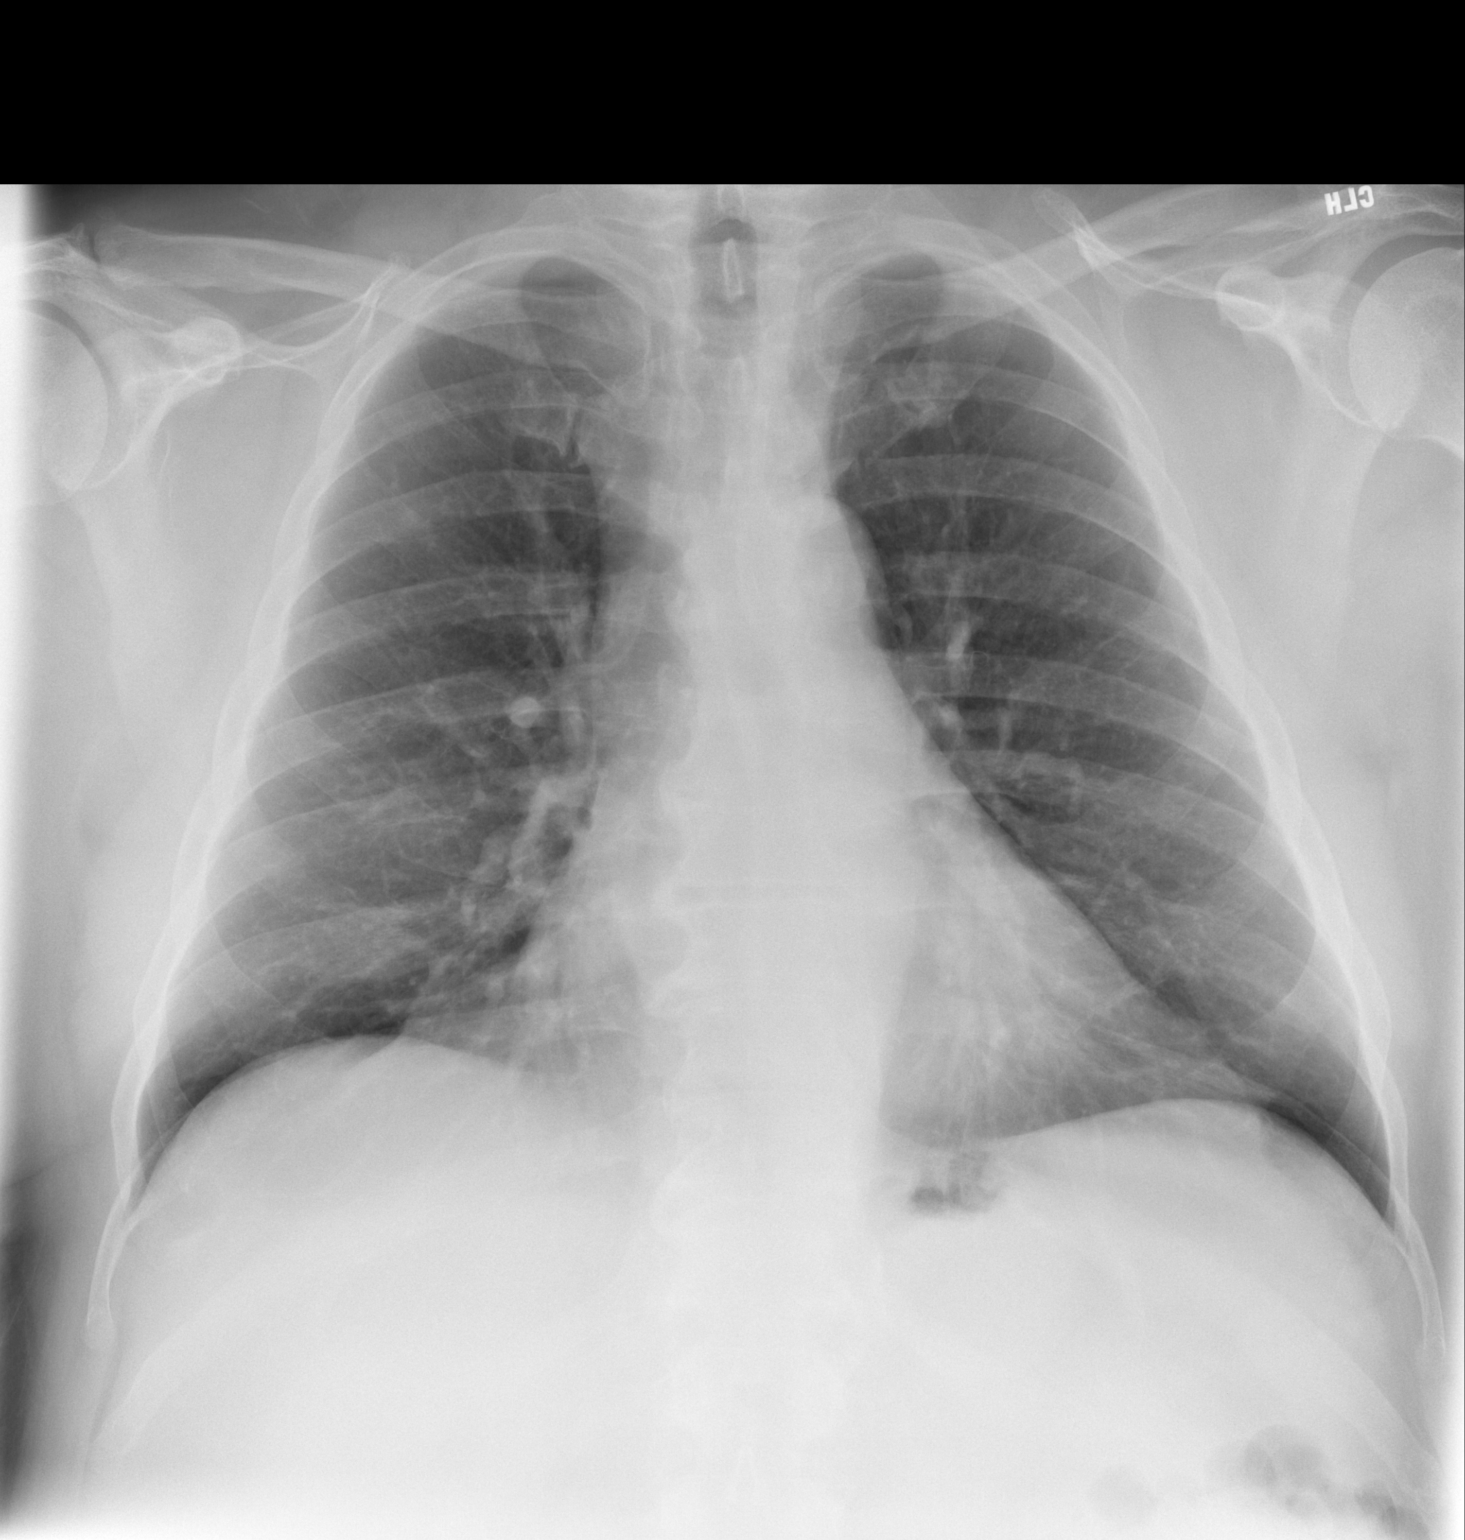

[w chest lat]
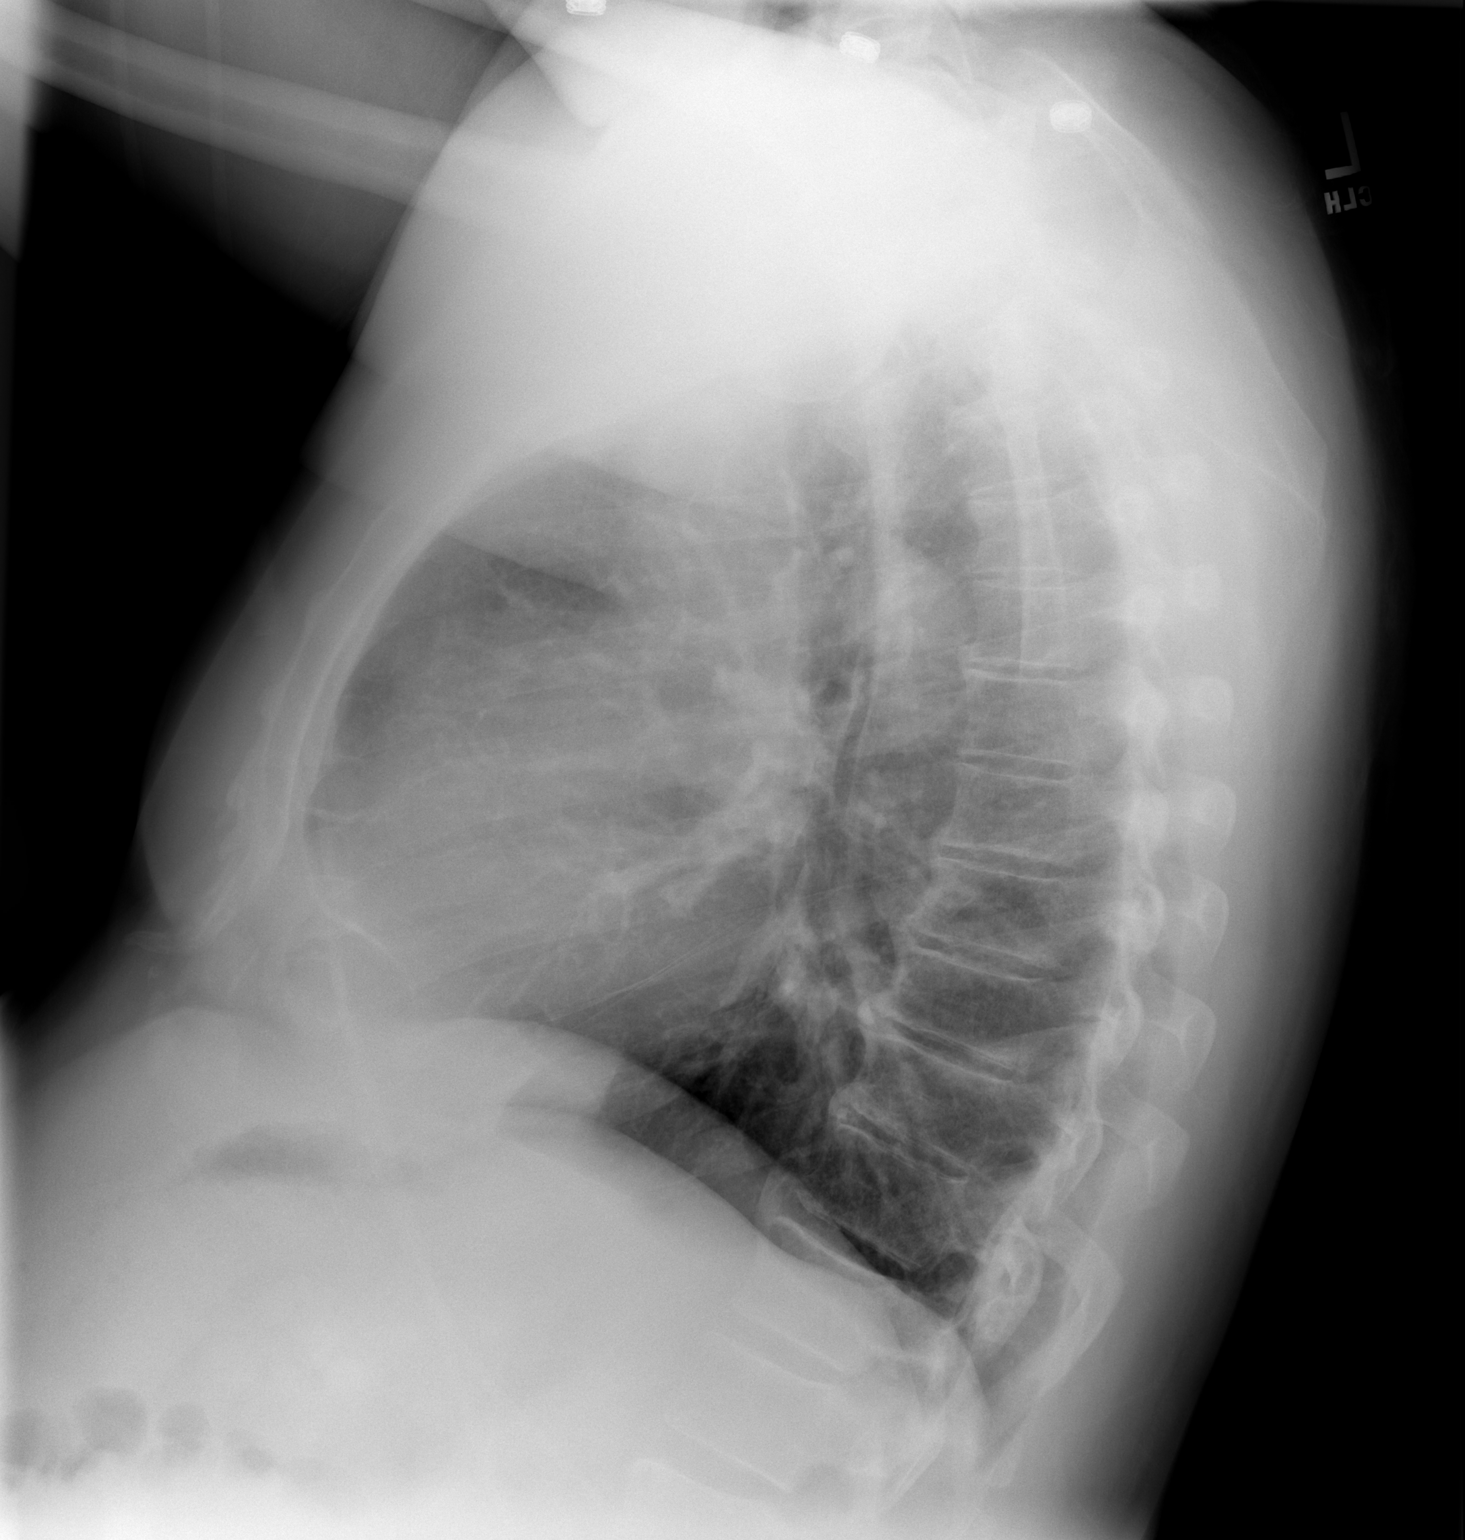

[2 of 2 positions shown; findings below may reference images not displayed]

FINDINGS: The heart size is stable at the upper limits of normal.
Mediastinal contours are unchanged.  The lungs are clear.  There is
no pleural effusion or pneumothorax.  Thoracic spine osteophytes
are unchanged.
IMPRESSION: Stable examination.  No active cardiopulmonary process.

## 2012-12-12 IMAGING — CR DG CHEST 1V PORT
1 series · 1 of 1 positions shown · non-contrast
Comparison: 11/13/2011

CLINICAL DATA: Cardiac surgery

PORTABLE CHEST - 1 VIEW

[view not recorded]
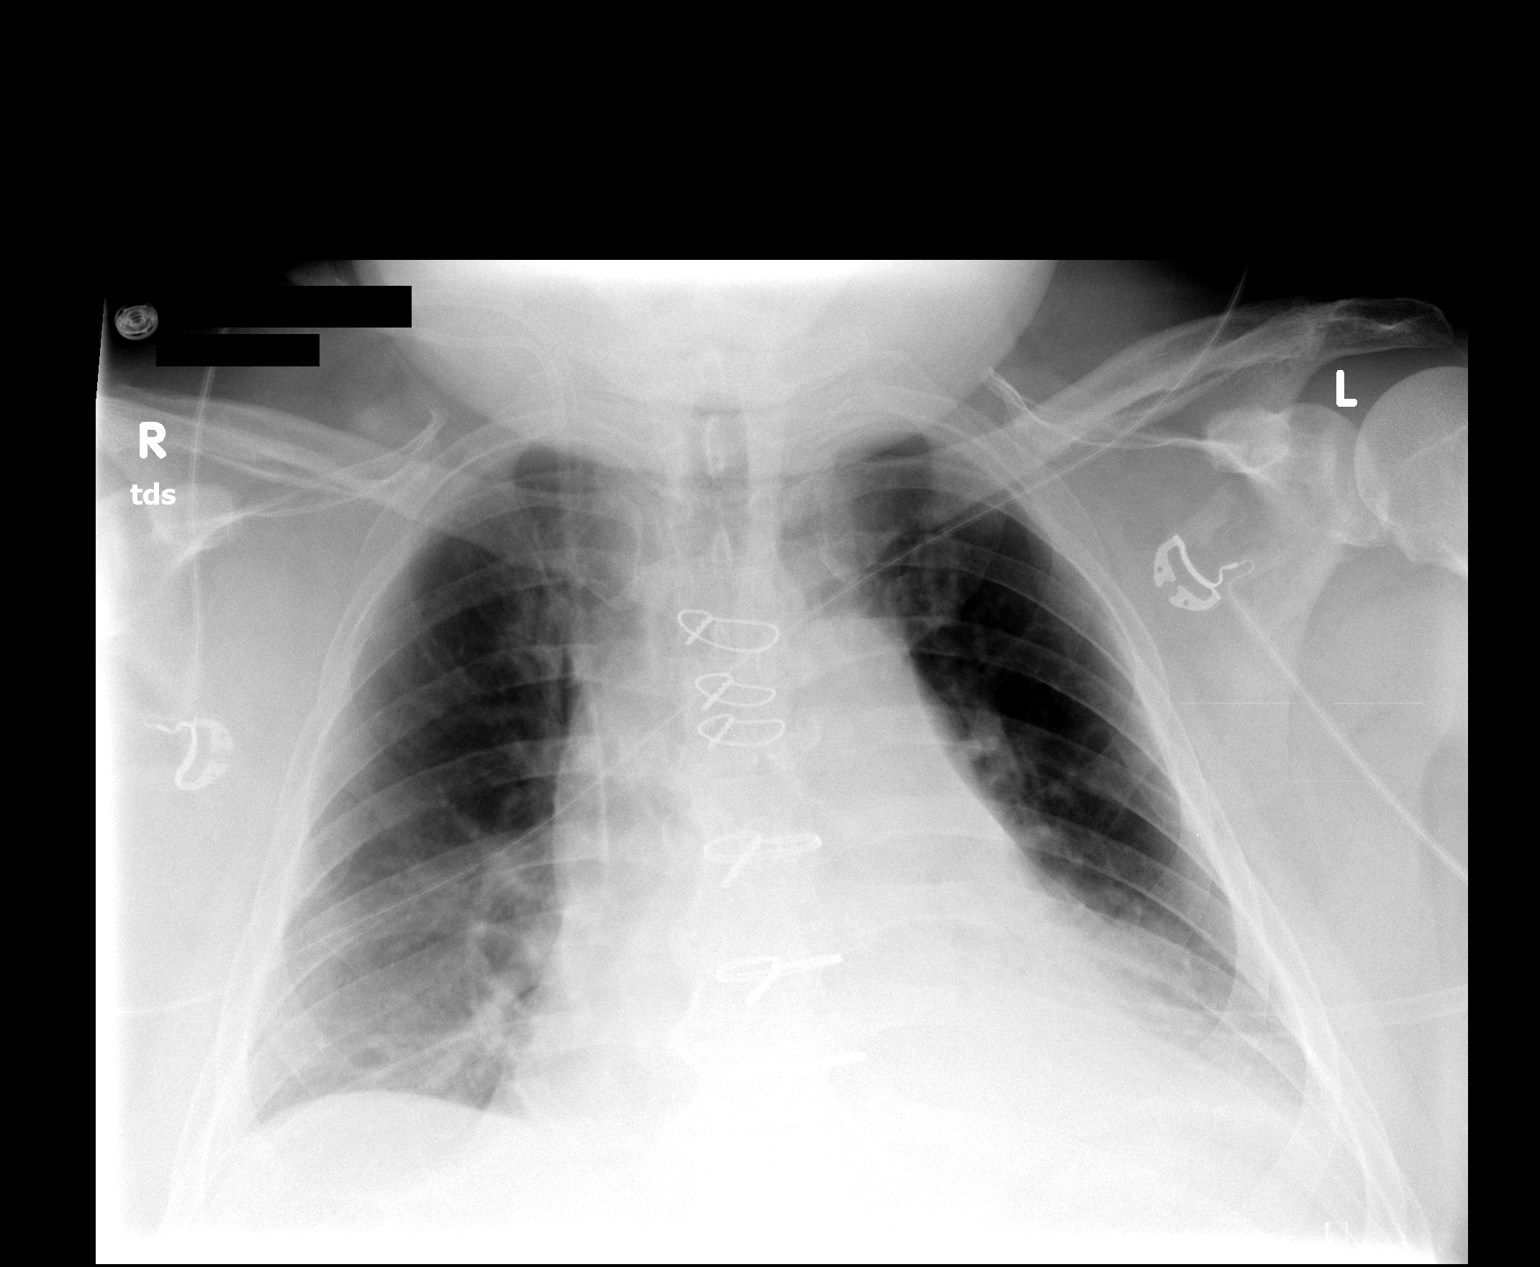

[1 of 1 positions shown; findings below may reference images not displayed]

FINDINGS: Stable right internal jugular vein central venous
catheter.  Swan-Ganz catheter removed with the introducer left in
place.  Cardiomegaly unchanged.  Bibasilar atelectasis unchanged.
No pneumothorax.  No definite edema.  Chest tubes removed.
IMPRESSION: Chest tubes removed without pneumothorax.

Bibasilar atelectasis.

## 2013-01-19 ENCOUNTER — Encounter: Payer: Self-pay | Admitting: Pulmonary Disease

## 2013-01-19 ENCOUNTER — Other Ambulatory Visit: Payer: Self-pay | Admitting: Family Medicine

## 2013-01-22 NOTE — Telephone Encounter (Signed)
rx called into pharmacy

## 2013-01-22 NOTE — Telephone Encounter (Signed)
Okay #[phone] x [phone] 

## 2013-02-14 ENCOUNTER — Encounter: Payer: BC Managed Care – PPO | Admitting: Internal Medicine

## 2013-02-19 ENCOUNTER — Other Ambulatory Visit: Payer: Self-pay | Admitting: *Deleted

## 2013-02-19 NOTE — Telephone Encounter (Signed)
Okay #[phone] x [phone] 

## 2013-02-20 MED ORDER — HYDROCODONE-ACETAMINOPHEN 5-325 MG PO TABS: 1.0000 | ORAL_TABLET | Freq: Two times a day (BID) | ORAL | Status: DC

## 2013-02-20 NOTE — Telephone Encounter (Signed)
Phoned in to [facility]. 

## 2013-02-21 ENCOUNTER — Ambulatory Visit (INDEPENDENT_AMBULATORY_CARE_PROVIDER_SITE_OTHER): Payer: BC Managed Care – PPO | Admitting: Internal Medicine

## 2013-02-21 ENCOUNTER — Encounter: Payer: Self-pay | Admitting: Internal Medicine

## 2013-02-21 VITALS — BP 138/70 | HR 80 | Temp 98.2°F | Ht 65.0 in | Wt 228.0 lb

## 2013-02-21 DIAGNOSIS — Z0000 Encounter for general adult medical examination without abnormal findings: Secondary | ICD-10-CM

## 2013-02-21 DIAGNOSIS — E119 Type 2 diabetes mellitus without complications: Secondary | ICD-10-CM

## 2013-02-21 LAB — BASIC METABOLIC PANEL
BUN: 20 mg/dL (ref 6–23)
Chloride: 97 mEq/L (ref 96–112)
Glucose, Bld: 211 mg/dL — ABNORMAL HIGH (ref 70–99)
Potassium: 4.2 mEq/L (ref 3.5–5.1)

## 2013-02-21 NOTE — Assessment & Plan Note (Signed)
No recent symptoms On appropriate meds  Intolerant of multiple statins

## 2013-02-21 NOTE — Assessment & Plan Note (Signed)
If much worse, will need januvia or lantus If not too bad, may give him 3 months to try harder

## 2013-02-21 NOTE — Assessment & Plan Note (Signed)
UTD on colon Will check PSA Urged flu shot next year Must be more compliant with proper eating

## 2013-02-21 NOTE — Assessment & Plan Note (Signed)
BP Readings from Last 3 Encounters:  02/21/13 138/70  11/14/12 130/60  11/06/12 120/78   Acceptable control Will check met b since on lasix

## 2013-02-21 NOTE — Progress Notes (Signed)
Subjective:    Patient ID: [name] [name], male    DOB: 04-29-1951, 62 y.o.   MRN: [id number]  HPI Here for physical  Still having some breathing problems but seems better over the winter Seems more of an issue in the past couple of days No known environmental allergies  Lots of knee problems Cortisone injections didn't help Worse climbing on ladders, etc at work Has tried OTC MSM, etc supplements. May be helping some  Still has some flaking on eyelids Cortisone helps but it was hard to treat Now trying genteal drops--it has helped some No scalp problems but does have peeling area behind left ear Discussed using T gel on scalp (already uses on beard)  Checks sugars daily Running up some--- 160-170 fasting Has lost weight though Knows his dietary compliance isn't great Active at work and farming  Heart seems to be okay  Current Outpatient Prescriptions on File Prior to Visit  Medication Sig Dispense Refill  . aspirin 81 MG tablet Take 2 tablets (162 mg total) by mouth daily.      . furosemide (LASIX) 40 MG tablet Take 40 mg by mouth 2 (two) times daily.      Marland Kitchen glucose blood (FREESTYLE LITE) test strip Use as instructed, patient tests once daily. Dx: 250.00  300 each  3  . HYDROcodone-acetaminophen (NORCO/VICODIN) 5-325 MG per tablet Take 1 tablet by mouth 2 (two) times daily.  60 tablet  0  . lisinopril (PRINIVIL,ZESTRIL) 10 MG tablet Take 1 tablet (10 mg total) by mouth daily.  90 tablet  3  . metFORMIN (GLUCOPHAGE) 1000 MG tablet Take 500 mg by mouth 2 (two) times daily with a meal.      . pioglitazone (ACTOS) 30 MG tablet TAKE 1 TABLET DAILY  90 tablet  3  . potassium chloride SA (K-DUR,KLOR-CON) 20 MEQ tablet Take 1 tablet (20 mEq total) by mouth 2 (two) times daily.  180 tablet  3   No current facility-administered medications on file prior to visit.    Allergies  Allergen Reactions  . Crestor (Rosuvastatin)     Muscle aches.  . Doxazosin Mesylate    REACTION: didn't work and may have caused SOB  . Doxycycline     REACTION: unspecified  . Glimepiride     REACTION: red eyes  . Glipizide     REACTION: myalgia??  . Losartan Potassium-Hctz     REACTION: chest \\T \ leg pain  . Pravastatin     Muscle aches   . Rosiglitazone Maleate     REACTION: myalgia  . Simvastatin     REACTION: myalgias    Past Medical History  Diagnosis Date  . BPH (benign prostatic hypertrophy)   . Hx of colonic polyps   . Hyperlipidemia   . Hypertension   . Cerebral aneurysm   . Carpal tunnel syndrome   . Arthritis   . Kidney stones   . Angina   . Shortness of breath   . OSA (obstructive sleep apnea)     uses cpap  . GERD (gastroesophageal reflux disease)   . Diabetes mellitus     type 2  . Coronary artery disease 11/12/11    s/p CABG x 3    Past Surgical History  Procedure Laterality Date  . Knee surgery      left  . Meniscus repair  03/08    right knee  . Hernia repair    . Coronary artery bypass graft  11/12/2011    Procedure:  CORONARY ARTERY BYPASS GRAFTING (CABG);  Surgeon: Delight Ovens, MD;  Location: [facility] OR;  Service: Open Heart Surgery;  Laterality: N/A;  Coronary Artery Bypass Graft times three on pump utilizing left internal mammary artery and right saphenous vein harvested endoscopically     Family History  Problem Relation Age of Onset  . Coronary artery disease Father   . Diabetes Neg Hx   . Hypertension Neg Hx   . Prostate cancer Father     History   Social History  . Marital Status: Married    Spouse Name: N/A    Number of Children: 3  . Years of Education: N/A   Occupational History  . tool maker    Social History Main Topics  . Smoking status: Never Smoker   . Smokeless tobacco: Never Used  . Alcohol Use: No  . Drug Use: No  . Sexually Active: Yes   Other Topics Concern  . Not on file   Social History Narrative  . No narrative on file   Review of Systems  Constitutional: Positive for unexpected  weight change. Negative for fatigue.  HENT: Positive for ear pain, congestion, rhinorrhea and dental problem.        Right earache for a few days Regular with dentist---has abscess AM congestion when removes the CPAP  Eyes: Negative for visual disturbance.       No diplopia or unilateral vision loss  Respiratory: Positive for shortness of breath. Negative for cough and chest tightness.   Cardiovascular: Positive for palpitations and leg swelling. Negative for chest pain.       Spell of "vibration feeling" in left chest 2 months ago Edema better on lasix  Gastrointestinal: Positive for nausea. Negative for vomiting, abdominal pain, constipation and blood in stool.       Rare heartburn Does have brief nausea in the morning  Endocrine: Negative for cold intolerance and heat intolerance.  Genitourinary: Positive for frequency and difficulty urinating. Negative for urgency.       Some slow stream and frequency ED--"doesn't work"  Musculoskeletal: Positive for arthralgias. Negative for back pain and joint swelling.       Regular knee pain and occ in fingers  Skin: Negative for rash.       Some flaking along preauricular areas--not bad now  Allergic/Immunologic: Negative for environmental allergies and immunocompromised state.  Neurological: Positive for headaches. Negative for dizziness, syncope, weakness, light-headedness and numbness.       Occasional headaches  Hematological: Negative for adenopathy. Bruises/bleeds easily.  Psychiatric/Behavioral: Negative for sleep disturbance and dysphoric mood. The patient is not nervous/anxious.        Generally sleeps okay with CPAP       Objective:   Physical Exam  Constitutional: He appears well-developed and well-nourished. No distress.  HENT:  Head: Normocephalic and atraumatic.  Right Ear: External ear normal.  Left Ear: External ear normal.  Mouth/Throat: Oropharynx is clear and moist. No oropharyngeal exudate.  Eyes: Conjunctivae and  EOM are normal. Pupils are equal, round, and reactive to light.  Neck: Normal range of motion. Neck supple. No thyromegaly present.  Cardiovascular: Normal rate, regular rhythm, normal heart sounds and intact distal pulses.  Exam reveals no gallop.   No murmur heard. Pulmonary/Chest: Effort normal and breath sounds normal. No respiratory distress. He has no wheezes. He has no rales.  Abdominal: Soft. There is no tenderness.  Musculoskeletal: He exhibits no edema and no tenderness.  Lymphadenopathy:    He has no  cervical adenopathy.  Neurological:  Normal sensation on plantar feet  Skin: No rash noted. No erythema.  Psychiatric: He has a normal mood and affect. His behavior is normal.          Assessment & Plan:

## 2013-02-21 NOTE — Assessment & Plan Note (Signed)
Mostly knees Some improvement with OTC preparation

## 2013-02-27 ENCOUNTER — Encounter: Payer: Self-pay | Admitting: *Deleted

## 2013-03-22 ENCOUNTER — Other Ambulatory Visit: Payer: Self-pay | Admitting: *Deleted

## 2013-03-22 MED ORDER — HYDROCODONE-ACETAMINOPHEN 5-325 MG PO TABS: 1.0000 | ORAL_TABLET | Freq: Two times a day (BID) | ORAL | Status: DC

## 2013-03-22 NOTE — Telephone Encounter (Signed)
rx called into pharmacy

## 2013-03-22 NOTE — Telephone Encounter (Signed)
Ok, #60, 0 refills

## 2013-03-22 NOTE — Telephone Encounter (Signed)
[facility] patient, ok to fill? Last filled 02/20/2013

## 2013-04-06 ENCOUNTER — Other Ambulatory Visit: Payer: Self-pay | Admitting: Cardiovascular Disease

## 2013-04-09 ENCOUNTER — Other Ambulatory Visit: Payer: Self-pay | Admitting: Cardiovascular Disease

## 2013-04-18 ENCOUNTER — Encounter: Payer: Self-pay | Admitting: Physician Assistant

## 2013-04-18 ENCOUNTER — Ambulatory Visit (INDEPENDENT_AMBULATORY_CARE_PROVIDER_SITE_OTHER): Payer: BC Managed Care – PPO | Admitting: Physician Assistant

## 2013-04-18 VITALS — BP 136/80 | HR 75 | Ht 65.0 in | Wt 227.0 lb

## 2013-04-18 DIAGNOSIS — I2510 Atherosclerotic heart disease of native coronary artery without angina pectoris: Secondary | ICD-10-CM

## 2013-04-18 DIAGNOSIS — R600 Localized edema: Secondary | ICD-10-CM | POA: Insufficient documentation

## 2013-04-18 DIAGNOSIS — E785 Hyperlipidemia, unspecified: Secondary | ICD-10-CM

## 2013-04-18 DIAGNOSIS — M1990 Unspecified osteoarthritis, unspecified site: Secondary | ICD-10-CM

## 2013-04-18 DIAGNOSIS — I10 Essential (primary) hypertension: Secondary | ICD-10-CM

## 2013-04-18 DIAGNOSIS — R609 Edema, unspecified: Secondary | ICD-10-CM

## 2013-04-18 MED ORDER — CARVEDILOL 3.125 MG PO TABS: 3.1250 mg | ORAL_TABLET | Freq: Two times a day (BID) | ORAL | Status: DC

## 2013-04-18 MED ORDER — NITROGLYCERIN 0.4 MG SL SUBL: 0.4000 mg | SUBLINGUAL_TABLET | SUBLINGUAL | Status: DC | PRN

## 2013-04-18 NOTE — Assessment & Plan Note (Signed)
Advised to follow-up with [facility] to consider steroid injections +/- utility of knee replacement.

## 2013-04-18 NOTE — Progress Notes (Signed)
Patient ID: [name] [name], male    DOB: Dec 04, 1951, 62 y.o.   MRN: [id number]  HPI Comments: 62 year old gentleman with past medical history significant for CAD s/p CABG x 3 (LIMA-LAD, SVG-D1-D3), type 2 DM, HTN, HLD (intolerant to multiple statins), obesity and OSA (on CPAP) who presents today for follow-up.   He presented in 10/2011 for chest pain. He underwent cardiac catheterization revealing diffuse CAD including severe LAD disease, and subsequently underwent CABG in 11/2011.   Dr. Alphonsus Sias is his PCP. He had annual follow-up in 02/2013, and was noted to have somewhat elevated blood sugars and dietary indiscretion, but doing well overall.   He follows Dr. Kendrick Fries for OSA and has been placed on CPAP. He tolerates this well, and enjoys more restful sleep with it. Weight loss has been stressed for further improvement.   Last follow-up 08/2012 with Dr. 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 He has since been diagnosed with OSA and placed on CPAP. He has recently lost weight and is working on watching what he eats.   He had also endorsed chest pressure described as a "bear hug" lasting for 30 minutes at a time without particular aggravation on exertion, which had occurred for several days leading up to follow-up, but had seemingly improved. This was felt to not represent angina/ACS, however if persistent, chest CT was recommended.  He has been doing well overall. He does note very rare episodes of fleeting left-sided chest discomfort mostly at rest and without associated symptoms. 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 He states he has been more congested due to allergies, and takes as needed antihistamines with improvement. He has chronic bilateral knee pain and has seen [facility] in the past for knee injections with minimal relief. Knee replacement had been previously discussed. He does note mild LE edema.  Denies SOB/DOE, weight increase, abdominal distention or new cough. He denies palpitations or syncope. He admits to occasionally eating salty foods. A lipid panel was drawn recently for a health screen through work, and cholesterol was borderline.   He was previously on Lopressor, but this was discontinued due to mood swings and memory changes. Low-dose Coreg was recommended as an alternative, but was never started for unclear reasons.   2D echo 02/2012- LVEF 55-60%, mild concentric LVH, mild LA dilatation, normal RVSP/PASP  BMET NA              135    02/21/2013 1141 K               4.2    02/21/2013 1141 CL               97    02/21/2013 1141 CO2              29    02/21/2013 1141 GLUCOSE         211*   02/21/2013 1141 BUN              20    02/21/2013 1141 CREATININE      1.1    02/21/2013 1141 CALCIUM         9.5    02/21/2013 1141  Hgb A1c         8.9%   02/21/2013  EKG today demonstrates NSR, 75, subtle 1 mm ST depressions II, III, aVF (unchanged from prior tracings), borderline 1st degree AVB, otherwise no ST/T changes BP 136/80.  Outpatient Encounter Prescriptions as of 04/18/2013  Medication Sig Dispense Refill  . aspirin 81 MG tablet Take 2 tablets (162 mg total) by mouth daily.      . furosemide (LASIX) 40 MG tablet Take 40 mg by mouth 2 (two) times daily as needed.       Marland Kitchen glucose blood (FREESTYLE LITE) test strip Use as instructed, patient tests once daily. Dx: 250.00  300 each  3  . HYDROcodone-acetaminophen (NORCO/VICODIN) 5-325 MG per tablet Take 1 tablet by mouth 2 (two) times daily.  60 tablet  0  . lisinopril (PRINIVIL,ZESTRIL) 10 MG tablet TAKE 1 TABLET DAILY  90 tablet  3  . metFORMIN (GLUCOPHAGE) 1000 MG tablet Take 500 mg by mouth 2 (two) times daily with a meal.      . pioglitazone (ACTOS) 30 MG tablet TAKE 1 TABLET DAILY  90 tablet  3  . potassium chloride SA (K-DUR,KLOR-CON) 20 MEQ tablet Take 20 mEq by mouth 2 (two) times daily as needed.      . [DISCONTINUED]  potassium chloride SA (K-DUR,KLOR-CON) 20 MEQ tablet Take 1 tablet (20 mEq total) by mouth 2 (two) times daily.  180 tablet  3  . [DISCONTINUED] furosemide (LASIX) 40 MG tablet TAKE 1 TABLET TWICE A DAY AS NEEDED  180 tablet  3   No facility-administered encounter medications on file as of 04/18/2013.    Review of Systems  Constitutional: Positive for fatigue. Negative for activity change and unexpected weight change.  HENT: Negative.   Eyes: Negative.   Respiratory: Negative for cough, chest tightness and shortness of breath.   Cardiovascular: Positive for leg swelling. Negative for chest pain and palpitations.  Gastrointestinal: Negative.  Negative for abdominal distention.  Musculoskeletal: Positive for arthralgias.  Skin: Negative.   Neurological: Negative.   Psychiatric/Behavioral: Negative.   All other systems reviewed and are negative.   BP 136/80  Ht 5\' 5"  (1.651 m)  Wt 227 lb (102.967 kg)  BMI 37.77 kg/m2  Physical Exam  Nursing note and vitals reviewed. Constitutional: He is oriented to person, place, and time. He appears well-developed and well-nourished.  obese  HENT:  Head: Normocephalic.  Nose: Nose normal.  Mouth/Throat: Oropharynx is clear and moist.  Eyes: Conjunctivae are normal. Pupils are equal, round, and reactive to light.  Neck: Normal range of motion. Neck supple. No JVD present.  Cardiovascular: Normal rate, regular rhythm, S1 normal, S2 normal, normal heart sounds and intact distal pulses.  Exam reveals no gallop and no friction rub.   No murmur heard. Well-healed midline scar in the sternum  Pulmonary/Chest: Effort normal. No respiratory distress. He has no decreased breath sounds. He has no wheezes. He has no rales. He exhibits no tenderness.  Abdominal: Soft. Bowel sounds are normal. He exhibits no distension. There is no tenderness.  Musculoskeletal: Normal range of motion. He exhibits edema. He exhibits no tenderness.  Trace nonpitting bilateral  pedal edema  Lymphadenopathy:    He has no cervical adenopathy.  Neurological: He is alert and oriented to person, place, and time. Coordination normal.  Skin: Skin is warm and dry. No rash noted. No erythema.  Psychiatric: He has a normal mood and affect. His behavior is normal. Judgment and thought content normal.      Assessment and Plan

## 2013-04-18 NOTE — Assessment & Plan Note (Signed)
Mild. 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 Discussed reducing salt and fluid intake, and taking Lasix on an empty stomach to facilitate absorption. He denies CHF-type symptoms. SOB improved from prior visits since using CPAP machine and losing weight. No functional limitations from a respiratory standpoint.

## 2013-04-18 NOTE — Assessment & Plan Note (Signed)
Statin-intolerant. Experienced significant myalgias on multiple statins. Reports "borderline" cholesterol as part of recent health screen. Advised to call in with these numbers.

## 2013-04-18 NOTE — Assessment & Plan Note (Signed)
Stable. Denies ischemic symptoms, exertional or otherwise. Unchanged ST changes on EKG. He had previously been on Lopressor, but was discontinued due to mood swings and memory changes, which had improved off metoprolol. The recommendation was made to replace with Coreg. This has not yet been started. Will pursue trial of low-dose Coreg. Discussed cardiac benefit of beta blocker therapy, and benefits of this outweighing risks of some of the milder side effects. He will monitor for any changes on this medication, and call the office as necessary. Continue low-dose ASA. Refilled NTG SL PRN. Statin-intolerant.

## 2013-04-18 NOTE — Assessment & Plan Note (Signed)
BP fairly well-controlled. He does have room for beta blocker therapy which will be started today. Carvedilol may have more of an effect on BP given B-1, -2 and alpha activity. Advised to monitor BP and symptoms with this. He will be started on a low-dose, and titrated as tolerated.

## 2013-04-18 NOTE — Patient Instructions (Addendum)
We have sent a new prescription of carvedilol (Coreg) 3.125 mg by mouth twice a day to [facility]. We have also provided refills for nitroglycerin as needed.   Please call us with the results of your cholesterol screening done through work.  We will see you back in 6 months.

## 2013-05-02 ENCOUNTER — Other Ambulatory Visit: Payer: Self-pay

## 2013-05-02 NOTE — Telephone Encounter (Signed)
Pt left v/m had requested refill from [facility] last week; pt is out of med.Please advise.

## 2013-05-03 MED ORDER — HYDROCODONE-ACETAMINOPHEN 5-325 MG PO TABS: 1.0000 | ORAL_TABLET | Freq: Two times a day (BID) | ORAL | Status: DC

## 2013-05-03 NOTE — Telephone Encounter (Signed)
rx called into pharmacy

## 2013-05-03 NOTE — Telephone Encounter (Signed)
Okay #[phone] x [phone] 

## 2013-05-31 ENCOUNTER — Other Ambulatory Visit: Payer: Self-pay | Admitting: *Deleted

## 2013-05-31 MED ORDER — HYDROCODONE-ACETAMINOPHEN 5-325 MG PO TABS: 1.0000 | ORAL_TABLET | Freq: Two times a day (BID) | ORAL | Status: DC

## 2013-05-31 NOTE — Telephone Encounter (Signed)
rx called into pharmacy

## 2013-05-31 NOTE — Telephone Encounter (Signed)
Okay #[phone] x [phone] 

## 2013-05-31 NOTE — Telephone Encounter (Signed)
Faxed refill request from [facility], last filled 05/03/13 for # [phone].

## 2013-07-06 ENCOUNTER — Other Ambulatory Visit: Payer: Self-pay | Admitting: Internal Medicine

## 2013-07-06 NOTE — Telephone Encounter (Signed)
rx called into pharmacy

## 2013-07-06 NOTE — Telephone Encounter (Signed)
Okay #[phone] x [phone] 

## 2013-07-20 ENCOUNTER — Other Ambulatory Visit: Payer: Self-pay | Admitting: Cardiovascular Disease

## 2013-08-08 ENCOUNTER — Other Ambulatory Visit: Payer: Self-pay | Admitting: Internal Medicine

## 2013-08-08 NOTE — Telephone Encounter (Signed)
Okay #[phone] x [phone] 

## 2013-08-08 NOTE — Telephone Encounter (Signed)
Last filled on 07/06/13.

## 2013-08-08 NOTE — Telephone Encounter (Signed)
rx called into pharmacy

## 2013-08-21 ENCOUNTER — Encounter: Payer: Self-pay | Admitting: Radiology

## 2013-08-22 ENCOUNTER — Ambulatory Visit (INDEPENDENT_AMBULATORY_CARE_PROVIDER_SITE_OTHER): Payer: BC Managed Care – PPO | Admitting: Internal Medicine

## 2013-08-22 ENCOUNTER — Encounter: Payer: Self-pay | Admitting: Internal Medicine

## 2013-08-22 VITALS — BP 140/80 | HR 66 | Temp 98.0°F | Wt 225.0 lb

## 2013-08-22 DIAGNOSIS — E785 Hyperlipidemia, unspecified: Secondary | ICD-10-CM

## 2013-08-22 DIAGNOSIS — I2510 Atherosclerotic heart disease of native coronary artery without angina pectoris: Secondary | ICD-10-CM

## 2013-08-22 DIAGNOSIS — M1710 Unilateral primary osteoarthritis, unspecified knee: Secondary | ICD-10-CM

## 2013-08-22 DIAGNOSIS — E118 Type 2 diabetes mellitus with unspecified complications: Secondary | ICD-10-CM | POA: Insufficient documentation

## 2013-08-22 MED ORDER — PIOGLITAZONE HCL 30 MG PO TABS: 30.0000 mg | ORAL_TABLET | Freq: Every day | ORAL | Status: DC

## 2013-08-22 MED ORDER — METFORMIN HCL 1000 MG PO TABS: 1000.0000 mg | ORAL_TABLET | Freq: Two times a day (BID) | ORAL | Status: DC

## 2013-08-22 MED ORDER — METFORMIN HCL 1000 MG PO TABS: 500.0000 mg | ORAL_TABLET | Freq: Two times a day (BID) | ORAL | Status: DC

## 2013-08-22 NOTE — Progress Notes (Signed)
Subjective:    Patient ID: [name] [name], male    DOB: [birth year], 62 y.o.   MRN: [id number]  HPI Here for follow up Discussed his diet He was doing better for a while but recently got "slack" Felt some liquid vitamins had controlled his appetite --but inconsistent with them  Checks sugars 1-2 times per week Usually 128-140 Couple of brief (seconds) dizzy spells--no clear hypoglycemia  No chest pain Episodic dyspnea--has some bad days related to fluid buildup Rx with carvedilol---couldn't tolerate (made him feel bad) No syncope  Uses the hydrocodone bid Does help his knees  Current Outpatient Prescriptions on File Prior to Visit  Medication Sig Dispense Refill  . aspirin 81 MG tablet Take 2 tablets (162 mg total) by mouth daily.      . furosemide (LASIX) 40 MG tablet Take 40 mg by mouth 2 (two) times daily as needed.       Marland Kitchen glucose blood (FREESTYLE LITE) test strip Use as instructed, patient tests once daily. Dx: 250.00  300 each  3  . HYDROcodone-acetaminophen (NORCO/VICODIN) 5-325 MG per tablet TAKE 1 TABLET BY MOUTH TWICE A DAY  60 tablet  0  . lisinopril (PRINIVIL,ZESTRIL) 10 MG tablet TAKE 1 TABLET DAILY  90 tablet  3  . nitroGLYCERIN (NITROSTAT) 0.4 MG SL tablet Place 1 tablet (0.4 mg total) under the tongue every 5 (five) minutes as needed for chest pain.  25 tablet  3  . potassium chloride SA (K-DUR,KLOR-CON) 20 MEQ tablet Take 20 mEq by mouth 2 (two) times daily as needed.       No current facility-administered medications on file prior to visit.    Allergies  Allergen Reactions  . Crestor [Rosuvastatin]     Muscle aches.  . Doxazosin Mesylate     REACTION: didn't work and may have caused SOB  . Doxycycline     REACTION: unspecified  . Glimepiride     REACTION: red eyes  . Glipizide     REACTION: myalgia??  . Losartan Potassium-Hctz     REACTION: chest \\T \ leg pain  . Metoprolol Tartrate     Mood swings, memory changes  . Pravastatin     Muscle  aches   . Rosiglitazone Maleate     REACTION: myalgia  . Simvastatin     REACTION: myalgias  . Carvedilol     Felt bad    Past Medical History  Diagnosis Date  . BPH (benign prostatic hypertrophy)   . Hx of colonic polyps   . Hyperlipidemia   . Hypertension   . Cerebral aneurysm   . Carpal tunnel syndrome   . Arthritis   . Kidney stones   . Angina   . Shortness of breath   . OSA (obstructive sleep apnea)     uses cpap  . GERD (gastroesophageal reflux disease)   . Diabetes mellitus     type 2  . Coronary artery disease 11/12/11    s/p CABG x 3    Past Surgical History  Procedure Laterality Date  . Knee surgery      left  . Meniscus repair  03/08    right knee  . Hernia repair    . Coronary artery bypass graft  11/12/2011    Procedure: CORONARY ARTERY BYPASS GRAFTING (CABG);  Surgeon: Delight Ovens, MD;  Location: [facility] OR;  Service: Open Heart Surgery;  Laterality: N/A;  Coronary Artery Bypass Graft times three on pump utilizing left internal mammary artery and right saphenous  vein harvested endoscopically     Family History  Problem Relation Age of Onset  . Coronary artery disease Father   . Diabetes Neg Hx   . Hypertension Neg Hx   . Prostate cancer Father     History   Social History  . Marital Status: Married    Spouse Name: N/A    Number of Children: 3  . Years of Education: N/A   Occupational History  . tool maker    Social History Main Topics  . Smoking status: Never Smoker   . Smokeless tobacco: Never Used  . Alcohol Use: No  . Drug Use: No  . Sexual Activity: Yes   Other Topics Concern  . Not on file   Social History Narrative  . No narrative on file   Review of Systems Generally sleeps okay Appetite is fine Not depressed but gets "ill" at times per wife Bowels are okay     Objective:   Physical Exam  Constitutional: He appears well-developed and well-nourished. No distress.  Neck: Normal range of motion. Neck supple.   Cardiovascular: Normal rate, regular rhythm, normal heart sounds and intact distal pulses.  Exam reveals no gallop.   No murmur heard. Pulmonary/Chest: Effort normal and breath sounds normal. No respiratory distress. He has no wheezes. He has no rales.  Musculoskeletal: He exhibits no edema and no tenderness.  Lymphadenopathy:    He has no cervical adenopathy.  Skin:  No foot lesions  Psychiatric: He has a normal mood and affect. His behavior is normal.          Assessment & Plan:

## 2013-08-22 NOTE — Assessment & Plan Note (Signed)
Uses the hydrocodone regularly 

## 2013-08-22 NOTE — Assessment & Plan Note (Signed)
Gets DOE at times ?anginal equivalent Didn't tolerate carvedilol Still works hard as Visual merchandiser

## 2013-08-22 NOTE — Assessment & Plan Note (Signed)
Still not great lifestyle compliance Seems better though Did increase metformin Will recheck

## 2013-08-22 NOTE — Assessment & Plan Note (Signed)
Intolerant of multiple statins 

## 2013-08-23 ENCOUNTER — Encounter: Payer: Self-pay | Admitting: *Deleted

## 2013-08-24 ENCOUNTER — Ambulatory Visit: Payer: BC Managed Care – PPO | Admitting: Internal Medicine

## 2013-09-06 ENCOUNTER — Encounter: Payer: Self-pay | Admitting: Internal Medicine

## 2013-09-14 ENCOUNTER — Other Ambulatory Visit: Payer: Self-pay | Admitting: Internal Medicine

## 2013-09-15 NOTE — Telephone Encounter (Signed)
Okay #[phone] x 0 Someone else will have to write it

## 2013-09-17 NOTE — Telephone Encounter (Signed)
If I print this can you sign?

## 2013-09-18 NOTE — Telephone Encounter (Signed)
Left message on cell phone that rx is ready for pick-up, and it will be at our front desk.

## 2013-09-18 NOTE — Telephone Encounter (Signed)
Printed, I'll sign it.

## 2013-10-17 ENCOUNTER — Other Ambulatory Visit: Payer: Self-pay | Admitting: Family Medicine

## 2013-10-17 NOTE — Telephone Encounter (Signed)
Last office visit 08/22/13. Is it okay to refill medication?

## 2013-10-18 NOTE — Telephone Encounter (Signed)
Spoke with patient and advised rx ready for pick-up and it will be at the front desk.  

## 2013-11-09 ENCOUNTER — Ambulatory Visit (INDEPENDENT_AMBULATORY_CARE_PROVIDER_SITE_OTHER): Payer: BC Managed Care – PPO | Admitting: Cardiovascular Disease

## 2013-11-09 ENCOUNTER — Encounter: Payer: Self-pay | Admitting: Cardiovascular Disease

## 2013-11-09 VITALS — BP 124/68 | HR 75 | Ht 65.0 in | Wt 223.5 lb

## 2013-11-09 DIAGNOSIS — I2510 Atherosclerotic heart disease of native coronary artery without angina pectoris: Secondary | ICD-10-CM

## 2013-11-09 DIAGNOSIS — R0602 Shortness of breath: Secondary | ICD-10-CM

## 2013-11-09 DIAGNOSIS — E785 Hyperlipidemia, unspecified: Secondary | ICD-10-CM

## 2013-11-09 DIAGNOSIS — I10 Essential (primary) hypertension: Secondary | ICD-10-CM

## 2013-11-09 DIAGNOSIS — IMO0001 Reserved for inherently not codable concepts without codable children: Secondary | ICD-10-CM

## 2013-11-09 DIAGNOSIS — G4733 Obstructive sleep apnea (adult) (pediatric): Secondary | ICD-10-CM

## 2013-11-09 NOTE — Assessment & Plan Note (Signed)
Unable to tolerate statins. Suggested high fiber diet, oatmeal, red yeast rice. Also encouraged him to continue weight loss for improved diabetes control

## 2013-11-09 NOTE — Assessment & Plan Note (Signed)
We have encouraged continued exercise, careful diet management in an effort to lose weight. 

## 2013-11-09 NOTE — Assessment & Plan Note (Signed)
Blood pressure is well controlled on today's visit. No changes made to the medications. 

## 2013-11-09 NOTE — Progress Notes (Signed)
Patient ID: [name] [name], male    DOB: 12/30/1950, 62 y.o.   MRN: [id number]  HPI Comments: 62 year old gentleman with obesity, hyperlipidemia, diabetes, hypertension, CAD, s/p CABG. patient of Dr. Alphonsus Sias, who initially presented in November for chest pain. Cardiac catheterization showed severe LAD disease and he had bypass surgery November 12 2011. (Coronary artery bypass grafting x3 with the left internal mammary to the left anterior descending coronary artery, reversed saphenous vein graft sequentially to the first and third diagonals with right thigh and the vein harvesting.) He presents for routine followup. He has tried most of the statins before with myalgias and joint pain  In followup today, his biggest complaint is bilateral knee pain. 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 After a day in his boots, knees are very sore.  In the past he was unable to tolerate metoprolol secondary to memory problems and mood disorder. Unable to tolerate carvedilol secondary to fatigue Hemoglobin A1c improved from 8.9 down to 7.9 Mild shortness of breath with exertion, stable. Reports it has not changed over the past year. No significant chest pain or squeezing as he had in the past.  Previous  Echocardiogram done recently for his shortness of breath showed normal function, no significant pulmonary hypertension. Normal right ventricular systolic pressure.  EKG shows normal sinus rhythm with rate 75 beats per minute, no significant ST or T wave changes    Outpatient Encounter Prescriptions as of 11/09/2013  Medication Sig  . aspirin 81 MG tablet Take 2 tablets (162 mg total) by mouth daily.  . furosemide (LASIX) 40 MG tablet Take 40 mg by mouth 2 (two) times daily as needed.   Marland Kitchen glucose blood (FREESTYLE LITE) test strip Use as instructed, patient tests once daily. Dx: 250.00  . HYDROcodone-acetaminophen (NORCO/VICODIN) 5-325 MG per tablet Take 1 tablet by mouth 2 (two)  times daily as needed for moderate pain.  Marland Kitchen lisinopril (PRINIVIL,ZESTRIL) 10 MG tablet TAKE 1 TABLET DAILY  . metFORMIN (GLUCOPHAGE) 1000 MG tablet Take 1 tablet (1,000 mg total) by mouth 2 (two) times daily with a meal.  . nitroGLYCERIN (NITROSTAT) 0.4 MG SL tablet Place 1 tablet (0.4 mg total) under the tongue every 5 (five) minutes as needed for chest pain.  . pioglitazone (ACTOS) 30 MG tablet Take 1 tablet (30 mg total) by mouth daily.  . potassium chloride SA (K-DUR,KLOR-CON) 20 MEQ tablet Take 20 mEq by mouth 2 (two) times daily as needed.    Review of Systems  Constitutional: Negative.   HENT: Negative.   Eyes: Negative.   Respiratory: Positive for shortness of breath.   Cardiovascular: Negative.   Gastrointestinal: Negative.   Musculoskeletal: Positive for arthralgias.  Skin: Negative.   Neurological: Negative.   Psychiatric/Behavioral: Negative.   All other systems reviewed and are negative.   BP 124/68  Pulse 75  Ht 5\' 5"  (1.651 m)  Wt 223 lb 8 oz (101.379 kg)  BMI 37.19 kg/m2  Physical Exam  Nursing note and vitals reviewed. Constitutional: He is oriented to person, place, and time. He appears well-developed and well-nourished.  obese  HENT:  Head: Normocephalic.  Nose: Nose normal.  Mouth/Throat: Oropharynx is clear and moist.  Eyes: Conjunctivae are normal. Pupils are equal, round, and reactive to light.  Neck: Normal range of motion. Neck supple. No JVD present.  Cardiovascular: Normal rate, regular rhythm, S1 normal, S2 normal, normal heart sounds and intact distal pulses.  Exam reveals no gallop and  no friction rub.   No murmur heard. Well-healed midline scar in the sternum  Pulmonary/Chest: Effort normal and breath sounds normal. No respiratory distress. He has no decreased breath sounds. He has no wheezes. He has no rales. He exhibits no tenderness.  Abdominal: Soft. Bowel sounds are normal. He exhibits no distension. There is no tenderness.   Musculoskeletal: Normal range of motion. He exhibits no edema and no tenderness.  Lymphadenopathy:    He has no cervical adenopathy.  Neurological: He is alert and oriented to person, place, and time. Coordination normal.  Skin: Skin is warm and dry. No rash noted. No erythema.  Psychiatric: He has a normal mood and affect. His behavior is normal. Judgment and thought content normal.      Assessment and Plan        Goal will

## 2013-11-09 NOTE — Assessment & Plan Note (Signed)
Encouraged him to continue on his CPAP

## 2013-11-09 NOTE — Patient Instructions (Signed)
You are doing well. No medication changes were made.  Please call us if you have new issues that need to be addressed before your next appt.  Your physician wants you to follow-up in: 6 months.  You will receive a reminder letter in the mail two months in advance. If you don't receive a letter, please call our office to schedule the follow-up appointment.   

## 2013-11-09 NOTE — Assessment & Plan Note (Signed)
Chronic mild shortness of breath, suspect secondary to weight and deconditioning. No progression in his symptoms since bypass surgery.

## 2013-11-09 NOTE — Assessment & Plan Note (Signed)
Currently with no symptoms of angina. No further workup at this time. Continue current medication regimen. 

## 2013-11-14 ENCOUNTER — Encounter: Payer: Self-pay | Admitting: Pulmonary Disease

## 2013-11-14 ENCOUNTER — Ambulatory Visit (INDEPENDENT_AMBULATORY_CARE_PROVIDER_SITE_OTHER): Payer: BC Managed Care – PPO | Admitting: Pulmonary Disease

## 2013-11-14 VITALS — BP 122/80 | HR 67 | Temp 97.7°F | Ht 65.0 in | Wt 228.2 lb

## 2013-11-14 DIAGNOSIS — G4733 Obstructive sleep apnea (adult) (pediatric): Secondary | ICD-10-CM

## 2013-11-14 NOTE — Assessment & Plan Note (Signed)
The patient is doing very well with CPAP from a response standpoint, but would rather his pressure be a little lower if possible. I think he would benefit from changing over to the automatic mode, and we'll send an order to his home care company for this. I've also encouraged him to work aggressively on weight loss.

## 2013-11-14 NOTE — Progress Notes (Signed)
   Subjective:    Patient ID: [name] [name], male    DOB: Feb 11, 1951, 62 y.o.   MRN: [id number]  HPI The patient comes in today for followup of his obstructive sleep apnea. He is wearing CPAP compliantly, and feels that overall he is sleeping well with the device. He is having no issues with his mask, but feels pressure is a little high at times. He is satisfied with his sleep and his daytime alertness.   Review of Systems  Constitutional: Negative for fever and unexpected weight change.  HENT: Positive for congestion, ear pain, postnasal drip and rhinorrhea. Negative for dental problem, nosebleeds, sinus pressure, sneezing, sore throat and trouble swallowing.   Eyes: Negative for redness and itching.  Respiratory: Positive for shortness of breath. Negative for cough, chest tightness and wheezing.   Cardiovascular: Negative for palpitations and leg swelling.  Gastrointestinal: Negative for nausea and vomiting.  Genitourinary: Negative for dysuria.  Musculoskeletal: Negative for joint swelling.  Skin: Negative for rash.  Neurological: Negative for headaches.  Hematological: Does not bruise/bleed easily.  Psychiatric/Behavioral: Negative for dysphoric mood. The patient is not nervous/anxious.        Objective:   Physical Exam Obese male in no acute distress No skin breakdown or pressure necrosis from the CPAP Nose without purulence or discharge noted Neck without lymphadenopathy or thyromegaly Lower extremities with minimal edema, no cyanosis Alert and oriented, does not appear to be sleepy, moves all 4 extremities.       Assessment & Plan:

## 2013-11-14 NOTE — Patient Instructions (Signed)
Will change your machine over to the auto setting to see if more comfortable for you.  Let us know. Work on weight loss followup with me in one year if doing well.

## 2013-11-24 ENCOUNTER — Other Ambulatory Visit: Payer: Self-pay | Admitting: Internal Medicine

## 2013-11-26 NOTE — Telephone Encounter (Signed)
Left message on machine that rx is ready for pick-up, and it will be at our front desk.  

## 2013-11-26 NOTE — Telephone Encounter (Signed)
Last filled 10/17/13 

## 2013-12-04 ENCOUNTER — Other Ambulatory Visit: Payer: Self-pay | Admitting: Internal Medicine

## 2013-12-07 LAB — HM DIABETES EYE EXAM

## 2013-12-31 ENCOUNTER — Other Ambulatory Visit: Payer: Self-pay | Admitting: Internal Medicine

## 2014-01-01 NOTE — Telephone Encounter (Signed)
Left message on machine that rx is ready for pick-up, and it will be at our front desk.  

## 2014-01-01 NOTE — Telephone Encounter (Signed)
11/24/13 

## 2014-02-13 ENCOUNTER — Other Ambulatory Visit: Payer: Self-pay | Admitting: Internal Medicine

## 2014-02-13 NOTE — Telephone Encounter (Signed)
Dr. Silvio Pate out of office until 3/16.  Ok to refill?

## 2014-02-14 MED ORDER — HYDROCODONE-ACETAMINOPHEN 5-325 MG PO TABS: ORAL_TABLET | ORAL | Status: DC

## 2014-02-14 NOTE — Addendum Note (Signed)
Addended by: Carter Kitten on: 02/14/2014 11:32 AM   Modules accepted: Orders

## 2014-02-14 NOTE — Telephone Encounter (Signed)
[name] [name] notified prescription is ready to be picked up at front desk.

## 2014-02-26 ENCOUNTER — Encounter: Payer: Self-pay | Admitting: Internal Medicine

## 2014-02-26 ENCOUNTER — Ambulatory Visit (INDEPENDENT_AMBULATORY_CARE_PROVIDER_SITE_OTHER): Payer: BC Managed Care – PPO | Admitting: Internal Medicine

## 2014-02-26 VITALS — BP 122/76 | HR 77 | Temp 97.1°F | Wt 221.8 lb

## 2014-02-26 DIAGNOSIS — E785 Hyperlipidemia, unspecified: Secondary | ICD-10-CM

## 2014-02-26 DIAGNOSIS — I10 Essential (primary) hypertension: Secondary | ICD-10-CM

## 2014-02-26 DIAGNOSIS — M179 Osteoarthritis of knee, unspecified: Secondary | ICD-10-CM

## 2014-02-26 DIAGNOSIS — I2510 Atherosclerotic heart disease of native coronary artery without angina pectoris: Secondary | ICD-10-CM

## 2014-02-26 DIAGNOSIS — IMO0001 Reserved for inherently not codable concepts without codable children: Secondary | ICD-10-CM

## 2014-02-26 DIAGNOSIS — IMO0002 Reserved for concepts with insufficient information to code with codable children: Secondary | ICD-10-CM

## 2014-02-26 DIAGNOSIS — M1710 Unilateral primary osteoarthritis, unspecified knee: Secondary | ICD-10-CM

## 2014-02-26 DIAGNOSIS — Z0000 Encounter for general adult medical examination without abnormal findings: Secondary | ICD-10-CM

## 2014-02-26 DIAGNOSIS — E1165 Type 2 diabetes mellitus with hyperglycemia: Secondary | ICD-10-CM

## 2014-02-26 LAB — COMPREHENSIVE METABOLIC PANEL
ALK PHOS: 82 U/L (ref 39–117)
ALT: 20 U/L (ref 0–53)
AST: 20 U/L (ref 0–37)
Albumin: 4.3 g/dL (ref 3.5–5.2)
BILIRUBIN TOTAL: 0.6 mg/dL (ref 0.3–1.2)
BUN: 15 mg/dL (ref 6–23)
CO2: 30 meq/L (ref 19–32)
CREATININE: 1 mg/dL (ref 0.4–1.5)
Calcium: 9.7 mg/dL (ref 8.4–10.5)
Chloride: 99 mEq/L (ref 96–112)
GFR: 79.2 mL/min (ref >60.00)
GLUCOSE: 172 mg/dL — AB (ref 70–99)
Potassium: 4.3 mEq/L (ref 3.5–5.1)
Sodium: 137 mEq/L (ref 135–145)
TOTAL PROTEIN: 7.3 g/dL (ref 6.0–8.3)

## 2014-02-26 LAB — T4, FREE: FREE T4: 0.91 ng/dL (ref 0.60–1.60)

## 2014-02-26 LAB — CBC WITH DIFFERENTIAL/PLATELET
BASOS PCT: 1.8 % (ref 0.0–3.0)
Basophils Absolute: 0.1 10*3/uL (ref 0.0–0.1)
EOS PCT: 2.5 % (ref 0.0–5.0)
Eosinophils Absolute: 0.2 10*3/uL (ref 0.0–0.7)
HEMATOCRIT: 46.3 % (ref 39.0–52.0)
Hemoglobin: 15.4 g/dL (ref 13.0–17.0)
LYMPHS ABS: 1.8 10*3/uL (ref 0.7–4.0)
Lymphocytes Relative: 27.8 % (ref 12.0–46.0)
MCHC: 33.3 g/dL (ref 30.0–36.0)
MCV: 89.8 fl (ref 78.0–100.0)
MONO ABS: 0.6 10*3/uL (ref 0.1–1.0)
MONOS PCT: 9.4 % (ref 3.0–12.0)
Neutro Abs: 3.9 10*3/uL (ref 1.4–7.7)
Neutrophils Relative %: 58.5 % (ref 43.0–77.0)
PLATELETS: 244 10*3/uL (ref 150.0–400.0)
RBC: 5.15 Mil/uL (ref 4.22–5.81)
RDW: 13.6 % (ref 11.5–14.6)
WBC: 6.6 10*3/uL (ref 4.5–10.5)

## 2014-02-26 LAB — LIPID PANEL
CHOLESTEROL: 226 mg/dL — AB (ref 0–200)
HDL: 50.6 mg/dL (ref >39.00)
LDL CALC: 144 mg/dL — AB (ref 0–99)
Total CHOL/HDL Ratio: 4
Triglycerides: 156 mg/dL — ABNORMAL HIGH (ref 0.0–149.0)
VLDL: 31.2 mg/dL (ref 0.0–40.0)

## 2014-02-26 LAB — HEMOGLOBIN A1C: HEMOGLOBIN A1C: 7.7 % — AB (ref 4.6–6.5)

## 2014-02-26 LAB — TSH: TSH: 1.44 u[IU]/mL (ref 0.35–5.50)

## 2014-02-26 LAB — HM DIABETES FOOT EXAM

## 2014-02-26 MED ORDER — TRIAMCINOLONE ACETONIDE 0.1 % EX CREA: 1.0000 "application " | TOPICAL_CREAM | Freq: Two times a day (BID) | CUTANEOUS | Status: DC | PRN

## 2014-02-26 MED ORDER — ZOSTER VACCINE LIVE 19400 UNT/0.65ML ~~LOC~~ SOLR: 0.6500 mL | Freq: Once | SUBCUTANEOUS | Status: DC

## 2014-02-26 MED ORDER — SILDENAFIL CITRATE 20 MG PO TABS: 60.0000 mg | ORAL_TABLET | Freq: Every day | ORAL | Status: DC | PRN

## 2014-02-26 NOTE — Assessment & Plan Note (Signed)
BP Readings from Last 3 Encounters:  02/26/14 122/76  11/14/13 122/80  11/09/13 124/68   Good control

## 2014-02-26 NOTE — Assessment & Plan Note (Signed)
Some relief from narcotics

## 2014-02-26 NOTE — Assessment & Plan Note (Signed)
Recommended repeating colonoscopy ---he wishes to hold off Defer PSA to next year

## 2014-02-26 NOTE — Assessment & Plan Note (Signed)
Did get down to 7.9% Discussed endocrine--he doesn't want referral Gave diet info

## 2014-02-26 NOTE — Assessment & Plan Note (Signed)
Gets some vague chest pain---seems like it may be related to his shoulders

## 2014-02-26 NOTE — Assessment & Plan Note (Signed)
Intolerant of multiple statins 

## 2014-02-26 NOTE — Progress Notes (Signed)
Subjective:    Patient ID: [name] [name], male    DOB: [birth year], 63 y.o.   MRN: [id number]  HPI Here for physical Rx for zostavax Last colonoscopy ~7 years ago. He is not in rush  Chronic runny nose and sneezing Goes back at least 6 months Uses OTC med with success (sounds like short acting med)--thinks it is phenylephrine Discussed alternatives Allergy pills didn't help  Checks sugars once a month or so ~140-150 fasting No sores, pain orr numbness in feet Recent eye exam--no retinopathy  Constant pain in knees, shoulders, hands Affects him all the time Just gets mild relief from the narcotics Continues to work hard on farm  Has noticed some pressure in chest again Not sure if it is from his shoulders No SOB Doesn't have to stop when working  Current Outpatient Prescriptions on File Prior to Visit  Medication Sig Dispense Refill  . aspirin 81 MG tablet Take 2 tablets (162 mg total) by mouth daily.      . furosemide (LASIX) 40 MG tablet Take 40 mg by mouth 2 (two) times daily as needed.       Marland Kitchen glucose blood (FREESTYLE LITE) test strip Use as instructed, patient tests once daily. Dx: 250.00  300 each  3  . HYDROcodone-acetaminophen (NORCO/VICODIN) 5-325 MG per tablet TAKE ONE TABLET BY MOUTH TWICE DAILY AS NEEDED FOR MODERATE PAIN  60 tablet  0  . lisinopril (PRINIVIL,ZESTRIL) 10 MG tablet TAKE 1 TABLET DAILY  90 tablet  3  . metFORMIN (GLUCOPHAGE) 1000 MG tablet Take 1 tablet (1,000 mg total) by mouth 2 (two) times daily with a meal.  180 tablet  3  . nitroGLYCERIN (NITROSTAT) 0.4 MG SL tablet Place 1 tablet (0.4 mg total) under the tongue every 5 (five) minutes as needed for chest pain.  25 tablet  3  . pioglitazone (ACTOS) 30 MG tablet TAKE 1 TABLET DAILY  90 tablet  2  . potassium chloride SA (K-DUR,KLOR-CON) 20 MEQ tablet Take 20 mEq by mouth 2 (two) times daily as needed.       No current facility-administered medications on file prior to visit.     Allergies  Allergen Reactions  . Crestor [Rosuvastatin]     Muscle aches.  . Doxazosin Mesylate     REACTION: didn't work and may have caused SOB  . Doxycycline     REACTION: unspecified  . Glimepiride     REACTION: red eyes  . Glipizide     REACTION: myalgia??  . Losartan Potassium-Hctz     REACTION: chest \\T \ leg pain  . Metoprolol Tartrate     Mood swings, memory changes  . Pravastatin     Muscle aches   . Rosiglitazone Maleate     REACTION: myalgia  . Simvastatin     REACTION: myalgias  . Carvedilol     Felt bad    Past Medical History  Diagnosis Date  . BPH (benign prostatic hypertrophy)   . Hx of colonic polyps   . Hyperlipidemia   . Hypertension   . Cerebral aneurysm   . Carpal tunnel syndrome   . Arthritis   . Kidney stones   . Angina   . Shortness of breath   . OSA (obstructive sleep apnea)     uses cpap  . GERD (gastroesophageal reflux disease)   . Diabetes mellitus     type 2  . Coronary artery disease 11/12/11    s/p CABG x 3  Past Surgical History  Procedure Laterality Date  . Knee surgery      left  . Meniscus repair  03/08    right knee  . Hernia repair    . Coronary artery bypass graft  11/12/2011    Procedure: CORONARY ARTERY BYPASS GRAFTING (CABG);  Surgeon: Grace Isaac, MD;  Location: [facility];  Service: Open Heart Surgery;  Laterality: N/A;  Coronary Artery Bypass Graft times three on pump utilizing left internal mammary artery and right saphenous vein harvested endoscopically     Family History  Problem Relation Age of Onset  . Coronary artery disease Father   . Diabetes Neg Hx   . Hypertension Neg Hx   . Prostate cancer Father     History   Social History  . Marital Status: Married    Spouse Name: N/A    Number of Children: 3  . Years of Education: N/A   Occupational History  . tool maker    Social History Main Topics  . Smoking status: Never Smoker   . Smokeless tobacco: Never Used  . Alcohol Use: No  .  Drug Use: No  . Sexual Activity: Yes   Other Topics Concern  . Not on file   Social History Narrative  . No narrative on file   Review of Systems  Constitutional: Positive for unexpected weight change.       Weight down 7# Inconsistent with seat belt--improving  HENT: Positive for congestion, dental problem and rhinorrhea. Negative for hearing loss and tinnitus.        Teeth rotted, sore gums. Does see the dentist (mostly for extractions)  Eyes: Negative for visual disturbance.  Respiratory: Positive for chest tightness. Negative for cough and shortness of breath.   Cardiovascular: Positive for chest pain and palpitations. Negative for leg swelling.       Slight flutter in chest  Gastrointestinal: Positive for blood in stool. Negative for nausea, vomiting, abdominal pain and constipation.       Had blood in stool a few months ago---went away when he went off cold meds  Endocrine: Positive for heat intolerance. Negative for cold intolerance.  Genitourinary: Positive for difficulty urinating. Negative for urgency and frequency.       Slight urgency Some ED--wonders about meds  Musculoskeletal: Positive for arthralgias and back pain. Negative for joint swelling.  Skin:       Has non healing place behind left ear--tried T gel without success Spot on right cheek--slightly sore  Allergic/Immunologic: Negative for immunocompromised state.  Neurological: Negative for dizziness, syncope, weakness, light-headedness, numbness and headaches.  Psychiatric/Behavioral: Negative for sleep disturbance and dysphoric mood. The patient is not nervous/anxious.        Objective:   Physical Exam  Constitutional: He is oriented to person, place, and time. He appears well-developed and well-nourished. No distress.  HENT:  Head: Normocephalic and atraumatic.  Right Ear: External ear normal.  Left Ear: External ear normal.  Mouth/Throat: Oropharynx is clear and moist. No oropharyngeal exudate.  Eyes:  Conjunctivae and EOM are normal. Pupils are equal, round, and reactive to light.  Neck: Normal range of motion. Neck supple. No thyromegaly present.  Cardiovascular: Normal rate, regular rhythm, normal heart sounds and intact distal pulses.  Exam reveals no gallop.   No murmur heard. Pulmonary/Chest: Effort normal and breath sounds normal. No respiratory distress. He has no wheezes. He has no rales.  Abdominal: Soft. There is no tenderness.  Musculoskeletal: He exhibits no edema and no  tenderness.  Lymphadenopathy:    He has no cervical adenopathy.  Neurological: He is alert and oriented to person, place, and time.  Normal sensation in feet  Skin: No erythema.  Seborrhea behind knees  Psychiatric: He has a normal mood and affect. His behavior is normal.          Assessment & Plan:

## 2014-02-26 NOTE — Patient Instructions (Signed)
Please try loratadine 10mg  1-2 daily or cetirizine 10mg  daily to see if they help the runny nose.  Diabetes Meal Planning Guide The diabetes meal planning guide is a tool to help you plan your meals and snacks. It is important for people with diabetes to manage their blood glucose (sugar) levels. Choosing the right foods and the right amounts throughout your day will help control your blood glucose. Eating right can even help you improve your blood pressure and reach or maintain a healthy weight. CARBOHYDRATE COUNTING MADE EASY When you eat carbohydrates, they turn to sugar. This raises your blood glucose level. Counting carbohydrates can help you control this level so you feel better. When you plan your meals by counting carbohydrates, you can have more flexibility in what you eat and balance your medicine with your food intake. Carbohydrate counting simply means adding up the total amount of carbohydrate grams in your meals and snacks. Try to eat about the same amount at each meal. Foods with carbohydrates are listed below. Each portion below is 1 carbohydrate serving or 15 grams of carbohydrates. Ask your dietician how many grams of carbohydrates you should eat at each meal or snack. Grains and Starches  1 slice bread.   English muffin or hotdog/hamburger bun.   cup cold cereal (unsweetened).   cup cooked pasta or rice.   cup starchy vegetables (corn, potatoes, peas, beans, winter squash).  1 tortilla (6 inches).   bagel.  1 waffle or pancake (size of a CD).   cup cooked cereal.  4 to 6 small crackers. *Whole grain is recommended. Fruit  1 cup fresh unsweetened berries, melon, papaya, pineapple.  1 small fresh fruit.   banana or mango.   cup fruit juice (4 oz unsweetened).   cup canned fruit in natural juice or water.  2 tbs dried fruit.  12 to 15 grapes or cherries. Milk and Yogurt  1 cup fat-free or 1% milk.  1 cup soy milk.  6 oz light yogurt with  sugar-free sweetener.  6 oz low-fat soy yogurt.  6 oz plain yogurt. Vegetables  1 cup raw or  cup cooked is counted as 0 carbohydrates or a "free" food.  If you eat 3 or more servings at 1 meal, count them as 1 carbohydrate serving. Other Carbohydrates   oz chips or pretzels.   cup ice cream or frozen yogurt.   cup sherbet or sorbet.  2 inch square cake, no frosting.  1 tbs honey, sugar, jam, jelly, or syrup.  2 small cookies.  3 squares of graham crackers.  3 cups popcorn.  6 crackers.  1 cup broth-based soup.  Count 1 cup casserole or other mixed foods as 2 carbohydrate servings.  Foods with less than 20 calories in a serving may be counted as 0 carbohydrates or a "free" food. You may want to purchase a book or computer software that lists the carbohydrate gram counts of different foods. In addition, the nutrition facts panel on the labels of the foods you eat are a good source of this information. The label will tell you how big the serving size is and the total number of carbohydrate grams you will be eating per serving. Divide this number by 15 to obtain the number of carbohydrate servings in a portion. Remember, 1 carbohydrate serving equals 15 grams of carbohydrate. SERVING SIZES Measuring foods and serving sizes helps you make sure you are getting the right amount of food. The list below tells how big or  small some common serving sizes are.  1 oz.........4 stacked dice.  3 oz........Marland KitchenDeck of cards.  1 tsp.......Marland KitchenTip of little finger.  1 tbs......Marland KitchenMarland KitchenThumb.  2 tbs.......Marland KitchenGolf ball.   cup......Marland KitchenHalf of a fist.  1 cup.......Marland KitchenA fist. SAMPLE DIABETES MEAL PLAN Below is a sample meal plan that includes foods from the grain and starches, dairy, vegetable, fruit, and meat groups. A dietician can individualize a meal plan to fit your calorie needs and tell you the number of servings needed from each food group. However, controlling the total amount of  carbohydrates in your meal or snack is more important than making sure you include all of the food groups at every meal. You may interchange carbohydrate containing foods (dairy, starches, and fruits). The meal plan below is an example of a 2000 calorie diet using carbohydrate counting. This meal plan has 17 carbohydrate servings. Breakfast  1 cup oatmeal (2 carb servings).   cup light yogurt (1 carb serving).  1 cup blueberries (1 carb serving).   cup almonds. Snack  1 large apple (2 carb servings).  1 low-fat string cheese stick. Lunch  Chicken breast salad.  1 cup spinach.   cup chopped tomatoes.  2 oz chicken breast, sliced.  2 tbs low-fat [place] dressing.  12 whole-wheat crackers (2 carb servings).  12 to 15 grapes (1 carb serving).  1 cup low-fat milk (1 carb serving). Snack  1 cup carrots.   cup hummus (1 carb serving). Dinner  3 oz broiled salmon.  1 cup brown rice (3 carb servings). Snack  1  cups steamed broccoli (1 carb serving) drizzled with 1 tsp olive oil and lemon juice.  1 cup light pudding (2 carb servings). DIABETES MEAL PLANNING WORKSHEET Your dietician can use this worksheet to help you decide how many servings of foods and what types of foods are right for you.  BREAKFAST Food Group and Servings / Carb Servings Grain/Starches __________________________________ Dairy __________________________________________ Vegetable ______________________________________ Fruit ___________________________________________ Meat __________________________________________ Fat ____________________________________________ LUNCH Food Group and Servings / Carb Servings Grain/Starches ___________________________________ Dairy ___________________________________________ Fruit ____________________________________________ Meat ___________________________________________ Fat _____________________________________________ [name] [name] Food Group and Servings /  Carb Servings Grain/Starches ___________________________________ Dairy ___________________________________________ Fruit ____________________________________________ Meat ___________________________________________ Fat _____________________________________________ SNACKS Food Group and Servings / Carb Servings Grain/Starches ___________________________________ Dairy ___________________________________________ Vegetable _______________________________________ Fruit ____________________________________________ Meat ___________________________________________ Fat _____________________________________________ DAILY TOTALS Starches _________________________ Vegetable ________________________ Fruit ____________________________ Dairy ____________________________ Meat ____________________________ Fat ______________________________ Document Released: 08/19/2005 Document Revised: 02/14/2012 Document Reviewed: 06/30/2009 [employer] Patient Information 2014 [employer].

## 2014-02-26 NOTE — Progress Notes (Signed)
Pre visit review using our clinic review tool, if applicable. No additional management support is needed unless otherwise documented below in the visit note. 

## 2014-02-27 ENCOUNTER — Telehealth: Payer: Self-pay | Admitting: Internal Medicine

## 2014-02-27 NOTE — Telephone Encounter (Signed)
Pt dropped off form to be filled out for work. If you could please fax. Form given to [facility] to pass along to Dr Silvio Pate. Thank you

## 2014-02-27 NOTE — Telephone Encounter (Signed)
On your desk

## 2014-02-27 NOTE — Telephone Encounter (Signed)
Relevant patient education mailed to patient.  

## 2014-02-28 ENCOUNTER — Encounter: Payer: Self-pay | Admitting: *Deleted

## 2014-02-28 NOTE — Telephone Encounter (Signed)
Form signed.

## 2014-02-28 NOTE — Telephone Encounter (Signed)
Form faxed and scanned.

## 2014-03-07 ENCOUNTER — Telehealth: Payer: Self-pay

## 2014-03-07 NOTE — Telephone Encounter (Signed)
Relevant patient education mailed to patient.  

## 2014-03-18 ENCOUNTER — Other Ambulatory Visit: Payer: Self-pay | Admitting: Family Medicine

## 2014-03-19 NOTE — Telephone Encounter (Signed)
Last office visit 02/26/2014.  Last refilled 02/14/2014 for #[id number].  Ok to refill?

## 2014-03-19 NOTE — Telephone Encounter (Signed)
Left message on machine that rx is ready for pick-up, and it will be at our front desk.  

## 2014-04-19 ENCOUNTER — Other Ambulatory Visit: Payer: Self-pay | Admitting: Internal Medicine

## 2014-04-19 NOTE — Telephone Encounter (Signed)
Left message on machine that rx is ready for pick-up, and it will be at our front desk.  

## 2014-04-19 NOTE — Telephone Encounter (Signed)
03/19/14 

## 2014-05-02 ENCOUNTER — Other Ambulatory Visit: Payer: Self-pay | Admitting: Cardiovascular Disease

## 2014-05-19 ENCOUNTER — Other Ambulatory Visit: Payer: Self-pay | Admitting: Internal Medicine

## 2014-05-20 NOTE — Telephone Encounter (Signed)
RX printed and signed 

## 2014-05-20 NOTE — Telephone Encounter (Signed)
Left detailed msg on VM per HIPAA letting pt know Rx is ready for pick up

## 2014-05-20 NOTE — Telephone Encounter (Signed)
Last filled 04/19/14 #60

## 2014-05-24 ENCOUNTER — Ambulatory Visit: Payer: BC Managed Care – PPO | Admitting: Cardiovascular Disease

## 2014-06-12 ENCOUNTER — Ambulatory Visit: Payer: BC Managed Care – PPO | Admitting: Cardiovascular Disease

## 2014-06-13 ENCOUNTER — Encounter: Payer: Self-pay | Admitting: Cardiovascular Disease

## 2014-06-13 ENCOUNTER — Ambulatory Visit (INDEPENDENT_AMBULATORY_CARE_PROVIDER_SITE_OTHER): Payer: BC Managed Care – PPO | Admitting: Cardiovascular Disease

## 2014-06-13 VITALS — BP 110/78 | HR 67 | Ht 65.0 in | Wt 228.0 lb

## 2014-06-13 DIAGNOSIS — I2510 Atherosclerotic heart disease of native coronary artery without angina pectoris: Secondary | ICD-10-CM

## 2014-06-13 DIAGNOSIS — Z951 Presence of aortocoronary bypass graft: Secondary | ICD-10-CM

## 2014-06-13 DIAGNOSIS — E1165 Type 2 diabetes mellitus with hyperglycemia: Secondary | ICD-10-CM

## 2014-06-13 DIAGNOSIS — I10 Essential (primary) hypertension: Secondary | ICD-10-CM

## 2014-06-13 DIAGNOSIS — M174 Other bilateral secondary osteoarthritis of knee: Secondary | ICD-10-CM

## 2014-06-13 DIAGNOSIS — R0602 Shortness of breath: Secondary | ICD-10-CM

## 2014-06-13 DIAGNOSIS — IMO0001 Reserved for inherently not codable concepts without codable children: Secondary | ICD-10-CM

## 2014-06-13 DIAGNOSIS — M175 Other unilateral secondary osteoarthritis of knee: Secondary | ICD-10-CM

## 2014-06-13 DIAGNOSIS — E785 Hyperlipidemia, unspecified: Secondary | ICD-10-CM

## 2014-06-13 MED ORDER — EZETIMIBE 10 MG PO TABS: 10.0000 mg | ORAL_TABLET | Freq: Every day | ORAL | Status: DC

## 2014-06-13 NOTE — Assessment & Plan Note (Signed)
Currently with no symptoms of angina. No further workup at this time. Continue current medication regimen. 

## 2014-06-13 NOTE — Assessment & Plan Note (Signed)
Continues to have periodic severe knee pain. No significant relief with NSAIDs

## 2014-06-13 NOTE — Patient Instructions (Addendum)
You are doing well.  Try zetia one a day for cholesterol  Please call us if you have new issues that need to be addressed before your next appt.  Your physician wants you to follow-up in: 6 months.  You will receive a reminder letter in the mail two months in advance. If you don't receive a letter, please call our office to schedule the follow-up appointment.

## 2014-06-13 NOTE — Assessment & Plan Note (Signed)
Unable to tolerate statins. He is willing to try an alternate medication until better options are available. Samples of zetia were given. Other options include fenofibrate. Encouraged weight loss.

## 2014-06-13 NOTE — Progress Notes (Signed)
Patient ID: [name] [name], male    DOB: January 25, 1951, 63 y.o.   MRN: [id number]  HPI Comments: 63 year old gentleman with obesity, hyperlipidemia, diabetes, hypertension, CAD, s/p CABG. patient of Dr. Silvio Pate, who initially presented for chest pain. Cardiac catheterization showed severe LAD disease and he had bypass surgery November 12 2011. (Coronary artery bypass grafting x3 with the left internal mammary to the left anterior descending coronary artery, reversed saphenous vein graft sequentially to the first and third diagonals with right thigh and the vein harvesting.) He presents for routine followup. He has tried most of the statins before with myalgias and joint pain  In followup today, he reports that he is doing well overall. 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 He does not do regular exercise program. Previously had significant shortness of breath. This seems to have improved, now only mild. He does occasionally have severe bilateral knee pain. He does not know why he comes on acutely. NSAIDs did not seem to help very much On prior office visits, reported that After a day in his boots, knees are very sore.    unable to tolerate metoprolol secondary to memory problems and mood disorder. Unable to tolerate carvedilol secondary to fatigue Hemoglobin A1c improved from 8.9 down to 7.7   Previous  Echocardiogram done recently for his shortness of breath showed normal function, no significant pulmonary hypertension. Normal right ventricular systolic pressure.  EKG shows normal sinus rhythm with rate 67 beats per minute, no significant ST or T wave changes    Outpatient Encounter Prescriptions as of 06/13/2014  Medication Sig  . aspirin 81 MG tablet Take 2 tablets (162 mg total) by mouth daily.  . furosemide (LASIX) 40 MG tablet Take 40 mg by mouth 2 (two) times daily as needed.   .  furosemide (LASIX) 40 MG tablet TAKE 1 TABLET TWICE A DAY AS NEEDED  . glucose blood (FREESTYLE LITE) test strip Use as instructed, patient tests once daily. Dx: 250.00  . HYDROcodone-acetaminophen (NORCO/VICODIN) 5-325 MG per tablet TAKE 1 TABLET BY MOUTH TWICE A DAY AS NEEDED FOR PAIN.  Marland Kitchen lisinopril (PRINIVIL,ZESTRIL) 10 MG tablet TAKE 1 TABLET DAILY  . metFORMIN (GLUCOPHAGE) 1000 MG tablet Take 1 tablet (1,000 mg total) by mouth 2 (two) times daily with a meal.  . pioglitazone (ACTOS) 30 MG tablet TAKE 1 TABLET DAILY  . potassium chloride SA (K-DUR,KLOR-CON) 20 MEQ tablet Take 20 mEq by mouth 2 (two) times daily as needed.  . sildenafil (REVATIO) 20 MG tablet Take 3-5 tablets (60-100 mg total) by mouth daily as needed.  . triamcinolone cream (KENALOG) 0.1 % Apply 1 application topically 2 (two) times daily as needed.  . zoster vaccine live, PF, (ZOSTAVAX) 41660 UNT/0.65ML injection Inject 19,400 Units into the skin once.    Review of Systems  Constitutional: Negative.   HENT: Negative.   Eyes: Negative.   Respiratory: Positive for shortness of breath.   Cardiovascular: Negative.   Gastrointestinal: Negative.   Endocrine: Negative.   Musculoskeletal: Positive for arthralgias.  Skin: Negative.   Allergic/Immunologic: Negative.   Neurological: Negative.   Hematological: Negative.   Psychiatric/Behavioral: Negative.   All other systems reviewed and are negative.  BP 110/78  Pulse 67  Ht 5\' 5"  (1.651 m)  Wt 228 lb (103.42 kg)  BMI 37.94 kg/m2  Physical Exam  Nursing note and vitals reviewed. Constitutional: He is oriented  to person, place, and time. He appears well-developed and well-nourished.  obese  HENT:  Head: Normocephalic.  Nose: Nose normal.  Mouth/Throat: Oropharynx is clear and moist.  Eyes: Conjunctivae are normal. Pupils are equal, round, and reactive to light.  Neck: Normal range of motion. Neck supple. No JVD present.  Cardiovascular: Normal rate, regular  rhythm, S1 normal, S2 normal, normal heart sounds and intact distal pulses.  Exam reveals no gallop and no friction rub.   No murmur heard. Well-healed midline scar in the sternum  Pulmonary/Chest: Effort normal and breath sounds normal. No respiratory distress. He has no decreased breath sounds. He has no wheezes. He has no rales. He exhibits no tenderness.  Abdominal: Soft. Bowel sounds are normal. He exhibits no distension. There is no tenderness.  Musculoskeletal: Normal range of motion. He exhibits no edema and no tenderness.  Lymphadenopathy:    He has no cervical adenopathy.  Neurological: He is alert and oriented to person, place, and time. Coordination normal.  Skin: Skin is warm and dry. No rash noted. No erythema.  Psychiatric: He has a normal mood and affect. His behavior is normal. Judgment and thought content normal.      Assessment and Plan        Goal will

## 2014-06-13 NOTE — Assessment & Plan Note (Signed)
We have encouraged continued exercise, careful diet management in an effort to lose weight. 

## 2014-06-13 NOTE — Assessment & Plan Note (Signed)
Blood pressure is well controlled on today's visit. No changes made to the medications. 

## 2014-06-17 ENCOUNTER — Encounter: Payer: Self-pay | Admitting: Cardiovascular Disease

## 2014-06-19 ENCOUNTER — Other Ambulatory Visit: Payer: Self-pay | Admitting: Internal Medicine

## 2014-06-20 NOTE — Telephone Encounter (Signed)
Last filled by Regina Baity 05/20/14--please advise if okay to refill

## 2014-06-20 NOTE — Telephone Encounter (Signed)
Pt notified Rx ready for pickup 

## 2014-07-01 ENCOUNTER — Other Ambulatory Visit: Payer: Self-pay | Admitting: Cardiovascular Disease

## 2014-07-26 ENCOUNTER — Other Ambulatory Visit: Payer: Self-pay | Admitting: Internal Medicine

## 2014-07-26 NOTE — Telephone Encounter (Signed)
Spoke with patient's wife and advised rx ready for pick-up and it will be at the front desk.  

## 2014-07-26 NOTE — Telephone Encounter (Signed)
06/20/14 

## 2014-08-29 ENCOUNTER — Other Ambulatory Visit: Payer: Self-pay | Admitting: Internal Medicine

## 2014-08-29 NOTE — Telephone Encounter (Signed)
07/26/14 

## 2014-08-29 NOTE — Telephone Encounter (Signed)
Left message on machine that rx is ready for pick-up, and it will be at our front desk.  

## 2014-08-30 ENCOUNTER — Ambulatory Visit (INDEPENDENT_AMBULATORY_CARE_PROVIDER_SITE_OTHER): Payer: BC Managed Care – PPO | Admitting: Internal Medicine

## 2014-08-30 ENCOUNTER — Encounter: Payer: Self-pay | Admitting: Internal Medicine

## 2014-08-30 VITALS — BP 120/64 | HR 73 | Temp 98.4°F | Wt 229.0 lb

## 2014-08-30 DIAGNOSIS — IMO0001 Reserved for inherently not codable concepts without codable children: Secondary | ICD-10-CM

## 2014-08-30 DIAGNOSIS — I10 Essential (primary) hypertension: Secondary | ICD-10-CM

## 2014-08-30 DIAGNOSIS — I2510 Atherosclerotic heart disease of native coronary artery without angina pectoris: Secondary | ICD-10-CM

## 2014-08-30 DIAGNOSIS — R0602 Shortness of breath: Secondary | ICD-10-CM

## 2014-08-30 DIAGNOSIS — E1165 Type 2 diabetes mellitus with hyperglycemia: Secondary | ICD-10-CM

## 2014-08-30 LAB — HEMOGLOBIN A1C: Hgb A1c MFr Bld: 8 % — ABNORMAL HIGH (ref 4.6–6.5)

## 2014-08-30 MED ORDER — MONTELUKAST SODIUM 10 MG PO TABS: 10.0000 mg | ORAL_TABLET | Freq: Every day | ORAL | Status: DC

## 2014-08-30 NOTE — Progress Notes (Signed)
Pre visit review using our clinic review tool, if applicable. No additional management support is needed unless otherwise documented below in the visit note. 

## 2014-08-30 NOTE — Assessment & Plan Note (Signed)
Lab Results  Component Value Date   HGBA1C 7.7* 02/26/2014   Had improved but has gained weight Might need insulin

## 2014-08-30 NOTE — Assessment & Plan Note (Signed)
Has been quiet 

## 2014-08-30 NOTE — Assessment & Plan Note (Signed)
BP Readings from Last 3 Encounters:  08/30/14 120/64  06/13/14 110/78  02/26/14 122/76   Good control No changes needed

## 2014-08-30 NOTE — Progress Notes (Signed)
Subjective:    Patient ID: [name] [name], male    DOB: 1951-02-08, 63 y.o.   MRN: [id number]  HPI Doing okay Has noticed SOB--all the time. Even just sitting around. 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 No PND Uses CPAP No recent edema--took some extra fluid pills (didn't affect the breathing issue or may have helped a little) No palpitations Slight dizziness--no syncope. Very brief though  Sugars around 150 fasting Checks once a month or so No hypoglycemic reactions  Current Outpatient Prescriptions on File Prior to Visit  Medication Sig Dispense Refill  . aspirin 81 MG tablet Take 2 tablets (162 mg total) by mouth daily.      Marland Kitchen ezetimibe (ZETIA) 10 MG tablet Take 1 tablet (10 mg total) by mouth daily.  30 tablet  11  . furosemide (LASIX) 40 MG tablet TAKE 1 TABLET TWICE A DAY AS NEEDED  180 tablet  3  . glucose blood (FREESTYLE LITE) test strip Use as instructed, patient tests once daily. Dx: 250.00  300 each  3  . HYDROcodone-acetaminophen (NORCO/VICODIN) 5-325 MG per tablet TAKE 1 TABLET BY MOUTH TWICE A DAY AS NEEDED FOR PAIN.  60 tablet  0  . lisinopril (PRINIVIL,ZESTRIL) 10 MG tablet TAKE 1 TABLET DAILY  90 tablet  3  . metFORMIN (GLUCOPHAGE) 1000 MG tablet Take 1 tablet (1,000 mg total) by mouth 2 (two) times daily with a meal.  180 tablet  3  . pioglitazone (ACTOS) 30 MG tablet TAKE 1 TABLET DAILY  90 tablet  2  . potassium chloride SA (K-DUR,KLOR-CON) 20 MEQ tablet Take 20 mEq by mouth 2 (two) times daily as needed.      . sildenafil (REVATIO) 20 MG tablet Take 3-5 tablets (60-100 mg total) by mouth daily as needed.  50 tablet  11  . triamcinolone cream (KENALOG) 0.1 % Apply 1 application topically 2 (two) times daily as needed.  30 g  0    No current facility-administered medications on file prior to visit.    Allergies  Allergen Reactions  . Crestor [Rosuvastatin]     Muscle aches.  . Doxazosin Mesylate     REACTION: didn't work and may have caused SOB  . Doxycycline     REACTION: unspecified  . Glimepiride     REACTION: red eyes  . Glipizide     REACTION: myalgia??  . Losartan Potassium-Hctz     REACTION: chest \\T \ leg pain  . Metoprolol Tartrate     Mood swings, memory changes  . Pravastatin     Muscle aches   . Rosiglitazone Maleate     REACTION: myalgia  . Simvastatin     REACTION: myalgias  . Carvedilol     Felt bad    Past Medical History  Diagnosis Date  . BPH (benign prostatic hypertrophy)   . Hx of colonic polyps   . Hyperlipidemia   . Hypertension   . Cerebral aneurysm   . Carpal tunnel syndrome   . Arthritis   . Kidney stones   . Angina   . Shortness of breath   . OSA (obstructive sleep apnea)     uses cpap  . GERD (gastroesophageal reflux disease)   . Diabetes mellitus     type 2  .  Coronary artery disease 11/12/11    s/p CABG x 3    Past Surgical History  Procedure Laterality Date  . Knee surgery      left  . Meniscus repair  03/08    right knee  . Hernia repair    . Coronary artery bypass graft  11/12/2011    Procedure: CORONARY ARTERY BYPASS GRAFTING (CABG);  Surgeon: Grace Isaac, MD;  Location: [facility];  Service: Open Heart Surgery;  Laterality: N/A;  Coronary Artery Bypass Graft times three on pump utilizing left internal mammary artery and right saphenous vein harvested endoscopically     Family History  Problem Relation Age of Onset  . Coronary artery disease Father   . Diabetes Neg Hx   . Hypertension Neg Hx   . Prostate cancer Father     History   Social History  . Marital Status: Married    Spouse Name: N/A    Number of Children: 3  . Years of Education: N/A   Occupational History  . tool maker    Social History Main Topics  . Smoking status:  Never Smoker   . Smokeless tobacco: Never Used  . Alcohol Use: No  . Drug Use: No  . Sexual Activity: Yes   Other Topics Concern  . Not on file   Social History Narrative  . No narrative on file   Review of Systems Appetite is "too good" Weight up 7# in 6 months    Objective:   Physical Exam  Constitutional: He appears well-developed and well-nourished. No distress.  Neck: Normal range of motion. Neck supple. No thyromegaly present.  Cardiovascular: Normal rate, regular rhythm, normal heart sounds and intact distal pulses.  Exam reveals no gallop.   No murmur heard. Pulmonary/Chest: Effort normal and breath sounds normal. No respiratory distress. He has no wheezes. He has no rales.  Abdominal: Soft. There is no tenderness.  Musculoskeletal: He exhibits no edema and no tenderness.  Lymphadenopathy:    He has no cervical adenopathy.  Neurological:  Normal sensation in plantar feet  Skin: No rash noted. No erythema.  No foot lesions  Psychiatric: He has a normal mood and affect. His behavior is normal.          Assessment & Plan:

## 2014-08-30 NOTE — Assessment & Plan Note (Addendum)
Doesn't seem to be anginal equivalent and doesn't have CHF Spirometry is normal Will try montelukast in case it is allergy related

## 2014-09-05 LAB — HM DIABETES EYE EXAM

## 2014-09-24 ENCOUNTER — Other Ambulatory Visit: Payer: Self-pay | Admitting: Internal Medicine

## 2014-10-06 ENCOUNTER — Other Ambulatory Visit: Payer: Self-pay | Admitting: Internal Medicine

## 2014-10-07 NOTE — Telephone Encounter (Signed)
08/29/14 

## 2014-10-07 NOTE — Telephone Encounter (Signed)
Left message on machine that rx is ready for pick-up, and it will be at our front desk.  

## 2014-11-14 ENCOUNTER — Encounter: Payer: Self-pay | Admitting: Pulmonary Disease

## 2014-11-14 ENCOUNTER — Ambulatory Visit (INDEPENDENT_AMBULATORY_CARE_PROVIDER_SITE_OTHER): Payer: BC Managed Care – PPO | Admitting: Pulmonary Disease

## 2014-11-14 VITALS — BP 152/76 | HR 71 | Temp 97.0°F | Ht 65.0 in | Wt 225.0 lb

## 2014-11-14 DIAGNOSIS — G4733 Obstructive sleep apnea (adult) (pediatric): Secondary | ICD-10-CM

## 2014-11-14 NOTE — Patient Instructions (Signed)
Will have your machine put on auto to lower your overall pressure during the night. Will let you talk with our patient care coordinators about changing equipment companies if you wish.  Work on weight loss followup with me again in one year.

## 2014-11-14 NOTE — Assessment & Plan Note (Signed)
The patient is wearing his C Pap device regularly, and never sleeps without his machine. He still feels like the pressure is too high, and we had tried to get his device placed on the auto setting last year. His home care company never did this for him, and he is interested in changing to a different company. I will get his machine on the auto setting, and have encouraged him to work aggressively on weight loss. I will see him back in one year if doing well.

## 2014-11-14 NOTE — Progress Notes (Signed)
   Subjective:    Patient ID: [name] [name], male    DOB: 03-12-1951, 63 y.o.   MRN: [id number]  HPI The patient comes in today for follow-up of his obstructive sleep apnea. We had sent in order to his home care company to put his device on the automatic setting, but they have never done this. The patient is unhappy with their services, and wishes to be change to a new home care company. He wears his C Pap compliantly, but continues to feel the pressure is a little high. He is satisfied with his sleep and daytime alertness.   Review of Systems  Constitutional: Negative for fever and unexpected weight change.  HENT: Negative for congestion, dental problem, ear pain, nosebleeds, postnasal drip, rhinorrhea, sinus pressure, sneezing, sore throat and trouble swallowing.   Eyes: Negative for redness and itching.  Respiratory: Negative for cough, chest tightness, shortness of breath and wheezing.   Cardiovascular: Negative for palpitations and leg swelling.  Gastrointestinal: Negative for nausea and vomiting.  Genitourinary: Negative for dysuria.  Musculoskeletal: Negative for joint swelling.  Skin: Negative for rash.  Neurological: Negative for headaches.  Hematological: Does not bruise/bleed easily.  Psychiatric/Behavioral: Negative for dysphoric mood. The patient is not nervous/anxious.        Objective:   Physical Exam Obese male in no acute distress Nose without purulence or discharge noted Neck without lymphadenopathy or thyromegaly No skin breakdown or pressure necrosis from the C Pap mask Lower extremities with mild edema, no cyanosis Alert and oriented, moves all 4 extremities.       Assessment & Plan:

## 2014-11-19 ENCOUNTER — Other Ambulatory Visit: Payer: Self-pay

## 2014-11-19 MED ORDER — HYDROCODONE-ACETAMINOPHEN 5-325 MG PO TABS: ORAL_TABLET | ORAL | Status: DC

## 2014-11-19 NOTE — Telephone Encounter (Signed)
Left message on machine that rx is ready for pick-up, and it will be at our front desk.  

## 2014-11-19 NOTE — Telephone Encounter (Signed)
Pt left v/m requesting rx hydrocodone apap. Call when ready for pick up.  

## 2014-12-04 ENCOUNTER — Other Ambulatory Visit: Payer: Self-pay | Admitting: Cardiovascular Disease

## 2014-12-17 ENCOUNTER — Encounter: Payer: Self-pay | Admitting: Cardiovascular Disease

## 2014-12-17 ENCOUNTER — Ambulatory Visit (INDEPENDENT_AMBULATORY_CARE_PROVIDER_SITE_OTHER): Payer: BLUE CROSS/BLUE SHIELD | Admitting: Cardiovascular Disease

## 2014-12-17 ENCOUNTER — Other Ambulatory Visit: Payer: Self-pay | Admitting: Internal Medicine

## 2014-12-17 VITALS — BP 132/72 | HR 67 | Ht 65.0 in | Wt 224.2 lb

## 2014-12-17 DIAGNOSIS — E118 Type 2 diabetes mellitus with unspecified complications: Secondary | ICD-10-CM

## 2014-12-17 DIAGNOSIS — IMO0002 Reserved for concepts with insufficient information to code with codable children: Secondary | ICD-10-CM

## 2014-12-17 DIAGNOSIS — I2510 Atherosclerotic heart disease of native coronary artery without angina pectoris: Secondary | ICD-10-CM

## 2014-12-17 DIAGNOSIS — E1165 Type 2 diabetes mellitus with hyperglycemia: Secondary | ICD-10-CM

## 2014-12-17 DIAGNOSIS — R0602 Shortness of breath: Secondary | ICD-10-CM

## 2014-12-17 DIAGNOSIS — I10 Essential (primary) hypertension: Secondary | ICD-10-CM

## 2014-12-17 DIAGNOSIS — E785 Hyperlipidemia, unspecified: Secondary | ICD-10-CM

## 2014-12-17 DIAGNOSIS — Z951 Presence of aortocoronary bypass graft: Secondary | ICD-10-CM

## 2014-12-17 MED ORDER — EZETIMIBE 10 MG PO TABS: 10.0000 mg | ORAL_TABLET | Freq: Every day | ORAL | Status: DC

## 2014-12-17 NOTE — Assessment & Plan Note (Signed)
Currently with no symptoms of angina. No further workup at this time. Continue current medication regimen. Long time today spent talking about his diabetes and high cholesterol.

## 2014-12-17 NOTE — Progress Notes (Signed)
Patient ID: [name] [name], male    DOB: 11/21/1951, 64 y.o.   MRN: [id number]  HPI Comments: 64 year old gentleman with obesity, hyperlipidemia, diabetes, hypertension, CAD, s/p CABG. patient of Dr. Silvio Pate, who initially presented for chest pain. Cardiac catheterization showed severe LAD disease and he had bypass surgery November 12 2011. (Coronary artery bypass grafting x3 with the left internal mammary to the left anterior descending coronary artery, reversed saphenous vein graft sequentially to the first and third diagonals with right thigh and the vein harvesting.) He presents for routine followup of his coronary artery disease  He has tried most of the statins before  but was unable to tolerate the medications secondary to myalgias and joint pain  In follow-up today, he has not been very good with his diet. Weight continues to run high. Hemoglobin A1c of 8 Total cholesterol 226 He has had a difficult time affording the zetia and has not been taking this recently. Is not reactive, no regular exercise program. 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   Previously had significant shortness of breath. This seems to have improved. Continues to have  bilateral knee pain.  On prior office visits, reported that After a day in his boots, knees are very sore.   EKG on today's visit shows normal sinus rhythm with rate 67 bpm, no significant ST or T-wave changes   unable to tolerate metoprolol secondary to memory problems and mood disorder. Unable to tolerate carvedilol secondary to fatigue  Previous  Echocardiogram  for his shortness of breath showed normal function, no significant pulmonary hypertension. Normal right ventricular systolic pressure.  Allergies  Allergen Reactions  . Crestor [Rosuvastatin]     Muscle aches.  . Doxazosin Mesylate     REACTION: didn't work and may have caused SOB  . Doxycycline    REACTION: unspecified  . Glimepiride     REACTION: red eyes  . Glipizide     REACTION: myalgia??  . Losartan Potassium-Hctz     REACTION: chest \\T \ leg pain  . Metoprolol Tartrate     Mood swings, memory changes  . Pravastatin     Muscle aches   . Rosiglitazone Maleate     REACTION: myalgia  . Simvastatin     REACTION: myalgias  . Carvedilol     Felt bad    Outpatient Encounter Prescriptions as of 12/17/2014  Medication Sig  . amoxicillin (AMOXIL) 500 MG capsule 1 tablet every 4 hrs  . aspirin 81 MG tablet Take 2 tablets (162 mg total) by mouth daily.  Marland Kitchen ezetimibe (ZETIA) 10 MG tablet Take 1 tablet (10 mg total) by mouth daily.  . furosemide (LASIX) 40 MG tablet TAKE 1 TABLET TWICE A DAY AS NEEDED  . glucose blood (FREESTYLE LITE) test strip Use as instructed, patient tests once daily. Dx: 250.00  . HYDROcodone-acetaminophen (NORCO/VICODIN) 5-325 MG per tablet TAKE 1 TABLET BY MOUTH TWICE A DAY AS NEEDED FOR PAIN.  Marland Kitchen lisinopril (PRINIVIL,ZESTRIL) 10 MG tablet TAKE 1 TABLET DAILY  . metFORMIN (GLUCOPHAGE) 1000 MG tablet TAKE ONE-HALF TABLET BY MOUTH TWICE DAILY WITH MEALS  . pioglitazone (ACTOS) 30 MG tablet TAKE 1 TABLET DAILY  . potassium chloride SA (K-DUR,KLOR-CON) 20 MEQ tablet Take 20 mEq by mouth 2 (two) times daily as needed.  . triamcinolone cream (KENALOG) 0.1 % Apply 1 application topically 2 (two) times daily as needed.  . [DISCONTINUED] ezetimibe (ZETIA) 10 MG  tablet Take 10 mg by mouth daily.  . [DISCONTINUED] potassium chloride SA (K-DUR,KLOR-CON) 20 MEQ tablet TAKE 1 TABLET TWICE A DAY (Patient not taking: Reported on 12/17/2014)    Past Medical History  Diagnosis Date  . BPH (benign prostatic hypertrophy)   . Hx of colonic polyps   . Hyperlipidemia   . Hypertension   . Cerebral aneurysm   . Carpal tunnel syndrome   . Arthritis   . Kidney stones   . Angina   . Shortness of breath   . OSA (obstructive sleep apnea)     uses cpap  . GERD (gastroesophageal  reflux disease)   . Diabetes mellitus     type 2  . Coronary artery disease 11/12/11    s/p CABG x 3    Past Surgical History  Procedure Laterality Date  . Knee surgery      left  . Meniscus repair  03/08    right knee  . Hernia repair    . Coronary artery bypass graft  11/12/2011    Procedure: CORONARY ARTERY BYPASS GRAFTING (CABG);  Surgeon: Grace Isaac, MD;  Location: [facility];  Service: Open Heart Surgery;  Laterality: N/A;  Coronary Artery Bypass Graft times three on pump utilizing left internal mammary artery and right saphenous vein harvested endoscopically     Social History  reports that he has never smoked. He has never used smokeless tobacco. He reports that he does not drink alcohol or use illicit drugs.  Family History family history includes Coronary artery disease in his father; Prostate cancer in his father. There is no history of Diabetes or Hypertension.     Review of Systems  Constitutional: Negative.   Respiratory: Negative.   Cardiovascular: Negative.   Gastrointestinal: Negative.   Endocrine: Negative.   Musculoskeletal: Positive for arthralgias.  Skin: Negative.   Neurological: Negative.   Hematological: Negative.   Psychiatric/Behavioral: Negative.   All other systems reviewed and are negative.  BP 132/72 mmHg  Ht 5\' 5"  (1.651 m)  Wt 224 lb 4 oz (101.719 kg)  BMI 37.32 kg/m2  Physical Exam  Constitutional: He is oriented to person, place, and time. He appears well-developed and well-nourished.  HENT:  Head: Normocephalic.  Nose: Nose normal.  Mouth/Throat: Oropharynx is clear and moist.  Eyes: Conjunctivae are normal. Pupils are equal, round, and reactive to light.  Neck: Normal range of motion. Neck supple. No JVD present.  Cardiovascular: Normal rate, regular rhythm, S1 normal, S2 normal, normal heart sounds and intact distal pulses.  Exam reveals no gallop and no friction rub.   No murmur heard. Pulmonary/Chest: Effort normal and  breath sounds normal. No respiratory distress. He has no wheezes. He has no rales. He exhibits no tenderness.  Abdominal: Soft. Bowel sounds are normal. He exhibits no distension. There is no tenderness.  Musculoskeletal: Normal range of motion. He exhibits no edema or tenderness.  Lymphadenopathy:    He has no cervical adenopathy.  Neurological: He is alert and oriented to person, place, and time. Coordination normal.  Skin: Skin is warm and dry. No rash noted. No erythema.  Psychiatric: He has a normal mood and affect. His behavior is normal. Judgment and thought content normal.      Assessment and Plan   Nursing note and vitals reviewed.

## 2014-12-17 NOTE — Assessment & Plan Note (Signed)
We have encouraged continued exercise, careful diet management in an effort to lose weight. Suggested he talk with Dr.letvak  if numbers continue to run high despite his best efforts. May need more metformin or other generic medication options. He does not want insulin.

## 2014-12-17 NOTE — Assessment & Plan Note (Signed)
No significant shortness of breath symptoms as detailed previously. Likely very deconditioned, obese contributing to shortness of breath symptoms at baseline.

## 2014-12-17 NOTE — Assessment & Plan Note (Signed)
Cholesterol is above goal. Likely worse secondary to his poorly controlled diabetes. He is unable to afford Zetia. Samples and coupon card has been given today He does not want a statin. Recommended weight loss, follow-up with primary care for diabetes

## 2014-12-17 NOTE — Patient Instructions (Signed)
You are doing well. No medication changes were made.  Please call us if you have new issues that need to be addressed before your next appt.  Your physician wants you to follow-up in: 6 months.  You will receive a reminder letter in the mail two months in advance. If you don't receive a letter, please call our office to schedule the follow-up appointment.   

## 2014-12-17 NOTE — Assessment & Plan Note (Signed)
Blood pressure is well controlled on today's visit. No changes made to the medications. 

## 2014-12-17 NOTE — Assessment & Plan Note (Signed)
Currently with no anginal symptoms.

## 2014-12-25 ENCOUNTER — Other Ambulatory Visit: Payer: Self-pay | Admitting: Internal Medicine

## 2014-12-26 ENCOUNTER — Telehealth: Payer: Self-pay | Admitting: Internal Medicine

## 2014-12-26 NOTE — Telephone Encounter (Signed)
ERROR

## 2014-12-26 NOTE — Telephone Encounter (Signed)
Left message on machine that rx is ready for pick-up, and it will be at our front desk.  

## 2014-12-26 NOTE — Telephone Encounter (Signed)
11/19/14 

## 2015-01-26 ENCOUNTER — Other Ambulatory Visit: Payer: Self-pay | Admitting: Internal Medicine

## 2015-02-05 ENCOUNTER — Other Ambulatory Visit: Payer: Self-pay | Admitting: Internal Medicine

## 2015-02-06 NOTE — Telephone Encounter (Signed)
Last filled 12/26/2014.  Refill request for hydrocodone.  Please advise.

## 2015-02-06 NOTE — Telephone Encounter (Signed)
Left message informing patient hydrocodone rx is ready to pick up.

## 2015-03-04 ENCOUNTER — Encounter: Payer: Self-pay | Admitting: Internal Medicine

## 2015-03-04 ENCOUNTER — Ambulatory Visit (INDEPENDENT_AMBULATORY_CARE_PROVIDER_SITE_OTHER): Payer: BLUE CROSS/BLUE SHIELD | Admitting: Internal Medicine

## 2015-03-04 VITALS — BP 120/70 | HR 72 | Temp 98.0°F | Ht 65.0 in | Wt 218.0 lb

## 2015-03-04 DIAGNOSIS — Z125 Encounter for screening for malignant neoplasm of prostate: Secondary | ICD-10-CM | POA: Diagnosis not present

## 2015-03-04 DIAGNOSIS — C189 Malignant neoplasm of colon, unspecified: Secondary | ICD-10-CM

## 2015-03-04 DIAGNOSIS — I2510 Atherosclerotic heart disease of native coronary artery without angina pectoris: Secondary | ICD-10-CM

## 2015-03-04 DIAGNOSIS — E118 Type 2 diabetes mellitus with unspecified complications: Secondary | ICD-10-CM

## 2015-03-04 DIAGNOSIS — Z0000 Encounter for general adult medical examination without abnormal findings: Secondary | ICD-10-CM

## 2015-03-04 DIAGNOSIS — E1165 Type 2 diabetes mellitus with hyperglycemia: Secondary | ICD-10-CM

## 2015-03-04 DIAGNOSIS — I10 Essential (primary) hypertension: Secondary | ICD-10-CM | POA: Diagnosis not present

## 2015-03-04 DIAGNOSIS — E785 Hyperlipidemia, unspecified: Secondary | ICD-10-CM

## 2015-03-04 DIAGNOSIS — IMO0002 Reserved for concepts with insufficient information to code with codable children: Secondary | ICD-10-CM

## 2015-03-04 LAB — COMPREHENSIVE METABOLIC PANEL
ALT: 15 U/L (ref 0–53)
AST: 14 U/L (ref 0–37)
Albumin: 4.2 g/dL (ref 3.5–5.2)
Alkaline Phosphatase: 86 U/L (ref 39–117)
BUN: 18 mg/dL (ref 6–23)
CO2: 32 meq/L (ref 19–32)
Calcium: 9.4 mg/dL (ref 8.4–10.5)
Chloride: 101 mEq/L (ref 96–112)
Creatinine, Ser: 1 mg/dL (ref 0.40–1.50)
GFR: 79.86 mL/min (ref >60.00)
Glucose, Bld: 160 mg/dL — ABNORMAL HIGH (ref 70–99)
POTASSIUM: 4.1 meq/L (ref 3.5–5.1)
Sodium: 138 mEq/L (ref 135–145)
Total Bilirubin: 0.6 mg/dL (ref 0.2–1.2)
Total Protein: 6.6 g/dL (ref 6.0–8.3)

## 2015-03-04 LAB — LIPID PANEL
CHOLESTEROL: 172 mg/dL (ref 0–200)
HDL: 46.9 mg/dL (ref >39.00)
LDL Cholesterol: 106 mg/dL — ABNORMAL HIGH (ref 0–99)
NONHDL: 125.1
Total CHOL/HDL Ratio: 4
Triglycerides: 96 mg/dL (ref 0.0–149.0)
VLDL: 19.2 mg/dL (ref 0.0–40.0)

## 2015-03-04 LAB — CBC WITH DIFFERENTIAL/PLATELET
Basophils Absolute: 0.1 10*3/uL (ref 0.0–0.1)
Basophils Relative: 1.6 % (ref 0.0–3.0)
Eosinophils Absolute: 0.2 10*3/uL (ref 0.0–0.7)
Eosinophils Relative: 3 % (ref 0.0–5.0)
HEMATOCRIT: 45.2 % (ref 39.0–52.0)
Hemoglobin: 15.1 g/dL (ref 13.0–17.0)
Lymphocytes Relative: 34.2 % (ref 12.0–46.0)
Lymphs Abs: 1.9 10*3/uL (ref 0.7–4.0)
MCHC: 33.4 g/dL (ref 30.0–36.0)
MCV: 87.1 fl (ref 78.0–100.0)
MONOS PCT: 10.5 % (ref 3.0–12.0)
Monocytes Absolute: 0.6 10*3/uL (ref 0.1–1.0)
NEUTROS ABS: 2.8 10*3/uL (ref 1.4–7.7)
Neutrophils Relative %: 50.7 % (ref 43.0–77.0)
Platelets: 234 10*3/uL (ref 150.0–400.0)
RBC: 5.19 Mil/uL (ref 4.22–5.81)
RDW: 14.7 % (ref 11.5–15.5)
WBC: 5.5 10*3/uL (ref 4.0–10.5)

## 2015-03-04 LAB — HEMOGLOBIN A1C: Hgb A1c MFr Bld: 8 % — ABNORMAL HIGH (ref 4.6–6.5)

## 2015-03-04 LAB — T4, FREE: FREE T4: 0.92 ng/dL (ref 0.60–1.60)

## 2015-03-04 LAB — PSA: PSA: 0.87 ng/mL (ref 0.10–4.00)

## 2015-03-04 LAB — HM DIABETES FOOT EXAM

## 2015-03-04 NOTE — Assessment & Plan Note (Signed)
Borderline control Has lost some weight and made small lifestyle changes Hopefully will be back under 8%

## 2015-03-04 NOTE — Progress Notes (Signed)
Pre visit review using our clinic review tool, if applicable. No additional management support is needed unless otherwise documented below in the visit note. 

## 2015-03-04 NOTE — Progress Notes (Signed)
Subjective:    Patient ID: [name] [name], male    DOB: Jul 03, 1951, 64 y.o.   MRN: [id number]  HPI Here for physical  He feels he is doing fairly well Checks sugar about once a week--- usually 135-150 Weight is down some---trying to do a little better with his eating Occasional toe pain or tingle--nothing persistent. May be from being on his feet all day No foot sores Gets eye exams every 6 months Trying to take the zetia Has breaking out under his eyes--crystallized stuff. Seemed to be worse with the zetia  No heart problems Keeps up with cardiologist  Current Outpatient Prescriptions on File Prior to Visit  Medication Sig Dispense Refill  . aspirin 81 MG tablet Take 2 tablets (162 mg total) by mouth daily.    Marland Kitchen ezetimibe (ZETIA) 10 MG tablet Take 1 tablet (10 mg total) by mouth daily. 30 tablet 11  . furosemide (LASIX) 40 MG tablet TAKE 1 TABLET TWICE A DAY AS NEEDED 180 tablet 3  . glucose blood (FREESTYLE LITE) test strip Use as instructed, patient tests once daily. Dx: 250.00 300 each 3  . HYDROcodone-acetaminophen (NORCO/VICODIN) 5-325 MG per tablet TAKE 1 TABLET BY MOUTH TWICE A DAY AS NEEDED FOR PAIN. 60 tablet 0  . lisinopril (PRINIVIL,ZESTRIL) 10 MG tablet TAKE 1 TABLET DAILY 90 tablet 3  . metFORMIN (GLUCOPHAGE) 1000 MG tablet TAKE ONE-HALF TABLET BY MOUTH TWICE DAILY WITH MEALS 180 tablet 0  . pioglitazone (ACTOS) 30 MG tablet TAKE 1 TABLET DAILY 90 tablet 1  . potassium chloride SA (K-DUR,KLOR-CON) 20 MEQ tablet Take 20 mEq by mouth 2 (two) times daily as needed.    . triamcinolone cream (KENALOG) 0.1 % Apply 1 application topically 2 (two) times daily as needed. 30 g 0   No current facility-administered medications on file prior to visit.    Allergies  Allergen Reactions  . Crestor [Rosuvastatin]     Muscle aches.  . Doxazosin Mesylate     REACTION: didn't work and may have caused SOB  . Doxycycline     REACTION: unspecified  . Glimepiride    REACTION: red eyes  . Glipizide     REACTION: myalgia??  . Losartan Potassium-Hctz     REACTION: chest \\T \ leg pain  . Metoprolol Tartrate     Mood swings, memory changes  . Pravastatin     Muscle aches   . Rosiglitazone Maleate     REACTION: myalgia  . Simvastatin     REACTION: myalgias  . Carvedilol     Felt bad    Past Medical History  Diagnosis Date  . BPH (benign prostatic hypertrophy)   . Hx of colonic polyps   . Hyperlipidemia   . Hypertension   . Cerebral aneurysm   . Carpal tunnel syndrome   . Arthritis   . Kidney stones   . Angina   . Shortness of breath   . OSA (obstructive sleep apnea)     uses cpap  . GERD (gastroesophageal reflux disease)   . Diabetes mellitus     type 2  . Coronary artery disease 11/12/11    s/p CABG x 3    Past Surgical History  Procedure Laterality Date  . Knee surgery      left  . Meniscus repair  03/08    right knee  . Hernia repair    . Coronary artery bypass graft  11/12/2011    Procedure: CORONARY ARTERY BYPASS GRAFTING (CABG);  Surgeon: Percell Miller  Maryruth Bun, MD;  Location: [facility];  Service: Open Heart Surgery;  Laterality: N/A;  Coronary Artery Bypass Graft times three on pump utilizing left internal mammary artery and right saphenous vein harvested endoscopically     Family History  Problem Relation Age of Onset  . Coronary artery disease Father   . Diabetes Neg Hx   . Hypertension Neg Hx   . Prostate cancer Father     History   Social History  . Marital Status: Married    Spouse Name: N/A  . Number of Children: 3  . Years of Education: N/A   Occupational History  . tool maker    Social History Main Topics  . Smoking status: Never Smoker   . Smokeless tobacco: Never Used  . Alcohol Use: No  . Drug Use: No  . Sexual Activity: Yes   Other Topics Concern  . Not on file   Social History Narrative   Review of Systems  Constitutional: Negative for fatigue.       Weight is down a few pounds Wears seat belt  intermittently --counseled  HENT: Positive for dental problem. Negative for hearing loss and tinnitus.        Only a couple of teeth left--worst were pulled last fall. Delayed healing so no partials yet  Eyes: Negative for visual disturbance.       Keeps up with eye doctor every 6 months  Respiratory: Negative for cough, chest tightness and shortness of breath.   Cardiovascular: Positive for chest pain. Negative for palpitations and leg swelling.       Pulled something in his chest putting a roof on a double wide--close to shoulders  Gastrointestinal: Negative for nausea, vomiting, abdominal pain, constipation and blood in stool.       No heartburn  Endocrine: Negative for polydipsia and polyuria.  Genitourinary: Negative for urgency and difficulty urinating.       No sexual problems  Musculoskeletal: Positive for arthralgias. Negative for back pain and joint swelling.       Mild knee pain  Skin: Negative for rash.       Rash behind ears better with cream Spot in right preauricular area needs checking  Allergic/Immunologic: Positive for environmental allergies. Negative for immunocompromised state.       Using OTC allergy meds with success  Neurological: Negative for dizziness, syncope, weakness, light-headedness and headaches.  Hematological: Positive for adenopathy. Bruises/bleeds easily.       Occ notes nodes in neck  Psychiatric/Behavioral: Negative for sleep disturbance and dysphoric mood. The patient is not nervous/anxious.        Objective:   Physical Exam  Constitutional: He appears well-developed and well-nourished. No distress.  HENT:  Head: Normocephalic and atraumatic.  Right Ear: External ear normal.  Left Ear: External ear normal.  Mouth/Throat: Oropharynx is clear and moist. No oropharyngeal exudate.  Eyes: Conjunctivae and EOM are normal. Pupils are equal, round, and reactive to light.  Neck: Normal range of motion. Neck supple. No thyromegaly present.    Cardiovascular: Normal rate, regular rhythm, normal heart sounds and intact distal pulses.  Exam reveals no gallop.   No murmur heard. Pulmonary/Chest: Effort normal and breath sounds normal. No respiratory distress. He has no wheezes. He has no rales.  Abdominal: Soft. There is no tenderness.  Musculoskeletal: He exhibits no edema or tenderness.  Lymphadenopathy:    He has no cervical adenopathy.  Skin: No rash noted.  Mild scaling in right preauricular area--but not actinics  No foot lesions  Psychiatric: He has a normal mood and affect. His behavior is normal.          Assessment & Plan:

## 2015-03-04 NOTE — Assessment & Plan Note (Signed)
Has been quiet On appropriate regimen

## 2015-03-04 NOTE — Assessment & Plan Note (Signed)
Intolerant of multiple statins and not sure he can tolerate zetia

## 2015-03-04 NOTE — Assessment & Plan Note (Signed)
Discussed fitness efforts and better eating Will check PSA after discussion He did have tiny adenomatous polyp---didn't go back for recall colon (was due 2013) and doesn't really want it now. Will check immunoassay Doesn't want flu shots

## 2015-03-04 NOTE — Assessment & Plan Note (Signed)
BP Readings from Last 3 Encounters:  03/04/15 120/70  12/17/14 132/72  11/14/14 152/76   Good control now

## 2015-03-05 ENCOUNTER — Encounter: Payer: Self-pay | Admitting: *Deleted

## 2015-03-06 LAB — NICOTINE/COTININE METABOLITES: Cotinine: <10 ng/mL

## 2015-03-07 ENCOUNTER — Encounter: Payer: Self-pay | Admitting: *Deleted

## 2015-03-07 LAB — HM DIABETES EYE EXAM

## 2015-03-13 ENCOUNTER — Other Ambulatory Visit: Payer: Self-pay | Admitting: Internal Medicine

## 2015-03-14 NOTE — Telephone Encounter (Signed)
Approved: #[id number] x 0 

## 2015-03-14 NOTE — Telephone Encounter (Signed)
Left message on machine that rx is ready for pick-up, and it will be at our front desk.  

## 2015-03-14 NOTE — Telephone Encounter (Signed)
02/06/15 

## 2015-03-21 ENCOUNTER — Other Ambulatory Visit (INDEPENDENT_AMBULATORY_CARE_PROVIDER_SITE_OTHER): Payer: BLUE CROSS/BLUE SHIELD

## 2015-03-21 DIAGNOSIS — C189 Malignant neoplasm of colon, unspecified: Secondary | ICD-10-CM | POA: Diagnosis not present

## 2015-03-21 LAB — FECAL OCCULT BLOOD, IMMUNOCHEMICAL: Fecal Occult Bld: NEGATIVE

## 2015-03-24 ENCOUNTER — Encounter: Payer: Self-pay | Admitting: *Deleted

## 2015-04-19 ENCOUNTER — Other Ambulatory Visit: Payer: Self-pay | Admitting: Internal Medicine

## 2015-04-21 NOTE — Telephone Encounter (Signed)
03/14/15 

## 2015-04-21 NOTE — Telephone Encounter (Signed)
Spoke with patient and advised rx ready for pick-up and it will be at the front desk.  

## 2015-05-07 ENCOUNTER — Telehealth: Payer: Self-pay | Admitting: Pulmonary Disease

## 2015-05-07 DIAGNOSIS — G4733 Obstructive sleep apnea (adult) (pediatric): Secondary | ICD-10-CM

## 2015-05-07 NOTE — Telephone Encounter (Signed)
Spoke with pt's wife.  Was advised pt is currently at work.  She will ask pt to call office back.

## 2015-05-08 NOTE — Telephone Encounter (Signed)
Called and spoke to pt. Pt stated he is needing supplies but he thinks [facility] is needing more information. Called and spoke to [place] at [facility] and they are only needing an rx with the pt's supplies listed. Rx sent. Informed pt. Pt verbalized understanding and denied any further questions or concerns at this time.

## 2015-05-20 ENCOUNTER — Other Ambulatory Visit: Payer: Self-pay | Admitting: Internal Medicine

## 2015-05-23 ENCOUNTER — Other Ambulatory Visit: Payer: Self-pay | Admitting: Internal Medicine

## 2015-05-26 NOTE — Telephone Encounter (Signed)
Left message on machine that rx is ready for pick-up, and it will be at our front desk.  

## 2015-05-26 NOTE — Telephone Encounter (Signed)
04/21/15 

## 2015-06-12 ENCOUNTER — Other Ambulatory Visit: Payer: Self-pay | Admitting: Cardiovascular Disease

## 2015-07-03 ENCOUNTER — Other Ambulatory Visit: Payer: Self-pay | Admitting: Internal Medicine

## 2015-07-04 NOTE — Telephone Encounter (Signed)
Spoke with patient and advised rx ready for pick-up and it will be at the front desk.  

## 2015-07-04 NOTE — Telephone Encounter (Signed)
Last filled 05/26/2015 [name] PATIENT, Please send back to me for call in

## 2015-07-04 NOTE — Telephone Encounter (Signed)
RX printed and signed and placed on DS desk

## 2015-07-24 ENCOUNTER — Other Ambulatory Visit: Payer: Self-pay | Admitting: Cardiovascular Disease

## 2015-08-04 ENCOUNTER — Encounter: Payer: Self-pay | Admitting: Cardiovascular Disease

## 2015-08-04 ENCOUNTER — Ambulatory Visit (INDEPENDENT_AMBULATORY_CARE_PROVIDER_SITE_OTHER): Payer: BLUE CROSS/BLUE SHIELD | Admitting: Cardiovascular Disease

## 2015-08-04 VITALS — BP 120/70 | HR 69 | Ht 65.0 in | Wt 210.2 lb

## 2015-08-04 DIAGNOSIS — I10 Essential (primary) hypertension: Secondary | ICD-10-CM | POA: Diagnosis not present

## 2015-08-04 DIAGNOSIS — E1165 Type 2 diabetes mellitus with hyperglycemia: Secondary | ICD-10-CM

## 2015-08-04 DIAGNOSIS — E785 Hyperlipidemia, unspecified: Secondary | ICD-10-CM

## 2015-08-04 DIAGNOSIS — E118 Type 2 diabetes mellitus with unspecified complications: Secondary | ICD-10-CM | POA: Diagnosis not present

## 2015-08-04 DIAGNOSIS — I2510 Atherosclerotic heart disease of native coronary artery without angina pectoris: Secondary | ICD-10-CM | POA: Diagnosis not present

## 2015-08-04 DIAGNOSIS — Z951 Presence of aortocoronary bypass graft: Secondary | ICD-10-CM

## 2015-08-04 DIAGNOSIS — IMO0002 Reserved for concepts with insufficient information to code with codable children: Secondary | ICD-10-CM

## 2015-08-04 MED ORDER — EZETIMIBE 10 MG PO TABS: 10.0000 mg | ORAL_TABLET | Freq: Every day | ORAL | Status: DC

## 2015-08-04 NOTE — Assessment & Plan Note (Signed)
Currently denies any anginal symptoms

## 2015-08-04 NOTE — Patient Instructions (Addendum)
You are doing well.  Please restart zetia one a day for cholesterol  Please call us if you have new issues that need to be addressed before your next appt.  Your physician wants you to follow-up in: 6 months.  You will receive a reminder letter in the mail two months in advance. If you don't receive a letter, please call our office to schedule the follow-up appointment.

## 2015-08-04 NOTE — Assessment & Plan Note (Signed)
Blood pressure is well controlled on today's visit. No changes made to the medications. 

## 2015-08-04 NOTE — Assessment & Plan Note (Signed)
Currently with no symptoms of angina. No further workup at this time. Continue current medication regimen. 

## 2015-08-04 NOTE — Assessment & Plan Note (Signed)
Prescription for zetia set locally with a coupon He's had difficulty tolerating statins. Recommended he consider trying a low-dose statin 2 or 3 times per week He will call us if he would like to try this

## 2015-08-04 NOTE — Progress Notes (Signed)
Patient ID: [name] [name], male    DOB: 04/25/1951, 64 y.o.   MRN: [id number]  HPI Comments: 64 year old gentleman with obesity, hyperlipidemia, diabetes, hypertension, CAD, s/p CABG. patient of Dr. Silvio Pate, who initially presented for chest pain. Cardiac catheterization showed severe LAD disease and he had bypass surgery November 12 2011. 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 Denies any shortness of breath or chest discomfort. Again reports he is unable to tolerate a statin including Crestor, Lipitor, simvastatin. Prescription given previously for Z[facility]ia. [facility], cost him more than $200 Was unable to use the free coupon  EKG on today's visit shows normal sinus rhythm with rate 69 bpm, no significant ST or T-wave changes  Other past medical history  Previous lab work, Hemoglobin A1c of 8, Total cholesterol 226  Previously had significant shortness of breath. This seems to have improved. Continues to have  bilateral knee pain.  On prior office visits, reported that After a day in his boots, knees are very sore.    unable to tolerate metoprolol secondary to memory problems and mood disorder. Unable to tolerate carvedilol secondary to fatigue  Previous  Echocardiogram  for his shortness of breath showed normal function, no significant pulmonary hypertension. Normal right ventricular systolic pressure.  Allergies  Allergen  Reactions  . Crestor [Rosuvastatin]     Muscle aches.  . Doxazosin Mesylate     REACTION: didn't work and may have caused SOB  . Doxycycline     REACTION: unspecified  . Glimepiride     REACTION: red eyes  . Glipizide     REACTION: myalgia??  . Losartan Potassium-Hctz     REACTION: chest \\T \ leg pain  . Metoprolol Tartrate     Mood swings, memory changes  . Pravastatin     Muscle aches   . Rosiglitazone Maleate     REACTION: myalgia  . Simvastatin     REACTION: myalgias  . Carvedilol     Felt bad    Outpatient Encounter Prescriptions as of 08/04/2015  Medication Sig  . aspirin 81 MG tablet Take 2 tablets (162 mg total) by mouth daily.  . furosemide (LASIX) 40 MG tablet TAKE 1 TABLET TWICE A DAY AS NEEDED  . glucose blood (FREESTYLE LITE) test strip Use as instructed, patient tests once daily. Dx: 250.00  . HYDROcodone-acetaminophen (NORCO/VICODIN) 5-325 MG per tablet TAKE 1 TABLET BY MOUTH TWICE A DAY AS NEEDED FOR PAIN.  Marland Kitchen lisinopril (PRINIVIL,ZESTRIL) 10 MG tablet TAKE 1 TABLET DAILY  . metFORMIN (GLUCOPHAGE) 1000 MG tablet TAKE ONE-HALF TABLET BY MOUTH TWICE DAILY WITH  MEALS  . pioglitazone (ACTOS) 30 MG tablet TAKE 1 TABLET DAILY  . potassium chloride SA (K-DUR,KLOR-CON) 20 MEQ tablet Take 20 mEq by mouth 2 (two) times daily as needed.  . triamcinolone cream (KENALOG) 0.1 % Apply 1 application topically 2 (two) times daily as needed.  . ezetimibe (ZETIA) 10 MG tablet Take 1 tablet (10  mg total) by mouth daily.  . [DISCONTINUED] ezetimibe (ZETIA) 10 MG tablet Take 1 tablet (10 mg total) by mouth daily. (Patient not taking: Reported on 08/04/2015)   No facility-administered encounter medications on file as of 08/04/2015.    Past Medical History  Diagnosis Date  . BPH (benign prostatic hypertrophy)   . Hx of colonic polyps   . Hyperlipidemia   . Hypertension   . Cerebral aneurysm   . Carpal tunnel syndrome   . Arthritis   . Kidney stones   . Angina   . Shortness  of breath   . OSA (obstructive sleep apnea)     uses cpap  . GERD (gastroesophageal reflux disease)   . Diabetes mellitus     type 2  . Coronary artery disease 11/12/11    s/p CABG x 3    Past Surgical History  Procedure Laterality Date  . Knee surgery      left  . Meniscus repair  03/08    right knee  . Hernia repair    . Coronary artery bypass graft  11/12/2011    Procedure: CORONARY ARTERY BYPASS GRAFTING (CABG);  Surgeon: Grace Isaac, MD;  Location: [facility];  Service: Open Heart Surgery;  Laterality: N/A;  Coronary Artery Bypass Graft times three on pump utilizing left internal mammary artery and right saphenous vein harvested endoscopically     Social History  reports that he has never smoked. He has never used smokeless tobacco. He reports that he does not drink alcohol or use illicit drugs.  Family History family history includes Coronary artery disease in his father; Prostate cancer in his father. There is no history of Diabetes or Hypertension.     Review of Systems  Constitutional: Negative.   Respiratory: Negative.   Cardiovascular: Negative.   Gastrointestinal: Negative.   Musculoskeletal: Positive for arthralgias.  Neurological: Negative.   Hematological: Negative.   Psychiatric/Behavioral: Negative.   All other systems reviewed and are negative.  BP 120/70 mmHg  Pulse 69  Ht 5\' 5"  (1.651 m)  Wt 210 lb 4 oz (95.369 kg)  BMI 34.99 kg/m2  Physical Exam  Constitutional: He is oriented to person, place, and time. He appears well-developed and well-nourished.  Obese  HENT:  Head: Normocephalic.  Nose: Nose normal.  Mouth/Throat: Oropharynx is clear and moist.  Eyes: Conjunctivae are normal. Pupils are equal, round, and reactive to light.  Neck: Normal range of motion. Neck supple. No JVD present.  Cardiovascular: Normal rate, regular rhythm, S1 normal, S2 normal, normal heart sounds and intact distal pulses.  Exam reveals no gallop and no friction  rub.   No murmur heard. Pulmonary/Chest: Effort normal and breath sounds normal. No respiratory distress. He has no wheezes. He has no rales. He exhibits no tenderness.  Abdominal: Soft. Bowel sounds are normal. He exhibits no distension. There is no tenderness.  Musculoskeletal: Normal range of motion. He exhibits no edema or tenderness.  Lymphadenopathy:    He has no cervical adenopathy.  Neurological: He is alert and oriented to person, place, and time. Coordination normal.  Skin: Skin is warm and dry. No rash noted. No erythema.  Psychiatric: He has a normal mood and affect. His behavior is normal. Judgment and thought content normal.      Assessment and Plan   Nursing note and vitals reviewed.

## 2015-08-04 NOTE — Assessment & Plan Note (Signed)
He reports that he has lost weight through the summer. Has not felt like eating when he is very hot

## 2015-08-14 ENCOUNTER — Other Ambulatory Visit: Payer: Self-pay | Admitting: Internal Medicine

## 2015-08-15 NOTE — Telephone Encounter (Signed)
Left message on machine that rx is ready for pick-up, and it will be at our front desk.  

## 2015-08-15 NOTE — Telephone Encounter (Signed)
Last filled 07/04/15 #60--please advise if okay to refill

## 2015-09-09 ENCOUNTER — Ambulatory Visit (INDEPENDENT_AMBULATORY_CARE_PROVIDER_SITE_OTHER)
Admission: RE | Admit: 2015-09-09 | Discharge: 2015-09-09 | Disposition: A | Discharge status: Home / Self Care | Payer: BLUE CROSS/BLUE SHIELD | Source: Ambulatory Visit | Attending: Internal Medicine | Admitting: Internal Medicine

## 2015-09-09 ENCOUNTER — Encounter: Payer: Self-pay | Admitting: Internal Medicine

## 2015-09-09 ENCOUNTER — Ambulatory Visit (INDEPENDENT_AMBULATORY_CARE_PROVIDER_SITE_OTHER): Payer: BLUE CROSS/BLUE SHIELD | Admitting: Internal Medicine

## 2015-09-09 VITALS — BP 136/82 | HR 71 | Temp 97.9°F | Wt 213.0 lb

## 2015-09-09 DIAGNOSIS — M25511 Pain in right shoulder: Secondary | ICD-10-CM | POA: Diagnosis not present

## 2015-09-09 DIAGNOSIS — E1165 Type 2 diabetes mellitus with hyperglycemia: Secondary | ICD-10-CM

## 2015-09-09 DIAGNOSIS — I2510 Atherosclerotic heart disease of native coronary artery without angina pectoris: Secondary | ICD-10-CM

## 2015-09-09 DIAGNOSIS — E118 Type 2 diabetes mellitus with unspecified complications: Secondary | ICD-10-CM

## 2015-09-09 DIAGNOSIS — IMO0002 Reserved for concepts with insufficient information to code with codable children: Secondary | ICD-10-CM

## 2015-09-09 DIAGNOSIS — I10 Essential (primary) hypertension: Secondary | ICD-10-CM | POA: Diagnosis not present

## 2015-09-09 LAB — HEMOGLOBIN A1C: Hgb A1c MFr Bld: 8.5 % — ABNORMAL HIGH (ref 4.6–6.5)

## 2015-09-09 NOTE — Assessment & Plan Note (Signed)
Has been quiet On ACEI, asa, cholesterol med

## 2015-09-09 NOTE — Progress Notes (Signed)
Pre visit review using our clinic review tool, if applicable. No additional management support is needed unless otherwise documented below in the visit note. 

## 2015-09-09 NOTE — Assessment & Plan Note (Signed)
BP Readings from Last 3 Encounters:  09/09/15 136/82  08/04/15 120/70  03/04/15 120/70   Acceptable control

## 2015-09-09 NOTE — Assessment & Plan Note (Signed)
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

## 2015-09-09 NOTE — Assessment & Plan Note (Signed)
?  underlying arthritis No evidence of impingement or rotator cuff tear Will check x-ray  Consider cortisone shot if not improving

## 2015-09-09 NOTE — Progress Notes (Signed)
Subjective:    Patient ID: [name] [name], male    DOB: 1951-09-15, 64 y.o.   MRN: [id number]  HPI Here for follow up of diabetes and other chronic medical conditions  Having trouble with his right shoulder Now with limited use Pain all the time --especially at night Hasn't kept him from working but slows him down Wonders about cortisone Uses naproxen sporadically --not clearly helpful Hydrocodone eases it off (but can't use at night-- keeps him up)  Diarrhea this summer--more often than usual Watery and ~2 per day Lots of gas No pain or blood No change in metformin--but doesn't eat breakfast No incontinence  Checks sugars sporadically Running a bit high-- 180 fasting No hypoglycemic reactions Eye exam in spring--done twice this year ([facility])  No chest pain Gets occasional fluttering--thought it was his cell phone No SOB No dizziness or syncope  Current Outpatient Prescriptions on File Prior to Visit  Medication Sig Dispense Refill  . aspirin 81 MG tablet Take 2 tablets (162 mg total) by mouth daily.    Marland Kitchen ezetimibe (ZETIA) 10 MG tablet Take 1 tablet (10 mg total) by mouth daily. 30 tablet 11  . furosemide (LASIX) 40 MG tablet TAKE 1 TABLET TWICE A DAY AS NEEDED 180 tablet 3  . glucose blood (FREESTYLE LITE) test strip Use as instructed, patient tests once daily. Dx: 250.00 300 each 3  . HYDROcodone-acetaminophen (NORCO/VICODIN) 5-325 MG per tablet TAKE 1 TABLET BY MOUTH TWICE A DAY AS NEEDED FOR PAIN. 60 tablet 0  . lisinopril (PRINIVIL,ZESTRIL) 10 MG tablet TAKE 1 TABLET DAILY 90 tablet 3  . metFORMIN (GLUCOPHAGE) 1000 MG tablet TAKE ONE-HALF TABLET BY MOUTH TWICE DAILY WITH  MEALS 180 tablet 0  . pioglitazone (ACTOS) 30 MG tablet TAKE 1 TABLET DAILY 90 tablet 1  . potassium chloride SA (K-DUR,KLOR-CON) 20 MEQ tablet Take 20 mEq by mouth 2 (two) times daily as needed.    . triamcinolone cream (KENALOG) 0.1 % Apply 1 application topically 2 (two) times daily as  needed. 30 g 0   No current facility-administered medications on file prior to visit.    Allergies  Allergen Reactions  . Crestor [Rosuvastatin]     Muscle aches.  . Doxazosin Mesylate     REACTION: didn't work and may have caused SOB  . Doxycycline     REACTION: unspecified  . Glimepiride     REACTION: red eyes  . Glipizide     REACTION: myalgia??  . Losartan Potassium-Hctz     REACTION: chest \\T \ leg pain  . Metoprolol Tartrate     Mood swings, memory changes  . Pravastatin     Muscle aches   . Rosiglitazone Maleate     REACTION: myalgia  . Simvastatin     REACTION: myalgias  . Carvedilol     Felt bad    Past Medical History  Diagnosis Date  . BPH (benign prostatic hypertrophy)   . Hx of colonic polyps   . Hyperlipidemia   . Hypertension   . Cerebral aneurysm   . Carpal tunnel syndrome   . Arthritis   . Kidney stones   . Angina   . Shortness of breath   . OSA (obstructive sleep apnea)     uses cpap  . GERD (gastroesophageal reflux disease)   . Diabetes mellitus     type 2  . Coronary artery disease 11/12/11    s/p CABG x 3    Past Surgical History  Procedure Laterality  Date  . Knee surgery      left  . Meniscus repair  03/08    right knee  . Hernia repair    . Coronary artery bypass graft  11/12/2011    Procedure: CORONARY ARTERY BYPASS GRAFTING (CABG);  Surgeon: Grace Isaac, MD;  Location: [facility];  Service: Open Heart Surgery;  Laterality: N/A;  Coronary Artery Bypass Graft times three on pump utilizing left internal mammary artery and right saphenous vein harvested endoscopically     Family History  Problem Relation Age of Onset  . Coronary artery disease Father   . Diabetes Neg Hx   . Hypertension Neg Hx   . Prostate cancer Father     Social History   Social History  . Marital Status: Married    Spouse Name: N/A  . Number of Children: 3  . Years of Education: N/A   Occupational History  . tool maker    Social History Main  Topics  . Smoking status: Never Smoker   . Smokeless tobacco: Never Used  . Alcohol Use: No  . Drug Use: No  . Sexual Activity: Yes   Other Topics Concern  . Not on file   Social History Narrative   Review of Systems  Sleeps okay if shoulders and knee pain is not too bad Appetite is okay--taste is not good though Weight is stable     Objective:   Physical Exam  Constitutional: He appears well-developed and well-nourished. No distress.  Neck: Normal range of motion. Neck supple.  Cardiovascular: Normal rate, regular rhythm, normal heart sounds and intact distal pulses.  Exam reveals no gallop.   No murmur heard. Pulmonary/Chest: Effort normal and breath sounds normal. No respiratory distress. He has no wheezes. He has no rales.  Musculoskeletal:  No tenderness in right shoulder Abduction limited to 90 degrees Fair internal and external rotation but some crepitus  Lymphadenopathy:    He has no cervical adenopathy.  Skin:  No foot lesions          Assessment & Plan:

## 2015-09-10 ENCOUNTER — Encounter: Payer: Self-pay | Admitting: *Deleted

## 2015-09-11 ENCOUNTER — Other Ambulatory Visit: Payer: Self-pay | Admitting: Internal Medicine

## 2015-09-12 ENCOUNTER — Encounter: Payer: Self-pay | Admitting: Internal Medicine

## 2015-09-12 ENCOUNTER — Ambulatory Visit (INDEPENDENT_AMBULATORY_CARE_PROVIDER_SITE_OTHER): Payer: BLUE CROSS/BLUE SHIELD | Admitting: Internal Medicine

## 2015-09-12 VITALS — BP 138/80 | Wt 213.0 lb

## 2015-09-12 DIAGNOSIS — M25511 Pain in right shoulder: Secondary | ICD-10-CM | POA: Diagnosis not present

## 2015-09-12 NOTE — Progress Notes (Signed)
Pre visit review using our clinic review tool, if applicable. No additional management support is needed unless otherwise documented below in the visit note. 

## 2015-09-12 NOTE — Progress Notes (Signed)
   Subjective:    Patient ID: [name] [name], male    DOB: January 28, 1951, 64 y.o.   MRN: [id number]  HPI Here for right shoulder injection He now remembers that he fell off a loader from about 2 feet up and came down on that shoulder  Review of Systems     Objective:   Physical Exam        Assessment & Plan:

## 2015-09-12 NOTE — Assessment & Plan Note (Signed)
PROCEDURE Sterile prep posterior approach to right shoulder 1cc 2% lidocaine after ethyl chloride 40mg  depomedrol and 6cc 2% plain lidocaine instilled in right shoulder without incident Tolerated well Discussed home care

## 2015-09-19 ENCOUNTER — Other Ambulatory Visit: Payer: Self-pay | Admitting: Internal Medicine

## 2015-09-19 NOTE — Telephone Encounter (Signed)
08/15/15 

## 2015-09-19 NOTE — Telephone Encounter (Signed)
Left message on machine that rx is ready for pick-up, and it will be at our front desk.  

## 2015-09-24 ENCOUNTER — Encounter: Payer: Self-pay | Admitting: Internal Medicine

## 2015-09-25 ENCOUNTER — Encounter: Payer: Self-pay | Admitting: Pulmonary Disease

## 2015-10-10 ENCOUNTER — Encounter: Payer: Self-pay | Admitting: Internal Medicine

## 2015-10-24 ENCOUNTER — Other Ambulatory Visit: Payer: Self-pay | Admitting: Internal Medicine

## 2015-10-27 ENCOUNTER — Other Ambulatory Visit: Payer: Self-pay | Admitting: Internal Medicine

## 2015-10-27 NOTE — Telephone Encounter (Signed)
09/19/2015 

## 2015-10-28 NOTE — Telephone Encounter (Signed)
Left message on machine that rx is ready for pick-up, and it will be at our front desk.  

## 2015-11-10 ENCOUNTER — Telehealth: Payer: Self-pay | Admitting: Cardiovascular Disease

## 2015-11-10 NOTE — Telephone Encounter (Signed)
°*  STAT* If patient is at the pharmacy, call can be transferred to refill team.   1. Which medications need to be refilled? (please list name of each medication and dose if known) zetia 10 mg po daily   2. Which pharmacy/location (including street and city if local pharmacy) is medication to be sent to? [facility]  3. Do they need a 30 day or 90 day supply? [place]

## 2015-11-11 MED ORDER — EZETIMIBE 10 MG PO TABS: 10.0000 mg | ORAL_TABLET | Freq: Every day | ORAL | Status: DC

## 2015-11-11 NOTE — Telephone Encounter (Signed)
Refill sent for zetia.  

## 2015-11-20 ENCOUNTER — Ambulatory Visit: Payer: BC Managed Care – PPO | Admitting: Pulmonary Disease

## 2015-12-02 ENCOUNTER — Other Ambulatory Visit: Payer: Self-pay | Admitting: Internal Medicine

## 2015-12-03 NOTE — Telephone Encounter (Signed)
Left message on machine that rx is ready for pick-up, and it will be at our front desk.  

## 2015-12-03 NOTE — Telephone Encounter (Signed)
10/27/2015 

## 2016-01-19 ENCOUNTER — Other Ambulatory Visit: Payer: Self-pay | Admitting: Internal Medicine

## 2016-01-19 NOTE — Telephone Encounter (Signed)
12/03/2015 

## 2016-01-20 NOTE — Telephone Encounter (Signed)
Left message on machine that rx is ready for pick-up, and it will be at our front desk.  

## 2016-02-06 ENCOUNTER — Ambulatory Visit (INDEPENDENT_AMBULATORY_CARE_PROVIDER_SITE_OTHER): Payer: BLUE CROSS/BLUE SHIELD | Admitting: Cardiovascular Disease

## 2016-02-06 ENCOUNTER — Encounter: Payer: Self-pay | Admitting: Cardiovascular Disease

## 2016-02-06 VITALS — BP 136/84 | HR 68 | Ht 65.0 in | Wt 211.5 lb

## 2016-02-06 DIAGNOSIS — I10 Essential (primary) hypertension: Secondary | ICD-10-CM

## 2016-02-06 DIAGNOSIS — E785 Hyperlipidemia, unspecified: Secondary | ICD-10-CM

## 2016-02-06 DIAGNOSIS — E1165 Type 2 diabetes mellitus with hyperglycemia: Secondary | ICD-10-CM

## 2016-02-06 DIAGNOSIS — E118 Type 2 diabetes mellitus with unspecified complications: Secondary | ICD-10-CM | POA: Diagnosis not present

## 2016-02-06 DIAGNOSIS — IMO0002 Reserved for concepts with insufficient information to code with codable children: Secondary | ICD-10-CM

## 2016-02-06 DIAGNOSIS — I2510 Atherosclerotic heart disease of native coronary artery without angina pectoris: Secondary | ICD-10-CM

## 2016-02-06 NOTE — Patient Instructions (Signed)
You are doing well. No medication changes were made.  Research praluent and repatha, for cholesterol Injection once every two week for cholesterol, Cuts the numbers in 1/2  Probably could stop the zetia if on the shot  Please call us if you have new issues that need to be addressed before your next appt.  Your physician wants you to follow-up in: 6 months.  You will receive a reminder letter in the mail two months in advance. If you don't receive a letter, please call our office to schedule the follow-up appointment.

## 2016-02-06 NOTE — Progress Notes (Signed)
Patient ID: [name] [name], male    DOB: May 21, 1951, 65 y.o.   MRN: [id number]  HPI Comments: 65 year-old gentleman with obesity, hyperlipidemia, diabetes, hypertension, CAD, s/p CABG. patient of Dr. Silvio Pate, who initially presented for chest pain. Cardiac catheterization showed severe LAD disease and he had bypass surgery November 12 2011. (Coronary artery bypass grafting x3 with the left internal mammary to the left anterior descending coronary artery, reversed saphenous vein graft sequentially to the first and third diagonals with right thigh and the vein harvesting.) He presents for routine followup of his coronary artery disease  He has tried most of the statins before  but was unable to tolerate the medications secondary to myalgias and joint pain  He continues to work with cattle   In follow-up today, he denies any chest pain concerning for angina. 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  Previous lab work, Hemoglobin A1c of 8, Total cholesterol 226  Previously had significant shortness of breath. This seems to have improved. Continues to have  bilateral knee pain.  On prior office visits, reported that After a day in his boots, knees are very sore.    unable to tolerate metoprolol secondary to memory problems and mood disorder. Unable to tolerate carvedilol secondary to fatigue  Previous  Echocardiogram  for his shortness of breath showed normal function, no significant pulmonary hypertension. Normal right ventricular systolic pressure.  Allergies  Allergen Reactions  . Crestor [Rosuvastatin]     Muscle aches.  . Doxazosin Mesylate     REACTION:  didn't work and may have caused SOB  . Doxycycline     REACTION: unspecified  . Glimepiride     REACTION: red eyes  . Glipizide     REACTION: myalgia??  . Losartan Potassium-Hctz     REACTION: chest \\T \ leg pain  . Metoprolol Tartrate     Mood swings, memory changes  . Pravastatin     Muscle aches   . Rosiglitazone Maleate     REACTION: myalgia  . Simvastatin     REACTION: myalgias  . Carvedilol     Felt bad    Outpatient Encounter Prescriptions as of 02/06/2016  Medication Sig  . aspirin 81 MG tablet Take 2 tablets (162 mg total) by mouth daily.  Marland Kitchen ezetimibe (ZETIA) 10 MG tablet Take 1 tablet (10 mg total) by mouth daily.  . furosemide (LASIX) 40 MG tablet TAKE 1 TABLET TWICE A DAY AS NEEDED  . glucose blood (FREESTYLE LITE) test strip Use as instructed, patient tests once daily. Dx: 250.00  . HYDROcodone-acetaminophen (NORCO/VICODIN) 5-325 MG tablet TAKE 1 TABLET BY MOUTH TWICE A DAY AS NEEDED FOR PAIN.  Marland Kitchen lisinopril (PRINIVIL,ZESTRIL) 10 MG tablet TAKE 1 TABLET DAILY  . metFORMIN (GLUCOPHAGE) 1000 MG tablet TAKE ONE-HALF TABLET BY MOUTH TWICE DAILY WITH MEALS  . pioglitazone (ACTOS) 30 MG tablet TAKE 1 TABLET DAILY  . potassium chloride SA (K-DUR,KLOR-CON) 20 MEQ tablet Take 20 mEq by mouth 2 (two) times daily as needed.  . triamcinolone cream (KENALOG) 0.1 % Apply 1 application topically 2 (two) times daily as needed.   No facility-administered encounter medications on file as of 02/06/2016.  Past Medical History  Diagnosis Date  . BPH (benign prostatic hypertrophy)   . Hx of colonic polyps   . Hyperlipidemia   . Hypertension   . Cerebral aneurysm   . Carpal tunnel syndrome   . Arthritis   . Kidney stones   . Angina   . Shortness of breath   . OSA (obstructive sleep apnea)     uses cpap  . GERD (gastroesophageal reflux disease)   . Diabetes mellitus     type 2  . Coronary artery disease 11/12/11    s/p CABG x 3    Past Surgical History  Procedure  Laterality Date  . Knee surgery      left  . Meniscus repair  03/08    right knee  . Hernia repair    . Coronary artery bypass graft  11/12/2011    Procedure: CORONARY ARTERY BYPASS GRAFTING (CABG);  Surgeon: Grace Isaac, MD;  Location: [facility];  Service: Open Heart Surgery;  Laterality: N/A;  Coronary Artery Bypass Graft times three on pump utilizing left internal mammary artery and right saphenous vein harvested endoscopically     Social History  reports that he has never smoked. He has never used smokeless tobacco. He reports that he does not drink alcohol or use illicit drugs.  Family History family history includes Coronary artery disease in his father; Prostate cancer in his father. There is no history of Diabetes or Hypertension.     Review of Systems  Constitutional: Negative.   Respiratory: Negative.   Cardiovascular: Negative.   Gastrointestinal: Negative.   Musculoskeletal: Positive for arthralgias.  Neurological: Negative.   Hematological: Negative.   Psychiatric/Behavioral: Negative.   All other systems reviewed and are negative.  BP 136/84 mmHg  Pulse 68  Ht 5\' 5"  (1.651 m)  Wt 211 lb 8 oz (95.936 kg)  BMI 35.20 kg/m2  Physical Exam  Constitutional: He is oriented to person, place, and time. He appears well-developed and well-nourished.  Obese  HENT:  Head: Normocephalic.  Nose: Nose normal.  Mouth/Throat: Oropharynx is clear and moist.  Eyes: Conjunctivae are normal. Pupils are equal, round, and reactive to light.  Neck: Normal range of motion. Neck supple. No JVD present.  Cardiovascular: Normal rate, regular rhythm, S1 normal, S2 normal, normal heart sounds and intact distal pulses.  Exam reveals no gallop and no friction rub.   No murmur heard. Pulmonary/Chest: Effort normal and breath sounds normal. No respiratory distress. He has no wheezes. He has no rales. He exhibits no tenderness.  Abdominal: Soft. Bowel sounds are normal. He exhibits no  distension. There is no tenderness.  Musculoskeletal: Normal range of motion. He exhibits no edema or tenderness.  Lymphadenopathy:    He has no cervical adenopathy.  Neurological: He is alert and oriented to person, place, and time. Coordination normal.  Skin: Skin is warm and dry. No rash noted. No erythema.  Psychiatric: He has a normal mood and affect. His behavior is normal. Judgment and thought content normal.      Assessment and Plan   Nursing note and vitals reviewed.

## 2016-02-06 NOTE — Assessment & Plan Note (Signed)
We have stressed the importance of aggressive diabetes control given his underlying coronary artery disease. Recommended a low carbohydrate diet

## 2016-02-06 NOTE — Assessment & Plan Note (Signed)
Blood pressure is well controlled on today's visit. No changes made to the medications. 

## 2016-02-06 NOTE — Assessment & Plan Note (Signed)
Long discussion today with him concerning his cholesterol We have recommended a repeat lipid panel when he sees Dr. Silvio Pate for his annual visit If LDL continues to run above goal (in particular if it runs 100 or more), he would be a candidate for one of the newer agents such as praluent or repatha. These medications were discussed with him in detail

## 2016-02-21 ENCOUNTER — Other Ambulatory Visit: Payer: Self-pay | Admitting: Internal Medicine

## 2016-02-23 NOTE — Telephone Encounter (Signed)
Tried to reach patient to advise rx was up front for pick up but there was no voice mail and no answer.

## 2016-02-23 NOTE — Telephone Encounter (Signed)
Spoke to wife

## 2016-02-23 NOTE — Telephone Encounter (Signed)
Last Rx 01-20-16 Last OV 09-12-15 Next OV 03-08-16

## 2016-02-23 NOTE — Telephone Encounter (Signed)
Tried calling again. No answer.

## 2016-03-08 ENCOUNTER — Encounter: Payer: Self-pay | Admitting: Internal Medicine

## 2016-03-08 ENCOUNTER — Ambulatory Visit (INDEPENDENT_AMBULATORY_CARE_PROVIDER_SITE_OTHER): Payer: BLUE CROSS/BLUE SHIELD | Admitting: Internal Medicine

## 2016-03-08 VITALS — BP 120/70 | HR 71 | Temp 98.3°F | Ht 65.0 in | Wt 213.0 lb

## 2016-03-08 DIAGNOSIS — Z1159 Encounter for screening for other viral diseases: Secondary | ICD-10-CM

## 2016-03-08 DIAGNOSIS — Z1211 Encounter for screening for malignant neoplasm of colon: Secondary | ICD-10-CM

## 2016-03-08 DIAGNOSIS — IMO0002 Reserved for concepts with insufficient information to code with codable children: Secondary | ICD-10-CM

## 2016-03-08 DIAGNOSIS — Z23 Encounter for immunization: Secondary | ICD-10-CM

## 2016-03-08 DIAGNOSIS — Z0000 Encounter for general adult medical examination without abnormal findings: Secondary | ICD-10-CM | POA: Diagnosis not present

## 2016-03-08 DIAGNOSIS — E118 Type 2 diabetes mellitus with unspecified complications: Secondary | ICD-10-CM

## 2016-03-08 DIAGNOSIS — E1165 Type 2 diabetes mellitus with hyperglycemia: Secondary | ICD-10-CM | POA: Diagnosis not present

## 2016-03-08 DIAGNOSIS — I2510 Atherosclerotic heart disease of native coronary artery without angina pectoris: Secondary | ICD-10-CM | POA: Diagnosis not present

## 2016-03-08 DIAGNOSIS — M170 Bilateral primary osteoarthritis of knee: Secondary | ICD-10-CM | POA: Diagnosis not present

## 2016-03-08 LAB — LIPID PANEL
Cholesterol: 146 mg/dL (ref 0–200)
HDL: 39.6 mg/dL (ref >39.00)
NONHDL: 106.02
TRIGLYCERIDES: 228 mg/dL — AB (ref 0.0–149.0)
Total CHOL/HDL Ratio: 4
VLDL: 45.6 mg/dL — ABNORMAL HIGH (ref 0.0–40.0)

## 2016-03-08 LAB — CBC WITH DIFFERENTIAL/PLATELET
BASOS ABS: 0.1 10*3/uL (ref 0.0–0.1)
Basophils Relative: 1.1 % (ref 0.0–3.0)
Eosinophils Absolute: 0.2 10*3/uL (ref 0.0–0.7)
Eosinophils Relative: 3.2 % (ref 0.0–5.0)
HCT: 42.2 % (ref 39.0–52.0)
Hemoglobin: 14.1 g/dL (ref 13.0–17.0)
LYMPHS ABS: 2 10*3/uL (ref 0.7–4.0)
Lymphocytes Relative: 35.8 % (ref 12.0–46.0)
MCHC: 33.5 g/dL (ref 30.0–36.0)
MCV: 88.5 fl (ref 78.0–100.0)
MONO ABS: 0.6 10*3/uL (ref 0.1–1.0)
MONOS PCT: 10 % (ref 3.0–12.0)
NEUTROS ABS: 2.8 10*3/uL (ref 1.4–7.7)
NEUTROS PCT: 49.9 % (ref 43.0–77.0)
PLATELETS: 273 10*3/uL (ref 150.0–400.0)
RBC: 4.76 Mil/uL (ref 4.22–5.81)
RDW: 14.1 % (ref 11.5–15.5)
WBC: 5.5 10*3/uL (ref 4.0–10.5)

## 2016-03-08 LAB — COMPREHENSIVE METABOLIC PANEL
ALK PHOS: 82 U/L (ref 39–117)
ALT: 16 U/L (ref 0–53)
AST: 14 U/L (ref 0–37)
Albumin: 4.1 g/dL (ref 3.5–5.2)
BILIRUBIN TOTAL: 0.3 mg/dL (ref 0.2–1.2)
BUN: 11 mg/dL (ref 6–23)
CO2: 28 meq/L (ref 19–32)
CREATININE: 0.82 mg/dL (ref 0.40–1.50)
Calcium: 9.2 mg/dL (ref 8.4–10.5)
Chloride: 102 mEq/L (ref 96–112)
GFR: 100.09 mL/min (ref >60.00)
GLUCOSE: 165 mg/dL — AB (ref 70–99)
Potassium: 3.7 mEq/L (ref 3.5–5.1)
SODIUM: 138 meq/L (ref 135–145)
TOTAL PROTEIN: 6.6 g/dL (ref 6.0–8.3)

## 2016-03-08 LAB — LDL CHOLESTEROL, DIRECT: Direct LDL: 73 mg/dL

## 2016-03-08 LAB — HM DIABETES FOOT EXAM

## 2016-03-08 LAB — HEMOGLOBIN A1C: HEMOGLOBIN A1C: 8.3 % — AB (ref 4.6–6.5)

## 2016-03-08 LAB — T4, FREE: FREE T4: 0.96 ng/dL (ref 0.60–1.60)

## 2016-03-08 NOTE — Addendum Note (Signed)
Addended by: Pilar Grammes on: 03/08/2016 11:27 AM   Modules accepted: Orders

## 2016-03-08 NOTE — Progress Notes (Signed)
Subjective:    Patient ID: [name] [name], male    DOB: June 10, 1951, 65 y.o.   MRN: [id number]  HPI Here for physical  Having bad time with his knees in the past week Has been building a deck--may have brought it on Still works with cattle Gets relief from the hydrocodone--uses 1-2 per day. Adds ibuprofen occasionally  Hasn't "felt good for a couple of days" Achy--tired.  No respiratory symptoms  Checks sugars occasionally, 135-150 in AM Using OTC diabetes supplement "done with diabetes" No numbness or sores in feet. Occasional shooting pain Has appt for eye exam  No recent heart problems  Current Outpatient Prescriptions on File Prior to Visit  Medication Sig Dispense Refill  . aspirin 81 MG tablet Take 2 tablets (162 mg total) by mouth daily.    Marland Kitchen ezetimibe (ZETIA) 10 MG tablet Take 1 tablet (10 mg total) by mouth daily. 90 tablet 3  . furosemide (LASIX) 40 MG tablet TAKE 1 TABLET TWICE A DAY AS NEEDED 180 tablet 3  . glucose blood (FREESTYLE LITE) test strip Use as instructed, patient tests once daily. Dx: 250.00 300 each 3  . HYDROcodone-acetaminophen (NORCO/VICODIN) 5-325 MG tablet TAKE ONE TABLET BY MOUTH TWICE DAILY AS NEEDED FOR PAIN 60 tablet 0  . lisinopril (PRINIVIL,ZESTRIL) 10 MG tablet TAKE 1 TABLET DAILY 90 tablet 3  . metFORMIN (GLUCOPHAGE) 1000 MG tablet TAKE ONE-HALF TABLET BY MOUTH TWICE DAILY WITH MEALS 180 tablet 3  . pioglitazone (ACTOS) 30 MG tablet TAKE 1 TABLET DAILY 90 tablet 3  . potassium chloride SA (K-DUR,KLOR-CON) 20 MEQ tablet Take 20 mEq by mouth 2 (two) times daily as needed.    . triamcinolone cream (KENALOG) 0.1 % Apply 1 application topically 2 (two) times daily as needed. 30 g 0   No current facility-administered medications on file prior to visit.    Allergies  Allergen Reactions  . Crestor [Rosuvastatin]     Muscle aches.  . Doxazosin Mesylate     REACTION: didn't work and may have caused SOB  . Doxycycline     REACTION:  unspecified  . Glimepiride     REACTION: red eyes  . Glipizide     REACTION: myalgia??  . Losartan Potassium-Hctz     REACTION: chest \\T \ leg pain  . Metoprolol Tartrate     Mood swings, memory changes  . Pravastatin     Muscle aches   . Rosiglitazone Maleate     REACTION: myalgia  . Simvastatin     REACTION: myalgias  . Carvedilol     Felt bad    Past Medical History  Diagnosis Date  . BPH (benign prostatic hypertrophy)   . Hx of colonic polyps   . Hyperlipidemia   . Hypertension   . Cerebral aneurysm   . Carpal tunnel syndrome   . Arthritis   . Kidney stones   . Angina   . Shortness of breath   . OSA (obstructive sleep apnea)     uses cpap  . GERD (gastroesophageal reflux disease)   . Diabetes mellitus     type 2  . Coronary artery disease 11/12/11    s/p CABG x 3    Past Surgical History  Procedure Laterality Date  . Knee surgery      left  . Meniscus repair  03/08    right knee  . Hernia repair    . Coronary artery bypass graft  11/12/2011    Procedure: CORONARY ARTERY BYPASS GRAFTING (CABG);  Surgeon: Grace Isaac, MD;  Location: [facility];  Service: Open Heart Surgery;  Laterality: N/A;  Coronary Artery Bypass Graft times three on pump utilizing left internal mammary artery and right saphenous vein harvested endoscopically     Family History  Problem Relation Age of Onset  . Coronary artery disease Father   . Diabetes Neg Hx   . Hypertension Neg Hx   . Prostate cancer Father     Social History   Social History  . Marital Status: Married    Spouse Name: N/A  . Number of Children: 3  . Years of Education: N/A   Occupational History  . tool maker    Social History Main Topics  . Smoking status: Never Smoker   . Smokeless tobacco: Never Used  . Alcohol Use: No  . Drug Use: No  . Sexual Activity: Yes   Other Topics Concern  . Not on file   Social History Narrative   Review of Systems  Constitutional: Negative for unexpected weight  change.       Wears seat belt  HENT: Negative for hearing loss and tinnitus.        New dentures on top after pulling last 5 teeth--full Bottom is partial  Eyes: Positive for visual disturbance.       No diplopia or unilateral vision loss---having some eye strain though  Respiratory: Negative for cough, chest tightness and shortness of breath.   Cardiovascular: Positive for palpitations. Negative for chest pain and leg swelling.       Occ skip beat  Gastrointestinal: Negative for nausea, vomiting, abdominal pain, constipation and blood in stool.       No heartburn  Endocrine: Negative for polydipsia and polyuria.  Genitourinary: Positive for urgency. Negative for difficulty urinating.       Urgency with diuretic ED---cialis helps some  Musculoskeletal: Positive for back pain, joint swelling and arthralgias.       Pulled low back  Skin:       Rash behind ear---cream helps (controls) Has sore spots in both preauricular areas  Allergic/Immunologic: Positive for environmental allergies. Negative for immunocompromised state.       No meds  Neurological: Negative for dizziness, syncope, weakness, light-headedness, numbness and headaches.       Legs weak in the past few days  Hematological: Negative for adenopathy. Bruises/bleeds easily.  Psychiatric/Behavioral: Negative for sleep disturbance and dysphoric mood. The patient is not nervous/anxious.        Objective:   Physical Exam  Constitutional: He is oriented to person, place, and time. He appears well-developed and well-nourished. No distress.  HENT:  Head: Normocephalic and atraumatic.  Right Ear: External ear normal.  Left Ear: External ear normal.  Mouth/Throat: Oropharynx is clear and moist. No oropharyngeal exudate.  Eyes: Conjunctivae are normal. Pupils are equal, round, and reactive to light.  Neck: Normal range of motion. Neck supple. No thyromegaly present.  Cardiovascular: Normal rate, regular rhythm, normal heart  sounds and intact distal pulses.  Exam reveals no gallop.   No murmur heard. Pulmonary/Chest: Effort normal and breath sounds normal. No respiratory distress. He has no wheezes. He has no rales.  Abdominal: Soft. There is no tenderness.  Musculoskeletal: He exhibits no edema or tenderness.  Lymphadenopathy:    He has no cervical adenopathy.  Neurological: He is alert and oriented to person, place, and time.  Normal sensation in feet  Skin:  No foot lesions Mild hyperpigmented lesions in preauricular areas--no actinics  Psychiatric:  He has a normal mood and affect. His behavior is normal.          Assessment & Plan:

## 2016-03-08 NOTE — Assessment & Plan Note (Signed)
Discussed fitness Td booster today and prevnar Still doesn't want colonoscopy despite past polyp--will do FIT Defer PSA till at least next year

## 2016-03-08 NOTE — Assessment & Plan Note (Signed)
Has CAD If control not better ---close to or under 8%---will add SGPLT2 med

## 2016-03-08 NOTE — Progress Notes (Signed)
Pre visit review using our clinic review tool, if applicable. No additional management support is needed unless otherwise documented below in the visit note. 

## 2016-03-08 NOTE — Assessment & Plan Note (Signed)
May need to go back to ortho

## 2016-03-08 NOTE — Assessment & Plan Note (Signed)
Has been quiet Follows with Dr Rockey Situ

## 2016-03-09 LAB — HEPATITIS C ANTIBODY: HCV Ab: NEGATIVE

## 2016-03-10 ENCOUNTER — Other Ambulatory Visit (INDEPENDENT_AMBULATORY_CARE_PROVIDER_SITE_OTHER): Payer: BLUE CROSS/BLUE SHIELD

## 2016-03-10 DIAGNOSIS — Z0000 Encounter for general adult medical examination without abnormal findings: Secondary | ICD-10-CM

## 2016-03-12 LAB — NICOTINE/COTININE METABOLITES

## 2016-03-29 ENCOUNTER — Other Ambulatory Visit: Payer: Self-pay | Admitting: Internal Medicine

## 2016-03-29 NOTE — Telephone Encounter (Signed)
Left message at home number per DPR. RX up front for pickup

## 2016-03-29 NOTE — Telephone Encounter (Signed)
Last filled 02-23-16 #60 Last OV 03-08-16 Next OV 09-08-16

## 2016-03-29 NOTE — Telephone Encounter (Signed)
Approved: #[id number] x 0 

## 2016-03-29 NOTE — Telephone Encounter (Signed)
RX printed and waiting for signature

## 2016-04-08 ENCOUNTER — Other Ambulatory Visit: Payer: BLUE CROSS/BLUE SHIELD

## 2016-04-09 ENCOUNTER — Other Ambulatory Visit (INDEPENDENT_AMBULATORY_CARE_PROVIDER_SITE_OTHER): Payer: BLUE CROSS/BLUE SHIELD

## 2016-04-09 DIAGNOSIS — Z1211 Encounter for screening for malignant neoplasm of colon: Secondary | ICD-10-CM | POA: Diagnosis not present

## 2016-04-09 LAB — FECAL OCCULT BLOOD, IMMUNOCHEMICAL: Fecal Occult Bld: NEGATIVE

## 2016-05-06 ENCOUNTER — Other Ambulatory Visit: Payer: Self-pay | Admitting: Internal Medicine

## 2016-05-07 NOTE — Telephone Encounter (Signed)
Last filled 04-01-16 #60 Last OV 03-08-16 Next OV 09-08-16

## 2016-05-07 NOTE — Telephone Encounter (Signed)
Left message on voice mail per DPR that rx was ready for pickup

## 2016-06-14 ENCOUNTER — Other Ambulatory Visit: Payer: Self-pay

## 2016-06-14 MED ORDER — HYDROCODONE-ACETAMINOPHEN 5-325 MG PO TABS: ORAL_TABLET | ORAL | Status: DC

## 2016-06-14 NOTE — Telephone Encounter (Signed)
Last filled 05-10-16 #60 Last OV 03-08-16 NO Future OV

## 2016-06-14 NOTE — Telephone Encounter (Signed)
Spoke to pt. RX up front for pickup 

## 2016-07-16 ENCOUNTER — Other Ambulatory Visit: Payer: Self-pay

## 2016-07-16 MED ORDER — HYDROCODONE-ACETAMINOPHEN 5-325 MG PO TABS: ORAL_TABLET | ORAL | 0 refills | Status: DC

## 2016-07-16 NOTE — Telephone Encounter (Signed)
Left message on VM per DPR that rx was up front ready for pickup

## 2016-07-16 NOTE — Telephone Encounter (Signed)
Last filled 06-15-16 Last OV 03-08-16 Next OV 09-08-16

## 2016-08-26 ENCOUNTER — Other Ambulatory Visit: Payer: Self-pay

## 2016-08-26 MED ORDER — HYDROCODONE-ACETAMINOPHEN 5-325 MG PO TABS: ORAL_TABLET | ORAL | 0 refills | Status: DC

## 2016-08-26 NOTE — Telephone Encounter (Signed)
Left message on vm per DPR that rx was up front.

## 2016-08-26 NOTE — Telephone Encounter (Signed)
Last Filled 07-19-16 #60 Last OV 03-08-16 Next OV 09-08-16

## 2016-09-05 ENCOUNTER — Other Ambulatory Visit: Payer: Self-pay | Admitting: Cardiovascular Disease

## 2016-09-08 ENCOUNTER — Ambulatory Visit: Payer: BLUE CROSS/BLUE SHIELD | Admitting: Internal Medicine

## 2016-09-10 ENCOUNTER — Ambulatory Visit (INDEPENDENT_AMBULATORY_CARE_PROVIDER_SITE_OTHER): Payer: BLUE CROSS/BLUE SHIELD | Admitting: Cardiovascular Disease

## 2016-09-10 ENCOUNTER — Encounter: Payer: Self-pay | Admitting: Cardiovascular Disease

## 2016-09-10 VITALS — BP 140/80 | HR 66 | Ht 65.0 in | Wt 215.5 lb

## 2016-09-10 DIAGNOSIS — IMO0002 Reserved for concepts with insufficient information to code with codable children: Secondary | ICD-10-CM

## 2016-09-10 DIAGNOSIS — I2510 Atherosclerotic heart disease of native coronary artery without angina pectoris: Secondary | ICD-10-CM

## 2016-09-10 DIAGNOSIS — I10 Essential (primary) hypertension: Secondary | ICD-10-CM

## 2016-09-10 DIAGNOSIS — Z951 Presence of aortocoronary bypass graft: Secondary | ICD-10-CM

## 2016-09-10 DIAGNOSIS — E7800 Pure hypercholesterolemia, unspecified: Secondary | ICD-10-CM | POA: Diagnosis not present

## 2016-09-10 DIAGNOSIS — G4733 Obstructive sleep apnea (adult) (pediatric): Secondary | ICD-10-CM

## 2016-09-10 DIAGNOSIS — E1165 Type 2 diabetes mellitus with hyperglycemia: Secondary | ICD-10-CM

## 2016-09-10 DIAGNOSIS — E118 Type 2 diabetes mellitus with unspecified complications: Secondary | ICD-10-CM

## 2016-09-10 NOTE — Progress Notes (Signed)
Cardiology Office Note  Date:  09/10/2016   ID:  [name] [name], DOB 1951-09-11, MRN [id number]  PCP:  Viviana Simpler, MD   Chief Complaint  Patient presents with  . other    6 month follow up. Meds reviewed by the pt. verbally. Pt. stopped the zetia due to muscle aches.     HPI:  65 year-old gentleman with obesity, hyperlipidemia, diabetes, hypertension,  Who presents today for follow-up of his CAD, s/p CABG. patient of Dr. Silvio Pate, who initially presented for chest pain. Cardiac catheterization showed severe LAD disease and he had bypass surgery November 12 2011. (Coronary artery bypass grafting x3 with the left internal mammary to the left anterior descending coronary artery, reversed saphenous vein graft sequentially to the first and third diagonals with right thigh and the vein harvesting.) He presents for routine followup of his coronary artery disease  He has tried most of the statins before  but was unable to tolerate the medications secondary to myalgias and joint pain  In follow-up, he reports that he is doing well Having some Back trouble through much of the summer Sometimes with discomfort across his shoulders bilaterally, seems to calm on at rest Lower leg and foot pain on the right, in evening with leg up and in bed Pain relieved with ibuprofen that he takes as needed  Was previously tried on zetia but had Side effects, stop the medication Reports he had general malaise  Lab work reviewed with him HBA1C 8.3  He continues to work with cattle  no regular exercise program 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 This seems to  have improved. Continues to have  bilateral knee pain.  On prior office visits, reported that After a day in his boots, knees are very sore.    unable to tolerate metoprolol secondary to memory problems and mood disorder. Unable to tolerate carvedilol secondary to fatigue  Previous  Echocardiogram  for his shortness of breath showed normal function, no significant pulmonary hypertension. Normal right ventricular systolic pressure.  PMH:   has a past medical history of Angina; Arthritis; BPH (benign prostatic hypertrophy); Carpal tunnel syndrome; Cerebral aneurysm; Coronary artery disease (11/12/11); Diabetes mellitus; GERD (gastroesophageal reflux disease); colonic polyps; Hyperlipidemia; Hypertension; Kidney stones; OSA (obstructive sleep apnea); and Shortness of breath.  PSH:    Past Surgical History:  Procedure Laterality Date  . CORONARY ARTERY BYPASS GRAFT  11/12/2011   Procedure: CORONARY ARTERY BYPASS GRAFTING (CABG);  Surgeon: Grace Isaac, MD;  Location: [facility];  Service: Open Heart Surgery;  Laterality: N/A;  Coronary Artery Bypass Graft times three on pump utilizing left internal mammary artery and right saphenous vein harvested endoscopically   . HERNIA REPAIR    . KNEE SURGERY     left  . MENISCUS REPAIR  03/08   right knee    Current Outpatient Prescriptions  Medication Sig Dispense Refill  . aspirin 81 MG tablet Take 2 tablets (162 mg total) by mouth daily.    . furosemide (LASIX) 40 MG tablet TAKE 1 TABLET TWICE A DAY AS NEEDED 180 tablet 0  . glucose blood (FREESTYLE LITE) test strip Use as instructed, patient tests once daily.  Dx: 250.00 300 each 3  . HYDROcodone-acetaminophen (NORCO/VICODIN) 5-325 MG tablet TAKE 1 TABLET BY MOUTH TWICE A DAY AS NEEDED FOR PAIN. 60 tablet 0  . lisinopril (PRINIVIL,ZESTRIL) 10 MG tablet TAKE 1 TABLET DAILY 90 tablet 0  . metFORMIN (GLUCOPHAGE) 1000 MG tablet TAKE ONE-HALF TABLET BY MOUTH TWICE DAILY WITH MEALS 180 tablet 3  . OVER  THE COUNTER MEDICATION 1 capsule.    . pioglitazone (ACTOS) 30 MG tablet TAKE 1 TABLET DAILY 90 tablet 3  . potassium chloride SA (K-DUR,KLOR-CON) 20 MEQ tablet Take 20 mEq by mouth 2 (two) times daily as needed.    . triamcinolone cream (KENALOG) 0.1 % Apply 1 application topically 2 (two) times daily as needed. 30 g 0   No current facility-administered medications for this visit.      Allergies:   Crestor [rosuvastatin]; Doxazosin mesylate; Doxycycline; Glimepiride; Glipizide; Losartan potassium-hctz; Metoprolol tartrate; Pravastatin; Rosiglitazone maleate; Simvastatin; and Carvedilol   Social History:  The patient  reports that he has never smoked. He has never used smokeless tobacco. He reports that he does not drink alcohol or use drugs.   Family History:   family history includes Coronary artery disease in his father; Prostate cancer in his father.    Review of Systems: Review of Systems  Constitutional: Negative.   Respiratory: Negative.   Cardiovascular: Negative.   Gastrointestinal: Negative.   Musculoskeletal: Positive for back pain and joint pain.       Right leg pain  Neurological: Negative.   Psychiatric/Behavioral: Negative.   All other systems reviewed and are negative.    PHYSICAL EXAM: VS:  BP 140/80 (BP Location: Left Arm, Patient Position: Sitting, Cuff Size: Normal)   Pulse 66   Ht 5\' 5"  (1.651 m)   Wt 215 lb 8 oz (97.8 kg)   BMI 35.86 kg/m  , BMI Body mass index is 35.86 kg/m. GEN: Well nourished, well developed, in no acute distress , obese HEENT: normal  Neck: no JVD, carotid bruits, or masses Cardiac: RRR; no murmurs, rubs, or gallops,no edema  Respiratory:  clear to auscultation bilaterally, normal work of breathing GI: soft, nontender, nondistended, + BS MS: no deformity or atrophy  Skin: warm and dry, no rash Neuro:  Strength and sensation are intact Psych: euthymic mood, full affect    Recent Labs: 03/08/2016: ALT 16; BUN 11; Creatinine,  Ser 0.82; Hemoglobin 14.1; Platelets 273.0; Potassium 3.7; Sodium 138    Lipid Panel Lab Results  Component Value Date   CHOL 146 03/08/2016   HDL 39.60 03/08/2016   LDLCALC 106 (H) 03/04/2015   TRIG 228.0 (H) 03/08/2016      Wt Readings from Last 3 Encounters:  09/10/16 215 lb 8 oz (97.8 kg)  03/08/16 213 lb (96.6 kg)  02/06/16 211 lb 8 oz (95.9 kg)       ASSESSMENT AND PLAN:  Essential hypertension - Plan: EKG 12-Lead Blood pressure is well controlled on today's visit. No changes made to the medications.  Coronary artery disease involving native coronary artery of native heart without angina pectoris - Plan: EKG 12-Lead Atypical shoulder pain, likely musculoskeletal. Currently with no symptoms of angina. No further workup at this time. Continue current medication regimen.  Pure hypercholesterolemia Cholesterol elevated, unable to tolerate statins or even zetia Long discussion concerning various new medication such as repatha and praluent Delivery pen shown to him, literature provided. Discussed mechanism of the medications. Weight for repeat lipid panel, then he can decide if he would like to  pursue this  OSA (obstructive sleep apnea) Recommended weight loss  Uncontrolled type 2 diabetes mellitus with complication, without long-term current use of insulin (HCC) Hemoglobin A1c of 8 Recommended low bread, carbohydrates, weight loss  S/P CABG (coronary artery bypass graft) No angina, no further testing at this time   Total encounter time more than 25 minutes  Greater than 50% was spent in counseling and coordination of care with the patient    Disposition:   F/U  6 months   Orders Placed This Encounter  Procedures  . EKG 12-Lead     Signed, Esmond Plants, M.D., Ph.D. 09/10/2016  [place], [place]  \

## 2016-09-10 NOTE — Patient Instructions (Addendum)
Medication Instructions:   No medication changes made  Read about repatha or praluent, for low cholesterol  Labwork:  No new labs needed  Testing/Procedures:  No further testing at this time   Follow-Up: It was a pleasure seeing you in the office today. Please call us if you have new issues that need to be addressed before your next appt.  [phone]  Your physician wants you to follow-up in: 6 months.  You will receive a reminder letter in the mail two months in advance. If you don't receive a letter, please call our office to schedule the follow-up appointment.  If you need a refill on your cardiac medications before your next appointment, please call your pharmacy.

## 2016-09-15 ENCOUNTER — Encounter: Payer: Self-pay | Admitting: Internal Medicine

## 2016-09-15 ENCOUNTER — Ambulatory Visit (INDEPENDENT_AMBULATORY_CARE_PROVIDER_SITE_OTHER): Payer: BLUE CROSS/BLUE SHIELD | Admitting: Internal Medicine

## 2016-09-15 VITALS — BP 130/80 | HR 72 | Temp 98.3°F | Wt 216.0 lb

## 2016-09-15 DIAGNOSIS — E1165 Type 2 diabetes mellitus with hyperglycemia: Secondary | ICD-10-CM

## 2016-09-15 DIAGNOSIS — E118 Type 2 diabetes mellitus with unspecified complications: Secondary | ICD-10-CM | POA: Diagnosis not present

## 2016-09-15 DIAGNOSIS — IMO0002 Reserved for concepts with insufficient information to code with codable children: Secondary | ICD-10-CM

## 2016-09-15 LAB — HEMOGLOBIN A1C: HEMOGLOBIN A1C: 9 % — AB (ref 4.6–6.5)

## 2016-09-15 NOTE — Assessment & Plan Note (Addendum)
Will add SGLT inhibitor (depending on insurance) if still >8% Try to get eye report No clear neuropathy (just right foot pain) Ongoing vascular disease

## 2016-09-15 NOTE — Progress Notes (Signed)
Subjective:    Patient ID: [name] [name], male    DOB: May 10, 1951, 65 y.o.   MRN: [id number]  HPI Here for follow up of diabetes and other chronic health conditions  Doing okay He has no new concerns Checks sugars rarely Did have his eye exam---- he will try to get a report ([facility] in [place]) No hypoglycemic reactions Does have some right foot pain--discussed with Dr Rockey Situ (not vascular) Pain at night--- mostly when at rest. 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 aspirin 81 MG tablet Take 2 tablets (162 mg total) by mouth daily.    . furosemide (LASIX) 40 MG tablet TAKE 1 TABLET TWICE A DAY AS NEEDED 180 tablet 0  . glucose blood (FREESTYLE LITE) test strip Use as instructed, patient tests once daily. Dx: 250.00 300 each 3  . HYDROcodone-acetaminophen (NORCO/VICODIN) 5-325 MG tablet TAKE 1 TABLET BY MOUTH TWICE A DAY AS NEEDED FOR PAIN. 60 tablet 0  . lisinopril (PRINIVIL,ZESTRIL) 10 MG tablet TAKE 1 TABLET DAILY 90 tablet 0  . metFORMIN (GLUCOPHAGE) 1000 MG tablet TAKE ONE-HALF TABLET BY MOUTH TWICE DAILY WITH MEALS 180 tablet 3  . pioglitazone (ACTOS) 30 MG tablet TAKE 1 TABLET DAILY 90 tablet 3  . potassium chloride SA (K-DUR,KLOR-CON) 20 MEQ tablet Take 20 mEq by mouth 2 (two) times daily as needed.    . triamcinolone cream (KENALOG) 0.1 % Apply 1 application topically 2 (two) times daily as needed. 30 g 0   No current facility-administered medications on file prior to visit.     Allergies  Allergen Reactions  . Crestor [Rosuvastatin]     Muscle aches.  . Doxazosin Mesylate     REACTION: didn't work and may have caused SOB  . Doxycycline     REACTION: unspecified  . Glimepiride     REACTION: red eyes  . Glipizide     REACTION: myalgia??  . Losartan Potassium-Hctz     REACTION: chest \\T \ leg pain  . Metoprolol  Tartrate     Mood swings, memory changes  . Pravastatin     Muscle aches   . Rosiglitazone Maleate     REACTION: myalgia  . Simvastatin     REACTION: myalgias  . Carvedilol     Felt bad    Past Medical History:  Diagnosis Date  . Angina   . Arthritis   . BPH (benign prostatic hypertrophy)   . Carpal tunnel syndrome   . Cerebral aneurysm   . Coronary artery disease 11/12/11   s/p CABG x 3  . Diabetes mellitus    type 2  . GERD (gastroesophageal reflux disease)   . Hx of colonic polyps   . Hyperlipidemia   . Hypertension   . Kidney stones   . OSA (obstructive sleep apnea)    uses cpap  . Shortness of breath     Past Surgical History:  Procedure Laterality Date  . CORONARY ARTERY BYPASS GRAFT  11/12/2011   Procedure: CORONARY ARTERY BYPASS GRAFTING (CABG);  Surgeon: Grace Isaac, MD;  Location: [facility];  Service: Open Heart Surgery;  Laterality: N/A;  Coronary Artery Bypass Graft times three on pump utilizing left internal mammary artery and right saphenous vein harvested endoscopically   . HERNIA REPAIR    . KNEE SURGERY     left  . MENISCUS REPAIR  03/08   right knee    Family History  Problem Relation Age of Onset  . Coronary artery disease Father   . Prostate cancer Father   . Diabetes Neg Hx   . Hypertension Neg Hx     Social History   Social History  . Marital status: Married    Spouse name: N/A  . Number of children: 3  . Years of education: N/A   Occupational History  . tool maker    Social History Main Topics  . Smoking status: Never Smoker  . Smokeless tobacco: Never Used  . Alcohol use No  . Drug use: No  . Sexual activity: Yes   Other Topics Concern  . Not on file   Social History Narrative  . No narrative on file   Review of Systems  Appetite is okay Weight is stable Generally sleeps okay     Objective:   Physical Exam  Constitutional: He appears well-developed and well-nourished. No distress.  Neck: Normal range of  motion. Neck supple. No thyromegaly present.  Cardiovascular: Normal rate, regular rhythm, normal heart sounds and intact distal pulses.  Exam reveals no gallop.   No murmur heard. Pulmonary/Chest: Effort normal and breath sounds normal. No respiratory distress. He has no wheezes. He has no rales.  Musculoskeletal: He exhibits no edema.  Lymphadenopathy:    He has no cervical adenopathy.  Skin:  No foot lesions          Assessment & Plan:

## 2016-09-15 NOTE — Progress Notes (Signed)
Pre visit review using our clinic review tool, if applicable. No additional management support is needed unless otherwise documented below in the visit note. 

## 2016-09-16 ENCOUNTER — Other Ambulatory Visit: Payer: Self-pay | Admitting: Internal Medicine

## 2016-09-16 MED ORDER — CANAGLIFLOZIN 300 MG PO TABS: 300.0000 mg | ORAL_TABLET | Freq: Every day | ORAL | 3 refills | Status: DC

## 2016-09-21 ENCOUNTER — Telehealth: Payer: Self-pay | Admitting: Internal Medicine

## 2016-09-21 NOTE — Telephone Encounter (Signed)
SEE LAB RESULTS  

## 2016-09-21 NOTE — Telephone Encounter (Signed)
Patient returned Shannon's call. °

## 2016-09-27 ENCOUNTER — Other Ambulatory Visit: Payer: Self-pay

## 2016-09-27 MED ORDER — CANAGLIFLOZIN 300 MG PO TABS: 300.0000 mg | ORAL_TABLET | Freq: Every day | ORAL | 2 refills | Status: DC

## 2016-09-27 NOTE — Telephone Encounter (Signed)
Pt is having an issue with [facility]. He asked that we send Invok[facility] to [facility] for 30 days. I have sent it in. He will discuss the medication at his OV in 3 months and will decide if it needs to go to [facility] then.

## 2016-10-04 ENCOUNTER — Other Ambulatory Visit: Payer: Self-pay

## 2016-10-04 MED ORDER — HYDROCODONE-ACETAMINOPHEN 5-325 MG PO TABS: ORAL_TABLET | ORAL | 0 refills | Status: DC

## 2016-10-04 NOTE — Telephone Encounter (Signed)
Last filled 08-27-16 Last OV 09-15-16 Next OV 03-06-17  Forwarding to Dr Deborra Medina in Dr Alla German absence

## 2016-10-04 NOTE — Telephone Encounter (Signed)
Left a message on VM per DPR that rx is up front ready for pickup

## 2016-11-10 ENCOUNTER — Other Ambulatory Visit: Payer: Self-pay

## 2016-11-10 MED ORDER — HYDROCODONE-ACETAMINOPHEN 5-325 MG PO TABS: ORAL_TABLET | ORAL | 0 refills | Status: DC

## 2016-11-10 NOTE — Telephone Encounter (Signed)
Left message on vm that rx was ready for pickup 

## 2016-11-10 NOTE — Telephone Encounter (Signed)
Last filled 10-04-16 #60 Last OV 09-15-16 Next OV 03-06-17

## 2016-11-17 ENCOUNTER — Ambulatory Visit (INDEPENDENT_AMBULATORY_CARE_PROVIDER_SITE_OTHER): Payer: BLUE CROSS/BLUE SHIELD | Admitting: Internal Medicine

## 2016-11-17 ENCOUNTER — Encounter: Payer: Self-pay | Admitting: Internal Medicine

## 2016-11-17 VITALS — BP 130/70 | HR 66 | Ht 65.0 in | Wt 214.4 lb

## 2016-11-17 DIAGNOSIS — G4733 Obstructive sleep apnea (adult) (pediatric): Secondary | ICD-10-CM | POA: Diagnosis not present

## 2016-11-17 NOTE — Assessment & Plan Note (Signed)
He describes excellent compliance and good control with CPAP fixed pressure 10. We can leave pressure therefore now but we do need to get a download for documentation. He would like to have battery operated CPAP machine in case of power failure we discussed availability and options. We can hand write a prescription that he can choose to mail in if he wants to get this out of pocket.

## 2016-11-17 NOTE — Progress Notes (Signed)
Subjective:    Patient ID: [name] [name], male    DOB: Jul 25, 1951, 65 y.o.   MRN: [id number]  HPI  12/10/15The patient comes in today for follow-up of his obstructive sleep apnea. We had sent in order to his home care company to put his device on the automatic setting, but they have never done this. The patient is unhappy with their services, and wishes to be change to a new home care company. He wears his C Pap compliantly, but continues to feel the pressure is a little high. He is satisfied with his sleep and daytime alertness.  11/17/2016-65 year old male never smoker followed for OSA, complicated by DM 2, CAD, HBP NPSG 1998  AHI 58/ hr Former pt. of [facility]-Wears CPAP avg. 7 hrs. each night,has longer hose that disconnects during the night,pr. good,mask good. CPAP 10/ [facility]       No download is available Current machine about 66 years old. He definitely sleeps better with it. Says he can't sleep without it. After losing power recently, he asks about having a battery operated CPAP machine available. Doesn't use humidifier. He never took his machine to be changed to auto Pap after last visit here, and says he is very comfortable at a pressure of 10. No reported snoring through and little or no daytime sleepiness. No ENT surgery. Daytime breathing otherwise comfortable.  ROS-see HPI   Negative unless "+" Constitutional:    weight loss, night sweats, fevers, chills, fatigue, lassitude. HEENT:    headaches, difficulty swallowing, tooth/dental problems, sore throat,       sneezing, itching, ear ache, nasal congestion, post nasal drip, snoring CV:    chest pain, orthopnea, PND, swelling in lower extremities, anasarca,                                                   dizziness, palpitations Resp:   shortness of breath with exertion or at rest.                productive cough,   non-productive cough, coughing up of blood.              change in color of mucus.  wheezing.   Skin:    rash or  lesions. GI:  No-   heartburn, indigestion, abdominal pain, nausea, vomiting, diarrhea,                 change in bowel habits, loss of appetite GU: dysuria, change in color of urine, no urgency or frequency.   flank pain. MS:   joint pain, stiffness, decreased range of motion, back pain. Neuro-     nothing unusual Psych:  change in mood or affect.  depression or anxiety.   memory loss.    Objective:  OBJ- Physical Exam General- Alert, Oriented, Affect-appropriate, Distress- none acute, + Full beard, + overweight Skin- rash-none, lesions- none, excoriation- none Lymphadenopathy- none Head- atraumatic            Eyes- Gross vision intact, PERRLA, conjunctivae and secretions clear            Ears- Hearing, canals-normal            Nose- Clear, no-Septal dev, mucus, polyps, erosion, perforation             Throat- Mallampati II-III , mucosa clear , drainage- none,  tonsils- atrophic Neck- flexible , trachea midline, no stridor , thyroid nl, carotid no bruit Chest - symmetrical excursion , unlabored           Heart/CV- RRR , no murmur , no gallop  , no rub, nl s1 s2                           - JVD- none , edema- none, stasis changes- none, varices- none           Lung- clear to P&A, wheeze- none, cough- none , dullness-none, rub- none           Chest wall-  Abd-  Br/ Gen/ Rectal- Not done, not indicated Extrem- cyanosis- none, clubbing, none, atrophy- none, strength- nl Neuro- grossly intact to observation    Assessment & Plan:

## 2016-11-17 NOTE — Patient Instructions (Signed)
Order- DME Huey Romans  We can continue CPAP 10, mask of choice, humidifier, supplies, AirView     Dx OSA                          Please provide pressure compliance download   Print script for battery operated CPAP machine of choice-   10 cwp, supplies, mask of choice,      Dx OSA

## 2016-11-22 ENCOUNTER — Telehealth: Payer: Self-pay | Admitting: Internal Medicine

## 2016-11-22 NOTE — Telephone Encounter (Signed)
lmomtcb x1 

## 2016-11-24 NOTE — Telephone Encounter (Signed)
lmmtcb x 2

## 2016-11-25 NOTE — Telephone Encounter (Signed)
Spoke with patient, aware that we had placed an order for a CPAP download. Pt aware that if he is closer to our office than [facility] he can bring the card by here for the download. Pt states that if he does not get by [facility] soon then he will bring it by here the next time he is in [place].   Will send to [facility] and Dr Annamaria Boots as Juluis Rainier. Nothing further needed.

## 2016-12-02 ENCOUNTER — Encounter: Payer: Self-pay | Admitting: Internal Medicine

## 2016-12-15 ENCOUNTER — Other Ambulatory Visit: Payer: Self-pay | Admitting: Internal Medicine

## 2016-12-21 ENCOUNTER — Other Ambulatory Visit: Payer: Self-pay | Admitting: Internal Medicine

## 2016-12-24 NOTE — Telephone Encounter (Signed)
Last filled 11-12-16 #60 Last OV 09-15-16 Next OV 03-16-17

## 2016-12-24 NOTE — Telephone Encounter (Signed)
Spoke to pt's wife. RX up front ready for pickup

## 2017-01-22 ENCOUNTER — Other Ambulatory Visit: Payer: Self-pay | Admitting: Cardiovascular Disease

## 2017-02-01 ENCOUNTER — Other Ambulatory Visit: Payer: Self-pay | Admitting: Internal Medicine

## 2017-02-02 NOTE — Telephone Encounter (Signed)
Last f/u 03/2016 

## 2017-02-02 NOTE — Telephone Encounter (Signed)
Lm on pts vm and advised Rx is available for pickup from the front desk 

## 2017-02-15 DIAGNOSIS — M1711 Unilateral primary osteoarthritis, right knee: Secondary | ICD-10-CM | POA: Diagnosis not present

## 2017-02-15 DIAGNOSIS — M25562 Pain in left knee: Secondary | ICD-10-CM | POA: Diagnosis not present

## 2017-02-15 DIAGNOSIS — M25561 Pain in right knee: Secondary | ICD-10-CM | POA: Diagnosis not present

## 2017-02-15 DIAGNOSIS — M170 Bilateral primary osteoarthritis of knee: Secondary | ICD-10-CM | POA: Diagnosis not present

## 2017-02-15 DIAGNOSIS — R262 Difficulty in walking, not elsewhere classified: Secondary | ICD-10-CM | POA: Diagnosis not present

## 2017-02-22 DIAGNOSIS — M25561 Pain in right knee: Secondary | ICD-10-CM | POA: Diagnosis not present

## 2017-02-22 DIAGNOSIS — M1711 Unilateral primary osteoarthritis, right knee: Secondary | ICD-10-CM | POA: Diagnosis not present

## 2017-03-01 DIAGNOSIS — M25561 Pain in right knee: Secondary | ICD-10-CM | POA: Diagnosis not present

## 2017-03-01 DIAGNOSIS — M1711 Unilateral primary osteoarthritis, right knee: Secondary | ICD-10-CM | POA: Diagnosis not present

## 2017-03-08 DIAGNOSIS — M25561 Pain in right knee: Secondary | ICD-10-CM | POA: Diagnosis not present

## 2017-03-08 DIAGNOSIS — M1711 Unilateral primary osteoarthritis, right knee: Secondary | ICD-10-CM | POA: Diagnosis not present

## 2017-03-13 ENCOUNTER — Other Ambulatory Visit: Payer: Self-pay | Admitting: Internal Medicine

## 2017-03-14 NOTE — Telephone Encounter (Signed)
Left message on voicemail for patient to call back. Script placed up front for pickup.

## 2017-03-14 NOTE — Telephone Encounter (Signed)
Patient returned [name] [name]'s call.  I let patient know rx is ready for pick up.

## 2017-03-14 NOTE — Telephone Encounter (Signed)
Last filled 02-04-17 #60 Last OV 09-15-16 Next OV 03-16-17

## 2017-03-15 NOTE — Progress Notes (Signed)
Cardiology Office Note  Date:  03/17/2017   ID:  SIMRAN MANNIS, DOB [birth year], MRN [id number]  PCP:  Viviana Simpler, MD   Chief Complaint  Patient presents with  . other    30mo f/u. Pt states he is doing well. Reviewed meds with pt verbally.    HPI:  66 year-old gentleman with  obesity,  hyperlipidemia,  diabetes,  hypertension,  CAD, s/p CABG.  severe LAD disease, bypass surgery November 12 2011.  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 This seems to have improved. Continues to have  bilateral knee pain.  On prior office visits, reported that After a day in his boots, knees are very sore.    unable to tolerate metoprolol secondary to memory problems and mood disorder. Unable to tolerate carvedilol secondary to fatigue  Previous  Echocardiogram  for his shortness of breath showed normal function, no significant pulmonary hypertension. Normal right ventricular systolic pressure.  PMH:   has  a past medical history of Angina; Arthritis; BPH (benign prostatic hypertrophy); Carpal tunnel syndrome; Coronary artery disease (11/12/11); Diabetes mellitus; GERD (gastroesophageal reflux disease); colonic polyps; Hyperlipidemia; Hypertension; Kidney stones; OSA (obstructive sleep apnea); and Shortness of breath.  PSH:    Past Surgical History:  Procedure Laterality Date  . CORONARY ARTERY BYPASS GRAFT  11/12/2011   Procedure: CORONARY ARTERY BYPASS GRAFTING (CABG);  Surgeon: Grace Isaac, MD;  Location: [facility];  Service: Open Heart Surgery;  Laterality: N/A;  Coronary Artery Bypass Graft times three on pump utilizing left internal mammary artery and right saphenous vein harvested endoscopically   . HERNIA REPAIR    . KNEE SURGERY     left  . MENISCUS REPAIR  03/08   right knee    Current Outpatient Prescriptions  Medication Sig Dispense Refill  . amoxicillin (AMOXIL) 500 MG tablet Take 2 tablets (1,000 mg total) by mouth 2 (two) times daily. 40 tablet 0  . aspirin 81 MG tablet Take 2 tablets (162 mg total) by mouth daily.    . DUEXIS 800-26.6 MG TABS Take 1 tablet by mouth as needed.  1  . furosemide (LASIX) 40 MG tablet TAKE 1 TABLET TWICE A DAY AS NEEDED 180 tablet 0  . glucose blood (FREESTYLE LITE) test strip Use as instructed, patient tests once daily. Dx: 250.00 300 each 3  . HYDROcodone-acetaminophen (NORCO/VICODIN) 5-325 MG tablet TAKE ONE TABLET TWICE DAILY AS NEEDED FOR PAIN 60 tablet 0  . lisinopril (PRINIVIL,ZESTRIL) 10 MG tablet TAKE 1 TABLET DAILY 90 tablet 1  . metFORMIN (GLUCOPHAGE) 1000  MG tablet TAKE ONE-HALF TABLET BY MOUTH TWICE DAILY WITH MEALS 180 tablet 1  . pioglitazone (ACTOS) 30 MG tablet TAKE 1 TABLET DAILY 90 tablet 3  . potassium chloride SA (K-DUR,KLOR-CON) 20 MEQ tablet Take 20 mEq by mouth 2 (two) times daily as needed.    . triamcinolone cream (KENALOG) 0.1 % Apply 1 application topically 2 (two) times daily as needed. 30 g 0   No current  facility-administered medications for this visit.      Allergies:   Crestor [rosuvastatin]; Doxazosin mesylate; Doxycycline; Glimepiride; Glipizide; Losartan potassium-hctz; Metoprolol tartrate; Pravastatin; Rosiglitazone maleate; Simvastatin; Tetracyclines & related; and Carvedilol   Social History:  The patient  reports that he has never smoked. He has never used smokeless tobacco. He reports that he does not drink alcohol or use drugs.   Family History:   family history includes Coronary artery disease in his father; Prostate cancer in his father.    Review of Systems: Review of Systems  Constitutional: Negative.   Respiratory: Negative.   Cardiovascular: Negative.   Gastrointestinal: Negative.   Musculoskeletal: Positive for joint pain.       Right leg pain  Neurological: Negative.   Psychiatric/Behavioral: Negative.   All other systems reviewed and are negative.    PHYSICAL EXAM: VS:  BP (!) 150/82 (BP Location: Left Arm, Patient Position: Sitting, Cuff Size: Normal)   Pulse 69   Ht 5\' 5"  (1.651 m)   Wt 207 lb 8 oz (94.1 kg)   BMI 34.53 kg/m  , BMI Body mass index is 34.53 kg/m.  GEN: Well nourished, well developed, in no acute distress , obese HEENT: normal  Neck: no JVD, carotid bruits, or masses Cardiac: RRR; no murmurs, rubs, or gallops,no edema  Respiratory:  clear to auscultation bilaterally, normal work of breathing GI: soft, nontender, nondistended, + BS MS: no deformity or atrophy  Skin: warm and dry, no rash Neuro:  Strength and sensation are intact Psych: euthymic mood, full affect    Recent Labs: 03/16/2017: ALT 12; BUN 15; Creatinine, Ser 0.93; Hemoglobin 15.5; Platelets 254.0; Potassium 3.8; Sodium 137    Lipid Panel Lab Results  Component Value Date   CHOL 208 (H) 03/16/2017   HDL 45.90 03/16/2017   LDLCALC 137 (H) 03/16/2017   TRIG 125.0 03/16/2017      Wt Readings from Last 3 Encounters:  03/17/17 207 lb 8 oz (94.1 kg)  03/16/17 205  lb (93 kg)  11/17/16 214 lb 6.4 oz (97.3 kg)       ASSESSMENT AND PLAN:  Essential hypertension - Plan: EKG 12-Lead Blood pressure mildly elevated today even on recheck Blood pressure well controlled yesterday at primary care He attributes this to pneumonia shot left shoulder with pain Recommended he consider buying a blood pressure cuff and monitor numbers at home  Coronary artery disease involving native coronary artery of native heart without angina pectoris - Plan: EKG 12-Lead Currently with no symptoms of angina. No further workup at this time. Continue current medication regimen.  Pure hypercholesterolemia  unable to tolerate statins or even zetia  discussion concerning various new medication such as repatha and praluent He is not interested in pursuing these medications They would likely be relatively inexpensive with his [employer]  OSA (obstructive sleep apnea) Recommended weight loss  Uncontrolled type 2 diabetes mellitus with complication, without long-term current use of insulin (HCC) Hemoglobin A1c of 7.7 Recommended low bread, carbohydrates, weight loss  S/P CABG (coronary artery bypass graft) No angina,  no further testing at this time   Total encounter time more than 25 minutes  Greater than 50% was spent in counseling and coordination of care with the patient    Disposition:   F/U  6 months   Orders Placed This Encounter  Procedures  . EKG 12-Lead     Signed, Esmond Plants, M.D., Ph.D. 03/17/2017  [place], [place]  \

## 2017-03-16 ENCOUNTER — Ambulatory Visit (INDEPENDENT_AMBULATORY_CARE_PROVIDER_SITE_OTHER): Payer: Medicare Other | Admitting: Internal Medicine

## 2017-03-16 ENCOUNTER — Encounter: Payer: Self-pay | Admitting: Internal Medicine

## 2017-03-16 VITALS — BP 114/70 | HR 71 | Temp 98.0°F | Ht 64.75 in | Wt 205.0 lb

## 2017-03-16 DIAGNOSIS — M1711 Unilateral primary osteoarthritis, right knee: Secondary | ICD-10-CM | POA: Diagnosis not present

## 2017-03-16 DIAGNOSIS — E118 Type 2 diabetes mellitus with unspecified complications: Secondary | ICD-10-CM

## 2017-03-16 DIAGNOSIS — E782 Mixed hyperlipidemia: Secondary | ICD-10-CM | POA: Diagnosis not present

## 2017-03-16 DIAGNOSIS — I25118 Atherosclerotic heart disease of native coronary artery with other forms of angina pectoris: Secondary | ICD-10-CM | POA: Diagnosis not present

## 2017-03-16 DIAGNOSIS — Z7189 Other specified counseling: Secondary | ICD-10-CM | POA: Insufficient documentation

## 2017-03-16 DIAGNOSIS — M170 Bilateral primary osteoarthritis of knee: Secondary | ICD-10-CM

## 2017-03-16 DIAGNOSIS — Z1211 Encounter for screening for malignant neoplasm of colon: Secondary | ICD-10-CM

## 2017-03-16 DIAGNOSIS — Z23 Encounter for immunization: Secondary | ICD-10-CM | POA: Diagnosis not present

## 2017-03-16 DIAGNOSIS — IMO0002 Reserved for concepts with insufficient information to code with codable children: Secondary | ICD-10-CM

## 2017-03-16 DIAGNOSIS — Z0000 Encounter for general adult medical examination without abnormal findings: Secondary | ICD-10-CM

## 2017-03-16 DIAGNOSIS — G8929 Other chronic pain: Secondary | ICD-10-CM | POA: Diagnosis not present

## 2017-03-16 DIAGNOSIS — E1165 Type 2 diabetes mellitus with hyperglycemia: Secondary | ICD-10-CM

## 2017-03-16 DIAGNOSIS — M25561 Pain in right knee: Secondary | ICD-10-CM | POA: Diagnosis not present

## 2017-03-16 LAB — COMPREHENSIVE METABOLIC PANEL
ALT: 12 U/L (ref 0–53)
AST: 13 U/L (ref 0–37)
Albumin: 4.2 g/dL (ref 3.5–5.2)
Alkaline Phosphatase: 82 U/L (ref 39–117)
BILIRUBIN TOTAL: 0.6 mg/dL (ref 0.2–1.2)
BUN: 15 mg/dL (ref 6–23)
CALCIUM: 9.4 mg/dL (ref 8.4–10.5)
CO2: 31 meq/L (ref 19–32)
CREATININE: 0.93 mg/dL (ref 0.40–1.50)
Chloride: 98 mEq/L (ref 96–112)
GFR: 86.29 mL/min (ref >60.00)
Glucose, Bld: 158 mg/dL — ABNORMAL HIGH (ref 70–99)
Potassium: 3.8 mEq/L (ref 3.5–5.1)
SODIUM: 137 meq/L (ref 135–145)
Total Protein: 6.9 g/dL (ref 6.0–8.3)

## 2017-03-16 LAB — CBC WITH DIFFERENTIAL/PLATELET
BASOS ABS: 0.1 10*3/uL (ref 0.0–0.1)
BASOS PCT: 1.6 % (ref 0.0–3.0)
EOS ABS: 0.2 10*3/uL (ref 0.0–0.7)
Eosinophils Relative: 2.8 % (ref 0.0–5.0)
HEMATOCRIT: 45.4 % (ref 39.0–52.0)
Hemoglobin: 15.5 g/dL (ref 13.0–17.0)
LYMPHS PCT: 35.6 % (ref 12.0–46.0)
Lymphs Abs: 2 10*3/uL (ref 0.7–4.0)
MCHC: 34.2 g/dL (ref 30.0–36.0)
MCV: 87.5 fl (ref 78.0–100.0)
MONO ABS: 0.7 10*3/uL (ref 0.1–1.0)
Monocytes Relative: 12.4 % — ABNORMAL HIGH (ref 3.0–12.0)
NEUTROS PCT: 47.6 % (ref 43.0–77.0)
Neutro Abs: 2.7 10*3/uL (ref 1.4–7.7)
PLATELETS: 254 10*3/uL (ref 150.0–400.0)
RBC: 5.19 Mil/uL (ref 4.22–5.81)
RDW: 13.7 % (ref 11.5–15.5)
WBC: 5.7 10*3/uL (ref 4.0–10.5)

## 2017-03-16 LAB — LIPID PANEL
CHOL/HDL RATIO: 5
CHOLESTEROL: 208 mg/dL — AB (ref 0–200)
HDL: 45.9 mg/dL (ref >39.00)
LDL Cholesterol: 137 mg/dL — ABNORMAL HIGH (ref 0–99)
NonHDL: 162.4
TRIGLYCERIDES: 125 mg/dL (ref 0.0–149.0)
VLDL: 25 mg/dL (ref 0.0–40.0)

## 2017-03-16 LAB — HM DIABETES FOOT EXAM

## 2017-03-16 LAB — HEMOGLOBIN A1C: HEMOGLOBIN A1C: 7.7 % — AB (ref 4.6–6.5)

## 2017-03-16 MED ORDER — AMOXICILLIN 500 MG PO TABS: 1000.0000 mg | ORAL_TABLET | Freq: Two times a day (BID) | ORAL | 0 refills | Status: AC

## 2017-03-16 NOTE — Patient Instructions (Signed)
Please get a copy of your eye exam for my records

## 2017-03-16 NOTE — Assessment & Plan Note (Signed)
Gets SOB but doesn't seem anginal Keeps up with cardiologist

## 2017-03-16 NOTE — Progress Notes (Signed)
Pre visit review using our clinic review tool, if applicable. No additional management support is needed unless otherwise documented below in the visit note. 

## 2017-03-16 NOTE — Addendum Note (Signed)
Addended by: Pilar Grammes on: 03/16/2017 11:36 AM   Modules accepted: Orders

## 2017-03-16 NOTE — Assessment & Plan Note (Addendum)
Still very limiting Injections haven't really helped On chronic hydrocodone Checked CSRS---nothing except from this office

## 2017-03-16 NOTE — Progress Notes (Signed)
   Subjective:    Patient ID: [name] [name], male    DOB: 1951/03/09, 66 y.o.   MRN: [id number]  HPI    Review of Systems     Objective:   Physical Exam  HENT:  Inflamed left lower incisor--redness and tenderness.  Amoxicillin Rx given          Assessment & Plan:

## 2017-03-16 NOTE — Assessment & Plan Note (Signed)
Worried about possible side effects ---so stopped invokana. Also too expensive Will try glipizide again if well over 8% (vague problem)

## 2017-03-16 NOTE — Assessment & Plan Note (Signed)
I have personally reviewed the Medicare Annual Wellness questionnaire and have noted 1. The patient's medical and social history 2. Their use of alcohol, tobacco or illicit drugs 3. Their current medications and supplements 4. The patient's functional ability including ADL's, fall risks, home safety risks and hearing or visual             impairment. 5. Diet and physical activities 6. Evidence for depression or mood disorders  The patients weight, height, BMI and visual acuity have been recorded in the chart I have made referrals, counseling and provided education to the patient based review of the above and I have provided the pt with a written personalized care plan for preventive services.  I have provided you with a copy of your personalized plan for preventive services. Please take the time to review along with your updated medication list.  Will check FIT Prefers no PSA after discussion Gets EKGs with cardiologist Never a smoker Discussed fitness

## 2017-03-16 NOTE — Assessment & Plan Note (Signed)
See social history Blank forms given 

## 2017-03-16 NOTE — Progress Notes (Signed)
Subjective:    Patient ID: [name] [name], male    DOB: [birth year], 66 y.o.   MRN: [id number]  HPI Here for initial Medicare Preventative exam and follow up of chronic health conditions Also has the normal insurance through work (for wife's coverage) Reviewed form and advanced directives Reviewed other doctors--Gollan--cardio, Young--sleep, [facility] (for ortho) No tobacco or alcohol No set exercise but active daily Falls on a regular basis--like working on fences in pastures, etc. No injuries No depression or anhedonia Vision and hearing are okay Independent with instrumental ADLs Memory issues---forgets where he put stuff, etc  Checks sugars twice a week or so--- last was 110 Took the invokana for some months--then bad knee and muscle pain so he stopped both about 6 weeks ago. Pain went away No numbness in legs--but chronic right leg pain (relates to knee) Had eye exam--will get me the records  No chest pain Gets SOB at times--like last week. Relates to allergies Dizziness 1 day--no fainting Mild chronic pedal edema--no change  Voids okay Stream seems okay Nocturia stable at 1 per night  Has stiffness in right knee--affects his walking Getting injections at ortho--not really helping Considering TKR Took ibuprofen--but not better than hydrocodone and concerned about side effects Uses the hydrocodone 1-2 daily  Current Outpatient Prescriptions on File Prior to Visit  Medication Sig Dispense Refill  . aspirin 81 MG tablet Take 2 tablets (162 mg total) by mouth daily.    . furosemide (LASIX) 40 MG tablet TAKE 1 TABLET TWICE A DAY AS NEEDED 180 tablet 0  . glucose blood (FREESTYLE LITE) test strip Use as instructed, patient tests once daily. Dx: 250.00 300 each 3  . HYDROcodone-acetaminophen (NORCO/VICODIN) 5-325 MG tablet TAKE ONE TABLET TWICE DAILY AS NEEDED FOR PAIN 60 tablet 0  . lisinopril (PRINIVIL,ZESTRIL) 10 MG tablet TAKE 1 TABLET DAILY 90 tablet 1  .  metFORMIN (GLUCOPHAGE) 1000 MG tablet TAKE ONE-HALF TABLET BY MOUTH TWICE DAILY WITH MEALS 180 tablet 1  . pioglitazone (ACTOS) 30 MG tablet TAKE 1 TABLET DAILY 90 tablet 3  . potassium chloride SA (K-DUR,KLOR-CON) 20 MEQ tablet Take 20 mEq by mouth 2 (two) times daily as needed.    . triamcinolone cream (KENALOG) 0.1 % Apply 1 application topically 2 (two) times daily as needed. 30 g 0   No current facility-administered medications on file prior to visit.     Allergies  Allergen Reactions  . Crestor [Rosuvastatin]     Muscle aches.  . Doxazosin Mesylate     REACTION: didn't work and may have caused SOB  . Doxycycline     REACTION: unspecified  . Glimepiride     REACTION: red eyes  . Glipizide     REACTION: myalgia??  . Losartan Potassium-Hctz     REACTION: chest \\T \ leg pain  . Metoprolol Tartrate     Mood swings, memory changes  . Pravastatin     Muscle aches   . Rosiglitazone Maleate     REACTION: myalgia  . Simvastatin     REACTION: myalgias  . Carvedilol     Felt bad    Past Medical History:  Diagnosis Date  . Angina   . Arthritis   . BPH (benign prostatic hypertrophy)   . Carpal tunnel syndrome   . Cerebral aneurysm   . Coronary artery disease 11/12/11   s/p CABG x 3  . Diabetes mellitus    type 2  . GERD (gastroesophageal reflux disease)   . Hx of  colonic polyps   . Hyperlipidemia   . Hypertension   . Kidney stones   . OSA (obstructive sleep apnea)    uses cpap  . Shortness of breath     Past Surgical History:  Procedure Laterality Date  . CORONARY ARTERY BYPASS GRAFT  11/12/2011   Procedure: CORONARY ARTERY BYPASS GRAFTING (CABG);  Surgeon: Grace Isaac, MD;  Location: [place];  Service: Open Heart Surgery;  Laterality: N/A;  Coronary Artery Bypass Graft times three on pump utilizing left internal mammary artery and right saphenous vein harvested endoscopically   . HERNIA REPAIR    . KNEE SURGERY     left  . MENISCUS REPAIR  03/08   right knee      Family History  Problem Relation Age of Onset  . Coronary artery disease Father   . Prostate cancer Father   . Diabetes Neg Hx   . Hypertension Neg Hx     Social History   Social History  . Marital status: Married    Spouse name: N/A  . Number of children: 3  . Years of education: N/A   Occupational History  . tool maker    Social History Main Topics  . Smoking status: Never Smoker  . Smokeless tobacco: Never Used  . Alcohol use No  . Drug use: No  . Sexual activity: Yes   Other Topics Concern  . Not on file   Social History Narrative   No living will   Requests wife as health care POA   Would accept resuscitation   Would accept tube feedings   Review of Systems Weight down 10#---he didn't realize it Sleeps okay--uses CPAP every night with success Wears seat belt inconsistently--discussed Abscessed tooth on bottom--needs some antibiotics. (only 4 left on bottom) Bowels okay-- no blood Some right hip pain as well Easy bruising with his work--no ulcers No heartburn or dysphagia    Objective:   Physical Exam  Constitutional: He is oriented to person, place, and time.  Neurological: He is alert and oriented to person, place, and time.  President--- "[name] [name], [name] [name]" [phone] D-l-[id number] Recall 3/3  Normal sensation in feet  Skin: No rash noted. No erythema.  No foot lesions  Psychiatric: He has a normal mood and affect. His behavior is normal.          Assessment & Plan:

## 2017-03-16 NOTE — Assessment & Plan Note (Signed)
Intolerant of statins and zetia

## 2017-03-17 ENCOUNTER — Ambulatory Visit (INDEPENDENT_AMBULATORY_CARE_PROVIDER_SITE_OTHER): Payer: BLUE CROSS/BLUE SHIELD | Admitting: Cardiovascular Disease

## 2017-03-17 ENCOUNTER — Encounter: Payer: Self-pay | Admitting: Cardiovascular Disease

## 2017-03-17 ENCOUNTER — Encounter: Payer: Self-pay | Admitting: *Deleted

## 2017-03-17 VITALS — BP 150/82 | HR 69 | Ht 65.0 in | Wt 207.5 lb

## 2017-03-17 DIAGNOSIS — I10 Essential (primary) hypertension: Secondary | ICD-10-CM | POA: Diagnosis not present

## 2017-03-17 DIAGNOSIS — R0602 Shortness of breath: Secondary | ICD-10-CM

## 2017-03-17 DIAGNOSIS — Z951 Presence of aortocoronary bypass graft: Secondary | ICD-10-CM | POA: Diagnosis not present

## 2017-03-17 DIAGNOSIS — E782 Mixed hyperlipidemia: Secondary | ICD-10-CM | POA: Diagnosis not present

## 2017-03-17 DIAGNOSIS — IMO0002 Reserved for concepts with insufficient information to code with codable children: Secondary | ICD-10-CM

## 2017-03-17 DIAGNOSIS — E118 Type 2 diabetes mellitus with unspecified complications: Secondary | ICD-10-CM

## 2017-03-17 DIAGNOSIS — E1165 Type 2 diabetes mellitus with hyperglycemia: Secondary | ICD-10-CM

## 2017-03-17 DIAGNOSIS — R600 Localized edema: Secondary | ICD-10-CM | POA: Diagnosis not present

## 2017-03-17 DIAGNOSIS — G4733 Obstructive sleep apnea (adult) (pediatric): Secondary | ICD-10-CM

## 2017-03-17 DIAGNOSIS — I25118 Atherosclerotic heart disease of native coronary artery with other forms of angina pectoris: Secondary | ICD-10-CM

## 2017-03-17 DIAGNOSIS — I209 Angina pectoris, unspecified: Secondary | ICD-10-CM

## 2017-03-17 NOTE — Patient Instructions (Addendum)

## 2017-03-22 ENCOUNTER — Encounter: Payer: Self-pay | Admitting: *Deleted

## 2017-03-22 LAB — TOXASSURE SELECT 13 (MW), URINE

## 2017-04-13 DIAGNOSIS — H5211 Myopia, right eye: Secondary | ICD-10-CM | POA: Diagnosis not present

## 2017-04-13 DIAGNOSIS — H52223 Regular astigmatism, bilateral: Secondary | ICD-10-CM | POA: Diagnosis not present

## 2017-04-13 DIAGNOSIS — H5203 Hypermetropia, bilateral: Secondary | ICD-10-CM | POA: Diagnosis not present

## 2017-04-13 DIAGNOSIS — H2513 Age-related nuclear cataract, bilateral: Secondary | ICD-10-CM | POA: Diagnosis not present

## 2017-04-13 DIAGNOSIS — H524 Presbyopia: Secondary | ICD-10-CM | POA: Diagnosis not present

## 2017-04-13 DIAGNOSIS — H52221 Regular astigmatism, right eye: Secondary | ICD-10-CM | POA: Diagnosis not present

## 2017-04-13 DIAGNOSIS — E113293 Type 2 diabetes mellitus with mild nonproliferative diabetic retinopathy without macular edema, bilateral: Secondary | ICD-10-CM | POA: Diagnosis not present

## 2017-04-13 DIAGNOSIS — H40053 Ocular hypertension, bilateral: Secondary | ICD-10-CM | POA: Diagnosis not present

## 2017-04-13 LAB — HM DIABETES EYE EXAM

## 2017-04-20 ENCOUNTER — Other Ambulatory Visit (INDEPENDENT_AMBULATORY_CARE_PROVIDER_SITE_OTHER): Payer: BLUE CROSS/BLUE SHIELD

## 2017-04-20 ENCOUNTER — Encounter: Payer: Self-pay | Admitting: *Deleted

## 2017-04-20 DIAGNOSIS — Z1211 Encounter for screening for malignant neoplasm of colon: Secondary | ICD-10-CM | POA: Diagnosis not present

## 2017-04-20 LAB — FECAL OCCULT BLOOD, IMMUNOCHEMICAL: Fecal Occult Bld: NEGATIVE

## 2017-04-25 ENCOUNTER — Other Ambulatory Visit: Payer: Self-pay | Admitting: Internal Medicine

## 2017-04-25 DIAGNOSIS — G8929 Other chronic pain: Secondary | ICD-10-CM

## 2017-04-26 DIAGNOSIS — G8929 Other chronic pain: Secondary | ICD-10-CM | POA: Insufficient documentation

## 2017-04-26 MED ORDER — HYDROCODONE-ACETAMINOPHEN 5-325 MG PO TABS: 1.0000 | ORAL_TABLET | Freq: Two times a day (BID) | ORAL | 0 refills | Status: DC | PRN

## 2017-04-26 MED ORDER — HYDROCODONE-ACETAMINOPHEN 5-325 MG PO TABS
1.0000 | ORAL_TABLET | Freq: Two times a day (BID) | ORAL | 0 refills | Status: DC | PRN
Start: 2017-04-26 — End: 2017-06-09

## 2017-04-26 NOTE — Telephone Encounter (Signed)
Chart reviewed. Rx printed and in [place] box.

## 2017-04-26 NOTE — Telephone Encounter (Signed)
Spoke to pt. Rx up front ready for pickup 

## 2017-04-26 NOTE — Telephone Encounter (Signed)
We realized the rx was set as phone in. I set it up as print and it went to a printer without rx paper. I will have someone print it for me.

## 2017-04-26 NOTE — Telephone Encounter (Signed)
Last filled 03-16-17 #60 Last OV 03-16-17 Next OV 09-14-17  Forwarding to Dr Darnell Level in Dr Alla German absence. Please give back to me when approved/denied. Thanks

## 2017-04-29 ENCOUNTER — Other Ambulatory Visit: Payer: Self-pay | Admitting: Internal Medicine

## 2017-04-29 ENCOUNTER — Telehealth: Payer: Self-pay

## 2017-04-29 NOTE — Telephone Encounter (Signed)
Pt request letter excusing him from jury duty due to taking fluid pills and has bad knees and when sits for long period of time can hardly walk when stands. Jury duty is July 05, 2017. Pt request cb next week about jury excuse.

## 2017-05-04 ENCOUNTER — Encounter: Payer: Self-pay | Admitting: Internal Medicine

## 2017-05-04 DIAGNOSIS — Z7689 Persons encountering health services in other specified circumstances: Secondary | ICD-10-CM

## 2017-05-04 NOTE — Telephone Encounter (Signed)
Letter done $20 charge 

## 2017-05-04 NOTE — Telephone Encounter (Signed)
I left a message on patient's home answering machine letter is ready and of $20 charge.

## 2017-06-09 ENCOUNTER — Other Ambulatory Visit: Payer: Self-pay | Admitting: Family Medicine

## 2017-06-09 NOTE — Telephone Encounter (Signed)
Last filled 04-26-17 #60 Last OV 03-16-17 Next OV 09-14-17   Last UDS 03-16-17

## 2017-06-10 NOTE — Telephone Encounter (Signed)
Left message that rx was up front. No name was given of the rx.

## 2017-07-20 ENCOUNTER — Other Ambulatory Visit: Payer: Self-pay | Admitting: Internal Medicine

## 2017-07-21 NOTE — Telephone Encounter (Signed)
Spoke to pt's wife per DPR. rx up front

## 2017-07-21 NOTE — Telephone Encounter (Signed)
Last filled 06-13-17 #60  Last OV 03-16-17 Next OV 09-14-17 Last UDS/CSA/CSRS 03-16-17

## 2017-07-21 NOTE — Telephone Encounter (Signed)
Needs appt

## 2017-09-07 ENCOUNTER — Other Ambulatory Visit: Payer: Self-pay | Admitting: Internal Medicine

## 2017-09-08 NOTE — Telephone Encounter (Signed)
Spoken to patient's wife, Notified that Rx is ready for pick up. Left in the front office. It looks like patient already schedule a follow up on 09/14/2017. Reminder patient's wife of appointment

## 2017-09-08 NOTE — Telephone Encounter (Signed)
Please set up appt in the next few weeks for chronic narcotic management

## 2017-09-08 NOTE — Telephone Encounter (Signed)
Last filled #60 on 07/21/17 last office visit 03/16/17 no upcoming appointments scheduled.

## 2017-09-12 ENCOUNTER — Other Ambulatory Visit: Payer: Self-pay | Admitting: Internal Medicine

## 2017-09-14 ENCOUNTER — Ambulatory Visit (INDEPENDENT_AMBULATORY_CARE_PROVIDER_SITE_OTHER): Payer: BLUE CROSS/BLUE SHIELD | Admitting: Internal Medicine

## 2017-09-14 ENCOUNTER — Encounter: Payer: Self-pay | Admitting: Internal Medicine

## 2017-09-14 VITALS — BP 122/70 | HR 75 | Temp 98.1°F | Wt 210.0 lb

## 2017-09-14 DIAGNOSIS — K529 Noninfective gastroenteritis and colitis, unspecified: Secondary | ICD-10-CM | POA: Insufficient documentation

## 2017-09-14 DIAGNOSIS — F1120 Opioid dependence, uncomplicated: Secondary | ICD-10-CM | POA: Diagnosis not present

## 2017-09-14 DIAGNOSIS — E118 Type 2 diabetes mellitus with unspecified complications: Secondary | ICD-10-CM | POA: Diagnosis not present

## 2017-09-14 DIAGNOSIS — I25119 Atherosclerotic heart disease of native coronary artery with unspecified angina pectoris: Secondary | ICD-10-CM | POA: Diagnosis not present

## 2017-09-14 LAB — HEMOGLOBIN A1C: HEMOGLOBIN A1C: 8.2 % — AB (ref 4.6–6.5)

## 2017-09-14 MED ORDER — NITROGLYCERIN 0.4 MG SL SUBL: 0.4000 mg | SUBLINGUAL_TABLET | SUBLINGUAL | 3 refills | Status: DC | PRN

## 2017-09-14 NOTE — Assessment & Plan Note (Signed)
Chronic back pain Has cut down to once a day on the hydrocodone for the most part CSRS checked--no problems Didn't get help with the ibuprofen

## 2017-09-14 NOTE — Assessment & Plan Note (Signed)
Does have DOE which is likely anginal equivalent Will refill the nitro

## 2017-09-14 NOTE — Assessment & Plan Note (Signed)
Seems to have acceptable control Complicated by CAD Will check A1c

## 2017-09-14 NOTE — Assessment & Plan Note (Signed)
Seems to be mostly better

## 2017-09-14 NOTE — Progress Notes (Signed)
Subjective:    Patient ID: [name] [name], male    DOB: Mar 27, 1951, 66 y.o.   MRN: [id number]  HPI Here for follow up of diabetes and other chronic medical conditions  Doing "fair" Got sick 5 days ago Nausea, loss of appetite, weak------ slowly improving but still weak Wife now has this Eating but not normal  Sugars are okay Checks rarely--usually around 150 No hypoglycemic reactions  No heart symptoms unless he takes allergy pills (would give him twinge) Better without that Some SOB with walking--- improves with rest No recent nitro--will refill  Back has been worse lately Will lock up on him Some left side pain could be related to past lithotripsy---pulsating type pain Did improve some--does back stretching. Inversion table didn't help this time Takes the hydrocodone once a day usually Dr Alvan Dame gave him duexis (famotidine/ibuprofen)---but not really effective  Current Outpatient Prescriptions on File Prior to Visit  Medication Sig Dispense Refill  . aspirin 81 MG tablet Take 2 tablets (162 mg total) by mouth daily.    . DUEXIS 800-26.6 MG TABS Take 1 tablet by mouth as needed.  1  . furosemide (LASIX) 40 MG tablet TAKE 1 TABLET TWICE A DAY AS NEEDED 180 tablet 0  . glucose blood (FREESTYLE LITE) test strip Use as instructed, patient tests once daily. Dx: 250.00 300 each 3  . HYDROcodone-acetaminophen (NORCO/VICODIN) 5-325 MG tablet TAKE ONE TABLET BY MOUTH TWICE DAILY AS NEEDED FOR PAIN 60 tablet 0  . lisinopril (PRINIVIL,ZESTRIL) 10 MG tablet TAKE 1 TABLET DAILY 90 tablet 1  . metFORMIN (GLUCOPHAGE) 1000 MG tablet TAKE ONE-HALF TABLET BY MOUTH TWICE DAILY WITH MEALS 180 tablet 3  . pioglitazone (ACTOS) 30 MG tablet TAKE 1 TABLET DAILY 90 tablet 3  . potassium chloride SA (K-DUR,KLOR-CON) 20 MEQ tablet Take 20 mEq by mouth 2 (two) times daily as needed.    . triamcinolone cream (KENALOG) 0.1 % Apply 1 application topically 2 (two) times daily as needed. 30 g 0   No  current facility-administered medications on file prior to visit.     Allergies  Allergen Reactions  . Crestor [Rosuvastatin]     Muscle aches.  . Doxazosin Mesylate     REACTION: didn't work and may have caused SOB  . Doxycycline     REACTION: unspecified  . Glimepiride     REACTION: red eyes  . Glipizide     REACTION: myalgia??  . Losartan Potassium-Hctz     REACTION: chest \\T \ leg pain  . Metoprolol Tartrate     Mood swings, memory changes  . Pravastatin     Muscle aches   . Rosiglitazone Maleate     REACTION: myalgia  . Simvastatin     REACTION: myalgias  . Tetracyclines & Related   . Carvedilol     Felt bad    Past Medical History:  Diagnosis Date  . Angina   . Arthritis   . BPH (benign prostatic hypertrophy)   . Carpal tunnel syndrome   . Coronary artery disease 11/12/11   s/p CABG x 3  . Diabetes mellitus    type 2  . GERD (gastroesophageal reflux disease)   . Hx of colonic polyps   . Hyperlipidemia   . Hypertension   . Kidney stones   . OSA (obstructive sleep apnea)    uses cpap  . Shortness of breath     Past Surgical History:  Procedure Laterality Date  . CORONARY ARTERY BYPASS GRAFT  11/12/2011  Procedure: CORONARY ARTERY BYPASS GRAFTING (CABG);  Surgeon: Grace Isaac, MD;  Location: [facility];  Service: Open Heart Surgery;  Laterality: N/A;  Coronary Artery Bypass Graft times three on pump utilizing left internal mammary artery and right saphenous vein harvested endoscopically   . HERNIA REPAIR    . KNEE SURGERY     left  . MENISCUS REPAIR  03/08   right knee    Family History  Problem Relation Age of Onset  . Coronary artery disease Father   . Prostate cancer Father   . Diabetes Neg Hx   . Hypertension Neg Hx     Social History   Social History  . Marital status: Married    Spouse name: N/A  . Number of children: 3  . Years of education: N/A   Occupational History  . tool maker    Social History Main Topics  . Smoking  status: Never Smoker  . Smokeless tobacco: Never Used  . Alcohol use No  . Drug use: No  . Sexual activity: Yes   Other Topics Concern  . Not on file   Social History Narrative   No living will   Requests wife as health care POA   Would accept resuscitation   Would accept tube feedings   Review of Systems Some cramps in legs recently---- just since illness Appetite was fine till this illness    Objective:   Physical Exam  Constitutional: No distress.  Neck: No thyromegaly present.  Cardiovascular: Normal rate, regular rhythm, normal heart sounds and intact distal pulses.  Exam reveals no gallop.   No murmur heard. Pulmonary/Chest: Effort normal and breath sounds normal. No respiratory distress. He has no wheezes. He has no rales.  Abdominal: Soft. He exhibits no distension. There is no tenderness. There is no rebound and no guarding.  Musculoskeletal: He exhibits no edema or tenderness.  Lymphadenopathy:    He has no cervical adenopathy.  Skin: No rash noted. No erythema.  No foot lesions  Psychiatric: He has a normal mood and affect. His behavior is normal.          Assessment & Plan:

## 2017-09-19 ENCOUNTER — Other Ambulatory Visit: Payer: Self-pay | Admitting: Internal Medicine

## 2017-10-04 DIAGNOSIS — M5136 Other intervertebral disc degeneration, lumbar region: Secondary | ICD-10-CM | POA: Diagnosis not present

## 2017-10-04 DIAGNOSIS — M9905 Segmental and somatic dysfunction of pelvic region: Secondary | ICD-10-CM | POA: Diagnosis not present

## 2017-10-04 DIAGNOSIS — M9903 Segmental and somatic dysfunction of lumbar region: Secondary | ICD-10-CM | POA: Diagnosis not present

## 2017-10-04 DIAGNOSIS — M5416 Radiculopathy, lumbar region: Secondary | ICD-10-CM | POA: Diagnosis not present

## 2017-10-05 DIAGNOSIS — M5136 Other intervertebral disc degeneration, lumbar region: Secondary | ICD-10-CM | POA: Diagnosis not present

## 2017-10-05 DIAGNOSIS — M9905 Segmental and somatic dysfunction of pelvic region: Secondary | ICD-10-CM | POA: Diagnosis not present

## 2017-10-05 DIAGNOSIS — M5416 Radiculopathy, lumbar region: Secondary | ICD-10-CM | POA: Diagnosis not present

## 2017-10-05 DIAGNOSIS — M9903 Segmental and somatic dysfunction of lumbar region: Secondary | ICD-10-CM | POA: Diagnosis not present

## 2017-10-07 DIAGNOSIS — M9905 Segmental and somatic dysfunction of pelvic region: Secondary | ICD-10-CM | POA: Diagnosis not present

## 2017-10-07 DIAGNOSIS — M5136 Other intervertebral disc degeneration, lumbar region: Secondary | ICD-10-CM | POA: Diagnosis not present

## 2017-10-07 DIAGNOSIS — M5416 Radiculopathy, lumbar region: Secondary | ICD-10-CM | POA: Diagnosis not present

## 2017-10-07 DIAGNOSIS — M9903 Segmental and somatic dysfunction of lumbar region: Secondary | ICD-10-CM | POA: Diagnosis not present

## 2017-10-10 DIAGNOSIS — M9903 Segmental and somatic dysfunction of lumbar region: Secondary | ICD-10-CM | POA: Diagnosis not present

## 2017-10-10 DIAGNOSIS — M9905 Segmental and somatic dysfunction of pelvic region: Secondary | ICD-10-CM | POA: Diagnosis not present

## 2017-10-10 DIAGNOSIS — M5416 Radiculopathy, lumbar region: Secondary | ICD-10-CM | POA: Diagnosis not present

## 2017-10-10 DIAGNOSIS — M5136 Other intervertebral disc degeneration, lumbar region: Secondary | ICD-10-CM | POA: Diagnosis not present

## 2017-10-13 DIAGNOSIS — M25561 Pain in right knee: Secondary | ICD-10-CM | POA: Diagnosis not present

## 2017-10-13 DIAGNOSIS — M1711 Unilateral primary osteoarthritis, right knee: Secondary | ICD-10-CM | POA: Diagnosis not present

## 2017-10-14 DIAGNOSIS — M5136 Other intervertebral disc degeneration, lumbar region: Secondary | ICD-10-CM | POA: Diagnosis not present

## 2017-10-14 DIAGNOSIS — M9903 Segmental and somatic dysfunction of lumbar region: Secondary | ICD-10-CM | POA: Diagnosis not present

## 2017-10-14 DIAGNOSIS — M9905 Segmental and somatic dysfunction of pelvic region: Secondary | ICD-10-CM | POA: Diagnosis not present

## 2017-10-14 DIAGNOSIS — M5416 Radiculopathy, lumbar region: Secondary | ICD-10-CM | POA: Diagnosis not present

## 2017-10-17 ENCOUNTER — Other Ambulatory Visit: Payer: Self-pay | Admitting: Internal Medicine

## 2017-10-18 DIAGNOSIS — M9905 Segmental and somatic dysfunction of pelvic region: Secondary | ICD-10-CM | POA: Diagnosis not present

## 2017-10-18 DIAGNOSIS — M5416 Radiculopathy, lumbar region: Secondary | ICD-10-CM | POA: Diagnosis not present

## 2017-10-18 DIAGNOSIS — M5136 Other intervertebral disc degeneration, lumbar region: Secondary | ICD-10-CM | POA: Diagnosis not present

## 2017-10-18 DIAGNOSIS — M9903 Segmental and somatic dysfunction of lumbar region: Secondary | ICD-10-CM | POA: Diagnosis not present

## 2017-10-18 NOTE — Telephone Encounter (Signed)
Last filled 09-09-17 #60 Last OV 09-14-17 Next OV 12-16-17 Last CSA/UDS 03-16-17

## 2017-10-19 ENCOUNTER — Other Ambulatory Visit: Payer: Self-pay | Admitting: Cardiovascular Disease

## 2017-10-19 NOTE — Telephone Encounter (Signed)
Left message for pt that rx is up front ready for pickup

## 2017-10-20 DIAGNOSIS — M9905 Segmental and somatic dysfunction of pelvic region: Secondary | ICD-10-CM | POA: Diagnosis not present

## 2017-10-20 DIAGNOSIS — M9903 Segmental and somatic dysfunction of lumbar region: Secondary | ICD-10-CM | POA: Diagnosis not present

## 2017-10-20 DIAGNOSIS — M5136 Other intervertebral disc degeneration, lumbar region: Secondary | ICD-10-CM | POA: Diagnosis not present

## 2017-10-20 DIAGNOSIS — M5416 Radiculopathy, lumbar region: Secondary | ICD-10-CM | POA: Diagnosis not present

## 2017-11-08 DIAGNOSIS — S6991XA Unspecified injury of right wrist, hand and finger(s), initial encounter: Secondary | ICD-10-CM | POA: Diagnosis not present

## 2017-11-08 DIAGNOSIS — W1830XA Fall on same level, unspecified, initial encounter: Secondary | ICD-10-CM | POA: Diagnosis not present

## 2017-11-08 DIAGNOSIS — M7989 Other specified soft tissue disorders: Secondary | ICD-10-CM | POA: Diagnosis not present

## 2017-11-10 ENCOUNTER — Encounter
Admission: RE | Admit: 2017-11-10 | Discharge: 2017-11-10 | Disposition: A | Discharge status: Home / Self Care | Payer: BLUE CROSS/BLUE SHIELD | Source: Ambulatory Visit | Attending: Orthopedic Surgery | Admitting: Orthopedic Surgery

## 2017-11-10 ENCOUNTER — Other Ambulatory Visit: Payer: Self-pay

## 2017-11-10 DIAGNOSIS — E785 Hyperlipidemia, unspecified: Secondary | ICD-10-CM | POA: Insufficient documentation

## 2017-11-10 DIAGNOSIS — E119 Type 2 diabetes mellitus without complications: Secondary | ICD-10-CM | POA: Diagnosis not present

## 2017-11-10 DIAGNOSIS — I10 Essential (primary) hypertension: Secondary | ICD-10-CM | POA: Diagnosis not present

## 2017-11-10 DIAGNOSIS — Z951 Presence of aortocoronary bypass graft: Secondary | ICD-10-CM | POA: Insufficient documentation

## 2017-11-10 DIAGNOSIS — I447 Left bundle-branch block, unspecified: Secondary | ICD-10-CM | POA: Insufficient documentation

## 2017-11-10 DIAGNOSIS — Z7982 Long term (current) use of aspirin: Secondary | ICD-10-CM | POA: Diagnosis not present

## 2017-11-10 DIAGNOSIS — G4733 Obstructive sleep apnea (adult) (pediatric): Secondary | ICD-10-CM | POA: Diagnosis not present

## 2017-11-10 DIAGNOSIS — Z79899 Other long term (current) drug therapy: Secondary | ICD-10-CM | POA: Diagnosis not present

## 2017-11-10 DIAGNOSIS — Z01818 Encounter for other preprocedural examination: Secondary | ICD-10-CM | POA: Diagnosis not present

## 2017-11-10 DIAGNOSIS — M1711 Unilateral primary osteoarthritis, right knee: Secondary | ICD-10-CM | POA: Diagnosis not present

## 2017-11-10 DIAGNOSIS — I2510 Atherosclerotic heart disease of native coronary artery without angina pectoris: Secondary | ICD-10-CM | POA: Insufficient documentation

## 2017-11-10 DIAGNOSIS — Z7984 Long term (current) use of oral hypoglycemic drugs: Secondary | ICD-10-CM | POA: Diagnosis not present

## 2017-11-10 DIAGNOSIS — I440 Atrioventricular block, first degree: Secondary | ICD-10-CM | POA: Insufficient documentation

## 2017-11-10 LAB — CBC
HCT: 47.7 % (ref 40.0–52.0)
Hemoglobin: 15.9 g/dL (ref 13.0–18.0)
MCH: 29.2 pg (ref 26.0–34.0)
MCHC: 33.3 g/dL (ref 32.0–36.0)
MCV: 87.7 fL (ref 80.0–100.0)
PLATELETS: 229 10*3/uL (ref 150–440)
RBC: 5.44 MIL/uL (ref 4.40–5.90)
RDW: 14.8 % — AB (ref 11.5–14.5)
WBC: 6.1 10*3/uL (ref 3.8–10.6)

## 2017-11-10 LAB — URINALYSIS, ROUTINE W REFLEX MICROSCOPIC
BACTERIA UA: NONE SEEN
Bilirubin Urine: NEGATIVE
Glucose, UA: >=500 mg/dL — AB
Ketones, ur: 5 mg/dL — AB
Leukocytes, UA: NEGATIVE
NITRITE: NEGATIVE
PH: 5 (ref 5.0–8.0)
Protein, ur: NEGATIVE mg/dL
RBC / HPF: NONE SEEN RBC/hpf (ref 0–5)
SPECIFIC GRAVITY, URINE: 1.023 (ref 1.005–1.030)

## 2017-11-10 LAB — PROTIME-INR
INR: 0.97
Prothrombin Time: 12.8 seconds (ref 11.4–15.2)

## 2017-11-10 LAB — COMPREHENSIVE METABOLIC PANEL
ALBUMIN: 4.3 g/dL (ref 3.5–5.0)
ALT: 16 U/L — ABNORMAL LOW (ref 17–63)
ANION GAP: 11 (ref 5–15)
AST: 17 U/L (ref 15–41)
Alkaline Phosphatase: 82 U/L (ref 38–126)
BILIRUBIN TOTAL: 0.8 mg/dL (ref 0.3–1.2)
BUN: 16 mg/dL (ref 6–20)
CHLORIDE: 102 mmol/L (ref 101–111)
CO2: 25 mmol/L (ref 22–32)
Calcium: 9.3 mg/dL (ref 8.9–10.3)
Creatinine, Ser: 0.92 mg/dL (ref 0.61–1.24)
GFR calc Af Amer: >60 mL/min (ref >60)
GFR calc non Af Amer: >60 mL/min (ref >60)
GLUCOSE: 145 mg/dL — AB (ref 65–99)
POTASSIUM: 3.5 mmol/L (ref 3.5–5.1)
SODIUM: 138 mmol/L (ref 135–145)
TOTAL PROTEIN: 7.3 g/dL (ref 6.5–8.1)

## 2017-11-10 LAB — TYPE AND SCREEN
ABO/RH(D): O POS
ANTIBODY SCREEN: NEGATIVE

## 2017-11-10 LAB — APTT: APTT: 29 s (ref 24–36)

## 2017-11-10 LAB — HEMOGLOBIN A1C
Hgb A1c MFr Bld: 7.4 % — ABNORMAL HIGH (ref 4.8–5.6)
MEAN PLASMA GLUCOSE: 165.68 mg/dL

## 2017-11-10 LAB — C-REACTIVE PROTEIN: CRP: <0.8 mg/dL (ref <1.0)

## 2017-11-10 LAB — SURGICAL PCR SCREEN
MRSA, PCR: NEGATIVE
STAPHYLOCOCCUS AUREUS: NEGATIVE

## 2017-11-10 LAB — SEDIMENTATION RATE: SED RATE: 5 mm/h (ref 0–20)

## 2017-11-10 NOTE — Patient Instructions (Signed)
Your procedure is scheduled on: Wednesday 11/23/17 Report to [facility]. 2ND FLOOR MEDICAL MALL ENTRANCE. To find out your arrival time please call [phone] between 1PM - 3PM on Tuesday 11/22/17.  Remember: Instructions that are not followed completely may result in serious medical risk, up to and including death, or upon the discretion of your surgeon and anesthesiologist your surgery may need to be rescheduled.    __X__ 1. Do not eat anything after midnight the night before your    procedure.  No gum chewing or hard candies.  You may drink clear   liquids up to 2 hours before you are scheduled to arrive at the   hospital for your procedure. Do not drink clear liquids within 2   hours of scheduled arrival to the hospital as this may lead to your   procedure being delayed or rescheduled.       Clear liquids include:   Water or Apple juice without pulp   Clear carbohydrate beverage such as Clearfast or Gatorade   Black coffee or Clear Tea (no milk, no creamer, do not add anything   to the coffee or tea)    Diabetics should only drink water   __X__ 2. No Alcohol for 24 hours before or after surgery.   ____ 3. Bring all medications with you on the day of surgery if instructed.    __X__ 4. Notify your doctor if there is any change in your medical condition     (cold, fever, infections).             __X___5. No smoking within 24 hours of your surgery.     Do not wear jewelry, make-up, hairpins, clips or nail polish.  Do not wear lotions, powders, or perfumes.   Do not shave 48 hours prior to surgery. Men may shave face and neck.  Do not bring valuables to the hospital.    [facility] is not responsible for any belongings or valuables.               Contacts, dentures or bridgework may not be worn into surgery.  Leave your suitcase in the car. After surgery it may be brought to your room.  For patients admitted to the hospital, discharge time is determined by your                 treatment team.   Patients discharged the day of surgery will not be allowed to drive home.   Please read over the following fact sheets that you were given:   MRSA Information   __X__ Take these medicines the morning of surgery with A SIP OF WATER:    1. LISINOPRIL  2.   3.   4.  5.  6.  ____ Fleet Enema (as directed)   __X__ Use CHG Soap/SAGE wipes as directed  ____ Use inhalers on the day of surgery  __X__ Stop metformin 2 days prior to surgery    ____ Take 1/2 of usual insulin dose the night before surgery and none on the morning of surgery.   __X__ Stop Coumadin/Plavix/aspirin on CONTACT DR [name] ABOUT WHEN IT IS OK TO STOP ASPIRIN  __X__ Stop Anti-inflammatories such as Advil, Aleve, Ibuprofen, Motrin, Naproxen, Naprosyn, Goodies,powder,.  OK to take Tylenol.   __X__ Stop supplements, Vitamin E, Fish Oil until after surgery.    __X__ Bring C-Pap to the hospital.

## 2017-11-11 ENCOUNTER — Telehealth: Payer: Self-pay | Admitting: Internal Medicine

## 2017-11-11 LAB — URINE CULTURE
Culture: NO GROWTH
Special Requests: NORMAL

## 2017-11-11 NOTE — Telephone Encounter (Signed)
Copied from [place]. Topic: Inquiry >> Nov 11, 2017  5:10 PM Neva Seat wrote: Pt is having a knee replacement at [facility] Dec 19th.  Wanting to know if he needs to continue to take Asprin   or if he needs to stop?  When would he need he would need to stop.  He also wants to know if he needs to take it during the surgery?

## 2017-11-16 NOTE — Telephone Encounter (Signed)
Left message on vm per DPR °

## 2017-11-16 NOTE — Telephone Encounter (Signed)
Please let him know that the surgeon would generally instruct him on this, but that he should probably stop the aspirin a week before the surgery. Generally, stronger blood thinners are used for at least 2 weeks after knee replacement to prevent blood clots. He should not resume his aspirin till he is done with whatever medication they give him for this

## 2017-11-21 ENCOUNTER — Other Ambulatory Visit: Payer: Self-pay | Admitting: Internal Medicine

## 2017-11-22 MED ORDER — TRANEXAMIC ACID 1000 MG/10ML IV SOLN
1000.0000 mg | INTRAVENOUS | Status: DC
  Filled 2017-11-22: qty 10

## 2017-11-22 MED ORDER — CEFAZOLIN SODIUM-DEXTROSE 2-4 GM/100ML-% IV SOLN: 2.0000 g | INTRAVENOUS | Status: DC

## 2017-11-22 NOTE — Telephone Encounter (Signed)
Last rx 10/19/17. Last OV 09/2017

## 2017-11-22 NOTE — Telephone Encounter (Signed)
Lm on pts vm and informed him Rx is available for pickup from the front desk 

## 2017-11-23 ENCOUNTER — Encounter: Payer: Self-pay | Admitting: Orthopedic Surgery

## 2017-11-23 ENCOUNTER — Other Ambulatory Visit: Payer: Self-pay

## 2017-11-23 ENCOUNTER — Encounter
Admission: RE | Disposition: A | Discharge status: Home Health | Payer: Self-pay | Source: Ambulatory Visit | Attending: Orthopedic Surgery

## 2017-11-23 ENCOUNTER — Inpatient Hospital Stay
Admission: RE | Admit: 2017-11-23 | Discharge: 2017-11-25 | DRG: 470 | Disposition: A | Discharge status: Home Health | Payer: BLUE CROSS/BLUE SHIELD | Source: Ambulatory Visit | Attending: Orthopedic Surgery | Admitting: Orthopedic Surgery

## 2017-11-23 ENCOUNTER — Inpatient Hospital Stay: Payer: BLUE CROSS/BLUE SHIELD

## 2017-11-23 ENCOUNTER — Inpatient Hospital Stay: Payer: BLUE CROSS/BLUE SHIELD | Admitting: Anesthesiology

## 2017-11-23 DIAGNOSIS — I25119 Atherosclerotic heart disease of native coronary artery with unspecified angina pectoris: Secondary | ICD-10-CM | POA: Diagnosis not present

## 2017-11-23 DIAGNOSIS — Z6834 Body mass index (BMI) 34.0-34.9, adult: Secondary | ICD-10-CM | POA: Diagnosis not present

## 2017-11-23 DIAGNOSIS — M1711 Unilateral primary osteoarthritis, right knee: Secondary | ICD-10-CM | POA: Diagnosis present

## 2017-11-23 DIAGNOSIS — I10 Essential (primary) hypertension: Secondary | ICD-10-CM | POA: Diagnosis present

## 2017-11-23 DIAGNOSIS — E669 Obesity, unspecified: Secondary | ICD-10-CM | POA: Diagnosis present

## 2017-11-23 DIAGNOSIS — E785 Hyperlipidemia, unspecified: Secondary | ICD-10-CM | POA: Diagnosis present

## 2017-11-23 DIAGNOSIS — E119 Type 2 diabetes mellitus without complications: Secondary | ICD-10-CM | POA: Diagnosis present

## 2017-11-23 DIAGNOSIS — Z471 Aftercare following joint replacement surgery: Secondary | ICD-10-CM | POA: Diagnosis not present

## 2017-11-23 DIAGNOSIS — Z96651 Presence of right artificial knee joint: Secondary | ICD-10-CM | POA: Diagnosis not present

## 2017-11-23 DIAGNOSIS — I2510 Atherosclerotic heart disease of native coronary artery without angina pectoris: Secondary | ICD-10-CM | POA: Diagnosis present

## 2017-11-23 DIAGNOSIS — K219 Gastro-esophageal reflux disease without esophagitis: Secondary | ICD-10-CM | POA: Diagnosis present

## 2017-11-23 DIAGNOSIS — G4733 Obstructive sleep apnea (adult) (pediatric): Secondary | ICD-10-CM | POA: Diagnosis present

## 2017-11-23 DIAGNOSIS — Z96659 Presence of unspecified artificial knee joint: Secondary | ICD-10-CM

## 2017-11-23 DIAGNOSIS — Z951 Presence of aortocoronary bypass graft: Secondary | ICD-10-CM | POA: Diagnosis not present

## 2017-11-23 HISTORY — PX: KNEE ARTHROPLASTY: SHX992

## 2017-11-23 LAB — GLUCOSE, CAPILLARY
GLUCOSE-CAPILLARY: 170 mg/dL — AB (ref 65–99)
Glucose-Capillary: 185 mg/dL — ABNORMAL HIGH (ref 65–99)
Glucose-Capillary: 144 mg/dL — ABNORMAL HIGH (ref 65–99)
Glucose-Capillary: 150 mg/dL — ABNORMAL HIGH (ref 65–99)

## 2017-11-23 SURGERY — ARTHROPLASTY, KNEE, TOTAL, USING IMAGELESS COMPUTER-ASSISTED NAVIGATION
Anesthesia: Spinal | Laterality: Right | Wound class: Clean

## 2017-11-23 MED ORDER — ONDANSETRON HCL 4 MG/2ML IJ SOLN: 4.0000 mg | Freq: Four times a day (QID) | INTRAMUSCULAR | Status: DC | PRN

## 2017-11-23 MED ORDER — ACETAMINOPHEN 10 MG/ML IV SOLN
INTRAVENOUS | Status: AC
  Filled 2017-11-23: qty 100

## 2017-11-23 MED ORDER — PROPOFOL 500 MG/50ML IV EMUL
INTRAVENOUS | Status: AC
  Filled 2017-11-23: qty 50

## 2017-11-23 MED ORDER — NEOMYCIN-POLYMYXIN B GU 40-200000 IR SOLN
Status: AC
  Filled 2017-11-23: qty 20

## 2017-11-23 MED ORDER — POTASSIUM CHLORIDE CRYS ER 20 MEQ PO TBCR: 20.0000 meq | EXTENDED_RELEASE_TABLET | Freq: Two times a day (BID) | ORAL | Status: DC | PRN

## 2017-11-23 MED ORDER — PIOGLITAZONE HCL 30 MG PO TABS
30.0000 mg | ORAL_TABLET | Freq: Every day | ORAL | Status: DC
  Administered 2017-11-23 – 2017-11-25 (×3): 30 mg via ORAL
  Filled 2017-11-23 (×3): qty 1

## 2017-11-23 MED ORDER — CEFAZOLIN SODIUM-DEXTROSE 2-3 GM-%(50ML) IV SOLR
INTRAVENOUS | Status: DC | PRN
  Administered 2017-11-23: 2 g via INTRAVENOUS

## 2017-11-23 MED ORDER — BUPIVACAINE HCL (PF) 0.5 % IJ SOLN
INTRAMUSCULAR | Status: AC
  Filled 2017-11-23: qty 10

## 2017-11-23 MED ORDER — ACETAMINOPHEN 10 MG/ML IV SOLN
INTRAVENOUS | Status: DC | PRN
  Administered 2017-11-23: 1000 mg via INTRAVENOUS

## 2017-11-23 MED ORDER — FERROUS SULFATE 325 (65 FE) MG PO TABS
325.0000 mg | ORAL_TABLET | Freq: Two times a day (BID) | ORAL | Status: DC
  Administered 2017-11-23 – 2017-11-25 (×4): 325 mg via ORAL
  Filled 2017-11-23 (×4): qty 1

## 2017-11-23 MED ORDER — FENTANYL CITRATE (PF) 100 MCG/2ML IJ SOLN: 25.0000 ug | INTRAMUSCULAR | Status: DC | PRN

## 2017-11-23 MED ORDER — SODIUM CHLORIDE 0.9 % IV SOLN
INTRAVENOUS | Status: DC | PRN
  Administered 2017-11-23: 60 mL

## 2017-11-23 MED ORDER — FLEET ENEMA 7-19 GM/118ML RE ENEM: 1.0000 | ENEMA | Freq: Once | RECTAL | Status: DC | PRN

## 2017-11-23 MED ORDER — SODIUM CHLORIDE 0.9 % IV SOLN
INTRAVENOUS | Status: DC
  Administered 2017-11-23 – 2017-11-24 (×2): via INTRAVENOUS

## 2017-11-23 MED ORDER — SODIUM CHLORIDE 0.9 % IV SOLN
INTRAVENOUS | Status: DC
  Administered 2017-11-23 (×2): via INTRAVENOUS

## 2017-11-23 MED ORDER — CEFAZOLIN SODIUM-DEXTROSE 2-4 GM/100ML-% IV SOLN
INTRAVENOUS | Status: AC
  Filled 2017-11-23: qty 100

## 2017-11-23 MED ORDER — FAMOTIDINE 20 MG PO TABS
20.0000 mg | ORAL_TABLET | Freq: Once | ORAL | Status: AC
  Administered 2017-11-23: 20 mg via ORAL

## 2017-11-23 MED ORDER — PANTOPRAZOLE SODIUM 40 MG PO TBEC
40.0000 mg | DELAYED_RELEASE_TABLET | Freq: Two times a day (BID) | ORAL | Status: DC
  Administered 2017-11-23 – 2017-11-25 (×4): 40 mg via ORAL
  Filled 2017-11-23 (×4): qty 1

## 2017-11-23 MED ORDER — ONDANSETRON HCL 4 MG PO TABS: 4.0000 mg | ORAL_TABLET | Freq: Four times a day (QID) | ORAL | Status: DC | PRN

## 2017-11-23 MED ORDER — METOCLOPRAMIDE HCL 10 MG PO TABS
10.0000 mg | ORAL_TABLET | Freq: Three times a day (TID) | ORAL | Status: DC
  Administered 2017-11-23 – 2017-11-25 (×7): 10 mg via ORAL
  Filled 2017-11-23 (×7): qty 1

## 2017-11-23 MED ORDER — GLYCOPYRROLATE 0.2 MG/ML IJ SOLN
INTRAMUSCULAR | Status: AC
  Filled 2017-11-23: qty 1

## 2017-11-23 MED ORDER — NEOMYCIN-POLYMYXIN B GU 40-200000 IR SOLN
Status: DC | PRN
  Administered 2017-11-23: 12 mL

## 2017-11-23 MED ORDER — MENTHOL 3 MG MT LOZG
1.0000 | LOZENGE | OROMUCOSAL | Status: DC | PRN
  Filled 2017-11-23: qty 9

## 2017-11-23 MED ORDER — MIDAZOLAM HCL 2 MG/2ML IJ SOLN
INTRAMUSCULAR | Status: AC
  Filled 2017-11-23: qty 2

## 2017-11-23 MED ORDER — SENNOSIDES-DOCUSATE SODIUM 8.6-50 MG PO TABS
1.0000 | ORAL_TABLET | Freq: Two times a day (BID) | ORAL | Status: DC
  Administered 2017-11-23 – 2017-11-24 (×3): 1 via ORAL
  Filled 2017-11-23 (×4): qty 1

## 2017-11-23 MED ORDER — GLYCOPYRROLATE 0.2 MG/ML IJ SOLN
INTRAMUSCULAR | Status: DC | PRN
  Administered 2017-11-23: 0.2 mg via INTRAVENOUS

## 2017-11-23 MED ORDER — FENTANYL CITRATE (PF) 100 MCG/2ML IJ SOLN
INTRAMUSCULAR | Status: AC
  Filled 2017-11-23: qty 2

## 2017-11-23 MED ORDER — CHLORHEXIDINE GLUCONATE 4 % EX LIQD: 60.0000 mL | Freq: Once | CUTANEOUS | Status: DC

## 2017-11-23 MED ORDER — MORPHINE SULFATE (PF) 2 MG/ML IV SOLN
2.0000 mg | INTRAVENOUS | Status: DC | PRN
Start: 2017-11-23 — End: 2017-11-25
  Administered 2017-11-23 – 2017-11-24 (×3): 2 mg via INTRAVENOUS
  Filled 2017-11-23 (×3): qty 1

## 2017-11-23 MED ORDER — DEXTROSE 5 % IV SOLN
2.0000 g | Freq: Four times a day (QID) | INTRAVENOUS | Status: AC
  Administered 2017-11-23 – 2017-11-24 (×4): 2 g via INTRAVENOUS
  Filled 2017-11-23 (×4): qty 2000

## 2017-11-23 MED ORDER — MAGNESIUM HYDROXIDE 400 MG/5ML PO SUSP
30.0000 mL | Freq: Every day | ORAL | Status: DC | PRN
  Administered 2017-11-24: 30 mL via ORAL
  Filled 2017-11-23: qty 30

## 2017-11-23 MED ORDER — TRAMADOL HCL 50 MG PO TABS
50.0000 mg | ORAL_TABLET | ORAL | Status: DC | PRN
  Administered 2017-11-23: 100 mg via ORAL
  Administered 2017-11-24: 50 mg via ORAL
  Administered 2017-11-24 – 2017-11-25 (×3): 100 mg via ORAL
  Filled 2017-11-23 (×4): qty 2
  Filled 2017-11-23: qty 1

## 2017-11-23 MED ORDER — BUPIVACAINE HCL (PF) 0.25 % IJ SOLN
INTRAMUSCULAR | Status: DC | PRN
  Administered 2017-11-23: 60 mL

## 2017-11-23 MED ORDER — ALUM & MAG HYDROXIDE-SIMETH 200-200-20 MG/5ML PO SUSP: 30.0000 mL | ORAL | Status: DC | PRN

## 2017-11-23 MED ORDER — PROPOFOL 500 MG/50ML IV EMUL
INTRAVENOUS | Status: DC | PRN
  Administered 2017-11-23: 60 ug/kg/min via INTRAVENOUS

## 2017-11-23 MED ORDER — DEXAMETHASONE SODIUM PHOSPHATE 4 MG/ML IJ SOLN
INTRAMUSCULAR | Status: DC | PRN
Start: 2017-11-23 — End: 2017-11-23
  Administered 2017-11-23: 5 mg via INTRAVENOUS

## 2017-11-23 MED ORDER — LISINOPRIL 10 MG PO TABS
10.0000 mg | ORAL_TABLET | Freq: Every day | ORAL | Status: DC
  Administered 2017-11-23 – 2017-11-25 (×2): 10 mg via ORAL
  Filled 2017-11-23 (×2): qty 1

## 2017-11-23 MED ORDER — CEFAZOLIN SODIUM-DEXTROSE 2-4 GM/100ML-% IV SOLN: 2.0000 g | Freq: Four times a day (QID) | INTRAVENOUS | Status: DC

## 2017-11-23 MED ORDER — ONDANSETRON HCL 4 MG/2ML IJ SOLN: 4.0000 mg | Freq: Once | INTRAMUSCULAR | Status: DC | PRN

## 2017-11-23 MED ORDER — FENTANYL CITRATE (PF) 100 MCG/2ML IJ SOLN
INTRAMUSCULAR | Status: DC | PRN
  Administered 2017-11-23: 50 ug via INTRAVENOUS
  Administered 2017-11-23 (×2): 25 ug via INTRAVENOUS

## 2017-11-23 MED ORDER — ACETAMINOPHEN 10 MG/ML IV SOLN
1000.0000 mg | Freq: Four times a day (QID) | INTRAVENOUS | Status: AC
  Administered 2017-11-23 – 2017-11-24 (×4): 1000 mg via INTRAVENOUS
  Filled 2017-11-23 (×4): qty 100

## 2017-11-23 MED ORDER — BUPIVACAINE HCL (PF) 0.25 % IJ SOLN
INTRAMUSCULAR | Status: AC
  Filled 2017-11-23: qty 60

## 2017-11-23 MED ORDER — ACETAMINOPHEN 325 MG PO TABS: 650.0000 mg | ORAL_TABLET | ORAL | Status: DC | PRN

## 2017-11-23 MED ORDER — INSULIN ASPART 100 UNIT/ML ~~LOC~~ SOLN
0.0000 [IU] | Freq: Three times a day (TID) | SUBCUTANEOUS | Status: DC
  Administered 2017-11-23 – 2017-11-24 (×2): 2 [IU] via SUBCUTANEOUS
  Administered 2017-11-24: 3 [IU] via SUBCUTANEOUS
  Administered 2017-11-25: 2 [IU] via SUBCUTANEOUS
  Filled 2017-11-23 (×4): qty 1

## 2017-11-23 MED ORDER — SODIUM CHLORIDE 0.9 % IV SOLN
INTRAVENOUS | Status: DC | PRN
  Administered 2017-11-23: 30 ug/min via INTRAVENOUS

## 2017-11-23 MED ORDER — BUPIVACAINE HCL (PF) 0.5 % IJ SOLN
INTRAMUSCULAR | Status: DC | PRN
Start: 2017-11-23 — End: 2017-11-23
  Administered 2017-11-23: 3 mL

## 2017-11-23 MED ORDER — FUROSEMIDE 40 MG PO TABS
40.0000 mg | ORAL_TABLET | Freq: Two times a day (BID) | ORAL | Status: DC | PRN
  Filled 2017-11-23: qty 1

## 2017-11-23 MED ORDER — SODIUM CHLORIDE 0.9 % IJ SOLN
INTRAMUSCULAR | Status: AC
  Filled 2017-11-23: qty 50

## 2017-11-23 MED ORDER — NITROGLYCERIN 0.4 MG SL SUBL: 0.4000 mg | SUBLINGUAL_TABLET | SUBLINGUAL | Status: DC | PRN

## 2017-11-23 MED ORDER — TRIAMCINOLONE ACETONIDE 0.1 % EX CREA
1.0000 "application " | TOPICAL_CREAM | Freq: Two times a day (BID) | CUTANEOUS | Status: DC | PRN
  Filled 2017-11-23: qty 15

## 2017-11-23 MED ORDER — SODIUM CHLORIDE 0.9 % IV SOLN
INTRAVENOUS | Status: DC | PRN
  Administered 2017-11-23: 1000 mg via INTRAVENOUS

## 2017-11-23 MED ORDER — DIPHENHYDRAMINE HCL 12.5 MG/5ML PO ELIX: 12.5000 mg | ORAL_SOLUTION | ORAL | Status: DC | PRN

## 2017-11-23 MED ORDER — ENOXAPARIN SODIUM 30 MG/0.3ML ~~LOC~~ SOLN
30.0000 mg | Freq: Two times a day (BID) | SUBCUTANEOUS | Status: DC
  Administered 2017-11-24 – 2017-11-25 (×3): 30 mg via SUBCUTANEOUS
  Filled 2017-11-23 (×3): qty 0.3

## 2017-11-23 MED ORDER — BUPIVACAINE LIPOSOME 1.3 % IJ SUSP
INTRAMUSCULAR | Status: AC
  Filled 2017-11-23: qty 20

## 2017-11-23 MED ORDER — BISACODYL 10 MG RE SUPP: 10.0000 mg | Freq: Every day | RECTAL | Status: DC | PRN

## 2017-11-23 MED ORDER — LIDOCAINE HCL (PF) 2 % IJ SOLN
INTRAMUSCULAR | Status: AC
  Filled 2017-11-23: qty 10

## 2017-11-23 MED ORDER — ACETAMINOPHEN 650 MG RE SUPP: 650.0000 mg | RECTAL | Status: DC | PRN

## 2017-11-23 MED ORDER — TRANEXAMIC ACID 1000 MG/10ML IV SOLN
1000.0000 mg | Freq: Once | INTRAVENOUS | Status: AC
  Administered 2017-11-23: 1000 mg via INTRAVENOUS
  Filled 2017-11-23: qty 10

## 2017-11-23 MED ORDER — PHENOL 1.4 % MT LIQD
1.0000 | OROMUCOSAL | Status: DC | PRN
  Filled 2017-11-23: qty 177

## 2017-11-23 MED ORDER — OXYCODONE HCL 5 MG PO TABS
10.0000 mg | ORAL_TABLET | ORAL | Status: DC | PRN
  Administered 2017-11-23 – 2017-11-25 (×9): 10 mg via ORAL
  Filled 2017-11-23 (×9): qty 2

## 2017-11-23 MED ORDER — OXYCODONE HCL 5 MG PO TABS
5.0000 mg | ORAL_TABLET | ORAL | Status: DC | PRN
  Administered 2017-11-23: 5 mg via ORAL
  Filled 2017-11-23: qty 1

## 2017-11-23 MED ORDER — FAMOTIDINE 20 MG PO TABS
ORAL_TABLET | ORAL | Status: AC
  Administered 2017-11-23: 20 mg via ORAL
  Filled 2017-11-23: qty 1

## 2017-11-23 MED ORDER — MIDAZOLAM HCL 5 MG/5ML IJ SOLN
INTRAMUSCULAR | Status: DC | PRN
  Administered 2017-11-23 (×2): 1 mg via INTRAVENOUS

## 2017-11-23 MED ORDER — CELECOXIB 200 MG PO CAPS
200.0000 mg | ORAL_CAPSULE | Freq: Two times a day (BID) | ORAL | Status: DC
  Administered 2017-11-24 – 2017-11-25 (×3): 200 mg via ORAL
  Filled 2017-11-23 (×3): qty 1

## 2017-11-23 MED ORDER — METFORMIN HCL 500 MG PO TABS
1000.0000 mg | ORAL_TABLET | Freq: Two times a day (BID) | ORAL | Status: DC
  Administered 2017-11-23 – 2017-11-25 (×4): 1000 mg via ORAL
  Filled 2017-11-23 (×4): qty 2

## 2017-11-23 MED ORDER — PROPOFOL 10 MG/ML IV BOLUS
INTRAVENOUS | Status: DC | PRN
  Administered 2017-11-23: 27 mg via INTRAVENOUS
  Administered 2017-11-23: 20 mg via INTRAVENOUS

## 2017-11-23 SURGICAL SUPPLY — 63 items
BATTERY INSTRU NAVIGATION (MISCELLANEOUS) ×12 IMPLANT
BLADE SAW 1 (BLADE) ×3 IMPLANT
BLADE SAW 1/2 (BLADE) ×3 IMPLANT
BLADE SAW 70X12.5 (BLADE) ×3 IMPLANT
BONE CEMENT GENTAMICIN (Cement) ×6 IMPLANT
CANISTER SUCT 1200ML W/VALVE (MISCELLANEOUS) ×3 IMPLANT
CANISTER SUCT 3000ML PPV (MISCELLANEOUS) ×6 IMPLANT
CAPT KNEE TOTAL 3 ATTUNE ×3 IMPLANT
CEMENT BONE GENTAMICIN 40 (Cement) ×2 IMPLANT
COOLER POLAR GLACIER W/PUMP (MISCELLANEOUS) ×3 IMPLANT
CUFF TOURN 30 STER DUAL PORT (MISCELLANEOUS) ×3 IMPLANT
DRAPE SHEET LG 3/4 BI-LAMINATE (DRAPES) ×3 IMPLANT
DRSG DERMACEA 8X12 NADH (GAUZE/BANDAGES/DRESSINGS) ×3 IMPLANT
DRSG OPSITE POSTOP 4X14 (GAUZE/BANDAGES/DRESSINGS) ×3 IMPLANT
DRSG TEGADERM 4X4.75 (GAUZE/BANDAGES/DRESSINGS) ×3 IMPLANT
DURAPREP 26ML APPLICATOR (WOUND CARE) ×6 IMPLANT
ELECT CAUTERY BLADE 6.4 (BLADE) ×3 IMPLANT
ELECT REM PT RETURN 9FT ADLT (ELECTROSURGICAL) ×3
ELECTRODE REM PT RTRN 9FT ADLT (ELECTROSURGICAL) ×1 IMPLANT
EVACUATOR 1/8 PVC DRAIN (DRAIN) ×3 IMPLANT
EX-PIN ORTHOLOCK NAV 4X150 (PIN) ×6 IMPLANT
GLOVE BIOGEL M STRL SZ7.5 (GLOVE) ×9 IMPLANT
GLOVE BIOGEL PI IND STRL 9 (GLOVE) ×1 IMPLANT
GLOVE BIOGEL PI INDICATOR 9 (GLOVE) ×2
GLOVE INDICATOR 8.0 STRL GRN (GLOVE) ×3 IMPLANT
GLOVE SURG SYN 9.0  PF PI (GLOVE) ×6
GLOVE SURG SYN 9.0 PF PI (GLOVE) ×3 IMPLANT
GOWN STRL REUS W/ TWL LRG LVL3 (GOWN DISPOSABLE) ×2 IMPLANT
GOWN STRL REUS W/TWL 2XL LVL3 (GOWN DISPOSABLE) ×3 IMPLANT
GOWN STRL REUS W/TWL LRG LVL3 (GOWN DISPOSABLE) ×4
HOLDER FOLEY CATH W/STRAP (MISCELLANEOUS) ×3 IMPLANT
HOOD PEEL AWAY FLYTE STAYCOOL (MISCELLANEOUS) ×6 IMPLANT
KIT RM TURNOVER STRD PROC AR (KITS) ×3 IMPLANT
KNIFE SCULPS 14X20 (INSTRUMENTS) ×3 IMPLANT
LABEL OR SOLS (LABEL) ×3 IMPLANT
NDL SAFETY ECLIPSE 18X1.5 (NEEDLE) ×1 IMPLANT
NEEDLE HYPO 18GX1.5 SHARP (NEEDLE) ×2
NEEDLE SPNL 20GX3.5 QUINCKE YW (NEEDLE) ×6 IMPLANT
NS IRRIG 500ML POUR BTL (IV SOLUTION) ×3 IMPLANT
PACK TOTAL KNEE (MISCELLANEOUS) ×3 IMPLANT
PAD WRAPON POLAR KNEE (MISCELLANEOUS) ×1 IMPLANT
PIN DRILL QUICK PACK ×3 IMPLANT
PIN FIXATION 1/8DIA X 3INL (PIN) ×3 IMPLANT
PULSAVAC PLUS IRRIG FAN TIP (DISPOSABLE) ×3
SOL .9 NS 3000ML IRR  AL (IV SOLUTION) ×2
SOL .9 NS 3000ML IRR UROMATIC (IV SOLUTION) ×1 IMPLANT
SOL PREP PVP 2OZ (MISCELLANEOUS) ×3
SOLUTION PREP PVP 2OZ (MISCELLANEOUS) ×1 IMPLANT
SPONGE DRAIN TRACH 4X4 STRL 2S (GAUZE/BANDAGES/DRESSINGS) ×3 IMPLANT
STAPLER SKIN PROX 35W (STAPLE) ×3 IMPLANT
STRAP TIBIA SHORT (MISCELLANEOUS) ×3 IMPLANT
SUCTION FRAZIER HANDLE 10FR (MISCELLANEOUS) ×2
SUCTION TUBE FRAZIER 10FR DISP (MISCELLANEOUS) ×1 IMPLANT
SUT VIC AB 0 CT1 36 (SUTURE) ×3 IMPLANT
SUT VIC AB 1 CT1 36 (SUTURE) ×6 IMPLANT
SUT VIC AB 2-0 CT2 27 (SUTURE) ×3 IMPLANT
SYR 20CC LL (SYRINGE) ×3 IMPLANT
SYR 30ML LL (SYRINGE) ×6 IMPLANT
TIP FAN IRRIG PULSAVAC PLUS (DISPOSABLE) ×1 IMPLANT
TOWEL OR 17X26 4PK STRL BLUE (TOWEL DISPOSABLE) ×3 IMPLANT
TOWER CARTRIDGE SMART MIX (DISPOSABLE) ×3 IMPLANT
TRAY FOLEY W/METER SILVER 16FR (SET/KITS/TRAYS/PACK) ×3 IMPLANT
WRAPON POLAR PAD KNEE (MISCELLANEOUS) ×3

## 2017-11-23 NOTE — Progress Notes (Signed)
Patient admitted to room 134. Patient is alert and oriented x4. Patient verbalizes numbness on LE. Verbalizes some discomfort on back. VS stable. Skin assessment and admission completed. Bone foam in place. Patient was educated on keeping leg/knee straight and no pillows under it. Patient oriented to room and call light system. Bed in lowest position.

## 2017-11-23 NOTE — H&P (Signed)
The patient has been re-examined, and the chart reviewed, and there have been no interval changes to the documented history and physical.    The risks, benefits, and alternatives have been discussed at length. The patient expressed understanding of the risks benefits and agreed with plans for surgical intervention.  Maris Abascal P. Angela Platner, Jr. M.D.    

## 2017-11-23 NOTE — Discharge Instructions (Signed)
°  Instructions after Total Knee Replacement ° ° Shayda Kalka P. Nyellie Yetter, Jr., M.D.    ° Dept. of Orthopaedics & Sports Medicine ° [facility] ° [address] ° , [place]  [address] ° Phone: [phone]   Fax: [phone] ° °  °DIET: °• Drink plenty of non-alcoholic fluids. °• Resume your normal diet. Include foods high in fiber. ° °ACTIVITY:  °• You may use crutches or a walker with weight-bearing as tolerated, unless instructed otherwise. °• You may be weaned off of the walker or crutches by your Physical Therapist.  °• Do NOT place pillows under the knee. Anything placed under the knee could limit your ability to straighten the knee.   °• Continue doing gentle exercises. Exercising will reduce the pain and swelling, increase motion, and prevent muscle weakness.   °• Please continue to use the TED compression stockings for 6 weeks. You may remove the stockings at night, but should reapply them in the morning. °• Do not drive or operate any equipment until instructed. ° °WOUND CARE:  °• Continue to use the PolarCare or ice packs periodically to reduce pain and swelling. °• You may bathe or shower after the staples are removed at the first office visit following surgery. ° °MEDICATIONS: °• You may resume your regular medications. °• Please take the pain medication as prescribed on the medication. °• Do not take pain medication on an empty stomach. °• You have been given a prescription for a blood thinner (Lovenox or Coumadin). Please take the medication as instructed. (NOTE: After completing a 2 week course of Lovenox, take one Enteric-coated aspirin once a day. This along with elevation will help reduce the possibility of phlebitis in your operated leg.) °• Do not drive or drink alcoholic beverages when taking pain medications. ° °CALL THE OFFICE FOR: °• Temperature above 101 degrees °• Excessive bleeding or drainage on the dressing. °• Excessive swelling, coldness, or paleness of the toes. °• Persistent  nausea and vomiting. ° °FOLLOW-UP:  °• You should have an appointment to return to the office in 10-14 days after surgery. °• Arrangements have been made for continuation of Physical Therapy (either home therapy or outpatient therapy). °  °

## 2017-11-23 NOTE — Anesthesia Preprocedure Evaluation (Addendum)
Anesthesia Evaluation  Patient identified by MRN, date of birth, ID band Patient awake    Reviewed: Allergy & Precautions, NPO status , Patient's Chart, lab work & pertinent test results, reviewed documented beta blocker date and time   Airway Mallampati: III  TM Distance: >3 FB     Dental  (+) Chipped, Upper Dentures, Lower Dentures   Pulmonary shortness of breath, sleep apnea and Continuous Positive Airway Pressure Ventilation ,           Cardiovascular hypertension, Pt. on medications + angina + CAD and + CABG       Neuro/Psych  Neuromuscular disease    GI/Hepatic GERD  Controlled,  Endo/Other  diabetes, Type 2  Renal/GU Renal disease     Musculoskeletal  (+) Arthritis ,   Abdominal   Peds  Hematology   Anesthesia Other Findings Obese.  Reproductive/Obstetrics                            Anesthesia Physical Anesthesia Plan  ASA: III  Anesthesia Plan: Spinal   Post-op Pain Management:    Induction:   PONV Risk Score and Plan:   Airway Management Planned:   Additional Equipment:   Intra-op Plan:   Post-operative Plan:   Informed Consent: I have reviewed the patients History and Physical, chart, labs and discussed the procedure including the risks, benefits and alternatives for the proposed anesthesia with the patient or authorized representative who has indicated his/her understanding and acceptance.     Plan Discussed with: CRNA  Anesthesia Plan Comments:         Anesthesia Quick Evaluation

## 2017-11-23 NOTE — Anesthesia Procedure Notes (Signed)
Spinal  Patient location during procedure: OR Start time: 11/23/2017 11:25 AM End time: 11/23/2017 11:33 AM Staffing Performed: resident/CRNA  Preanesthetic Checklist Completed: patient identified, site marked, surgical consent, pre-op evaluation, timeout performed, IV checked, risks and benefits discussed and monitors and equipment checked Spinal Block Patient position: sitting Prep: ChloraPrep Patient monitoring: heart rate, continuous pulse ox, blood pressure and cardiac monitor Approach: midline Location: L4-5 Injection technique: single-shot Needle Needle type: Introducer and Pencan  Needle gauge: 24 G Needle length: 9 cm Additional Notes Brief paresthesia on left relieved by needle withdrawal and redirection.. Negative blood return. Positive free-flowing CSF. Expiration date of kit checked and confirmed. Patient tolerated procedure well, without complications.

## 2017-11-23 NOTE — Anesthesia Post-op Follow-up Note (Signed)
Anesthesia QCDR form completed.        

## 2017-11-23 NOTE — Op Note (Signed)
OPERATIVE NOTE  DATE OF SURGERY:  11/23/2017  PATIENT NAME:  [name] [name]   DOB: [birth year]  MRN: [id number]  PRE-OPERATIVE DIAGNOSIS: Degenerative arthrosis of the right knee, primary  POST-OPERATIVE DIAGNOSIS:  Same  PROCEDURE:  Right total knee arthroplasty using computer-assisted navigation  SURGEON:  Marciano Sequin. M.D.  ASSISTANT:  Vance Peper, PA (present and scrubbed throughout the case, critical for assistance with exposure, retraction, instrumentation, and closure)  ANESTHESIA: spinal  ESTIMATED BLOOD LOSS: 50 mL  FLUIDS REPLACED: 1400 mL of crystalloid  TOURNIQUET TIME: 110 minutes  DRAINS: 2 medium Hemovac drains  SOFT TISSUE RELEASES: Anterior cruciate ligament, posterior cruciate ligament, deep and superficial medial collateral ligament, patellofemoral ligament  IMPLANTS UTILIZED: DePuy Attune size 6 posterior stabilized femoral component (cemented), size 6 rotating platform tibial component (cemented), 38 mm medialized dome patella (cemented), and a 5 mm stabilized rotating platform polyethylene insert.  INDICATIONS FOR SURGERY: [name] [name] is a 66 y.o. year old male with a long history of progressive knee pain. X-rays demonstrated severe degenerative changes in tricompartmental fashion. The patient had not seen any significant improvement despite conservative nonsurgical intervention. After discussion of the risks and benefits of surgical intervention, the patient expressed understanding of the risks benefits and agree with plans for total knee arthroplasty.   The risks, benefits, and alternatives were discussed at length including but not limited to the risks of infection, bleeding, nerve injury, stiffness, blood clots, the need for revision surgery, cardiopulmonary complications, among others, and they were willing to proceed.  PROCEDURE IN DETAIL: The patient was brought into the operating room and, after adequate spinal anesthesia was  achieved, a tourniquet was placed on the patient's upper thigh. The patient's knee and leg were cleaned and prepped with alcohol and DuraPrep and draped in the usual sterile fashion. A "timeout" was performed as per usual protocol. The lower extremity was exsanguinated using an Esmarch, and the tourniquet was inflated to 300 mmHg. An anterior longitudinal incision was made followed by a standard mid vastus approach. The deep fibers of the medial collateral ligament were elevated in a subperiosteal fashion off of the medial flare of the tibia so as to maintain a continuous soft tissue sleeve. The patella was subluxed laterally and the patellofemoral ligament was incised. Inspection of the knee demonstrated severe degenerative changes with full-thickness loss of articular cartilage. Osteophytes were debrided using a rongeur. Anterior and posterior cruciate ligaments were excised. Two 4.0 mm Schanz pins were inserted in the femur and into the tibia for attachment of the array of trackers used for computer-assisted navigation. Hip center was identified using a circumduction technique. Distal landmarks were mapped using the computer. The distal femur and proximal tibia were mapped using the computer. The distal femoral cutting guide was positioned using computer-assisted navigation so as to achieve a 5 distal valgus cut. The femur was sized and it was felt that a size 6 femoral component was appropriate. A size 6 femoral cutting guide was positioned and the anterior cut was performed and verified using the computer. This was followed by completion of the posterior and chamfer cuts. Femoral cutting guide for the central box was then positioned in the center box cut was performed.  Attention was then directed to the proximal tibia. Medial and lateral menisci were excised. The extramedullary tibial cutting guide was positioned using computer-assisted navigation so as to achieve a 0 varus-valgus alignment and 3  posterior slope. The cut was performed and verified using the computer. The  proximal tibia was sized and it was felt that a size 6 tibial tray was appropriate. Tibial and femoral trials were inserted followed by insertion of a 5 mm polyethylene insert. The knee was felt to be tight medially. A Cobb elevator was used to elevate the superficial fibers of the medial collateral ligament.  The knee was still felt to be tight in extension.  The trial components were removed and an additional 4 mm of bone was resected from the proximal tibia and confirmed using the computer.  The trial components were reinserted followed by insertion of a 5 mm polyethylene insert.  This allowed for excellent mediolateral soft tissue balancing both in flexion and in full extension. Finally, the patella was cut and prepared so as to accommodate a 38 mm medialized dome patella. A patella trial was placed and the knee was placed through a range of motion with excellent patellar tracking appreciated. The femoral trial was removed after debridement of posterior osteophytes. The central post-hole for the tibial component was reamed followed by insertion of a keel punch. Tibial trials were then removed. Cut surfaces of bone were irrigated with copious amounts of normal saline with antibiotic solution using pulsatile lavage and then suctioned dry. Polymethylmethacrylate cement with gentamicin was prepared in the usual fashion using a vacuum mixer. Cement was applied to the cut surface of the proximal tibia as well as along the undersurface of a size 6 rotating platform tibial component. Tibial component was positioned and impacted into place. Excess cement was removed using [employer] fast streamer. Cement was then applied to the cut surfaces of the femur as well as along the posterior flanges of the size 6 femoral component. The femoral component was positioned and impacted into place. Excess cement was removed using [employer] fast streamer. A 5 mm polyethylene  trial was inserted and the knee was brought into full extension with steady axial compression applied. Finally, cement was applied to the backside of a 38 mm medialized dome patella and the patellar component was positioned and patellar clamp applied. Excess cement was removed using [employer] fast streamer. After adequate curing of the cement, the tourniquet was deflated after a total tourniquet time of 110 minutes. Hemostasis was achieved using electrocautery. The knee was irrigated with copious amounts of normal saline with antibiotic solution using pulsatile lavage and then suctioned dry. 20 mL of 1.3% Exparel and 60 mL of 0.25% Marcaine in 40 mL of normal saline was injected along the posterior capsule, medial and lateral gutters, and along the arthrotomy site. A 5 mm stabilized rotating platform polyethylene insert was inserted and the knee was placed through a range of motion with excellent mediolateral soft tissue balancing appreciated and excellent patellar tracking noted. 2 medium drains were placed in the wound bed and brought out through separate stab incisions. The medial parapatellar portion of the incision was reapproximated using interrupted sutures of #1 Vicryl. Subcutaneous tissue was approximated in layers using first #0 Vicryl followed #2-0 Vicryl. The skin was approximated with skin staples. A sterile dressing was applied.  The patient tolerated the procedure well and was transported to the recovery room in stable condition.    [name] [name]., M.D.

## 2017-11-23 NOTE — Transfer of Care (Signed)
Immediate Anesthesia Transfer of Care Note  Patient: [name] [name]  Procedure(s) Performed: COMPUTER ASSISTED TOTAL KNEE ARTHROPLASTY (Right )  Patient Location: PACU  Anesthesia Type:Spinal  Level of Consciousness: sedated  Airway & Oxygen Therapy: Patient Spontanous Breathing and Patient connected to nasal cannula oxygen  Post-op Assessment: Report given to RN and Post -op Vital signs reviewed and stable  Post vital signs: Reviewed and stable  Last Vitals:  Vitals:   11/23/17 1018 11/23/17 1516  BP: (!) 155/75 (P) 123/73  Pulse: 66 74  Resp: 17 20  Temp: 36.6 C (P) 36.8 C  SpO2: 97% 99%    Last Pain:  Vitals:   11/23/17 1018  TempSrc: Oral  PainSc: 3          Complications: No apparent anesthesia complications

## 2017-11-24 ENCOUNTER — Encounter: Payer: Self-pay | Admitting: Orthopedic Surgery

## 2017-11-24 LAB — GLUCOSE, CAPILLARY
GLUCOSE-CAPILLARY: 150 mg/dL — AB (ref 65–99)
GLUCOSE-CAPILLARY: 151 mg/dL — AB (ref 65–99)
Glucose-Capillary: 107 mg/dL — ABNORMAL HIGH (ref 65–99)
Glucose-Capillary: 110 mg/dL — ABNORMAL HIGH (ref 65–99)

## 2017-11-24 LAB — BASIC METABOLIC PANEL
Anion gap: 5 (ref 5–15)
BUN: 13 mg/dL (ref 6–20)
CO2: 24 mmol/L (ref 22–32)
CREATININE: 0.94 mg/dL (ref 0.61–1.24)
Calcium: 8.3 mg/dL — ABNORMAL LOW (ref 8.9–10.3)
Chloride: 106 mmol/L (ref 101–111)
GFR calc Af Amer: >60 mL/min (ref >60)
Glucose, Bld: 144 mg/dL — ABNORMAL HIGH (ref 65–99)
Potassium: 3.5 mmol/L (ref 3.5–5.1)
SODIUM: 135 mmol/L (ref 135–145)

## 2017-11-24 LAB — ABO/RH: ABO/RH(D): O POS

## 2017-11-24 MED ORDER — ENOXAPARIN SODIUM 40 MG/0.4ML ~~LOC~~ SOLN: 40.0000 mg | SUBCUTANEOUS | 0 refills | Status: DC

## 2017-11-24 MED ORDER — TRAMADOL HCL 50 MG PO TABS: 50.0000 mg | ORAL_TABLET | ORAL | 0 refills | Status: DC | PRN

## 2017-11-24 MED ORDER — OXYCODONE HCL 5 MG PO TABS: 5.0000 mg | ORAL_TABLET | ORAL | 0 refills | Status: DC | PRN

## 2017-11-24 NOTE — Progress Notes (Signed)
   Subjective: 1 Day Post-Op Procedure(s) (LRB): COMPUTER ASSISTED TOTAL KNEE ARTHROPLASTY (Right) Patient reports pain as 7 on 0-10 scale.  However patient does not appear to be in a lot of discomfort. States that he had pain off and on during the night Patient is well, and has had no acute complaints or problems We will start therapy today. Patient refused to dangle his leg off the side of the bed during the night for the nurses. Patient also refusing bone foam secondary to increased pain Plan is to go Home after hospital stay. no nausea and no vomiting Patient denies any chest pains or shortness of breath. Objective: Vital signs in last 24 hours: Temp:  [97.6 F (36.4 C)-98.6 F (37 C)] 98.2 F (36.8 C) (12/20 0350) Pulse Rate:  [58-74] 58 (12/20 0350) Resp:  [10-19] 19 (12/20 0350) BP: (103-155)/(54-90) 103/54 (12/20 0350) SpO2:  [96 %-100 %] 97 % (12/20 0350) Weight:  [93.4 kg (206 lb)] 93.4 kg (206 lb) (12/19 1018) Heels are non tender and elevated off the bed using rolled towels Intake/Output from previous day: 12/19 0701 - 12/20 0700 In: 3168.3 [P.O.:1440; I.V.:1728.3] Out: 3360 [Urine:3100; Drains:210; Blood:50] Intake/Output this shift: No intake/output data recorded.  No results for input(s): HGB in the last 72 hours. No results for input(s): WBC, RBC, HCT, PLT in the last 72 hours. No results for input(s): NA, K, CL, CO2, BUN, CREATININE, GLUCOSE, CALCIUM in the last 72 hours. No results for input(s): LABPT, INR in the last 72 hours.  EXAM General - Patient is Alert, Appropriate and Oriented Extremity - Neurologically intact Neurovascular intact Sensation intact distally Intact pulses distally Dorsiflexion/Plantar flexion intact Compartment soft Dressing - dressing C/D/I Motor Function - intact, moving foot and toes well on exam. Patient is able to do straight leg raise on his own  Past Medical History:  Diagnosis Date  . Angina   . Arthritis   . BPH  (benign prostatic hypertrophy)   . Carpal tunnel syndrome   . Coronary artery disease 11/12/11   s/p CABG x 3  . Diabetes mellitus    type 2  . GERD (gastroesophageal reflux disease)   . Hx of colonic polyps   . Hyperlipidemia   . Hypertension   . Kidney stones   . OSA (obstructive sleep apnea)    uses cpap  . Shortness of breath     Assessment/Plan: 1 Day Post-Op Procedure(s) (LRB): COMPUTER ASSISTED TOTAL KNEE ARTHROPLASTY (Right) Active Problems:   S/P total knee arthroplasty  Estimated body mass index is 34.28 kg/m as calculated from the following:   Height as of this encounter: 5\' 5"  (1.651 m).   Weight as of this encounter: 93.4 kg (206 lb). Advance diet Up with therapy D/C IV fluids Plan for discharge tomorrow Discharge home with home health  Labs: None DVT Prophylaxis - Lovenox, Foot Pumps and TED hose Weight-Bearing as tolerated to right leg D/C O2 and Pulse OX and try on Room Air Begin working on bowel movement  Jon R. [place] 11/24/2017, 7:08 AM

## 2017-11-24 NOTE — Evaluation (Signed)
Physical Therapy Evaluation Patient Details Name: [name] [name] MRN: [id number] DOB: 1951/03/31 Today's Date: 11/24/2017   History of Present Illness  Pt is a 66 y.o. male s/p R TKA secondary to primary degenerative arthrosis 11/23/17.  PMH includes sleep apnea, CPAP, DM, htn, angina, CABG, obesity.  Clinical Impression  Prior to hospital admission, pt was independent with functional mobility.  Pt lives with his wife on main floor of home with 6 STE with B railings.  Currently pt is SBA supine to sit; CGA with transfers; and CGA ambulating 20 feet with RW.  Pt's R knee flexion AROM to 85 degrees and pt able to perform R LE SLR x10 reps independently (no KI utilized during session).  Pt would benefit from skilled PT to address noted impairments and functional limitations (see below for any additional details).  Upon hospital discharge, recommend pt discharge to home with HHPT and support of spouse.    Follow Up Recommendations Home health PT    Equipment Recommendations  Rolling walker with 5" wheels    Recommendations for Other Services       Precautions / Restrictions Precautions Precautions: Fall;Knee Required Braces or Orthoses: Knee Immobilizer - Right Knee Immobilizer - Right: Discontinue once straight leg raise with < 10 degree lag Restrictions Weight Bearing Restrictions: Yes RLE Weight Bearing: Weight bearing as tolerated      Mobility  Bed Mobility Overal bed mobility: Needs Assistance Bed Mobility: Supine to Sit     Supine to sit: Supervision;HOB elevated     General bed mobility comments: increased effort to perform on own  Transfers Overall transfer level: Needs assistance Equipment used: Rolling walker (2 wheeled) Transfers: Sit to/from [facility] Sit to Stand: Min guard Stand pivot transfers: Min guard       General transfer comment: CGA for safety with transfer from bed and recliner; initial vc's for hand and LE positioning but  then no further cues required  Ambulation/Gait Ambulation/Gait assistance: Min guard Ambulation Distance (Feet): 20 Feet Assistive device: Rolling walker (2 wheeled)   Gait velocity: decreased   General Gait Details: vc's for gait technique; increased UE support through RW to offweight R LE; intermittent vc's for R quad set during R stance phase required  Stairs            Wheelchair Mobility    Modified Rankin (Stroke Patients Only)       Balance Overall balance assessment: Needs assistance;History of Falls Sitting-balance support: No upper extremity supported;Feet supported Sitting balance-Leahy Scale: Good Sitting balance - Comments: steady sitting reaching within BOS   Standing balance support: Single extremity supported Standing balance-Leahy Scale: Poor Standing balance comment: requires at least single UE support for static standing balance                             Pertinent Vitals/Pain Pain Assessment: 0-10 Pain Score: 6  Pain Location: R knee Pain Descriptors / Indicators: Tender;Sore Pain Intervention(s): Limited activity within patient's tolerance;Monitored during session;Premedicated before session;Repositioned;Ice applied  Vitals (HR and O2 on room air) stable and WFL throughout treatment session.    Home Living Family/patient expects to be discharged to:: Private residence Living Arrangements: Spouse/significant other Available Help at Discharge: Family Type of Home: House Home Access: Stairs to enter Entrance Stairs-Rails: Psychiatric nurse of Steps: [place]: Two level;Able to live on main level with bedroom/bathroom Home Equipment: Shower seat - built in;Shower seat  Prior Function Level of Independence: Independent         Comments: Pt reports 2 falls in past 3 weeks d/t R knee issues (fell hitting R hand and L elbow area).     Hand Dominance        Extremity/Trunk Assessment   Upper  Extremity Assessment Upper Extremity Assessment: Overall WFL for tasks assessed    Lower Extremity Assessment Lower Extremity Assessment: RLE deficits/detail;LLE deficits/detail RLE Deficits / Details: able to perform R LE SLR independently x10 times; good DF/PF LLE Deficits / Details: strength and ROM WFL L LE       Communication   Communication: No difficulties  Cognition Arousal/Alertness: Awake/alert Behavior During Therapy: WFL for tasks assessed/performed Overall Cognitive Status: Within Functional Limits for tasks assessed                                        General Comments General comments (skin integrity, edema, etc.): Pt resting in bed with hemovac and polar care in place.   Pt agreeable to PT session.  Pt's wife present during session.    Exercises Total Joint Exercises Ankle Circles/Pumps: AROM;Strengthening;Both;10 reps;Supine Quad Sets: AROM;Strengthening;Both;10 reps;Supine Short Arc Quad: AROM;Strengthening;Right;10 reps;Supine Heel Slides: AAROM;Strengthening;Right;10 reps;Supine Hip ABduction/ADduction: AROM;Strengthening;Right;10 reps;Supine Straight Leg Raises: AROM;Strengthening;Right;10 reps;Supine Goniometric ROM: R knee extension 10 degrees short of neutral semi-supine in bed; R knee flexion 85 degrees in sitting   Assessment/Plan    PT Assessment Patient needs continued PT services  PT Problem List Decreased strength;Decreased range of motion;Decreased activity tolerance;Decreased balance;Decreased mobility;Decreased knowledge of use of DME;Decreased knowledge of precautions;Pain       PT Treatment Interventions DME instruction;Gait training;Stair training;Functional mobility training;Therapeutic activities;Therapeutic exercise;Balance training;Patient/family education    PT Goals (Current goals can be found in the Care Plan section)  Acute Rehab PT Goals Patient Stated Goal: to go home PT Goal Formulation: With  patient/family Time For Goal Achievement: 12/08/17 Potential to Achieve Goals: Good    Frequency BID   Barriers to discharge        Co-evaluation               AM-PAC PT "6 Clicks" Daily Activity  Outcome Measure Difficulty turning over in bed (including adjusting bedclothes, sheets and blankets)?: A Little Difficulty moving from lying on back to sitting on the side of the bed? : A Lot Difficulty sitting down on and standing up from a chair with arms (e.g., wheelchair, bedside commode, etc,.)?: A Little Help needed moving to and from a bed to chair (including a wheelchair)?: A Little Help needed walking in hospital room?: A Little Help needed climbing 3-5 steps with a railing? : A Little 6 Click Score: 17    End of Session Equipment Utilized During Treatment: Gait belt Activity Tolerance: Patient tolerated treatment well Patient left: in chair;with call bell/phone within reach;with chair alarm set;with family/visitor present;with SCD's reapplied(B heels elevated via towel rolls; polar care in place and activated) Nurse Communication: Mobility status;Precautions;Weight bearing status PT Visit Diagnosis: Other abnormalities of gait and mobility (R26.89);Muscle weakness (generalized) (M62.81);History of falling (Z91.81);Pain Pain - Right/Left: Right Pain - part of body: Knee    Time: 2025-4270 PT Time Calculation (min) (ACUTE ONLY): 44 min   Charges:   PT Evaluation $PT Eval Low Complexity: 1 Low PT Treatments $Gait Training: 8-22 mins $Therapeutic Exercise: 8-22 mins   PT G Codes:   PT  G-Codes **NOT FOR INPATIENT CLASS** Functional Assessment Tool Used: AM-PAC 6 Clicks Basic Mobility Functional Limitation: Mobility: Walking and moving around Mobility: Walking and Moving Around Current Status [phone]): At least 40 percent but less than 60 percent impaired, limited or restricted Mobility: Walking and Moving Around Goal Status [phone]): 0 percent impaired, limited or  restricted    Leitha Bleak, PT 11/24/17, 3:25 PM [phone]

## 2017-11-24 NOTE — Progress Notes (Signed)
Patient alert and oriented x 4. Plan of care discussed with patient. Patient refuses to wear bone foam due to pain. Patient refuses to dangle due to pain despite frequent attempts after pain medication given and patient being awaken to administer pain medication.  Patient given PRN and scheduled pain medication. No acute distress noted. Patient eating and tolerating diet. Vitals WNL.

## 2017-11-24 NOTE — Progress Notes (Signed)
Physical Therapy Treatment Patient Details Name: [name] [name] MRN: [id number] DOB: [birth year] Today's Date: 11/24/2017    History of Present Illness Pt is a 66 y.o. male s/p R TKA secondary to primary degenerative arthrosis 11/23/17.  PMH includes sleep apnea, CPAP, DM, htn, angina, CABG, obesity.    PT Comments    Pt able to progress to ambulating 120 feet with RW (CGA to SBA).  Pt demonstrates good carryover of cueing for transfers and ambulation from this morning.  Pain 6/10 R knee beginning, during, and end of session.  Will continue to focus on strengthening, R knee ROM, gait training, and trial stairs next session.    Follow Up Recommendations  Home health PT     Equipment Recommendations  Rolling walker with 5" wheels    Recommendations for Other Services       Precautions / Restrictions Precautions Precautions: Fall;Knee Precaution Booklet Issued: Yes (comment) Required Braces or Orthoses: Knee Immobilizer - Right Knee Immobilizer - Right: Discontinue once straight leg raise with < 10 degree lag Restrictions Weight Bearing Restrictions: Yes RLE Weight Bearing: Weight bearing as tolerated    Mobility  Bed Mobility Overal bed mobility: Needs Assistance Bed Mobility: Supine to Sit     Supine to sit: Supervision;HOB elevated     General bed mobility comments: Deferred d/t pt up in recliner beginning and end of session.  Transfers Overall transfer level: Needs assistance Equipment used: Rolling walker (2 wheeled) Transfers: Sit to/from [facility] Sit to Stand: Supervision Stand pivot transfers: Supervision       General transfer comment: SBA for safety; stready strong transfers with RW  Ambulation/Gait Ambulation/Gait assistance: Min guard;Supervision Ambulation Distance (Feet): 120 Feet Assistive device: Rolling walker (2 wheeled)   Gait velocity: decreased   General Gait Details: occasional vc's for gait technique; increased  UE support through RW to offweight R LE; occasional vc's for R quad set during R stance phase required initially   Stairs            Wheelchair Mobility    Modified Rankin (Stroke Patients Only)       Balance Overall balance assessment: Needs assistance;History of Falls Sitting-balance support: No upper extremity supported;Feet supported Sitting balance-Leahy Scale: Good Sitting balance - Comments: steady sitting reaching within BOS   Standing balance support: Bilateral upper extremity supported;Single extremity supported Standing balance-Leahy Scale: Fair Standing balance comment: requires at least single UE support for static standing balance                            Cognition Arousal/Alertness: Awake/alert Behavior During Therapy: WFL for tasks assessed/performed Overall Cognitive Status: Within Functional Limits for tasks assessed                                        Exercises Total Joint Exercises Long Arc Quad: AROM;Strengthening;Both;10 reps;Seated Knee Flexion: AROM;Strengthening;Right;10 reps;Seated Goniometric ROM: R knee extension 10 degrees short of neutral semi-supine in bed; R knee flexion 85 degrees in sitting    General Comments General comments (skin integrity, edema, etc.): Pt resting in recliner with hemovac and polar care in place.  Pt agreeable to PT session.  Pt's wife present during session.      Pertinent Vitals/Pain Pain Assessment: 0-10 Pain Score: 6  Pain Location: R knee Pain Descriptors / Indicators: Tender;Sore Pain Intervention(s): Limited activity  within patient's tolerance;Monitored during session;Premedicated before session;Repositioned;Ice applied    Home Living Family/patient expects to be discharged to:: Private residence Living Arrangements: Spouse/significant other Available Help at Discharge: Family Type of Home: House Home Access: Stairs to enter Entrance Stairs-Rails: Right;Left Home  Layout: Two level;Able to live on main level with bedroom/bathroom Home Equipment: Shower seat - built in;Shower seat;Adaptive equipment      Prior Function Level of Independence: Independent      Comments: Pt reports 2 falls in past 3 weeks d/t R knee issues (fell hitting R hand and L elbow area). No falls prior to that. 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   PT Goals (current goals can now be found in the care plan section) Acute Rehab PT Goals Patient Stated Goal: to go home PT Goal Formulation: With patient/family Time For Goal Achievement: 12/08/17 Potential to Achieve Goals: Good Progress towards PT goals: Progressing toward goals    Frequency    BID      PT Plan Current plan remains appropriate    Co-evaluation              AM-PAC PT "6 Clicks" Daily Activity  Outcome Measure  Difficulty turning over in bed (including adjusting bedclothes, sheets and blankets)?: A Little Difficulty moving from lying on back to sitting on the side of the bed? : A Lot Difficulty sitting down on and standing up from a chair with arms (e.g., wheelchair, bedside commode, etc,.)?: A Little Help needed moving to and from a bed to chair (including a wheelchair)?: A Little Help needed walking in hospital room?: A Little Help needed climbing 3-5 steps with a railing? : A Little 6 Click Score: 14    End of Session Equipment Utilized During Treatment: Gait belt Activity Tolerance: Patient tolerated treatment well Patient left: in chair;with call bell/phone within reach;with chair alarm set;with family/visitor present;with SCD's reapplied(B heels elevated via towel rolls; polar care in place and activated) Nurse Communication: Mobility status;Precautions;Weight bearing status PT Visit Diagnosis: Other abnormalities of gait and mobility (R26.89);Muscle weakness (generalized) (M62.81);History of falling (Z91.81);Pain Pain - Right/Left: Right Pain - part of body: Knee      Time: 2423-5361 PT Time Calculation (min) (ACUTE ONLY): 33 min  Charges:  $Gait Training: 8-22 mins $Therapeutic Exercise: 8-22 mins                    G Codes:  Functional Assessment Tool Used: AM-PAC 6 Clicks Basic Mobility Functional Limitation: Mobility: Walking and moving around Mobility: Walking and Moving Around Current Status (W4315): At least 40 percent but less than 60 percent impaired, limited or restricted Mobility: Walking and Moving Around Goal Status [phone]): 0 percent impaired, limited or restricted    Leitha Bleak, PT 11/24/17, 3:36 PM [phone]

## 2017-11-24 NOTE — Discharge Summary (Signed)
Physician Discharge Summary  Patient ID: [name] [name] MRN: [id number] DOB/AGE: [birth year] 66 y.o.  Admit date: 11/23/2017 Discharge date: 11/25/2017  Admission Diagnoses:  primary osteoarthritis of right knee   Discharge Diagnoses: Patient Active Problem List   Diagnosis Date Noted  . S/P total knee arthroplasty 11/23/2017  . Narcotic dependence ([place]) 09/14/2017  . Gastroenteritis 09/14/2017  . Encounter for chronic pain management 04/26/2017  . Advance directive discussed with patient 03/16/2017  . S/P CABG (coronary artery bypass graft) 12/17/2014  . Controlled diabetes mellitus type 2 with complications ([place]) 28/41/3244  . Shortness of breath 08/29/2012  . Atherosclerotic heart disease of native coronary artery with angina pectoris ([place]) 11/11/2011  . Routine general medical examination at a health care facility 05/05/2011  . ACTINIC KERATOSIS 04/28/2010  . COLONIC POLYPS, HX OF 04/28/2010  . BENIGN PROSTATIC HYPERTROPHY 11/28/2007  . Osteoarthritis of knee 10/26/2007  . Hyperlipidemia 04/17/2007  . CARPAL TUNNEL SYNDROME 04/17/2007  . Essential hypertension 04/17/2007  . OSA (obstructive sleep apnea) 04/17/2007    Past Medical History:  Diagnosis Date  . Angina   . Arthritis   . BPH (benign prostatic hypertrophy)   . Carpal tunnel syndrome   . Coronary artery disease 11/12/11   s/p CABG x 3  . Diabetes mellitus    type 2  . GERD (gastroesophageal reflux disease)   . Hx of colonic polyps   . Hyperlipidemia   . Hypertension   . Kidney stones   . OSA (obstructive sleep apnea)    uses cpap  . Shortness of breath      Transfusion: No transfusions during this admission   Consultants (if any):   Discharged Condition: Improved  Hospital Course: [name] [name] is an 66 y.o. male who was admitted 11/23/2017 with a diagnosis of degenerative arthrosis right knee and went to the operating room on 11/23/2017 and underwent the above named procedures.    Surgeries:Procedure(s): COMPUTER ASSISTED TOTAL KNEE ARTHROPLASTY on 11/23/2017  PRE-OPERATIVE DIAGNOSIS: Degenerative arthrosis of the right knee, primary  POST-OPERATIVE DIAGNOSIS:  Same  PROCEDURE:  Right total knee arthroplasty using computer-assisted navigation  SURGEON:  Marciano Sequin. M.D.  ASSISTANT:  Vance Peper, PA (present and scrubbed throughout the case, critical for assistance with exposure, retraction, instrumentation, and closure)  ANESTHESIA: spinal  ESTIMATED BLOOD LOSS: 50 mL  FLUIDS REPLACED: 1400 mL of crystalloid  TOURNIQUET TIME: 110 minutes  DRAINS: 2 medium Hemovac drains  SOFT TISSUE RELEASES: Anterior cruciate ligament, posterior cruciate ligament, deep and superficial medial collateral ligament, patellofemoral ligament  IMPLANTS UTILIZED: DePuy Attune size 6 posterior stabilized femoral component (cemented), size 6 rotating platform tibial component (cemented), 38 mm medialized dome patella (cemented), and a 5 mm stabilized rotating platform polyethylene insert.  INDICATIONS FOR SURGERY: [name] [name] is a 66 y.o. year old male with a long history of progressive knee pain. X-rays demonstrated severe degenerative changes in tricompartmental fashion. The patient had not seen any significant improvement despite conservative nonsurgical intervention. After discussion of the risks and benefits of surgical intervention, the patient expressed understanding of the risks benefits and agree with plans for total knee arthroplasty.   The risks, benefits, and alternatives were discussed at length including but not limited to the risks of infection, bleeding, nerve injury, stiffness, blood clots, the need for revision surgery, cardiopulmonary complications, among others, and they were willing to proceed.   Patient tolerated the surgery well. No complications .Patient was taken to PACU where she was stabilized and then  transferred to the  orthopedic floor.  Patient started on Lovenox 30 mg q 12 hrs. Foot pumps applied bilaterally at 80 mm hgb. Heels elevated off bed with rolled towels. No evidence of DVT. Calves non tender. Negative [place]. Physical therapy started on day #1 for gait training and transfer with OT starting on  day #1 for ADL and assisted devices. Patient has done well with therapy. Ambulated greater than 200 feet upon being discharged. Was able to ascend and descend 4 steps safely and independently  Patient's IV And Foley were discontinued on day #1 with Hemovac being discontinued on day #2. Dressing was changed on day 2 prior to patient being discharged   He was given perioperative antibiotics:  Anti-infectives (From admission, onward)   Start     Dose/Rate Route Frequency Ordered Stop   11/23/17 1800  ceFAZolin (ANCEF) 2 g in dextrose 5 % 100 mL IVPB     2 g 200 mL/hr over 30 Minutes Intravenous Every 6 hours 11/23/17 1637 11/24/17 1759   11/23/17 1645  ceFAZolin (ANCEF) IVPB 2g/100 mL premix  Status:  Discontinued     2 g 200 mL/hr over 30 Minutes Intravenous Every 6 hours 11/23/17 1634 11/23/17 1636   11/23/17 1012  ceFAZolin (ANCEF) 2-4 GM/100ML-% IVPB    Comments:  [name], [name]   : cabinet override      11/23/17 1012 11/23/17 2229   11/23/17 0600  ceFAZolin (ANCEF) IVPB 2g/100 mL premix  Status:  Discontinued     2 g 200 mL/hr over 30 Minutes Intravenous On call to O.R. 11/22/17 2205 11/23/17 0935    .  He was fitted with AV 1 compression foot pump devices, instructed on heel pumps, early ambulation, and fitted with TED stockings bilaterally for DVT prophylaxis.  He benefited maximally from the hospital stay and there were no complications.    Recent vital signs:  Vitals:   11/24/17 0018 11/24/17 0350  BP: 132/62 (!) 103/54  Pulse: 68 (!) 58  Resp: 18 19  Temp: 98.3 F (36.8 C) 98.2 F (36.8 C)  SpO2: 96% 97%    Recent laboratory studies:  Lab Results  Component Value Date   HGB  15.9 11/10/2017   HGB 15.5 03/16/2017   HGB 14.1 03/08/2016   Lab Results  Component Value Date   WBC 6.1 11/10/2017   PLT 229 11/10/2017   Lab Results  Component Value Date   INR 0.97 11/10/2017   Lab Results  Component Value Date   NA 138 11/10/2017   K 3.5 11/10/2017   CL 102 11/10/2017   CO2 25 11/10/2017   BUN 16 11/10/2017   CREATININE 0.92 11/10/2017   GLUCOSE 145 (H) 11/10/2017    Discharge Medications:   Allergies as of 11/25/2017      Reactions   Crestor [rosuvastatin] Other (See Comments)   Muscle aches.   Doxazosin Mesylate Other (See Comments)   REACTION: didn't work and may have caused SOB   Doxycycline Other (See Comments)   REACTION: unspecified   Glimepiride Other (See Comments)   REACTION: red eyes   Glipizide Other (See Comments)   REACTION: myalgia??   Losartan Potassium-hctz Other (See Comments)   REACTION: chest \\T \ leg pain   Metoprolol Tartrate Other (See Comments)   Mood swings, memory changes   Pravastatin Other (See Comments)   Muscle aches   Rosiglitazone Maleate Other (See Comments)   REACTION: myalgia   Simvastatin Other (See Comments)   REACTION: myalgias  Tetracyclines & Related    Carvedilol Other (See Comments)   Felt bad      Medication List    STOP taking these medications   aspirin 81 MG tablet   HYDROcodone-acetaminophen 5-325 MG tablet Commonly known as:  NORCO/VICODIN     TAKE these medications   enoxaparin 40 MG/0.4ML injection Commonly known as:  LOVENOX Inject 0.4 mLs (40 mg total) into the skin daily for 14 days. Start taking on:  11/26/2017   furosemide 40 MG tablet Commonly known as:  LASIX TAKE 1 TABLET TWICE A DAY AS NEEDED   glucose blood test strip Commonly known as:  FREESTYLE LITE Use as instructed, patient tests once daily. Dx: 250.00   INVOKANA 300 MG Tabs tablet Generic drug:  canagliflozin TAKE 1 TABLET DAILY BEFORE BREAKFAST   lisinopril 10 MG tablet Commonly known as:   PRINIVIL,ZESTRIL TAKE 1 TABLET DAILY   metFORMIN 1000 MG tablet Commonly known as:  GLUCOPHAGE Take 1,000 mg by mouth 2 (two) times daily with a meal.   nitroGLYCERIN 0.4 MG SL tablet Commonly known as:  NITROSTAT Place 1 tablet (0.4 mg total) under the tongue every 5 (five) minutes as needed for chest pain.   oxyCODONE 5 MG immediate release tablet Commonly known as:  Oxy IR/ROXICODONE Take 1 tablet (5 mg total) by mouth every 3 (three) hours as needed for moderate pain ((score 4 to 6)).   pioglitazone 30 MG tablet Commonly known as:  ACTOS TAKE 1 TABLET DAILY   potassium chloride SA 20 MEQ tablet Commonly known as:  K-DUR,KLOR-CON Take 20 mEq by mouth 2 (two) times daily as needed (Take with lasix).   traMADol 50 MG tablet Commonly known as:  ULTRAM Take 1-2 tablets (50-100 mg total) by mouth every 4 (four) hours as needed for moderate pain.   triamcinolone cream 0.1 % Commonly known as:  KENALOG Apply 1 application topically 2 (two) times daily as needed.            Durable Medical Equipment  (From admission, onward)        Start     Ordered   11/23/17 1634  DME Walker rolling  Once    Question:  Patient needs a walker to treat with the following condition  Answer:  Total knee replacement status   11/23/17 1634   11/23/17 1634  DME Bedside commode  Once    Question:  Patient needs a bedside commode to treat with the following condition  Answer:  Total knee replacement status   11/23/17 1634      Diagnostic Studies: Dg Knee Right Port  Result Date: 11/23/2017 CLINICAL DATA:  Status post right total knee joint prosthesis placement. EXAM: PORTABLE RIGHT KNEE - 1-2 VIEW COMPARISON:  No recent studies in [facility] FINDINGS: The patient has undergone right total knee joint prosthesis placement. Radiographic positioning of the prosthetic components is good. The interface with the native bone is normal. There are 2 drainage catheters present. There is skin staples present.  IMPRESSION: No immediate complication following right total knee joint prosthesis placement. Electronically Signed   By: David  Martinique M.D.   On: 11/23/2017 15:32    Disposition: 01-Home or Self Care  Discharge Instructions    Diet - low sodium heart healthy   Complete by:  As directed    Increase activity slowly   Complete by:  As directed       Follow-up Information    Watt Climes, PA On 12/08/2017.  Specialty:  Physician Assistant Why:  at 10:15am Contact information: [place] [address] [phone]        Dereck Leep, MD On 01/05/2018.   Specialty:  Orthopedic Surgery Why:  at 9:45am Contact information: [address] [place] [address] [phone]            Signed: Watt Climes 11/24/2017, 7:18 AM

## 2017-11-24 NOTE — Progress Notes (Signed)
Clinical Social Worker (CSW) received SNF consult. PT is recommending home health. RN case manager aware of above. Please reconsult if future social work needs arise. CSW signing off.   Lenon Kuennen, LCSW [phone] 

## 2017-11-24 NOTE — Evaluation (Signed)
Occupational Therapy Evaluation Patient Details Name: [name] [name] MRN: [id number] DOB: 1951-06-30 Today's Date: 11/24/2017    History of Present Illness Pt is a 66 y.o. male s/p R TKA secondary to primary degenerative arthrosis 11/23/17.  PMH includes sleep apnea, CPAP, DM, htn, angina, CABG, obesity.   Clinical Impression   Pt seen for OT evaluation this date. Pt was active and independent prior to admission, however endorses a couple recent falls due to R knee. Pt working full time and manages a cattle farm. Pt presents with R knee pain, expected strength/ROM deficits in RLE post-op, and increased need for assist for functional mobility and ADL tasks. Pt/spouse educated in compression stocking mgt for donning/doffing, polar care mgt/wear schedule, AE/DME for dressing and toileting needs, and falls prevention strategies. Pt/spouse verbalized all education/training provided. Spouse able to provide needed level of assist and pt verbalizes no additional concerns. Thankful for education/training provided. No OT follow up indicated. Will sign off.     Follow Up Recommendations  No OT follow up    Equipment Recommendations  3 in 1 bedside commode    Recommendations for Other Services       Precautions / Restrictions Precautions Precautions: Fall;Knee Required Braces or Orthoses: Knee Immobilizer - Right Knee Immobilizer - Right: Discontinue once straight leg raise with < 10 degree lag Restrictions Weight Bearing Restrictions: Yes RLE Weight Bearing: Weight bearing as tolerated      Mobility Bed Mobility     General bed mobility comments: deferred, pt in recliner  Transfers Overall transfer level: Needs assistance Equipment used: Rolling walker (2 wheeled) Transfers: Sit to/from Stand Sit to Stand: Min guard Stand pivot transfers: Min guard       General transfer comment: CGA for safety with transfer from bed and recliner; initial vc's for hand and LE positioning but  then no further cues required    Balance Overall balance assessment: Needs assistance;History of Falls Sitting-balance support: No upper extremity supported;Feet supported Sitting balance-Leahy Scale: Good Sitting balance - Comments: steady sitting reaching within BOS   Standing balance support: Bilateral upper extremity supported Standing balance-Leahy Scale: Fair Standing balance comment: requires at least single UE support for static standing balance                           ADL either performed or assessed with clinical judgement   ADL Overall ADL's : Needs assistance/impaired         Upper Body Bathing: Sitting;Modified independent   Lower Body Bathing: Sit to/from stand;Minimal assistance   Upper Body Dressing : Sitting;Modified independent   Lower Body Dressing: Sit to/from stand;Minimal assistance Lower Body Dressing Details (indicate cue type and reason): pt/spouse educated in AE for LB dressing with verbal instruction and visual demo Toilet Transfer: Min guard;RW;Comfort height toilet Toilet Transfer Details (indicate cue type and reason): pt/spouse educated in [facility] frame over toilet to maximize safety and independence with toilet transfers         Functional mobility during ADLs: Min guard;Rolling walker General ADL Comments: pt/spouse educated in compression stocking mgt and techniques for donning through verbal instruction and visual demonstration     Vision Baseline Vision/History: Wears glasses Wears Glasses: Reading only Patient Visual Report: No change from baseline       Perception     Praxis      Pertinent Vitals/Pain Pain Assessment: 0-10 Pain Score: 7  Pain Location: R knee Pain Descriptors / Indicators: Tender;Sore Pain Intervention(s):  Limited activity within patient's tolerance;Monitored during session;RN gave pain meds during session;Ice applied     Hand Dominance     Extremity/Trunk Assessment Upper Extremity  Assessment Upper Extremity Assessment: Overall WFL for tasks assessed   Lower Extremity Assessment Lower Extremity Assessment: Defer to PT evaluation;RLE deficits/detail RLE Deficits / Details: able to perform R LE SLR independently x10 times; good DF/PF LLE Deficits / Details: strength and ROM WFL L LE       Communication Communication Communication: No difficulties   Cognition Arousal/Alertness: Awake/alert Behavior During Therapy: WFL for tasks assessed/performed Overall Cognitive Status: Within Functional Limits for tasks assessed                                     General Comments  Pt resting in bed with hemovac and polar care in place.    Exercises Other Exercises Other Exercises: pt/spouse educated in polar care mgt   Shoulder Instructions      Home Living Family/patient expects to be discharged to:: Private residence Living Arrangements: Spouse/significant other Available Help at Discharge: Family Type of Home: House Home Access: Stairs to enter Technical brewer of Steps: 6 Entrance Stairs-Rails: Right;Left Home Layout: Two level;Able to live on main level with bedroom/bathroom     Bathroom Shower/Tub: Occupational psychologist: Standard(with riser (no rails))     Home Equipment: Shower seat - built in;Shower seat;Adaptive equipment Adaptive Equipment: Reacher;Sock aid;Long-handled shoe horn        Prior Functioning/Environment Level of Independence: Independent        Comments: Pt reports 2 falls in past 3 weeks d/t R knee issues (fell hitting R hand and L elbow area). No falls prior to that. 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        OT Problem List:        OT Treatment/Interventions:      OT Goals(Current goals can be found in the care plan section) Acute Rehab OT Goals Patient Stated Goal: to go home OT Goal Formulation: All assessment and education complete, DC therapy  OT Frequency:      Barriers to D/C:            Co-evaluation              AM-PAC PT "6 Clicks" Daily Activity     Outcome Measure Help from another person eating meals?: None Help from another person taking care of personal grooming?: None Help from another person toileting, which includes using toliet, bedpan, or urinal?: A Little Help from another person bathing (including washing, rinsing, drying)?: A Little Help from another person to put on and taking off regular upper body clothing?: None Help from another person to put on and taking off regular lower body clothing?: A Little 6 Click Score: 21   End of Session    Activity Tolerance: Patient tolerated treatment well Patient left: in chair;with call bell/phone within reach;with chair alarm set;with family/visitor present;with nursing/sitter in room;with SCD's reapplied;Other (comment)(polar care in place)  OT Visit Diagnosis: Other abnormalities of gait and mobility (R26.89);History of falling (Z91.81)                Time: 1343-1410 OT Time Calculation (min): 27 min Charges:  OT General Charges $OT Visit: 1 Visit OT Evaluation $OT Eval Low Complexity: 1 Low OT Treatments $Self Care/Home Management : 8-22 mins G-Codes: OT G-codes **NOT FOR  INPATIENT CLASS** Functional Assessment Tool Used: AM-PAC 6 Clicks Daily Activity;Clinical judgement Functional Limitation: Self care Self Care Current Status (B1660): At least 1 percent but less than 20 percent impaired, limited or restricted Self Care Goal Status (A0045): At least 1 percent but less than 20 percent impaired, limited or restricted Self Care Discharge Status [phone]): At least 1 percent but less than 20 percent impaired, limited or restricted   Jeni Salles, MPH, MS, OTR/L ascom [phone] 11/24/17, 2:38 PM

## 2017-11-24 NOTE — Care Management Note (Signed)
Case Management Note  Patient Details  Name: [name] [name] MRN: [id number] Date of Birth: 08-Jan-1951  Subjective/Objective:  POD # 1 right TKA. Met with patient and his wife at his bedside to discuss discharge planning. It is anticipated that patient will discharge home tomorrow with the care of his wife. Offered home health agencies. Referral to [facility] for HHPT. Patient will need a walker and bsc. Ordered from [facility] Pharmacy: [facility]: [phone]. Will check price of Lovenox prior to discharge.                    Action/Plan: [facility] for HHPT. [facility] for walker and bsc.   Expected Discharge Date:                  Expected Discharge Plan:  [name]  In-House Referral:     Discharge planning Services  CM Consult  Post Acute Care Choice:  Durable Medical Equipment Choice offered to:  Patient  DME Arranged:  Bedside commode, Walker rolling DME Agency:  [place]:  PT [place]:  [facility] ([facility])  Status of Service:  In process, will continue to follow  If discussed at Long Length of Stay Meetings, dates discussed:    Additional Comments:  [name], RN 11/24/2017, 2:54 PM

## 2017-11-24 NOTE — Anesthesia Postprocedure Evaluation (Signed)
Anesthesia Post Note  Patient: [name] [name]  Procedure(s) Performed: COMPUTER ASSISTED TOTAL KNEE ARTHROPLASTY (Right )  Patient location during evaluation: Nursing Unit Anesthesia Type: Spinal Level of consciousness: awake, awake and alert and oriented Pain management: pain level controlled Vital Signs Assessment: post-procedure vital signs reviewed and stable Respiratory status: spontaneous breathing Cardiovascular status: blood pressure returned to baseline Postop Assessment: no headache, no backache, no apparent nausea or vomiting and adequate PO intake Anesthetic complications: no     Last Vitals:  Vitals:   11/24/17 0018 11/24/17 0350  BP: 132/62 (!) 103/54  Pulse: 68 (!) 58  Resp: 18 19  Temp: 36.8 C 36.8 C  SpO2: 96% 97%    Last Pain:  Vitals:   11/24/17 0614  TempSrc:   PainSc: 7                  Josselyne Onofrio Lorenza Chick

## 2017-11-25 LAB — GLUCOSE, CAPILLARY
GLUCOSE-CAPILLARY: 137 mg/dL — AB (ref 65–99)
Glucose-Capillary: 179 mg/dL — ABNORMAL HIGH (ref 65–99)

## 2017-11-25 LAB — BASIC METABOLIC PANEL
Anion gap: 5 (ref 5–15)
BUN: 13 mg/dL (ref 6–20)
CALCIUM: 8.4 mg/dL — AB (ref 8.9–10.3)
CO2: 25 mmol/L (ref 22–32)
CREATININE: 0.81 mg/dL (ref 0.61–1.24)
Chloride: 106 mmol/L (ref 101–111)
GFR calc Af Amer: >60 mL/min (ref >60)
Glucose, Bld: 157 mg/dL — ABNORMAL HIGH (ref 65–99)
POTASSIUM: 3.6 mmol/L (ref 3.5–5.1)
SODIUM: 136 mmol/L (ref 135–145)

## 2017-11-25 NOTE — Progress Notes (Signed)
Pt. Refused to wear bone foam. Only had it on a total of one hour all night.

## 2017-11-25 NOTE — Progress Notes (Signed)
Physical Therapy Treatment Patient Details Name: [name] [name] MRN: [id number] DOB: 09-07-1951 Today's Date: 11/25/2017    History of Present Illness Pt is a 66 y.o. male s/p R TKA secondary to primary degenerative arthrosis 11/23/17.  PMH includes sleep apnea, CPAP, DM, htn, angina, CABG, obesity.    PT Comments    Pt able to progress to ambulating with RW modified independent around nursing loop and navigating 8 stairs with railing safely.  Pt's R knee flexion ROM improving to 92 degrees today.  Pain 6/10 R knee beginning of session and 8/10 end of session (nursing notified immediately of pt's request for pain meds; polar care applied).  Plan to discharge home today.  Pt appears safe to discharge home when medically appropriate.  Pt would benefit from continued strengthening, R knee ROM, and progressive functional mobility.    Follow Up Recommendations  Home health PT     Equipment Recommendations  Rolling walker with 5" wheels    Recommendations for Other Services       Precautions / Restrictions Precautions Precautions: Fall;Knee Precaution Booklet Issued: Yes (comment) Required Braces or Orthoses: Knee Immobilizer - Right Knee Immobilizer - Right: Discontinue once straight leg raise with < 10 degree lag Restrictions Weight Bearing Restrictions: Yes RLE Weight Bearing: Weight bearing as tolerated    Mobility  Bed Mobility Overal bed mobility: Modified Independent Bed Mobility: Supine to Sit;Sit to Supine     Supine to sit: Modified independent (Device/Increase time) Sit to supine: Modified independent (Device/Increase time)   General bed mobility comments: mild increased effort to perform but no assist required  Transfers Overall transfer level: Modified independent Equipment used: Rolling walker (2 wheeled) Transfers: Sit to/from [facility] Sit to Stand: Modified independent (Device/Increase time) Stand pivot transfers: Modified  independent (Device/Increase time)       General transfer comment: steady strong transfers; no vc's required  Ambulation/Gait Ambulation/Gait assistance: Modified independent (Device/Increase time) Ambulation Distance (Feet): (100 feet; 180 feet) Assistive device: Rolling walker (2 wheeled)   Gait velocity: decreased   General Gait Details: increased UE support through RW to offweight R LE; initial cues for R quad set during R stance phase but none required after that; steady; step to gait pattern   Stairs Stairs: Yes   Stair Management: One rail Right;Step to pattern;Sideways Number of Stairs: 8 General stair comments: initial vc's and demo and then pt able to perform on own without any further cueing  Wheelchair Mobility    Modified Rankin (Stroke Patients Only)       Balance Overall balance assessment: Needs assistance;History of Falls Sitting-balance support: No upper extremity supported;Feet supported Sitting balance-Leahy Scale: Normal Sitting balance - Comments: steady sitting reaching outside BOS     Standing balance-Leahy Scale: Fair Standing balance comment: steady standing static no UE support                            Cognition Arousal/Alertness: Awake/alert Behavior During Therapy: WFL for tasks assessed/performed Overall Cognitive Status: Within Functional Limits for tasks assessed                                        Exercises Total Joint Exercises Short Arc Quad: AROM;Strengthening;Right;10 reps;Supine Heel Slides: AAROM;Strengthening;Right;10 reps;Supine Hip ABduction/ADduction: AROM;Strengthening;Right;10 reps;Supine Straight Leg Raises: AROM;Strengthening;Right;10 reps;Supine Goniometric ROM: R knee extension 8 degrees short of neutral  semi-supine in bed; R knee flexion 92 degrees in sitting    General Comments General comments (skin integrity, edema, etc.): Pt resting in bed upon PT arrival; polar care in  place.  Nursing cleared pt for participation in physical therapy.  Pt agreeable to PT session.      Pertinent Vitals/Pain Pain Assessment: 0-10 Pain Score: 8 (6/10 at rest beginning of session; 8/10 end of session) Pain Location: R knee Pain Descriptors / Indicators: Tender;Sore Pain Intervention(s): Limited activity within patient's tolerance;Monitored during session;Premedicated before session;Repositioned;Patient requesting pain meds-RN notified;Ice applied  Vitals (HR and O2 on room air) stable and WFL throughout treatment session.    Home Living                      Prior Function            PT Goals (current goals can now be found in the care plan section) Acute Rehab PT Goals Patient Stated Goal: to go home PT Goal Formulation: With patient/family Time For Goal Achievement: 12/08/17 Potential to Achieve Goals: Good Additional Goals Additional Goal #1: Pt's R knee ROM 0-90 degrees. Progress towards PT goals: Progressing toward goals    Frequency    BID      PT Plan Current plan remains appropriate    Co-evaluation              AM-PAC PT "6 Clicks" Daily Activity  Outcome Measure  Difficulty turning over in bed (including adjusting bedclothes, sheets and blankets)?: None Difficulty moving from lying on back to sitting on the side of the bed? : A Little Difficulty sitting down on and standing up from a chair with arms (e.g., wheelchair, bedside commode, etc,.)?: None Help needed moving to and from a bed to chair (including a wheelchair)?: None Help needed walking in hospital room?: None Help needed climbing 3-5 steps with a railing? : A Little 6 Click Score: 22    End of Session Equipment Utilized During Treatment: Gait belt Activity Tolerance: Patient tolerated treatment well Patient left: in chair;with call bell/phone within reach;with chair alarm set;with family/visitor present;with SCD's reapplied(B heels elevated via towel rolls; polar care  in place and activated) Nurse Communication: Mobility status;Precautions;Weight bearing status PT Visit Diagnosis: Other abnormalities of gait and mobility (R26.89);Muscle weakness (generalized) (M62.81);History of falling (Z91.81);Pain Pain - Right/Left: Right Pain - part of body: Knee     Time: 6440-3474 PT Time Calculation (min) (ACUTE ONLY): 55 min  Charges:  $Gait Training: 23-37 mins $Therapeutic Exercise: 8-22 mins $Therapeutic Activity: 8-22 mins                    G CodesLeitha Bleak, PT 11/25/17, 11:05 AM [phone]

## 2017-11-25 NOTE — Progress Notes (Signed)
   Subjective: 2 Days Post-Op Procedure(s) (LRB): COMPUTER ASSISTED TOTAL KNEE ARTHROPLASTY (Right) Patient reports pain as severe.  "Killed me most of the night" Patient is well, and has had no acute complaints or problems She did well with physical therapy yesterday.  Still however needs make the complete lap around the nurses desk and do steps prior to being discharged Plan is to go Home after hospital stay. no nausea and no vomiting Patient denies any chest pains or shortness of breath. Objective: Vital signs in last 24 hours: Temp:  [97.5 F (36.4 C)-99.1 F (37.3 C)] 99.1 F (37.3 C) (12/21 0427) Pulse Rate:  [51-71] 71 (12/21 0427) Resp:  [19-20] 19 (12/21 0427) BP: (125-153)/(59-68) 153/63 (12/21 0427) SpO2:  [96 %-99 %] 96 % (12/21 0427) well approximated incision Heels are non tender and elevated off the bed using rolled towels Intake/Output from previous day: 12/20 0701 - 12/21 0700 In: 2708.3 [P.O.:240; I.V.:2168.3; IV Piggyback:300] Out: 130 [Drains:130] Intake/Output this shift: Total I/O In: -  Out: 130 [Drains:130]  No results for input(s): HGB in the last 72 hours. No results for input(s): WBC, RBC, HCT, PLT in the last 72 hours. Recent Labs    11/24/17 0705 11/25/17 0410  NA 135 136  K 3.5 3.6  CL 106 106  CO2 24 25  BUN 13 13  CREATININE 0.94 0.81  GLUCOSE 144* 157*  CALCIUM 8.3* 8.4*   No results for input(s): LABPT, INR in the last 72 hours.  EXAM General - Patient is Alert, Appropriate and Oriented Extremity - Neurologically intact Neurovascular intact Sensation intact distally Intact pulses distally Dorsiflexion/Plantar flexion intact No cellulitis present Compartment soft Dressing - dressing C/D/I Motor Function - intact, moving foot and toes well on exam.    Past Medical History:  Diagnosis Date  . Angina   . Arthritis   . BPH (benign prostatic hypertrophy)   . Carpal tunnel syndrome   . Coronary artery disease 11/12/11   s/p  CABG x 3  . Diabetes mellitus    type 2  . GERD (gastroesophageal reflux disease)   . Hx of colonic polyps   . Hyperlipidemia   . Hypertension   . Kidney stones   . OSA (obstructive sleep apnea)    uses cpap  . Shortness of breath     Assessment/Plan: 2 Days Post-Op Procedure(s) (LRB): COMPUTER ASSISTED TOTAL KNEE ARTHROPLASTY (Right) Active Problems:   S/P total knee arthroplasty  Estimated body mass index is 34.28 kg/m as calculated from the following:   Height as of this encounter: 5\' 5"  (1.651 m).   Weight as of this encounter: 93.4 kg (206 lb). Up with therapy Discharge home with home health  Labs: Were reviewed and within normal range DVT Prophylaxis - Lovenox, Foot Pumps and TED hose Weight-Bearing as tolerated to right leg Hemovac was discontinued on today's visit.  Tips of the Hemovac appear to be intact. Please wash the operative leg and apply TED stockings. Please change dressing prior to patient being discharged and get the patient to x-ray honeycomb dressings to take home  [place]. [place] 11/25/2017, 6:31 AM

## 2017-11-25 NOTE — Care Management Note (Signed)
Case Management Note  Patient Details  Name: [name] [name] MRN: [id number] Date of Birth: [birth year]  Subjective/Objective:  Discharging today                  Action/Plan: [facility] notified of discharge. Lovenox will be $75.44. Patient denies issues paying for prescription. DME to be delivered this am.   Expected Discharge Date:  11/25/17               Expected Discharge Plan:  [place]  In-House Referral:     Discharge planning Services  CM Consult  Post Acute Care Choice:  Durable Medical Equipment Choice offered to:  Patient  DME Arranged:  Bedside commode, Walker rolling DME Agency:  [place]:  PT [facility] Agency:  [facility])  Status of Service:  Completed, signed off  If discussed at [employer], dates discussed:    Additional Comments:  [name], RN 11/25/2017, 8:46 AM

## 2017-11-25 NOTE — Progress Notes (Addendum)
Patient being discharged home with [facility].  Wife is here to take patient home. DC & RX instructions given and patient acknowledged understanding. Patient had no questions. Wife given instruction on Lovenox injections and administered with this nurse present. Patient acknowledged that he would be able to give his own injections if necessary. Leg cleansed and new honeycomb dressing applied. Ted hose to operative leg. IV removed. VSS at time of discharge.

## 2017-11-27 DIAGNOSIS — N400 Enlarged prostate without lower urinary tract symptoms: Secondary | ICD-10-CM | POA: Diagnosis not present

## 2017-11-27 DIAGNOSIS — I10 Essential (primary) hypertension: Secondary | ICD-10-CM | POA: Diagnosis not present

## 2017-11-27 DIAGNOSIS — E119 Type 2 diabetes mellitus without complications: Secondary | ICD-10-CM | POA: Diagnosis not present

## 2017-11-27 DIAGNOSIS — Z471 Aftercare following joint replacement surgery: Secondary | ICD-10-CM | POA: Diagnosis not present

## 2017-11-27 DIAGNOSIS — I2510 Atherosclerotic heart disease of native coronary artery without angina pectoris: Secondary | ICD-10-CM | POA: Diagnosis not present

## 2017-11-27 DIAGNOSIS — G4733 Obstructive sleep apnea (adult) (pediatric): Secondary | ICD-10-CM | POA: Diagnosis not present

## 2017-11-30 DIAGNOSIS — I2510 Atherosclerotic heart disease of native coronary artery without angina pectoris: Secondary | ICD-10-CM | POA: Diagnosis not present

## 2017-11-30 DIAGNOSIS — E119 Type 2 diabetes mellitus without complications: Secondary | ICD-10-CM | POA: Diagnosis not present

## 2017-11-30 DIAGNOSIS — Z471 Aftercare following joint replacement surgery: Secondary | ICD-10-CM | POA: Diagnosis not present

## 2017-11-30 DIAGNOSIS — G4733 Obstructive sleep apnea (adult) (pediatric): Secondary | ICD-10-CM | POA: Diagnosis not present

## 2017-11-30 DIAGNOSIS — I10 Essential (primary) hypertension: Secondary | ICD-10-CM | POA: Diagnosis not present

## 2017-11-30 DIAGNOSIS — N400 Enlarged prostate without lower urinary tract symptoms: Secondary | ICD-10-CM | POA: Diagnosis not present

## 2017-12-01 ENCOUNTER — Other Ambulatory Visit: Payer: Self-pay | Admitting: Cardiovascular Disease

## 2017-12-01 DIAGNOSIS — G4733 Obstructive sleep apnea (adult) (pediatric): Secondary | ICD-10-CM | POA: Diagnosis not present

## 2017-12-01 DIAGNOSIS — Z471 Aftercare following joint replacement surgery: Secondary | ICD-10-CM | POA: Diagnosis not present

## 2017-12-01 DIAGNOSIS — N400 Enlarged prostate without lower urinary tract symptoms: Secondary | ICD-10-CM | POA: Diagnosis not present

## 2017-12-01 DIAGNOSIS — I2510 Atherosclerotic heart disease of native coronary artery without angina pectoris: Secondary | ICD-10-CM | POA: Diagnosis not present

## 2017-12-01 DIAGNOSIS — I10 Essential (primary) hypertension: Secondary | ICD-10-CM | POA: Diagnosis not present

## 2017-12-01 DIAGNOSIS — E119 Type 2 diabetes mellitus without complications: Secondary | ICD-10-CM | POA: Diagnosis not present

## 2017-12-05 DIAGNOSIS — I2510 Atherosclerotic heart disease of native coronary artery without angina pectoris: Secondary | ICD-10-CM | POA: Diagnosis not present

## 2017-12-05 DIAGNOSIS — Z471 Aftercare following joint replacement surgery: Secondary | ICD-10-CM | POA: Diagnosis not present

## 2017-12-05 DIAGNOSIS — E119 Type 2 diabetes mellitus without complications: Secondary | ICD-10-CM | POA: Diagnosis not present

## 2017-12-05 DIAGNOSIS — G4733 Obstructive sleep apnea (adult) (pediatric): Secondary | ICD-10-CM | POA: Diagnosis not present

## 2017-12-05 DIAGNOSIS — I10 Essential (primary) hypertension: Secondary | ICD-10-CM | POA: Diagnosis not present

## 2017-12-05 DIAGNOSIS — N400 Enlarged prostate without lower urinary tract symptoms: Secondary | ICD-10-CM | POA: Diagnosis not present

## 2017-12-07 DIAGNOSIS — E119 Type 2 diabetes mellitus without complications: Secondary | ICD-10-CM | POA: Diagnosis not present

## 2017-12-07 DIAGNOSIS — G4733 Obstructive sleep apnea (adult) (pediatric): Secondary | ICD-10-CM | POA: Diagnosis not present

## 2017-12-07 DIAGNOSIS — I2510 Atherosclerotic heart disease of native coronary artery without angina pectoris: Secondary | ICD-10-CM | POA: Diagnosis not present

## 2017-12-07 DIAGNOSIS — I10 Essential (primary) hypertension: Secondary | ICD-10-CM | POA: Diagnosis not present

## 2017-12-07 DIAGNOSIS — N400 Enlarged prostate without lower urinary tract symptoms: Secondary | ICD-10-CM | POA: Diagnosis not present

## 2017-12-07 DIAGNOSIS — Z471 Aftercare following joint replacement surgery: Secondary | ICD-10-CM | POA: Diagnosis not present

## 2017-12-08 DIAGNOSIS — Z96651 Presence of right artificial knee joint: Secondary | ICD-10-CM | POA: Diagnosis not present

## 2017-12-12 DIAGNOSIS — M25561 Pain in right knee: Secondary | ICD-10-CM | POA: Diagnosis not present

## 2017-12-14 DIAGNOSIS — Z96651 Presence of right artificial knee joint: Secondary | ICD-10-CM | POA: Diagnosis not present

## 2017-12-16 ENCOUNTER — Encounter: Payer: Self-pay | Admitting: Internal Medicine

## 2017-12-16 ENCOUNTER — Ambulatory Visit (INDEPENDENT_AMBULATORY_CARE_PROVIDER_SITE_OTHER): Payer: BLUE CROSS/BLUE SHIELD | Admitting: Internal Medicine

## 2017-12-16 VITALS — BP 124/80 | HR 82 | Temp 98.0°F | Wt 192.0 lb

## 2017-12-16 DIAGNOSIS — E118 Type 2 diabetes mellitus with unspecified complications: Secondary | ICD-10-CM | POA: Diagnosis not present

## 2017-12-16 DIAGNOSIS — G894 Chronic pain syndrome: Secondary | ICD-10-CM | POA: Diagnosis not present

## 2017-12-16 DIAGNOSIS — F1120 Opioid dependence, uncomplicated: Secondary | ICD-10-CM | POA: Diagnosis not present

## 2017-12-16 DIAGNOSIS — Z96651 Presence of right artificial knee joint: Secondary | ICD-10-CM | POA: Diagnosis not present

## 2017-12-16 NOTE — Assessment & Plan Note (Signed)
Back is less consistent but left knee bad and still recovering from surgery

## 2017-12-16 NOTE — Assessment & Plan Note (Signed)
Sugars better with weight loss No hypoglycemia Will reconsider the invokana if A1c down next time

## 2017-12-16 NOTE — Progress Notes (Signed)
Subjective:    Patient ID: [name] [name], male    DOB: 1951-07-22, 67 y.o.   MRN: [id number]  HPI Here for review of chronic pain and narcotic dependence Had right TKR by Dr Marry Guan 12/19 Doing fairly well Goes to [facility] for therapy  Still can't drive No cane or walker They asked him to stop the hydrocodone and change to tramadol (not realizing he still needed it for the other knee) May consider left TKR---but this one was bad so not in a rush  Back pain is intermittent Had spell and needed chiropractic  Sugars have been down some  Current Outpatient Medications on File Prior to Visit  Medication Sig Dispense Refill  . furosemide (LASIX) 40 MG tablet TAKE 1 TABLET TWICE A DAY AS NEEDED 90 tablet 0  . glucose blood (FREESTYLE LITE) test strip Use as instructed, patient tests once daily. Dx: 250.00 300 each 3  . HYDROcodone-acetaminophen (NORCO/VICODIN) 5-325 MG tablet Take 1 tablet by mouth 2 (two) times daily.    . INVOKANA 300 MG TABS tablet TAKE 1 TABLET DAILY BEFORE BREAKFAST 90 tablet 3  . lisinopril (PRINIVIL,ZESTRIL) 10 MG tablet TAKE 1 TABLET DAILY 90 tablet 1  . metFORMIN (GLUCOPHAGE) 1000 MG tablet Take 1,000 mg by mouth 2 (two) times daily with a meal.    . nitroGLYCERIN (NITROSTAT) 0.4 MG SL tablet Place 1 tablet (0.4 mg total) under the tongue every 5 (five) minutes as needed for chest pain. 25 tablet 3  . pioglitazone (ACTOS) 30 MG tablet TAKE 1 TABLET DAILY 90 tablet 3  . potassium chloride SA (K-DUR,KLOR-CON) 20 MEQ tablet Take 20 mEq by mouth 2 (two) times daily as needed (Take with lasix).     Marland Kitchen traMADol (ULTRAM) 50 MG tablet Take 1-2 tablets (50-100 mg total) by mouth every 4 (four) hours as needed for moderate pain. 60 tablet 0  . triamcinolone cream (KENALOG) 0.1 % Apply 1 application topically 2 (two) times daily as needed. 30 g 0  . enoxaparin (LOVENOX) 40 MG/0.4ML injection Inject 0.4 mLs (40 mg total) into the skin daily for 14 days. 14 Syringe 0   No  current facility-administered medications on file prior to visit.     Allergies  Allergen Reactions  . Crestor [Rosuvastatin] Other (See Comments)    Muscle aches.  . Doxazosin Mesylate Other (See Comments)    REACTION: didn't work and may have caused SOB  . Doxycycline Other (See Comments)    REACTION: unspecified  . Glimepiride Other (See Comments)    REACTION: red eyes  . Glipizide Other (See Comments)    REACTION: myalgia??  . Losartan Potassium-Hctz Other (See Comments)    REACTION: chest \\T \ leg pain  . Metoprolol Tartrate Other (See Comments)    Mood swings, memory changes  . Pravastatin Other (See Comments)    Muscle aches   . Rosiglitazone Maleate Other (See Comments)    REACTION: myalgia  . Simvastatin Other (See Comments)    REACTION: myalgias  . Tetracyclines & Related   . Carvedilol Other (See Comments)    Felt bad    Past Medical History:  Diagnosis Date  . Angina   . Arthritis   . BPH (benign prostatic hypertrophy)   . Carpal tunnel syndrome   . Coronary artery disease 11/12/11   s/p CABG x 3  . Diabetes mellitus    type 2  . GERD (gastroesophageal reflux disease)   . Hx of colonic polyps   . Hyperlipidemia   .  Hypertension   . Kidney stones   . OSA (obstructive sleep apnea)    uses cpap  . Shortness of breath     Past Surgical History:  Procedure Laterality Date  . CORONARY ARTERY BYPASS GRAFT  11/12/2011   Procedure: CORONARY ARTERY BYPASS GRAFTING (CABG);  Surgeon: Grace Isaac, MD;  Location: [place] [facility];  Service: Open Heart Surgery;  Laterality: N/A;  Coronary Artery Bypass Graft times three on pump utilizing left internal mammary artery and right saphenous vein harvested endoscopically   . HERNIA REPAIR    . KNEE ARTHROPLASTY Right 11/23/2017   Procedure: COMPUTER ASSISTED TOTAL KNEE ARTHROPLASTY;  Surgeon: Dereck Leep, MD;  Location: [facility];  Service: Orthopedics;  Laterality: Right;  . KNEE SURGERY     left  . MENISCUS REPAIR   03/08   right knee    Family History  Problem Relation Age of Onset  . Coronary artery disease Father   . Prostate cancer Father   . Diabetes Neg Hx   . Hypertension Neg Hx     Social History   Socioeconomic History  . Marital status: Married    Spouse name: Not on file  . Number of children: 3  . Years of education: Not on file  . Highest education level: Not on file  Social Needs  . Financial resource strain: Not on file  . Food insecurity - worry: Not on file  . Food insecurity - inability: Not on file  . Transportation needs - medical: Not on file  . Transportation needs - non-medical: Not on file  Occupational History  . Occupation: Surveyor, quantity  Tobacco Use  . Smoking status: Never Smoker  . Smokeless tobacco: Never Used  Substance and Sexual Activity  . Alcohol use: No  . Drug use: No  . Sexual activity: Yes  Other Topics Concern  . Not on file  Social History Narrative   No living will   Requests wife as health care POA   Would accept resuscitation   Would accept tube feedings   Review of Systems Appetite still hasn't returned Has lost close to 20#    Objective:   Physical Exam  Constitutional: No distress.  Psychiatric: He has a normal mood and affect. His behavior is normal.          Assessment & Plan:

## 2017-12-16 NOTE — Assessment & Plan Note (Signed)
Last hydrocodone from Dr Hooten--12/31 Will take over now--due end of month [place]

## 2017-12-20 DIAGNOSIS — Z96651 Presence of right artificial knee joint: Secondary | ICD-10-CM | POA: Diagnosis not present

## 2017-12-23 DIAGNOSIS — Z96651 Presence of right artificial knee joint: Secondary | ICD-10-CM | POA: Diagnosis not present

## 2017-12-28 DIAGNOSIS — Z96651 Presence of right artificial knee joint: Secondary | ICD-10-CM | POA: Diagnosis not present

## 2018-01-02 DIAGNOSIS — M25561 Pain in right knee: Secondary | ICD-10-CM | POA: Diagnosis not present

## 2018-01-02 DIAGNOSIS — Z96651 Presence of right artificial knee joint: Secondary | ICD-10-CM | POA: Diagnosis not present

## 2018-01-05 DIAGNOSIS — Z96651 Presence of right artificial knee joint: Secondary | ICD-10-CM | POA: Diagnosis not present

## 2018-01-09 ENCOUNTER — Other Ambulatory Visit: Payer: Self-pay | Admitting: Internal Medicine

## 2018-01-09 NOTE — Telephone Encounter (Signed)
Copied from [place] [id number]. Topic: Quick Communication - Rx Refill/Question >> Jan 09, 2018  5:19 PM [place], Laban Emperor wrote: Medication:  HYDROcodone-acetaminophen (NORCO/VICODIN) 5-325 MG tablet [[id number]]    Has the patient contacted their pharmacy? No. ([facility] no longer in business)   (Agent: If no, request that the patient contact the pharmacy for the refill.)   Preferred Pharmacy (with phone number or street name): [facility] [address], [place] - [place] [phone] (Phone) [phone] (Fax)     Agent: Please be advised that RX refills may take up to 3 business days. We ask that you follow-up with your pharmacy.

## 2018-01-09 NOTE — Telephone Encounter (Signed)
Norco 5-325mg  refill Last OV: 12/16/17 with Dr. Silvio Pate Last Refill:12/05/17 by historical provider [place], [place]

## 2018-01-10 DIAGNOSIS — Z96651 Presence of right artificial knee joint: Secondary | ICD-10-CM | POA: Diagnosis not present

## 2018-01-10 DIAGNOSIS — M25561 Pain in right knee: Secondary | ICD-10-CM | POA: Diagnosis not present

## 2018-01-10 MED ORDER — HYDROCODONE-ACETAMINOPHEN 5-325 MG PO TABS: 1.0000 | ORAL_TABLET | Freq: Two times a day (BID) | ORAL | 0 refills | Status: DC

## 2018-01-10 NOTE — Telephone Encounter (Signed)
  Pt requesting refill Hydrocodone apap Last refilled and qty; # 60 on 11/22/17 on hx med list Last seen:12/16/17 Pharmacy:[facility] [address] UDS: 03/16/17.

## 2018-01-10 NOTE — Telephone Encounter (Signed)
Left message on vm per dpr that rx was sent to [facility]

## 2018-01-17 DIAGNOSIS — Z96651 Presence of right artificial knee joint: Secondary | ICD-10-CM | POA: Diagnosis not present

## 2018-01-19 DIAGNOSIS — M25561 Pain in right knee: Secondary | ICD-10-CM | POA: Diagnosis not present

## 2018-01-19 DIAGNOSIS — Z96651 Presence of right artificial knee joint: Secondary | ICD-10-CM | POA: Diagnosis not present

## 2018-02-20 ENCOUNTER — Other Ambulatory Visit: Payer: Self-pay | Admitting: Internal Medicine

## 2018-02-20 MED ORDER — HYDROCODONE-ACETAMINOPHEN 5-325 MG PO TABS: 1.0000 | ORAL_TABLET | Freq: Two times a day (BID) | ORAL | 0 refills | Status: DC

## 2018-02-20 NOTE — Telephone Encounter (Signed)
Spoke to pt. Rx up front ready for pickup 

## 2018-02-20 NOTE — Telephone Encounter (Signed)
Copied from [place]. Topic: Inquiry >> Feb 20, 2018  1:02 PM Pricilla Handler wrote: Reason for CRM: Patient called requesting a refill of HYDROcodone-acetaminophen (NORCO/VICODIN) 5-325 MG tablet. Patient has requested to come into the office to pick up the paper copy of the prescription.

## 2018-02-20 NOTE — Telephone Encounter (Signed)
  Pt requesting refill Hydrocodone apap Last refilled and qty;# 60 on 01/10/18 Last seen:12/16/17 UDS: 03/16/17 Pt wants to pick up printed rx.

## 2018-02-23 ENCOUNTER — Encounter: Payer: Self-pay | Admitting: Internal Medicine

## 2018-02-23 ENCOUNTER — Ambulatory Visit (INDEPENDENT_AMBULATORY_CARE_PROVIDER_SITE_OTHER): Payer: BLUE CROSS/BLUE SHIELD | Admitting: Internal Medicine

## 2018-02-23 VITALS — BP 140/88 | HR 73 | Ht 65.0 in | Wt 200.0 lb

## 2018-02-23 DIAGNOSIS — G4733 Obstructive sleep apnea (adult) (pediatric): Secondary | ICD-10-CM | POA: Diagnosis not present

## 2018-02-23 DIAGNOSIS — J3089 Other allergic rhinitis: Secondary | ICD-10-CM | POA: Diagnosis not present

## 2018-02-23 DIAGNOSIS — J302 Other seasonal allergic rhinitis: Secondary | ICD-10-CM | POA: Diagnosis not present

## 2018-02-23 NOTE — Progress Notes (Signed)
Subjective:    Patient ID: [name] [name], male    DOB: Mar 18, 1951, 67 y.o.   MRN: [id number]  HPI male never smoker followed for OSA, complicated by DM 2, CAD, HBP NPSG 1998  AHI 58/ hr  --------------------------------------------------------------------------  11/17/2016-67 year old male never smoker followed for OSA, complicated by DM 2, CAD, HBP NPSG 1998  AHI 58/ hr Former pt. of [facility]-Wears CPAP avg. 7 hrs. each night,has longer hose that disconnects during the night,pr. good,mask good. CPAP 10/ [facility]       No download is available Current machine about 68 years old. He definitely sleeps better with it. Says he can't sleep without it. After losing power recently, he asks about having a battery operated CPAP machine available. Doesn't use humidifier. He never took his machine to be changed to auto Pap after last visit here, and says he is very comfortable at a pressure of 10. No reported snoring through and little or no daytime sleepiness. No ENT surgery. Daytime breathing otherwise comfortable.  02/23/18- 68 year old male never smoker followed for OSA, complicated by DM 2, CAD/ CABG, HBP, GERD, chronic pain/narcotic dependence, CPAP 10/[employer] ----OSA: DME [facility] Pt states he wears CPAP nightly no new supplies needed at this time. No DL at this time.  Got through right total knee replacement in December without problems. Current CPAP machine is 12 or 68 years old.  He reports sleeping all night every night with it and sleeping better with CPAP than without. Asks advice about managing seasonal allergic rhinitis as that starts.  ROS-see HPI   + = positive Constitutional:    weight loss, night sweats, fevers, chills, fatigue, lassitude. HEENT:    headaches, difficulty swallowing, tooth/dental problems, sore throat,       +sneezing, itching, ear ache, +nasal congestion, post nasal drip, snoring CV:    chest pain, orthopnea, PND, swelling in lower extremities, anasarca,                                                    dizziness, palpitations Resp:   shortness of breath with exertion or at rest.                productive cough,   non-productive cough, coughing up of blood.              change in color of mucus.  wheezing.   Skin:    rash or lesions. GI:  No-   heartburn, indigestion, abdominal pain, nausea, vomiting, diarrhea,                 change in bowel habits, loss of appetite GU: dysuria, change in color of urine, no urgency or frequency.   flank pain. MS:   joint pain, stiffness, decreased range of motion, back pain. Neuro-     nothing unusual Psych:  change in mood or affect.  depression or anxiety.   memory loss.    Objective:  OBJ- Physical Exam General- Alert, Oriented, Affect-appropriate, Distress- none acute, + Full beard,  Skin- rash-none, lesions- none, excoriation- none Lymphadenopathy- none Head- atraumatic            Eyes- Gross vision intact, PERRLA, conjunctivae and secretions clear            Ears- Hearing, canals-normal  Nose- Clear, no-Septal dev, mucus, polyps, erosion, perforation             Throat- Mallampati II-III , mucosa clear , drainage- none, tonsils- atrophic Neck- flexible , trachea midline, no stridor , thyroid nl, carotid no bruit Chest - symmetrical excursion , unlabored           Heart/CV- RRR , no murmur , no gallop  , no rub, nl s1 s2                           - JVD- none , edema- none, stasis changes- none, varices- none           Lung- clear to P&A, wheeze- none, cough- none , dullness-none, rub- none           Chest wall-  Abd-  Br/ Gen/ Rectal- Not done, not indicated Extrem- cyanosis- none, clubbing, none, atrophy- none, strength- nl Neuro- grossly intact to observation    Assessment & Plan:

## 2018-02-23 NOTE — Patient Instructions (Signed)
We can continue CPAP 10, mask of choice, humidifier, supplies, and a download card from [facility]  For allergy symptoms- suggest a non-sedating antihistamine like Allegra/ fexofenadine, Zyrtec/ cetirizine, or Claritin/ loratadine         You can also use an otc nasal spray: Flonase/ fluticasone    1-2 puffs each nostril every night at bedtime.   Please call if we can help

## 2018-03-01 DIAGNOSIS — J3089 Other allergic rhinitis: Secondary | ICD-10-CM

## 2018-03-01 DIAGNOSIS — J302 Other seasonal allergic rhinitis: Secondary | ICD-10-CM | POA: Insufficient documentation

## 2018-03-01 NOTE — Assessment & Plan Note (Signed)
Good compliance and control.  He sleeps better with CPAP and is happy to continue without change. Plan-continue CPAP 10

## 2018-03-01 NOTE — Assessment & Plan Note (Signed)
[place] tree pollens are beginning to affect him now.  We discussed getting a head of the problem with Flonase, Claritin, and with occasional Sudafed if needed.

## 2018-03-20 ENCOUNTER — Ambulatory Visit (INDEPENDENT_AMBULATORY_CARE_PROVIDER_SITE_OTHER): Payer: BLUE CROSS/BLUE SHIELD | Admitting: Internal Medicine

## 2018-03-20 ENCOUNTER — Encounter: Payer: Self-pay | Admitting: Internal Medicine

## 2018-03-20 VITALS — BP 108/64 | HR 76 | Temp 97.9°F | Ht 64.75 in | Wt 201.0 lb

## 2018-03-20 DIAGNOSIS — N138 Other obstructive and reflux uropathy: Secondary | ICD-10-CM | POA: Diagnosis not present

## 2018-03-20 DIAGNOSIS — Z0000 Encounter for general adult medical examination without abnormal findings: Secondary | ICD-10-CM

## 2018-03-20 DIAGNOSIS — E118 Type 2 diabetes mellitus with unspecified complications: Secondary | ICD-10-CM

## 2018-03-20 DIAGNOSIS — N401 Enlarged prostate with lower urinary tract symptoms: Secondary | ICD-10-CM | POA: Diagnosis not present

## 2018-03-20 DIAGNOSIS — I2510 Atherosclerotic heart disease of native coronary artery without angina pectoris: Secondary | ICD-10-CM

## 2018-03-20 DIAGNOSIS — Z1211 Encounter for screening for malignant neoplasm of colon: Secondary | ICD-10-CM | POA: Diagnosis not present

## 2018-03-20 DIAGNOSIS — Z789 Other specified health status: Secondary | ICD-10-CM

## 2018-03-20 DIAGNOSIS — F1120 Opioid dependence, uncomplicated: Secondary | ICD-10-CM

## 2018-03-20 LAB — LIPID PANEL
CHOL/HDL RATIO: 4
Cholesterol: 209 mg/dL — ABNORMAL HIGH (ref 0–200)
HDL: 49.1 mg/dL (ref >39.00)
LDL Cholesterol: 130 mg/dL — ABNORMAL HIGH (ref 0–99)
NONHDL: 159.98
TRIGLYCERIDES: 148 mg/dL (ref 0.0–149.0)
VLDL: 29.6 mg/dL (ref 0.0–40.0)

## 2018-03-20 LAB — PSA: PSA: 0.83 ng/mL (ref 0.10–4.00)

## 2018-03-20 LAB — HM DIABETES FOOT EXAM

## 2018-03-20 LAB — HEMOGLOBIN A1C: Hgb A1c MFr Bld: 8.4 % — ABNORMAL HIGH (ref 4.6–6.5)

## 2018-03-20 MED ORDER — HYDROCODONE-ACETAMINOPHEN 5-325 MG PO TABS: 1.0000 | ORAL_TABLET | Freq: Two times a day (BID) | ORAL | 0 refills | Status: DC

## 2018-03-20 MED ORDER — POTASSIUM CHLORIDE CRYS ER 20 MEQ PO TBCR: 20.0000 meq | EXTENDED_RELEASE_TABLET | Freq: Two times a day (BID) | ORAL | 3 refills | Status: DC | PRN

## 2018-03-20 NOTE — Assessment & Plan Note (Signed)
Back on his routine hydrocodone CSRS okay Will do UDS

## 2018-03-20 NOTE — Assessment & Plan Note (Signed)
Hopefully still good control Will check A1c 

## 2018-03-20 NOTE — Assessment & Plan Note (Signed)
Mild symptoms Hold off on Rx

## 2018-03-20 NOTE — Assessment & Plan Note (Signed)
Healthy Prefers no flu vaccine or shingrix (will consider) Will check PSA FIT Stays active

## 2018-03-20 NOTE — Progress Notes (Signed)
Subjective:    Patient ID: [name] [name], male    DOB: [birth year], 67 y.o.   MRN: [id number]  HPI Here for physical---reviewed Medicare visit form also Just went back to work 1.5 weeks ago Has been able to tell standing on the concrete--but managing Still uses the hydrocodone regularly for the left knee--- one and occasionally 2 daily  Generally aching in shoulders, elbows Coming on for a while--but worse Trouble with lifting---strength not as good No history of gout--but has some pulling across top of foot Tried his wife's cherry juice--without help Especially achy in the morning  Hasn't been checking sugars--except rarely  No hypoglycemic reactions Recent eye exam No foot pain, numbness or tingling  Current Outpatient Medications on File Prior to Visit  Medication Sig Dispense Refill  . furosemide (LASIX) 40 MG tablet TAKE 1 TABLET TWICE A DAY AS NEEDED 90 tablet 0  . glucose blood (FREESTYLE LITE) test strip Use as instructed, patient tests once daily. Dx: 250.00 300 each 3  . HYDROcodone-acetaminophen (NORCO/VICODIN) 5-325 MG tablet Take 1 tablet by mouth 2 (two) times daily. 60 tablet 0  . INVOKANA 300 MG TABS tablet TAKE 1 TABLET DAILY BEFORE BREAKFAST 90 tablet 3  . lisinopril (PRINIVIL,ZESTRIL) 10 MG tablet TAKE 1 TABLET DAILY 90 tablet 1  . metFORMIN (GLUCOPHAGE) 1000 MG tablet Take 1,000 mg by mouth 2 (two) times daily with a meal.    . nitroGLYCERIN (NITROSTAT) 0.4 MG SL tablet Place 1 tablet (0.4 mg total) under the tongue every 5 (five) minutes as needed for chest pain. 25 tablet 3  . pioglitazone (ACTOS) 30 MG tablet TAKE 1 TABLET DAILY 90 tablet 3  . potassium chloride SA (K-DUR,KLOR-CON) 20 MEQ tablet Take 20 mEq by mouth 2 (two) times daily as needed (Take with lasix).     Marland Kitchen triamcinolone cream (KENALOG) 0.1 % Apply 1 application topically 2 (two) times daily as needed. 30 g 0   No current facility-administered medications on file prior to visit.      Allergies  Allergen Reactions  . Crestor [Rosuvastatin] Other (See Comments)    Muscle aches.  . Doxazosin Mesylate Other (See Comments)    REACTION: didn't work and may have caused SOB  . Doxycycline Other (See Comments)    REACTION: unspecified  . Glimepiride Other (See Comments)    REACTION: red eyes  . Glipizide Other (See Comments)    REACTION: myalgia??  . Losartan Potassium-Hctz Other (See Comments)    REACTION: chest \\T \ leg pain  . Metoprolol Tartrate Other (See Comments)    Mood swings, memory changes  . Pravastatin Other (See Comments)    Muscle aches   . Rosiglitazone Maleate Other (See Comments)    REACTION: myalgia  . Simvastatin Other (See Comments)    REACTION: myalgias  . Tetracyclines & Related   . Carvedilol Other (See Comments)    Felt bad    Past Medical History:  Diagnosis Date  . Angina   . Arthritis   . BPH (benign prostatic hypertrophy)   . Carpal tunnel syndrome   . Coronary artery disease 11/12/11   s/p CABG x 3  . Diabetes mellitus    type 2  . GERD (gastroesophageal reflux disease)   . Hx of colonic polyps   . Hyperlipidemia   . Hypertension   . Kidney stones   . OSA (obstructive sleep apnea)    uses cpap  . Shortness of breath     Past Surgical History:  Procedure Laterality Date  . CORONARY ARTERY BYPASS GRAFT  11/12/2011   Procedure: CORONARY ARTERY BYPASS GRAFTING (CABG);  Surgeon: Grace Isaac, MD;  Location: [place] [facility];  Service: Open Heart Surgery;  Laterality: N/A;  Coronary Artery Bypass Graft times three on pump utilizing left internal mammary artery and right saphenous vein harvested endoscopically   . HERNIA REPAIR    . KNEE ARTHROPLASTY Right 11/23/2017   Procedure: COMPUTER ASSISTED TOTAL KNEE ARTHROPLASTY;  Surgeon: Dereck Leep, MD;  Location: [facility];  Service: Orthopedics;  Laterality: Right;  . KNEE SURGERY     left  . MENISCUS REPAIR  03/08   right knee    Family History  Problem Relation Age of  Onset  . Coronary artery disease Father   . Prostate cancer Father   . Diabetes Neg Hx   . Hypertension Neg Hx     Social History   Socioeconomic History  . Marital status: Married    Spouse name: Not on file  . Number of children: 3  . Years of education: Not on file  . Highest education level: Not on file  Occupational History  . Occupation: Surveyor, quantity  Social Needs  . Financial resource strain: Not on file  . Food insecurity:    Worry: Not on file    Inability: Not on file  . Transportation needs:    Medical: Not on file    Non-medical: Not on file  Tobacco Use  . Smoking status: Never Smoker  . Smokeless tobacco: Never Used  Substance and Sexual Activity  . Alcohol use: No  . Drug use: No  . Sexual activity: Yes  Lifestyle  . Physical activity:    Days per week: Not on file    Minutes per session: Not on file  . Stress: Not on file  Relationships  . Social connections:    Talks on phone: Not on file    Gets together: Not on file    Attends religious service: Not on file    Active member of club or organization: Not on file    Attends meetings of clubs or organizations: Not on file    Relationship status: Not on file  . Intimate partner violence:    Fear of current or ex partner: Not on file    Emotionally abused: Not on file    Physically abused: Not on file    Forced sexual activity: Not on file  Other Topics Concern  . Not on file  Social History Narrative   No living will   Requests wife as health care POA   Would accept resuscitation   Would accept tube feedings   Review of Systems  Constitutional: Negative for fatigue and unexpected weight change.       Sporadic with seat belt---discussed   HENT: Negative for hearing loss and tinnitus.        Only 2 teeth left---got abscess but treated. Partials lower, full plate on top  Eyes: Negative for visual disturbance.       No diplopia or unilateral vision loss  Respiratory: Negative for cough, chest  tightness and shortness of breath.   Cardiovascular: Positive for leg swelling. Negative for chest pain and palpitations.  Gastrointestinal: Negative for abdominal pain, blood in stool and constipation.       No heartburn  Genitourinary: Negative for dysuria and urgency.       Mildly slow stream No sex--no problem  Musculoskeletal: Positive for arthralgias. Negative for joint swelling.  Skin: Negative for rash.       Cream helping spot behind his ear  Allergic/Immunologic: Positive for environmental allergies. Negative for immunocompromised state.  Neurological: Negative for dizziness, syncope, light-headedness and headaches.  Hematological: Negative for adenopathy. Bruises/bleeds easily.  Psychiatric/Behavioral: Positive for sleep disturbance. Negative for dysphoric mood. The patient is not nervous/anxious.        Legs jumping after surgery and not sleeping well. Now intermittent--mostly okay       Objective:   Physical Exam  Constitutional: He is oriented to person, place, and time. He appears well-developed. No distress.  HENT:  Head: Normocephalic and atraumatic.  Right Ear: External ear normal.  Left Ear: External ear normal.  Mouth/Throat: Oropharynx is clear and moist. No oropharyngeal exudate.  Eyes: Pupils are equal, round, and reactive to light. Conjunctivae are normal.  Neck: No thyromegaly present.  Cardiovascular: Normal rate, regular rhythm and intact distal pulses. Exam reveals no gallop.  No murmur heard. Pulmonary/Chest: Effort normal and breath sounds normal. No respiratory distress. He has no wheezes. He has no rales.  Abdominal: Soft. There is no tenderness.  Musculoskeletal: He exhibits no edema or tenderness.  No active synovitis in shoulders. Fair ROM, slight tenderness on right Elbows normal  Lymphadenopathy:    He has no cervical adenopathy.  Neurological: He is alert and oriented to person, place, and time.  Normal sensation in feet  Skin: No rash  noted.  No foot lesions  Psychiatric: He has a normal mood and affect. His behavior is normal.          Assessment & Plan:

## 2018-03-20 NOTE — Addendum Note (Signed)
Addended by: Pilar Grammes on: 03/20/2018 11:29 AM   Modules accepted: Orders

## 2018-03-20 NOTE — Assessment & Plan Note (Signed)
Quiet now On appropriate therapy --but intolerant of statins

## 2018-03-20 NOTE — Assessment & Plan Note (Signed)
Has failed multiple statins

## 2018-03-20 NOTE — Progress Notes (Signed)
Hearing Screening Comments: Tested at [facility] May 2018 for work Vision Screening Comments: May 2018

## 2018-03-21 ENCOUNTER — Other Ambulatory Visit: Payer: Self-pay | Admitting: Internal Medicine

## 2018-03-21 DIAGNOSIS — E118 Type 2 diabetes mellitus with unspecified complications: Secondary | ICD-10-CM

## 2018-03-23 LAB — PAIN MGMT, PROFILE 8 W/CONF, U
6 ACETYLMORPHINE: NEGATIVE ng/mL (ref <10)
AMPHETAMINES: NEGATIVE ng/mL (ref <500)
Alcohol Metabolites: NEGATIVE ng/mL (ref <500)
Benzodiazepines: NEGATIVE ng/mL (ref <100)
Buprenorphine, Urine: NEGATIVE ng/mL (ref <5)
COCAINE METABOLITE: NEGATIVE ng/mL (ref <150)
Codeine: NEGATIVE ng/mL (ref <50)
Creatinine: 70.2 mg/dL
Hydrocodone: 160 ng/mL — ABNORMAL HIGH (ref <50)
Hydromorphone: 53 ng/mL — ABNORMAL HIGH (ref <50)
MDMA: NEGATIVE ng/mL (ref <500)
Marijuana Metabolite: NEGATIVE ng/mL (ref <20)
Morphine: NEGATIVE ng/mL (ref <50)
Norhydrocodone: 348 ng/mL — ABNORMAL HIGH (ref <50)
OXIDANT: NEGATIVE ug/mL (ref <200)
Opiates: POSITIVE ng/mL — AB (ref <100)
Oxycodone: NEGATIVE ng/mL (ref <100)
pH: 6.3 (ref 4.5–9.0)

## 2018-04-07 ENCOUNTER — Telehealth: Payer: Self-pay | Admitting: Internal Medicine

## 2018-04-07 DIAGNOSIS — Z0000 Encounter for general adult medical examination without abnormal findings: Secondary | ICD-10-CM

## 2018-04-07 NOTE — Telephone Encounter (Signed)
PT dropped off health screening form to be filled out. CPE 03/20/18. I placed in Rx tower. Pt said he will need the nicotine labs done.

## 2018-04-10 NOTE — Telephone Encounter (Signed)
I have the form and have filled out what I can. He needs waist circumference, glucose, and nicotine testing to complete the form.   Dr Silvio Pate is out of the office until May 9. I will keep the form at my desk. Once Dr Silvio Pate puts in the orders for his labs, I will call him to schedule a lab appt.

## 2018-04-11 NOTE — Telephone Encounter (Signed)
Left message to call office. He needs to schedule a fasting lab appt.

## 2018-04-11 NOTE — Telephone Encounter (Signed)
Orders placed.

## 2018-04-12 ENCOUNTER — Other Ambulatory Visit (INDEPENDENT_AMBULATORY_CARE_PROVIDER_SITE_OTHER): Payer: BLUE CROSS/BLUE SHIELD

## 2018-04-12 DIAGNOSIS — Z79899 Other long term (current) drug therapy: Secondary | ICD-10-CM | POA: Diagnosis not present

## 2018-04-12 DIAGNOSIS — Z0000 Encounter for general adult medical examination without abnormal findings: Secondary | ICD-10-CM | POA: Diagnosis not present

## 2018-04-12 LAB — GLUCOSE, RANDOM: Glucose, Bld: 154 mg/dL — ABNORMAL HIGH (ref 70–99)

## 2018-04-12 NOTE — Telephone Encounter (Signed)
Lab appt completed 5/8

## 2018-04-14 ENCOUNTER — Telehealth: Payer: Self-pay

## 2018-04-14 LAB — NICOTINE/COTININE METABOLITES
Cotinine: NOT DETECTED ng/mL
Nicotine: NOT DETECTED ng/mL

## 2018-04-14 NOTE — Telephone Encounter (Signed)
Copied from [place] [id number]. Topic: Quick Communication - Office Called Patient >> Apr 14, 2018 12:04 PM Pilar Grammes, CMA wrote: Reason for CRM: I need to know his waist circumference for his insurance form

## 2018-04-18 ENCOUNTER — Other Ambulatory Visit (INDEPENDENT_AMBULATORY_CARE_PROVIDER_SITE_OTHER): Payer: BLUE CROSS/BLUE SHIELD

## 2018-04-18 DIAGNOSIS — Z1211 Encounter for screening for malignant neoplasm of colon: Secondary | ICD-10-CM | POA: Diagnosis not present

## 2018-04-18 LAB — FECAL OCCULT BLOOD, IMMUNOCHEMICAL: FECAL OCCULT BLD: NEGATIVE

## 2018-04-18 NOTE — Telephone Encounter (Signed)
Spoke to pt. He advised me he had called back with the information I was needing, but no one documented it. I apologized. Got the info from him, faxed it to the company, and mailed him a copy of everything.

## 2018-04-19 DIAGNOSIS — Z471 Aftercare following joint replacement surgery: Secondary | ICD-10-CM | POA: Diagnosis not present

## 2018-04-26 DIAGNOSIS — H52221 Regular astigmatism, right eye: Secondary | ICD-10-CM | POA: Diagnosis not present

## 2018-04-26 DIAGNOSIS — H2513 Age-related nuclear cataract, bilateral: Secondary | ICD-10-CM | POA: Diagnosis not present

## 2018-04-26 DIAGNOSIS — H524 Presbyopia: Secondary | ICD-10-CM | POA: Diagnosis not present

## 2018-04-26 DIAGNOSIS — E113293 Type 2 diabetes mellitus with mild nonproliferative diabetic retinopathy without macular edema, bilateral: Secondary | ICD-10-CM | POA: Diagnosis not present

## 2018-04-26 DIAGNOSIS — H10413 Chronic giant papillary conjunctivitis, bilateral: Secondary | ICD-10-CM | POA: Diagnosis not present

## 2018-04-26 DIAGNOSIS — H40053 Ocular hypertension, bilateral: Secondary | ICD-10-CM | POA: Diagnosis not present

## 2018-04-26 DIAGNOSIS — H5203 Hypermetropia, bilateral: Secondary | ICD-10-CM | POA: Diagnosis not present

## 2018-04-26 DIAGNOSIS — H5211 Myopia, right eye: Secondary | ICD-10-CM | POA: Diagnosis not present

## 2018-05-10 ENCOUNTER — Other Ambulatory Visit: Payer: Self-pay | Admitting: Internal Medicine

## 2018-05-10 NOTE — Telephone Encounter (Signed)
Name of Medication: Hydrocodone Name of Pharmacy: [facility] or Written Date and Quantity: 03-24-18 #60      Last Office Visit and Type: 03-20-18 CPE Next Office Visit and Type: 06-20-18 3 Month Follow-up Last Controlled Substance Agreement Date: 03-20-18 Last UDS: 03-20-18

## 2018-05-23 DIAGNOSIS — Z96651 Presence of right artificial knee joint: Secondary | ICD-10-CM | POA: Diagnosis not present

## 2018-05-31 ENCOUNTER — Other Ambulatory Visit: Payer: Self-pay | Admitting: Cardiovascular Disease

## 2018-05-31 ENCOUNTER — Other Ambulatory Visit: Payer: Self-pay | Admitting: Internal Medicine

## 2018-06-20 ENCOUNTER — Other Ambulatory Visit: Payer: Medicare Other

## 2018-06-20 ENCOUNTER — Ambulatory Visit (INDEPENDENT_AMBULATORY_CARE_PROVIDER_SITE_OTHER): Payer: BLUE CROSS/BLUE SHIELD | Admitting: Internal Medicine

## 2018-06-20 ENCOUNTER — Encounter: Payer: Self-pay | Admitting: Internal Medicine

## 2018-06-20 VITALS — BP 110/72 | HR 71 | Temp 97.7°F | Ht 65.0 in | Wt 200.0 lb

## 2018-06-20 DIAGNOSIS — F1120 Opioid dependence, uncomplicated: Secondary | ICD-10-CM

## 2018-06-20 DIAGNOSIS — G894 Chronic pain syndrome: Secondary | ICD-10-CM | POA: Diagnosis not present

## 2018-06-20 DIAGNOSIS — I2510 Atherosclerotic heart disease of native coronary artery without angina pectoris: Secondary | ICD-10-CM

## 2018-06-20 DIAGNOSIS — E118 Type 2 diabetes mellitus with unspecified complications: Secondary | ICD-10-CM

## 2018-06-20 LAB — POCT GLYCOSYLATED HEMOGLOBIN (HGB A1C): Hemoglobin A1C: 7.7 % — AB (ref 4.0–5.6)

## 2018-06-20 MED ORDER — HYDROCODONE-ACETAMINOPHEN 5-325 MG PO TABS: 1.0000 | ORAL_TABLET | Freq: Two times a day (BID) | ORAL | 0 refills | Status: DC

## 2018-06-20 NOTE — Progress Notes (Signed)
Subjective:    Patient ID: [name] [name], male    DOB: [birth year], 67 y.o.   MRN: [id number]  HPI Here for follow up of chronic pain and narcotic dependence Also follow up for diabetes uncontrolled  Having left shoulder pain Wonders about a cortisone shot---it helped the right shoulder Especially bad at night--aches in bed but better if he lies on it Fairly good ROM  Chronic pain about the same Just back to work---left knee hurting more back on concrete Still some stinging and burning on right knee Still limited flexibility  Has made some changes with diet More fruit Avoiding junk  Current Outpatient Medications on File Prior to Visit  Medication Sig Dispense Refill  . furosemide (LASIX) 40 MG tablet TAKE 1 TABLET TWICE A DAY AS NEEDED 90 tablet 0  . glucose blood (FREESTYLE LITE) test strip Use as instructed, patient tests once daily. Dx: 250.00 300 each 3  . HYDROcodone-acetaminophen (NORCO/VICODIN) 5-325 MG tablet TAKE 1 TABLET BY MOUTH 2 TIMES DAILY 60 tablet 0  . INVOKANA 300 MG TABS tablet TAKE 1 TABLET DAILY BEFORE BREAKFAST 90 tablet 3  . lisinopril (PRINIVIL,ZESTRIL) 10 MG tablet TAKE 1 TABLET DAILY 90 tablet 1  . metFORMIN (GLUCOPHAGE) 1000 MG tablet Take 1,000 mg by mouth 2 (two) times daily with a meal.    . nitroGLYCERIN (NITROSTAT) 0.4 MG SL tablet Place 1 tablet (0.4 mg total) under the tongue every 5 (five) minutes as needed for chest pain. 25 tablet 3  . pioglitazone (ACTOS) 30 MG tablet TAKE 1 TABLET DAILY 90 tablet 3  . potassium chloride SA (K-DUR,KLOR-CON) 20 MEQ tablet Take 1 tablet (20 mEq total) by mouth 2 (two) times daily as needed (Take with lasix). 90 tablet 3  . triamcinolone cream (KENALOG) 0.1 % Apply 1 application topically 2 (two) times daily as needed. 30 g 0   No current facility-administered medications on file prior to visit.     Allergies  Allergen Reactions  . Crestor [Rosuvastatin] Other (See Comments)    Muscle aches.  .  Doxazosin Mesylate Other (See Comments)    REACTION: didn't work and may have caused SOB  . Doxycycline Other (See Comments)    REACTION: unspecified  . Glimepiride Other (See Comments)    REACTION: red eyes  . Glipizide Other (See Comments)    REACTION: myalgia??  . Losartan Potassium-Hctz Other (See Comments)    REACTION: chest \\T \ leg pain  . Metoprolol Tartrate Other (See Comments)    Mood swings, memory changes  . Pravastatin Other (See Comments)    Muscle aches   . Rosiglitazone Maleate Other (See Comments)    REACTION: myalgia  . Simvastatin Other (See Comments)    REACTION: myalgias  . Tetracyclines & Related   . Carvedilol Other (See Comments)    Felt bad    Past Medical History:  Diagnosis Date  . Angina   . Arthritis   . BPH (benign prostatic hypertrophy)   . Carpal tunnel syndrome   . Coronary artery disease 11/12/11   s/p CABG x 3  . Diabetes mellitus    type 2  . GERD (gastroesophageal reflux disease)   . Hx of colonic polyps   . Hyperlipidemia   . Hypertension   . Kidney stones   . OSA (obstructive sleep apnea)    uses cpap  . Shortness of breath     Past Surgical History:  Procedure Laterality Date  . CORONARY ARTERY BYPASS GRAFT  11/12/2011  Procedure: CORONARY ARTERY BYPASS GRAFTING (CABG);  Surgeon: Grace Isaac, MD;  Location: [place];  Service: Open Heart Surgery;  Laterality: N/A;  Coronary Artery Bypass Graft times three on pump utilizing left internal mammary artery and right saphenous vein harvested endoscopically   . HERNIA REPAIR    . KNEE ARTHROPLASTY Right 11/23/2017   Procedure: COMPUTER ASSISTED TOTAL KNEE ARTHROPLASTY;  Surgeon: Dereck Leep, MD;  Location: [facility];  Service: Orthopedics;  Laterality: Right;  . KNEE SURGERY     left  . MENISCUS REPAIR  03/08   right knee    Family History  Problem Relation Age of Onset  . Coronary artery disease Father   . Prostate cancer Father   . Diabetes Neg Hx   . Hypertension  Neg Hx     Social History   Socioeconomic History  . Marital status: Married    Spouse name: Not on file  . Number of children: 3  . Years of education: Not on file  . Highest education level: Not on file  Occupational History  . Occupation: Surveyor, quantity  Social Needs  . Financial resource strain: Not on file  . Food insecurity:    Worry: Not on file    Inability: Not on file  . Transportation needs:    Medical: Not on file    Non-medical: Not on file  Tobacco Use  . Smoking status: Never Smoker  . Smokeless tobacco: Never Used  Substance and Sexual Activity  . Alcohol use: No  . Drug use: No  . Sexual activity: Yes  Lifestyle  . Physical activity:    Days per week: Not on file    Minutes per session: Not on file  . Stress: Not on file  Relationships  . Social connections:    Talks on phone: Not on file    Gets together: Not on file    Attends religious service: Not on file    Active member of club or organization: Not on file    Attends meetings of clubs or organizations: Not on file    Relationship status: Not on file  . Intimate partner violence:    Fear of current or ex partner: Not on file    Emotionally abused: Not on file    Physically abused: Not on file    Forced sexual activity: Not on file  Other Topics Concern  . Not on file  Social History Narrative   No living will   Requests wife as health care POA   Would accept resuscitation   Would accept tube feedings   Review of Systems Weight stable Appetite is okay    Objective:   Physical Exam  Constitutional: No distress.  Musculoskeletal:  Very slight crepitus in left shoulder No predominant tenderness ROM almost full in all spheres  Psychiatric: He has a normal mood and affect. His behavior is normal.           Assessment & Plan:

## 2018-06-20 NOTE — Assessment & Plan Note (Signed)
Left knee now the worst since right TKR Left shoulder not clearly arthritic---would consider cortisone shot if persists Ongoing hydrocodone use--- once or twice a day

## 2018-06-20 NOTE — Assessment & Plan Note (Signed)
Lab Results  Component Value Date   HGBA1C 7.7 (A) 06/20/2018   Is better with some dietary changes

## 2018-06-20 NOTE — Assessment & Plan Note (Signed)
Reviewed CSRS--no concerns 

## 2018-06-21 ENCOUNTER — Other Ambulatory Visit: Payer: Self-pay | Admitting: *Deleted

## 2018-06-21 ENCOUNTER — Telehealth: Payer: Self-pay | Admitting: Cardiovascular Disease

## 2018-06-21 MED ORDER — LISINOPRIL 10 MG PO TABS: 10.0000 mg | ORAL_TABLET | Freq: Every day | ORAL | 0 refills | Status: DC

## 2018-06-21 NOTE — Telephone Encounter (Signed)
Requested Prescriptions   Signed Prescriptions Disp Refills  . lisinopril (PRINIVIL,ZESTRIL) 10 MG tablet 90 tablet 0    Sig: Take 1 tablet (10 mg total) by mouth daily.    Authorizing Provider: Minna Merritts    Ordering User: Britt Bottom

## 2018-06-21 NOTE — Telephone Encounter (Signed)
°*  STAT* If patient is at the pharmacy, call can be transferred to refill team.   1. Which medications need to be refilled? (please list name of each medication and dose if known) Lisinopril   2. Which pharmacy/location (including street and city if local pharmacy) is [facility] sent [facility]  3. Do they need a 30 day or 90 day supply? 90 day

## 2018-07-02 NOTE — Progress Notes (Signed)
Cardiology Office Note  Date:  07/04/2018   ID:  [name] [name], DOB 1951/01/14, MRN [id number]  PCP:  Venia Carbon, MD   Chief Complaint  Patient presents with  . other    12 month follow up. Meds. reviewed by the pt. verbally. Pt. c/o pins sticking in feet at times.     HPI:  67 year-old gentleman with  obesity,  hyperlipidemia,  diabetes,  hypertension,  CAD, s/p CABG.  severe LAD disease, bypass surgery November 12 2011.  (Coronary artery bypass grafting x3 with the left internal mammary to the left anterior descending coronary artery, reversed saphenous vein graft sequentially to the first and third diagonals with right thigh and the vein harvesting.) Statin and zetia intolerant, myalgias and joint pain He presents for routine followup of his coronary artery disease   Pin pricks in feet and face He has tooth abscess left lower Has not seen his dentist, symptoms over the past 3 days started last Friday, today is Tuesday Requesting antibiotics  Still farming,  works on concrete with [employer] He continues to work with cattle  no regular exercise program Prior history of back pain, joint pain  Denies any chest pain concerning for angina No significant shortness of breath  Lab work reviewed with him HBA1C 7.7, was an 8.4 Trying to watch his diet  cholesterol above goal, long discussion with him concerning his numbers Does not want a statin or Zetia  EKG personally reviewed by myself on todays visit shows normal sinus rhythm with rate 60 bpm, first-degree AV block  Other past medical history unable to tolerate a statin including Crestor, Lipitor, simvastatin., zetia Myalgias, "inflammation"  Previous lab work, Hemoglobin A1c of 8, Total cholesterol 226  Previously had significant shortness of breath. This seems to have improved. Continues to have  bilateral knee pain.  On prior office visits, reported that After a day in his boots, knees are very sore.     unable to tolerate metoprolol secondary to memory problems and mood disorder. Unable to tolerate carvedilol secondary to fatigue  Previous  Echocardiogram  for his shortness of breath showed normal function, no significant pulmonary hypertension. Normal right ventricular systolic pressure.  PMH:   has a past medical history of Angina, Arthritis, BPH (benign prostatic hypertrophy), Carpal tunnel syndrome, Coronary artery disease (11/12/11), Diabetes mellitus, GERD (gastroesophageal reflux disease), colonic polyps, Hyperlipidemia, Hypertension, Kidney stones, OSA (obstructive sleep apnea), and Shortness of breath.  PSH:    Past Surgical History:  Procedure Laterality Date  . CORONARY ARTERY BYPASS GRAFT  11/12/2011   Procedure: CORONARY ARTERY BYPASS GRAFTING (CABG);  Surgeon: Grace Isaac, MD;  Location: [place];  Service: Open Heart Surgery;  Laterality: N/A;  Coronary Artery Bypass Graft times three on pump utilizing left internal mammary artery and right saphenous vein harvested endoscopically   . HERNIA REPAIR    . KNEE ARTHROPLASTY Right 11/23/2017   Procedure: COMPUTER ASSISTED TOTAL KNEE ARTHROPLASTY;  Surgeon: Dereck Leep, MD;  Location: [facility];  Service: Orthopedics;  Laterality: Right;  . KNEE SURGERY     left  . MENISCUS REPAIR  03/08   right knee    Current Outpatient Medications  Medication Sig Dispense Refill  . furosemide (LASIX) 40 MG tablet TAKE 1 TABLET TWICE A DAY AS NEEDED 90 tablet 0  . glucose blood (FREESTYLE LITE) test strip Use as instructed, patient tests once daily. Dx: 250.00 300 each 3  . HYDROcodone-acetaminophen (NORCO/VICODIN) 5-325 MG tablet Take 1  tablet by mouth 2 (two) times daily. 60 tablet 0  . INVOKANA 300 MG TABS tablet TAKE 1 TABLET DAILY BEFORE BREAKFAST 90 tablet 3  . lisinopril (PRINIVIL,ZESTRIL) 10 MG tablet Take 1 tablet (10 mg total) by mouth daily. 90 tablet 0  . metFORMIN (GLUCOPHAGE) 1000 MG tablet Take 1,000 mg by mouth 2  (two) times daily with a meal.    . nitroGLYCERIN (NITROSTAT) 0.4 MG SL tablet Place 1 tablet (0.4 mg total) under the tongue every 5 (five) minutes as needed for chest pain. 25 tablet 3  . pioglitazone (ACTOS) 30 MG tablet TAKE 1 TABLET DAILY 90 tablet 3  . potassium chloride SA (K-DUR,KLOR-CON) 20 MEQ tablet Take 1 tablet (20 mEq total) by mouth 2 (two) times daily as needed (Take with lasix). 90 tablet 3  . triamcinolone cream (KENALOG) 0.1 % Apply 1 application topically 2 (two) times daily as needed. 30 g 0   No current facility-administered medications for this visit.      Allergies:   Crestor [rosuvastatin]; Doxazosin mesylate; Doxycycline; Glimepiride; Glipizide; Losartan potassium-hctz; Metoprolol tartrate; Pravastatin; Rosiglitazone maleate; Simvastatin; Tetracyclines & related; and Carvedilol   Social History:  The patient  reports that he has never smoked. He has never used smokeless tobacco. He reports that he does not drink alcohol or use drugs.   Family History:   family history includes Coronary artery disease in his father; Prostate cancer in his father.    Review of Systems: Review of Systems  Constitutional: Negative.   HENT:       Tooth pain  Respiratory: Negative.   Cardiovascular: Negative.   Gastrointestinal: Negative.   Musculoskeletal: Positive for joint pain.  Neurological: Negative.   Psychiatric/Behavioral: Negative.   All other systems reviewed and are negative.    PHYSICAL EXAM: VS:  BP 138/70 (BP Location: Left Arm, Patient Position: Sitting, Cuff Size: Large)   Pulse 60   Ht 5\' 5"  (1.651 m)   Wt 205 lb 8 oz (93.2 kg)   BMI 34.20 kg/m  , BMI Body mass index is 34.2 kg/m.  Constitutional:  oriented to person, place, and time. No distress.  HENT:  Head: Normocephalic and atraumatic.  Eyes:  no discharge. No scleral icterus.  Neck: Normal range of motion. Neck supple. No JVD present.  Cardiovascular: Normal rate, regular rhythm, normal heart  sounds and intact distal pulses. Exam reveals no gallop and no friction rub. No edema No murmur heard. Pulmonary/Chest: Effort normal and breath sounds normal. No stridor. No respiratory distress.  no wheezes.  no rales.  no tenderness.  Abdominal: Soft.  no distension.  no tenderness.  Musculoskeletal: Normal range of motion.  no  tenderness or deformity.  Neurological:  normal muscle tone. Coordination normal. No atrophy Skin: Skin is warm and dry. No rash noted. not diaphoretic.  Psychiatric:  normal mood and affect. behavior is normal. Thought content normal.    Recent Labs: 11/10/2017: ALT 16; Hemoglobin 15.9; Platelets 229 11/25/2017: BUN 13; Creatinine, Ser 0.81; Potassium 3.6; Sodium 136    Lipid Panel Lab Results  Component Value Date   CHOL 209 (H) 03/20/2018   HDL 49.10 03/20/2018   LDLCALC 130 (H) 03/20/2018   TRIG 148.0 03/20/2018      Wt Readings from Last 3 Encounters:  07/04/18 205 lb 8 oz (93.2 kg)  06/20/18 200 lb (90.7 kg)  03/20/18 201 lb (91.2 kg)       ASSESSMENT AND PLAN:  Essential hypertension - Plan: EKG 12-Lead Blood  pressure is well controlled on today's visit. No changes made to the medications.  Tooth abscess Recommended he visit dentist ASAP  Coronary artery disease involving native coronary artery of native heart without angina pectoris - Plan: EKG 12-Lead Currently with no symptoms of angina. No further workup at this time. Continue current medication regimen.stable  Pure hypercholesterolemia  unable to tolerate statins or even zetia  discussion concerning various new medication such as repatha and praluent He did not request that we pursue these medications at this time Also discussed other medications coming out on the market in the next several years  OSA (obstructive sleep apnea) Recommended weight loss  Uncontrolled type 2 diabetes mellitus with complication, without long-term current use of insulin (HCC) Hemoglobin A1c was in  the 8 range down to 7.7 Recommended low bread, carbohydrates, weight loss No regular exercise program  S/P CABG (coronary artery bypass graft) No angina, no further testing at this time   Total encounter time more than 25 minutes  Greater than 50% was spent in counseling and coordination of care with the patient  Disposition:   F/U  12 months   Orders Placed This Encounter  Procedures  . EKG 12-Lead     Signed, Esmond Plants, M.D., Ph.D. 07/04/2018  [place], [place]  \

## 2018-07-04 ENCOUNTER — Encounter: Payer: Self-pay | Admitting: Cardiovascular Disease

## 2018-07-04 ENCOUNTER — Ambulatory Visit (INDEPENDENT_AMBULATORY_CARE_PROVIDER_SITE_OTHER): Payer: BLUE CROSS/BLUE SHIELD | Admitting: Cardiovascular Disease

## 2018-07-04 VITALS — BP 138/70 | HR 60 | Ht 65.0 in | Wt 205.5 lb

## 2018-07-04 DIAGNOSIS — R600 Localized edema: Secondary | ICD-10-CM | POA: Diagnosis not present

## 2018-07-04 DIAGNOSIS — IMO0002 Reserved for concepts with insufficient information to code with codable children: Secondary | ICD-10-CM

## 2018-07-04 DIAGNOSIS — R0602 Shortness of breath: Secondary | ICD-10-CM | POA: Diagnosis not present

## 2018-07-04 DIAGNOSIS — E1165 Type 2 diabetes mellitus with hyperglycemia: Secondary | ICD-10-CM | POA: Diagnosis not present

## 2018-07-04 DIAGNOSIS — Z951 Presence of aortocoronary bypass graft: Secondary | ICD-10-CM

## 2018-07-04 DIAGNOSIS — I2510 Atherosclerotic heart disease of native coronary artery without angina pectoris: Secondary | ICD-10-CM

## 2018-07-04 DIAGNOSIS — E118 Type 2 diabetes mellitus with unspecified complications: Secondary | ICD-10-CM

## 2018-07-04 DIAGNOSIS — I10 Essential (primary) hypertension: Secondary | ICD-10-CM

## 2018-07-04 DIAGNOSIS — E782 Mixed hyperlipidemia: Secondary | ICD-10-CM

## 2018-07-04 DIAGNOSIS — E119 Type 2 diabetes mellitus without complications: Secondary | ICD-10-CM | POA: Insufficient documentation

## 2018-07-04 DIAGNOSIS — I25118 Atherosclerotic heart disease of native coronary artery with other forms of angina pectoris: Secondary | ICD-10-CM | POA: Diagnosis not present

## 2018-07-04 MED ORDER — FUROSEMIDE 40 MG PO TABS: 40.0000 mg | ORAL_TABLET | Freq: Every day | ORAL | 3 refills | Status: DC | PRN

## 2018-07-04 NOTE — Patient Instructions (Signed)
Medication Instructions:   No medication changes made  Read about REPATHA or PRALUENT  Try cetirazine, generic for zyrtec   Labwork:  No new labs needed  Testing/Procedures:  No further testing at this time   Follow-Up: It was a pleasure seeing you in the office today. Please call us if you have new issues that need to be addressed before your next appt.  [phone]  Your physician wants you to follow-up in: 12 months.  You will receive a reminder letter in the mail two months in advance. If you don't receive a letter, please call our office to schedule the follow-up appointment.  If you need a refill on your cardiac medications before your next appointment, please call your pharmacy.  For educational health videos Log in to : [email or web address] Or : [email or web address], password : [email or web address]

## 2018-08-14 ENCOUNTER — Other Ambulatory Visit: Payer: Self-pay | Admitting: Internal Medicine

## 2018-08-14 NOTE — Telephone Encounter (Signed)
Name of Medication: Hydrocodone Name of Pharmacy: [facility] or Written Date and Quantity: 06-20-18 #60 Last Office Visit and Type: 3 Month F/U 07-04-18 Next Office Visit and Type: 3 Month F/U 09-25-18 Last Controlled Substance Agreement Date: 03-20-18 Last UDS: 03-20-18

## 2018-09-25 ENCOUNTER — Ambulatory Visit (INDEPENDENT_AMBULATORY_CARE_PROVIDER_SITE_OTHER): Payer: BLUE CROSS/BLUE SHIELD | Admitting: Internal Medicine

## 2018-09-25 ENCOUNTER — Encounter: Payer: Self-pay | Admitting: Internal Medicine

## 2018-09-25 VITALS — BP 122/70 | HR 70 | Temp 98.2°F | Ht 65.0 in | Wt 207.0 lb

## 2018-09-25 DIAGNOSIS — F1120 Opioid dependence, uncomplicated: Secondary | ICD-10-CM

## 2018-09-25 DIAGNOSIS — G894 Chronic pain syndrome: Secondary | ICD-10-CM

## 2018-09-25 DIAGNOSIS — I2510 Atherosclerotic heart disease of native coronary artery without angina pectoris: Secondary | ICD-10-CM

## 2018-09-25 MED ORDER — HYDROCODONE-ACETAMINOPHEN 5-325 MG PO TABS: ORAL_TABLET | ORAL | 0 refills | Status: DC

## 2018-09-25 NOTE — Assessment & Plan Note (Signed)
Discussed the contract again---needs to let me know before any other doctor gives controlled substances

## 2018-09-25 NOTE — Assessment & Plan Note (Signed)
Ongoing joint pain---multiple sites Uses the hydrocodone --but not two every day

## 2018-09-25 NOTE — Progress Notes (Signed)
Subjective:    Patient ID: [name] [name], male    DOB: 08/15/1951, 67 y.o.   MRN: [id number]  HPI Here for follow up of chronic pain Did have 2 teeth pulled--got Rx from dentist for some hydrocodone Discussed that I need to know about this Has new plate for bottom---but hasn't been able to use it ----still too sore  Ongoing hip problems--might be related to back Left--then right hip---gets "locked up" Tried chiropractor about a year ago Bounced a bit in loader---may have made it worse  Right knee stays swollen-that is the TKR side Not in a rush to have left done---ROM is not good on right  Sugars about the same  Current Outpatient Medications on File Prior to Visit  Medication Sig Dispense Refill  . furosemide (LASIX) 40 MG tablet Take 1 tablet (40 mg total) by mouth daily as needed. 90 tablet 3  . glucose blood (FREESTYLE LITE) test strip Use as instructed, patient tests once daily. Dx: 250.00 300 each 3  . HYDROcodone-acetaminophen (NORCO/VICODIN) 5-325 MG tablet TAKE 1 TABLET BY MOUTH TWICE (2) DAILY 60 tablet 0  . INVOKANA 300 MG TABS tablet TAKE 1 TABLET DAILY BEFORE BREAKFAST 90 tablet 3  . lisinopril (PRINIVIL,ZESTRIL) 10 MG tablet Take 1 tablet (10 mg total) by mouth daily. 90 tablet 0  . metFORMIN (GLUCOPHAGE) 1000 MG tablet Take 1,000 mg by mouth 2 (two) times daily with a meal.    . nitroGLYCERIN (NITROSTAT) 0.4 MG SL tablet Place 1 tablet (0.4 mg total) under the tongue every 5 (five) minutes as needed for chest pain. 25 tablet 3  . pioglitazone (ACTOS) 30 MG tablet TAKE 1 TABLET DAILY 90 tablet 3  . potassium chloride SA (K-DUR,KLOR-CON) 20 MEQ tablet Take 1 tablet (20 mEq total) by mouth 2 (two) times daily as needed (Take with lasix). 90 tablet 3  . triamcinolone cream (KENALOG) 0.1 % Apply 1 application topically 2 (two) times daily as needed. 30 g 0   No current facility-administered medications on file prior to visit.     Allergies  Allergen Reactions    . Crestor [Rosuvastatin] Other (See Comments)    Muscle aches.  . Doxazosin Mesylate Other (See Comments)    REACTION: didn't work and may have caused SOB  . Doxycycline Other (See Comments)    REACTION: unspecified  . Glimepiride Other (See Comments)    REACTION: red eyes  . Glipizide Other (See Comments)    REACTION: myalgia??  . Losartan Potassium-Hctz Other (See Comments)    REACTION: chest \\T \ leg pain  . Metoprolol Tartrate Other (See Comments)    Mood swings, memory changes  . Pravastatin Other (See Comments)    Muscle aches   . Rosiglitazone Maleate Other (See Comments)    REACTION: myalgia  . Simvastatin Other (See Comments)    REACTION: myalgias  . Tetracyclines & Related   . Carvedilol Other (See Comments)    Felt bad    Past Medical History:  Diagnosis Date  . Angina   . Arthritis   . BPH (benign prostatic hypertrophy)   . Carpal tunnel syndrome   . Coronary artery disease 11/12/11   s/p CABG x 3  . Diabetes mellitus    type 2  . GERD (gastroesophageal reflux disease)   . Hx of colonic polyps   . Hyperlipidemia   . Hypertension   . Kidney stones   . OSA (obstructive sleep apnea)    uses cpap  . Shortness of breath  Past Surgical History:  Procedure Laterality Date  . CORONARY ARTERY BYPASS GRAFT  11/12/2011   Procedure: CORONARY ARTERY BYPASS GRAFTING (CABG);  Surgeon: Grace Isaac, MD;  Location: [place];  Service: Open Heart Surgery;  Laterality: N/A;  Coronary Artery Bypass Graft times three on pump utilizing left internal mammary artery and right saphenous vein harvested endoscopically   . HERNIA REPAIR    . KNEE ARTHROPLASTY Right 11/23/2017   Procedure: COMPUTER ASSISTED TOTAL KNEE ARTHROPLASTY;  Surgeon: Dereck Leep, MD;  Location: [facility];  Service: Orthopedics;  Laterality: Right;  . KNEE SURGERY     left  . MENISCUS REPAIR  03/08   right knee    Family History  Problem Relation Age of Onset  . Coronary artery disease Father    . Prostate cancer Father   . Diabetes Neg Hx   . Hypertension Neg Hx     Social History   Socioeconomic History  . Marital status: Married    Spouse name: Not on file  . Number of children: 3  . Years of education: Not on file  . Highest education level: Not on file  Occupational History  . Occupation: Surveyor, quantity  Social Needs  . Financial resource strain: Not on file  . Food insecurity:    Worry: Not on file    Inability: Not on file  . Transportation needs:    Medical: Not on file    Non-medical: Not on file  Tobacco Use  . Smoking status: Never Smoker  . Smokeless tobacco: Never Used  Substance and Sexual Activity  . Alcohol use: No  . Drug use: No  . Sexual activity: Yes  Lifestyle  . Physical activity:    Days per week: Not on file    Minutes per session: Not on file  . Stress: Not on file  Relationships  . Social connections:    Talks on phone: Not on file    Gets together: Not on file    Attends religious service: Not on file    Active member of club or organization: Not on file    Attends meetings of clubs or organizations: Not on file    Relationship status: Not on file  . Intimate partner violence:    Fear of current or ex partner: Not on file    Emotionally abused: Not on file    Physically abused: Not on file    Forced sexual activity: Not on file  Other Topics Concern  . Not on file  Social History Narrative   No living will   Requests wife as health care POA   Would accept resuscitation   Would accept tube feedings   Review of Systems  Appetite is fine---trouble with consistency with teeth Weight is about the same Generally sleeps okay     Objective:   Physical Exam  Constitutional: No distress.  Musculoskeletal:  Moderate decreased internal rotation in both hips Right knee is swollen with apparent effusion           Assessment & Plan:

## 2018-10-13 DIAGNOSIS — M5418 Radiculopathy, sacral and sacrococcygeal region: Secondary | ICD-10-CM | POA: Diagnosis not present

## 2018-10-13 DIAGNOSIS — M9904 Segmental and somatic dysfunction of sacral region: Secondary | ICD-10-CM | POA: Diagnosis not present

## 2018-10-13 DIAGNOSIS — M5416 Radiculopathy, lumbar region: Secondary | ICD-10-CM | POA: Diagnosis not present

## 2018-10-13 DIAGNOSIS — M9903 Segmental and somatic dysfunction of lumbar region: Secondary | ICD-10-CM | POA: Diagnosis not present

## 2018-10-16 DIAGNOSIS — M5416 Radiculopathy, lumbar region: Secondary | ICD-10-CM | POA: Diagnosis not present

## 2018-10-16 DIAGNOSIS — M5418 Radiculopathy, sacral and sacrococcygeal region: Secondary | ICD-10-CM | POA: Diagnosis not present

## 2018-10-16 DIAGNOSIS — M9903 Segmental and somatic dysfunction of lumbar region: Secondary | ICD-10-CM | POA: Diagnosis not present

## 2018-10-16 DIAGNOSIS — M9904 Segmental and somatic dysfunction of sacral region: Secondary | ICD-10-CM | POA: Diagnosis not present

## 2018-10-19 DIAGNOSIS — M5416 Radiculopathy, lumbar region: Secondary | ICD-10-CM | POA: Diagnosis not present

## 2018-10-19 DIAGNOSIS — M9904 Segmental and somatic dysfunction of sacral region: Secondary | ICD-10-CM | POA: Diagnosis not present

## 2018-10-19 DIAGNOSIS — M9903 Segmental and somatic dysfunction of lumbar region: Secondary | ICD-10-CM | POA: Diagnosis not present

## 2018-10-19 DIAGNOSIS — M5418 Radiculopathy, sacral and sacrococcygeal region: Secondary | ICD-10-CM | POA: Diagnosis not present

## 2018-10-25 ENCOUNTER — Other Ambulatory Visit: Payer: Self-pay | Admitting: Cardiovascular Disease

## 2018-10-25 DIAGNOSIS — M5418 Radiculopathy, sacral and sacrococcygeal region: Secondary | ICD-10-CM | POA: Diagnosis not present

## 2018-10-25 DIAGNOSIS — M9903 Segmental and somatic dysfunction of lumbar region: Secondary | ICD-10-CM | POA: Diagnosis not present

## 2018-10-25 DIAGNOSIS — M5416 Radiculopathy, lumbar region: Secondary | ICD-10-CM | POA: Diagnosis not present

## 2018-10-25 DIAGNOSIS — M9904 Segmental and somatic dysfunction of sacral region: Secondary | ICD-10-CM | POA: Diagnosis not present

## 2018-11-07 ENCOUNTER — Other Ambulatory Visit: Payer: Self-pay | Admitting: Internal Medicine

## 2018-11-07 NOTE — Telephone Encounter (Signed)
Name of Medication: Hydrocodone Name of Pharmacy: [facility] or Written Date and Quantity: 09-25-18 #60 Last Office Visit and Type: 3 Month F/U 09-25-18 Next Office Visit and Type: 3 Month F/U 12-27-18 Last Controlled Substance Agreement Date: 03-20-18 Last UDS: 03-20-18

## 2018-11-08 DIAGNOSIS — M5418 Radiculopathy, sacral and sacrococcygeal region: Secondary | ICD-10-CM | POA: Diagnosis not present

## 2018-11-08 DIAGNOSIS — M9904 Segmental and somatic dysfunction of sacral region: Secondary | ICD-10-CM | POA: Diagnosis not present

## 2018-11-08 DIAGNOSIS — M9903 Segmental and somatic dysfunction of lumbar region: Secondary | ICD-10-CM | POA: Diagnosis not present

## 2018-11-08 DIAGNOSIS — M5416 Radiculopathy, lumbar region: Secondary | ICD-10-CM | POA: Diagnosis not present

## 2018-11-23 DIAGNOSIS — M1711 Unilateral primary osteoarthritis, right knee: Secondary | ICD-10-CM | POA: Diagnosis not present

## 2018-11-23 DIAGNOSIS — Z96651 Presence of right artificial knee joint: Secondary | ICD-10-CM | POA: Diagnosis not present

## 2018-12-05 ENCOUNTER — Other Ambulatory Visit: Payer: Self-pay | Admitting: Internal Medicine

## 2018-12-21 ENCOUNTER — Other Ambulatory Visit: Payer: Self-pay | Admitting: Internal Medicine

## 2018-12-21 NOTE — Telephone Encounter (Signed)
Name of Medication: Hydrocodone Name of Pharmacy: [facility] or Written Date and Quantity: 11-08-18 #60 Last Office Visit and Type: 3 Month F/U 09-25-18 Next Office Visit and Type: 3 Month F/U 12-27-18 Last Controlled Substance Agreement Date: 03-19-18 Last UDS: 03-19-18

## 2018-12-27 ENCOUNTER — Ambulatory Visit (INDEPENDENT_AMBULATORY_CARE_PROVIDER_SITE_OTHER): Payer: BLUE CROSS/BLUE SHIELD | Admitting: Internal Medicine

## 2018-12-27 ENCOUNTER — Encounter: Payer: Self-pay | Admitting: Internal Medicine

## 2018-12-27 VITALS — BP 140/78 | HR 68 | Temp 97.9°F | Ht 65.0 in | Wt 223.0 lb

## 2018-12-27 DIAGNOSIS — I10 Essential (primary) hypertension: Secondary | ICD-10-CM | POA: Diagnosis not present

## 2018-12-27 DIAGNOSIS — G894 Chronic pain syndrome: Secondary | ICD-10-CM

## 2018-12-27 DIAGNOSIS — E118 Type 2 diabetes mellitus with unspecified complications: Secondary | ICD-10-CM

## 2018-12-27 DIAGNOSIS — I25118 Atherosclerotic heart disease of native coronary artery with other forms of angina pectoris: Secondary | ICD-10-CM

## 2018-12-27 DIAGNOSIS — F1120 Opioid dependence, uncomplicated: Secondary | ICD-10-CM

## 2018-12-27 DIAGNOSIS — IMO0002 Reserved for concepts with insufficient information to code with codable children: Secondary | ICD-10-CM

## 2018-12-27 DIAGNOSIS — E1165 Type 2 diabetes mellitus with hyperglycemia: Secondary | ICD-10-CM

## 2018-12-27 LAB — POCT GLYCOSYLATED HEMOGLOBIN (HGB A1C): Hemoglobin A1C: 8 % — AB (ref 4.0–5.6)

## 2018-12-27 MED ORDER — CANAGLIFLOZIN 300 MG PO TABS: 300.0000 mg | ORAL_TABLET | Freq: Every day | ORAL | 3 refills | Status: DC

## 2018-12-27 NOTE — Assessment & Plan Note (Signed)
Doing okay Hydrocodone regularly

## 2018-12-27 NOTE — Progress Notes (Signed)
Subjective:    Patient ID: [name] [name], male    DOB: 1951/01/22, 68 y.o.   MRN: [id number]  HPI Here for follow up of chronic pain and diabetes  Having problems with his eyes They "break out" with cold medicine Redness around lids No conjunctival injection, etc Discussed that it is a reaction--avoid those meds (okay just antihistamine)  Has cyst on back---wife has been trying to drain it intermittently Wants it looked at  No recent chest pain Some SOB No palpitations No dizziness or syncope  Checks sugars occasionally Just replaced battery No hypoglycemia Very slight numbness in some toes Has been out of invokana for a couple of months (money)  Back pain is slightly better Chiropractor has helped--got out of line after a fall Still uses the hydrocodone regularly  Current Outpatient Medications on File Prior to Visit  Medication Sig Dispense Refill  . furosemide (LASIX) 40 MG tablet Take 1 tablet (40 mg total) by mouth daily as needed. 90 tablet 3  . glucose blood (FREESTYLE LITE) test strip Use as instructed, patient tests once daily. Dx: 250.00 300 each 3  . HYDROcodone-acetaminophen (NORCO/VICODIN) 5-325 MG tablet TAKE 1 TABLET BY MOUTH TWICE DAILY 60 tablet 0  . INVOKANA 300 MG TABS tablet TAKE 1 TABLET DAILY BEFORE BREAKFAST 90 tablet 3  . lisinopril (PRINIVIL,ZESTRIL) 10 MG tablet TAKE 1 TABLET DAILY 90 tablet 1  . metFORMIN (GLUCOPHAGE) 1000 MG tablet Take 1 tablet (1,000 mg total) by mouth 2 (two) times daily with a meal. 180 tablet 3  . nitroGLYCERIN (NITROSTAT) 0.4 MG SL tablet Place 1 tablet (0.4 mg total) under the tongue every 5 (five) minutes as needed for chest pain. 25 tablet 3  . pioglitazone (ACTOS) 30 MG tablet TAKE 1 TABLET DAILY 90 tablet 3  . potassium chloride SA (K-DUR,KLOR-CON) 20 MEQ tablet Take 1 tablet (20 mEq total) by mouth 2 (two) times daily as needed (Take with lasix). 90 tablet 3  . triamcinolone cream (KENALOG) 0.1 % Apply 1  application topically 2 (two) times daily as needed. 30 g 0   No current facility-administered medications on file prior to visit.     Allergies  Allergen Reactions  . Crestor [Rosuvastatin] Other (See Comments)    Muscle aches.  . Doxazosin Mesylate Other (See Comments)    REACTION: didn't work and may have caused SOB  . Doxycycline Other (See Comments)    REACTION: unspecified  . Glimepiride Other (See Comments)    REACTION: red eyes  . Glipizide Other (See Comments)    REACTION: myalgia??  . Losartan Potassium-Hctz Other (See Comments)    REACTION: chest \\T \ leg pain  . Metoprolol Tartrate Other (See Comments)    Mood swings, memory changes  . Pravastatin Other (See Comments)    Muscle aches   . Rosiglitazone Maleate Other (See Comments)    REACTION: myalgia  . Simvastatin Other (See Comments)    REACTION: myalgias  . Tetracyclines & Related   . Carvedilol Other (See Comments)    Felt bad    Past Medical History:  Diagnosis Date  . Angina   . Arthritis   . BPH (benign prostatic hypertrophy)   . Carpal tunnel syndrome   . Coronary artery disease 11/12/11   s/p CABG x 3  . Diabetes mellitus    type 2  . GERD (gastroesophageal reflux disease)   . Hx of colonic polyps   . Hyperlipidemia   . Hypertension   . Kidney stones   .  OSA (obstructive sleep apnea)    uses cpap  . Shortness of breath     Past Surgical History:  Procedure Laterality Date  . CORONARY ARTERY BYPASS GRAFT  11/12/2011   Procedure: CORONARY ARTERY BYPASS GRAFTING (CABG);  Surgeon: Grace Isaac, MD;  Location: [place];  Service: Open Heart Surgery;  Laterality: N/A;  Coronary Artery Bypass Graft times three on pump utilizing left internal mammary artery and right saphenous vein harvested endoscopically   . HERNIA REPAIR    . KNEE ARTHROPLASTY Right 11/23/2017   Procedure: COMPUTER ASSISTED TOTAL KNEE ARTHROPLASTY;  Surgeon: Dereck Leep, MD;  Location: [facility];  Service: Orthopedics;   Laterality: Right;  . KNEE SURGERY     left  . MENISCUS REPAIR  03/08   right knee    Family History  Problem Relation Age of Onset  . Coronary artery disease Father   . Prostate cancer Father   . Diabetes Neg Hx   . Hypertension Neg Hx     Social History   Socioeconomic History  . Marital status: Married    Spouse name: Not on file  . Number of children: 3  . Years of education: Not on file  . Highest education level: Not on file  Occupational History  . Occupation: Surveyor, quantity  Social Needs  . Financial resource strain: Not on file  . Food insecurity:    Worry: Not on file    Inability: Not on file  . Transportation needs:    Medical: Not on file    Non-medical: Not on file  Tobacco Use  . Smoking status: Never Smoker  . Smokeless tobacco: Never Used  Substance and Sexual Activity  . Alcohol use: No  . Drug use: No  . Sexual activity: Yes  Lifestyle  . Physical activity:    Days per week: Not on file    Minutes per session: Not on file  . Stress: Not on file  Relationships  . Social connections:    Talks on phone: Not on file    Gets together: Not on file    Attends religious service: Not on file    Active member of club or organization: Not on file    Attends meetings of clubs or organizations: Not on file    Relationship status: Not on file  . Intimate partner violence:    Fear of current or ex partner: Not on file    Emotionally abused: Not on file    Physically abused: Not on file    Forced sexual activity: Not on file  Other Topics Concern  . Not on file  Social History Narrative   No living will   Requests wife as health care POA   Would accept resuscitation   Would accept tube feedings   Review of Systems Weight up 15# or so Has new teeth and eating more Usually sleeps okay    Objective:   Physical Exam  Constitutional: He appears well-developed. No distress.  Neck: No thyromegaly present.  Cardiovascular: Normal rate, regular rhythm,  normal heart sounds and intact distal pulses. Exam reveals no gallop.  No murmur heard. Respiratory: Effort normal and breath sounds normal. No respiratory distress. He has no wheezes. He has no rales.  GI: Soft. There is no abdominal tenderness.  Musculoskeletal:        General: No tenderness or edema.  Lymphadenopathy:    He has no cervical adenopathy.  Skin:  Apparent small drained cyst over spine ~T12  No foot lesions  Psychiatric: He has a normal mood and affect. His behavior is normal.           Assessment & Plan:

## 2018-12-27 NOTE — Assessment & Plan Note (Signed)
Stable DOE No changes needed Hasn't needed nitro

## 2018-12-27 NOTE — Assessment & Plan Note (Signed)
BP Readings from Last 3 Encounters:  12/27/18 140/78  09/25/18 122/70  07/04/18 138/70   Reasonable control

## 2018-12-27 NOTE — Assessment & Plan Note (Signed)
Mild neuropathy Lab Results  Component Value Date   HGBA1C 8.0 (A) 12/27/2018   Control worse Restart invokana Work on fitness--needs to lose that weight he gained

## 2018-12-27 NOTE — Assessment & Plan Note (Signed)
CSRS reviewed--no concerns

## 2019-01-23 ENCOUNTER — Encounter: Payer: Self-pay | Admitting: Internal Medicine

## 2019-01-23 ENCOUNTER — Ambulatory Visit (INDEPENDENT_AMBULATORY_CARE_PROVIDER_SITE_OTHER): Payer: BLUE CROSS/BLUE SHIELD | Admitting: Internal Medicine

## 2019-01-23 VITALS — BP 128/82 | HR 76 | Temp 98.0°F | Ht 65.0 in | Wt 218.0 lb

## 2019-01-23 DIAGNOSIS — I25118 Atherosclerotic heart disease of native coronary artery with other forms of angina pectoris: Secondary | ICD-10-CM

## 2019-01-23 DIAGNOSIS — J019 Acute sinusitis, unspecified: Secondary | ICD-10-CM | POA: Diagnosis not present

## 2019-01-23 MED ORDER — AMOXICILLIN 500 MG PO TABS: 1000.0000 mg | ORAL_TABLET | Freq: Two times a day (BID) | ORAL | 0 refills | Status: AC

## 2019-01-23 NOTE — Patient Instructions (Signed)
Please start the antibiotic if you are worsening in the next few days.

## 2019-01-23 NOTE — Assessment & Plan Note (Signed)
Discussed likely viral etiology Supportive care If worsens over the next few days, start amoxil

## 2019-01-23 NOTE — Progress Notes (Signed)
Subjective:    Patient ID: [name] [name], male    DOB: 12-07-1950, 68 y.o.   MRN: [id number]  HPI Here due to respiratory illness  Started 3-4 days ago Came on like cold---scratchy throat, runny nose, congestion Now with persistent cough--worse at night Throat still bothering him CPAP is bothering it all Slight sputum  No clear fever No sweats or chills No SOB  Tried cold medicine--not much help (did help him go to work) Recurrent sinus problems for decades  Current Outpatient Medications on File Prior to Visit  Medication Sig Dispense Refill  . canagliflozin (INVOKANA) 300 MG TABS tablet Take 1 tablet (300 mg total) by mouth daily before breakfast. 90 tablet 3  . furosemide (LASIX) 40 MG tablet Take 1 tablet (40 mg total) by mouth daily as needed. 90 tablet 3  . glucose blood (FREESTYLE LITE) test strip Use as instructed, patient tests once daily. Dx: 250.00 300 each 3  . HYDROcodone-acetaminophen (NORCO/VICODIN) 5-325 MG tablet TAKE 1 TABLET BY MOUTH TWICE DAILY 60 tablet 0  . lisinopril (PRINIVIL,ZESTRIL) 10 MG tablet TAKE 1 TABLET DAILY 90 tablet 1  . metFORMIN (GLUCOPHAGE) 1000 MG tablet Take 1 tablet (1,000 mg total) by mouth 2 (two) times daily with a meal. 180 tablet 3  . nitroGLYCERIN (NITROSTAT) 0.4 MG SL tablet Place 1 tablet (0.4 mg total) under the tongue every 5 (five) minutes as needed for chest pain. 25 tablet 3  . pioglitazone (ACTOS) 30 MG tablet TAKE 1 TABLET DAILY 90 tablet 3  . potassium chloride SA (K-DUR,KLOR-CON) 20 MEQ tablet Take 1 tablet (20 mEq total) by mouth 2 (two) times daily as needed (Take with lasix). 90 tablet 3  . triamcinolone cream (KENALOG) 0.1 % Apply 1 application topically 2 (two) times daily as needed. 30 g 0   No current facility-administered medications on file prior to visit.     Allergies  Allergen Reactions  . Crestor [Rosuvastatin] Other (See Comments)    Muscle aches.  . Doxazosin Mesylate Other (See Comments)   REACTION: didn't work and may have caused SOB  . Doxycycline Other (See Comments)    REACTION: unspecified  . Glimepiride Other (See Comments)    REACTION: red eyes  . Glipizide Other (See Comments)    REACTION: myalgia??  . Losartan Potassium-Hctz Other (See Comments)    REACTION: chest \\T \ leg pain  . Metoprolol Tartrate Other (See Comments)    Mood swings, memory changes  . Pravastatin Other (See Comments)    Muscle aches   . Rosiglitazone Maleate Other (See Comments)    REACTION: myalgia  . Simvastatin Other (See Comments)    REACTION: myalgias  . Tetracyclines & Related   . Carvedilol Other (See Comments)    Felt bad    Past Medical History:  Diagnosis Date  . Angina   . Arthritis   . BPH (benign prostatic hypertrophy)   . Carpal tunnel syndrome   . Coronary artery disease 11/12/11   s/p CABG x 3  . Diabetes mellitus    type 2  . GERD (gastroesophageal reflux disease)   . Hx of colonic polyps   . Hyperlipidemia   . Hypertension   . Kidney stones   . OSA (obstructive sleep apnea)    uses cpap  . Shortness of breath     Past Surgical History:  Procedure Laterality Date  . CORONARY ARTERY BYPASS GRAFT  11/12/2011   Procedure: CORONARY ARTERY BYPASS GRAFTING (CABG);  Surgeon: Grace Isaac,  MD;  Location: [facility];  Service: Open Heart Surgery;  Laterality: N/A;  Coronary Artery Bypass Graft times three on pump utilizing left internal mammary artery and right saphenous vein harvested endoscopically   . HERNIA REPAIR    . KNEE ARTHROPLASTY Right 11/23/2017   Procedure: COMPUTER ASSISTED TOTAL KNEE ARTHROPLASTY;  Surgeon: Dereck Leep, MD;  Location: [facility] ORS;  Service: Orthopedics;  Laterality: Right;  . KNEE SURGERY     left  . MENISCUS REPAIR  03/08   right knee    Family History  Problem Relation Age of Onset  . Coronary artery disease Father   . Prostate cancer Father   . Diabetes Neg Hx   . Hypertension Neg Hx     Social History   Socioeconomic  History  . Marital status: Married    Spouse name: Not on file  . Number of children: 3  . Years of education: Not on file  . Highest education level: Not on file  Occupational History  . Occupation: Surveyor, quantity  Social Needs  . Financial resource strain: Not on file  . Food insecurity:    Worry: Not on file    Inability: Not on file  . Transportation needs:    Medical: Not on file    Non-medical: Not on file  Tobacco Use  . Smoking status: Never Smoker  . Smokeless tobacco: Never Used  Substance and Sexual Activity  . Alcohol use: No  . Drug use: No  . Sexual activity: Yes  Lifestyle  . Physical activity:    Days per week: Not on file    Minutes per session: Not on file  . Stress: Not on file  Relationships  . Social connections:    Talks on phone: Not on file    Gets together: Not on file    Attends religious service: Not on file    Active member of club or organization: Not on file    Attends meetings of clubs or organizations: Not on file    Relationship status: Not on file  . Intimate partner violence:    Fear of current or ex partner: Not on file    Emotionally abused: Not on file    Physically abused: Not on file    Forced sexual activity: Not on file  Other Topics Concern  . Not on file  Social History Narrative   No living will   Requests wife as health care POA   Would accept resuscitation   Would accept tube feedings   Review of Systems  No rash No vomiting or diarrhea Appetite is okay     Objective:   Physical Exam  Constitutional: He appears well-developed. No distress.  HENT:  No sinus tenderness TMs normal Moderate nasal inflammation Prominent uvula but not injected  Neck: No thyromegaly present.  Respiratory: Effort normal and breath sounds normal. No respiratory distress. He has no wheezes. He has no rales.  Lymphadenopathy:    He has no cervical adenopathy.           Assessment & Plan:

## 2019-02-09 ENCOUNTER — Other Ambulatory Visit: Payer: Self-pay | Admitting: Internal Medicine

## 2019-02-09 NOTE — Telephone Encounter (Signed)
Name of Medication: Hydrocodone Name of Pharmacy: [facility] or Written Date and Quantity: 12-21-18 #60 Last Office Visit and Type: 3 Month F/U 12-27-18 Next Office Visit and Type: 3 Month F/U 04-13-19 Last Controlled Substance Agreement Date:  03-20-18 Last UDS: 03-20-18

## 2019-02-22 ENCOUNTER — Ambulatory Visit: Payer: Medicare Other | Admitting: Internal Medicine

## 2019-03-21 ENCOUNTER — Other Ambulatory Visit: Payer: Self-pay | Admitting: Internal Medicine

## 2019-03-22 NOTE — Telephone Encounter (Signed)
Name of Medication: Hydrocodone Name of Pharmacy: [facility] or Written Date and Quantity: 02-09-19 #60 Last Office Visit and Type: 3 Month F/U 12-27-18 Next Office Visit and Type: 3 Month F/U 04-13-19 Last Controlled Substance Agreement Date:  03-20-18 Last UDS: 03-20-18

## 2019-04-04 ENCOUNTER — Telehealth: Payer: Self-pay | Admitting: Internal Medicine

## 2019-04-04 DIAGNOSIS — IMO0002 Reserved for concepts with insufficient information to code with codable children: Secondary | ICD-10-CM

## 2019-04-04 DIAGNOSIS — E1165 Type 2 diabetes mellitus with hyperglycemia: Secondary | ICD-10-CM

## 2019-04-04 DIAGNOSIS — E118 Type 2 diabetes mellitus with unspecified complications: Principal | ICD-10-CM

## 2019-04-04 NOTE — Telephone Encounter (Signed)
Yes--I put in orders for the labs and those can be done before the visit

## 2019-04-04 NOTE — Telephone Encounter (Signed)
Patient has appointment for a cpx on 04/13/19.  I'm going to call patient to try and change appointment to [email or web address].  Do you want patient to have lab work done before his appointment?

## 2019-04-04 NOTE — Telephone Encounter (Signed)
I spoke to patient and he didn't want to do a virtual visit.  Patient rescheduled appointment to 09/05/19.

## 2019-04-05 NOTE — Telephone Encounter (Signed)
That is okay.

## 2019-04-13 ENCOUNTER — Encounter: Payer: BLUE CROSS/BLUE SHIELD | Admitting: Internal Medicine

## 2019-05-07 ENCOUNTER — Telehealth: Payer: Self-pay | Admitting: Internal Medicine

## 2019-05-07 NOTE — Telephone Encounter (Signed)
I spoke to patient and he scheduled an appointment for a phone visit on 05/10/19 at 4:00.

## 2019-05-07 NOTE — Telephone Encounter (Signed)
Needs virtual visit this month to review narcotics

## 2019-05-07 NOTE — Telephone Encounter (Signed)
Name of Medication: Hydrocodone Name of Pharmacy: [place] [place] or Written Date and Quantity: 03-20-18 #60 Last Office Visit and Type: 01-23-19 Next Office Visit and Type: 09-05-19 Last Controlled Substance Agreement Date: 03-20-18 Last UDS: 03-20-18

## 2019-05-10 ENCOUNTER — Encounter: Payer: Self-pay | Admitting: Internal Medicine

## 2019-05-10 ENCOUNTER — Ambulatory Visit (INDEPENDENT_AMBULATORY_CARE_PROVIDER_SITE_OTHER): Payer: BLUE CROSS/BLUE SHIELD | Admitting: Internal Medicine

## 2019-05-10 DIAGNOSIS — F1120 Opioid dependence, uncomplicated: Secondary | ICD-10-CM | POA: Diagnosis not present

## 2019-05-10 DIAGNOSIS — E118 Type 2 diabetes mellitus with unspecified complications: Secondary | ICD-10-CM

## 2019-05-10 DIAGNOSIS — I25118 Atherosclerotic heart disease of native coronary artery with other forms of angina pectoris: Secondary | ICD-10-CM

## 2019-05-10 DIAGNOSIS — G894 Chronic pain syndrome: Secondary | ICD-10-CM | POA: Diagnosis not present

## 2019-05-10 NOTE — Progress Notes (Signed)
Subjective:    Patient ID: [name] [name], male    DOB: Nov 20, 1951, 68 y.o.   MRN: [id number]  HPI Visit for review of chronic pain and narcotic dependence  Interactive audio and video telecommunications were attempted between this provider and patient, however failed, due to patient having technical difficulties OR patient did not have access to video capability.  We continued and completed visit with audio only.   Virtual Visit via Telephone Note  I connected with [name] [name] on 05/10/19 at  4:00 PM EDT by telephone and verified that I am speaking with the correct person using two identifiers.  Location: Patient: home  Provider: office   I discussed the limitations, risks, security and privacy concerns of performing an evaluation and management service by telephone and the availability of in person appointments. I also discussed with the patient that there may be a patient responsible charge related to this service. The patient expressed understanding and agreed to proceed.   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 canagliflozin (INVOKANA) 300 MG TABS tablet Take 1 tablet (300 mg total) by mouth daily before breakfast. 90 tablet 3  . furosemide (LASIX) 40 MG tablet Take 1 tablet (40 mg total) by mouth daily as needed. 90 tablet 3  . glucose blood (FREESTYLE LITE) test strip Use as instructed, patient tests once daily. Dx: 250.00 300 each 3  . HYDROcodone-acetaminophen (NORCO/VICODIN) 5-325 MG tablet TAKE ONE TABLET BY MOUTH TWICE DAILY 60 tablet 0  . lisinopril (PRINIVIL,ZESTRIL) 10 MG  tablet TAKE 1 TABLET DAILY 90 tablet 1  . metFORMIN (GLUCOPHAGE) 1000 MG tablet Take 1 tablet (1,000 mg total) by mouth 2 (two) times daily with a meal. 180 tablet 3  . nitroGLYCERIN (NITROSTAT) 0.4 MG SL tablet Place 1 tablet (0.4 mg total) under the tongue every 5 (five) minutes as needed for chest pain. 25 tablet 3  . pioglitazone (ACTOS) 30 MG tablet TAKE 1 TABLET DAILY 90 tablet 3  . potassium chloride SA (K-DUR,KLOR-CON) 20 MEQ tablet Take 1 tablet (20 mEq total) by mouth 2 (two) times daily as needed (Take with lasix). 90 tablet 3  . triamcinolone cream (KENALOG) 0.1 % Apply 1 application topically 2 (two) times daily as needed. 30 g 0   No current facility-administered medications on file prior to visit.     Allergies  Allergen Reactions  . Crestor [Rosuvastatin] Other (See Comments)    Muscle aches.  . Doxazosin Mesylate Other (See Comments)    REACTION: didn't work and may have caused SOB  . Doxycycline Other (See Comments)    REACTION: unspecified  . Glimepiride Other (See Comments)    REACTION: red eyes  . Glipizide Other (See Comments)    REACTION: myalgia??  . Losartan Potassium-Hctz Other (See Comments)    REACTION: chest \\T \ leg pain  . Metoprolol Tartrate Other (See Comments)    Mood swings, memory changes  . Pravastatin Other (See Comments)    Muscle aches   . Rosiglitazone Maleate Other (See Comments)    REACTION: myalgia  . Simvastatin Other (See  Comments)    REACTION: myalgias  . Tetracyclines & Related   . Carvedilol Other (See Comments)    Felt bad    Past Medical History:  Diagnosis Date  . Angina   . Arthritis   . BPH (benign prostatic hypertrophy)   . Carpal tunnel syndrome   . Coronary artery disease 11/12/11   s/p CABG x 3  . Diabetes mellitus    type 2  . GERD (gastroesophageal reflux disease)   . Hx of colonic polyps   . Hyperlipidemia   . Hypertension   . Kidney stones   . OSA (obstructive sleep apnea)    uses cpap  . Shortness of  breath     Past Surgical History:  Procedure Laterality Date  . CORONARY ARTERY BYPASS GRAFT  11/12/2011   Procedure: CORONARY ARTERY BYPASS GRAFTING (CABG);  Surgeon: Grace Isaac, MD;  Location: [facility];  Service: Open Heart Surgery;  Laterality: N/A;  Coronary Artery Bypass Graft times three on pump utilizing left internal mammary artery and right saphenous vein harvested endoscopically   . HERNIA REPAIR    . KNEE ARTHROPLASTY Right 11/23/2017   Procedure: COMPUTER ASSISTED TOTAL KNEE ARTHROPLASTY;  Surgeon: Dereck Leep, MD;  Location: [facility] OR[facility];  Service: Orthopedics;  Laterality: Right;  . KNEE SURGERY     left  . MENISCUS REPAIR  03/08   right knee    Family History  Problem Relation Age of Onset  . Coronary artery disease Father   . Prostate cancer Father   . Diabetes Neg Hx   . Hypertension Neg Hx     Social History   Socioeconomic History  . Marital status: Married    Spouse name: Not on file  . Number of children: 3  . Years of education: Not on file  . Highest education level: Not on file  Occupational History  . Occupation: Surveyor, quantity  Social Needs  . Financial resource strain: Not on file  . Food insecurity:    Worry: Not on file    Inability: Not on file  . Transportation needs:    Medical: Not on file    Non-medical: Not on file  Tobacco Use  . Smoking status: Never Smoker  . Smokeless tobacco: Never Used  Substance and Sexual Activity  . Alcohol use: No  . Drug use: No  . Sexual activity: Yes  Lifestyle  . Physical activity:    Days per week: Not on file    Minutes per session: Not on file  . Stress: Not on file  Relationships  . Social connections:    Talks on phone: Not on file    Gets together: Not on file    Attends religious service: Not on file    Active member of club or organization: Not on file    Attends meetings of clubs or organizations: Not on file    Relationship status: Not on file  . Intimate partner violence:    Fear  of current or ex partner: Not on file    Emotionally abused: Not on file    Physically abused: Not on file    Forced sexual activity: Not on file  Other Topics Concern  . Not on file  Social History Narrative   No living will   Requests wife as health care POA   Would accept resuscitation   Would accept tube feedings   ROS Sleeps okay Appetite is generally okay Checks sugars regularly----- usually 150 fasting  Observations/Objective: Normal conversation No trouble breathing Mood is fine  Assessment and Plan:   Follow Up Instructions:    I discussed the assessment and treatment plan with the patient. The patient was provided an opportunity to ask questions and all were answered. The patient agreed with the plan and demonstrated an understanding of the instructions.   The patient was advised to call back or seek an in-person evaluation if the symptoms worsen or if the condition fails to improve as anticipated.  I provided 11 minutes of non-face-to-face time during this encounter.   Viviana Simpler, MD    Review of Systems     Objective:   Physical Exam         Assessment & Plan:

## 2019-05-10 NOTE — Assessment & Plan Note (Signed)
PDMP reviewed No concerns 

## 2019-05-10 NOTE — Assessment & Plan Note (Signed)
Seems to be okay Will check labs at next visit

## 2019-05-10 NOTE — Assessment & Plan Note (Signed)
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

## 2019-06-13 ENCOUNTER — Other Ambulatory Visit: Payer: Self-pay | Admitting: Internal Medicine

## 2019-06-13 NOTE — Telephone Encounter (Signed)
Name of Medication: Hydrocodone Name of Pharmacy: [facility] or Written Date and Quantity: 05-07-19 #60 Last Office Visit and Type: 05-10-19 Next Office Visit and Type: 09-05-19 Last Controlled Substance Agreement Date: 03-20-18  Last UDS: 793-96

## 2019-07-23 ENCOUNTER — Other Ambulatory Visit: Payer: Self-pay | Admitting: Internal Medicine

## 2019-07-23 NOTE — Telephone Encounter (Signed)
Name of Medication: Hydrocodone Name of Pharmacy: [facility] or Written Date and Quantity: 06-13-19 #60 Last Office Visit and Type: 05-10-19 Next Office Visit and Type: 09-05-19 Last Controlled Substance Agreement Date: 03-20-18  Last UDS: 03-20-18

## 2019-08-03 ENCOUNTER — Other Ambulatory Visit: Payer: Self-pay | Admitting: Cardiovascular Disease

## 2019-08-15 NOTE — Progress Notes (Signed)
Cardiology Office Note    Date:  08/20/2019   ID:  [name] [name], DOB Aug 16, 1951, MRN [id number]  PCP:  Venia Carbon, MD  Cardiologist:  Ida Rogue, MD  Electrophysiologist:  None   Chief Complaint: Follow up  History of Present Illness:   [name] [name] is a 68 y.o. male with history of CAD status post three-vessel CABG in 11/2011 with LIMA to LAD, reversed SVG sequentially to D1 and D3, diabetes, hypertension, hyperlipidemia with statin and Zetia intolerance, obesity, nephrolithiasis, carpal tunnel syndrome, chronic pain syndrome, BPH, arthritis, and OSA who presents for follow-up of his CAD.  Patient has not required ischemic evaluation since his bypass surgery in 2012.  Pre-bypass carotid artery ultrasound showed no significant extracranial carotid artery stenosis with antegrade flow of the bilateral vertebral arteries.  Most recent echo from 2013 showed an EF of 55 to 60%, mild concentric LVH, no regional wall motion abnormalities, normal LV diastolic function, mildly dilated left atrium, RV systolic function normal, PASP normal.  He was most recently seen in the office in 06/2018 for routine follow-up and denied any symptoms concerning for angina.  He did not want rechallenge of statin or Zetia.  He was requesting antibiotics for possible dental infection.  Patient comes in doing well from a cardiac perspective.  His only concern at this time is a several week history of increased bilateral leg fatigue/heaviness/cramping that typically occurs with ambulation.  He denies any nonhealing wounds or ulcers on the bilateral lower extremities.  He denies any chest pain, shortness of breath, palpitations, dizziness, presyncope, or syncope.  He does have intermittent lower extremity swelling which resolves with as needed Lasix.  He tries to take Lasix exclusively on the weekends as it is easier for him to get to the restroom at that time.  He has not needed any Lasix recently.   He does not check his blood pressure at home.  He is taking aspirin 162 mg daily though this is not on his medication list.  No falls since he was last seen.  No BRBPR or melena.  He will be seeing his PCP in several weeks time for a CPE and will have blood work at that visit.  In this setting, he defers blood work being checked today.   Labs: 12/2018 - A1c 8.0 03/2018 - TC 209, TG 148, HDL 49, LDL 130 11/2017 - K+ 3.6, SCr 0.81, HGB 15.9, PLT 229   Past Medical History:  Diagnosis Date   Angina    Arthritis    BPH (benign prostatic hypertrophy)    Carpal tunnel syndrome    Coronary artery disease 11/12/11   s/p CABG x 3   Diabetes mellitus    type 2   GERD (gastroesophageal reflux disease)    Hx of colonic polyps    Hyperlipidemia    Hypertension    Kidney stones    OSA (obstructive sleep apnea)    uses cpap   Shortness of breath     Past Surgical History:  Procedure Laterality Date   CORONARY ARTERY BYPASS GRAFT  11/12/2011   Procedure: CORONARY ARTERY BYPASS GRAFTING (CABG);  Surgeon: Grace Isaac, MD;  Location: [facility];  Service: Open Heart Surgery;  Laterality: N/A;  Coronary Artery Bypass Graft times three on pump utilizing left internal mammary artery and right saphenous vein harvested endoscopically    HERNIA REPAIR     KNEE ARTHROPLASTY Right 11/23/2017   Procedure: COMPUTER ASSISTED TOTAL KNEE ARTHROPLASTY;  Surgeon: Dereck Leep, MD;  Location: [facility];  Service: Orthopedics;  Laterality: Right;   KNEE SURGERY     left   MENISCUS REPAIR  03/08   right knee    Current Medications: Current Meds  Medication Sig   canagliflozin (INVOKANA) 300 MG TABS tablet Take 1 tablet (300 mg total) by mouth daily before breakfast.   furosemide (LASIX) 40 MG tablet Take 1 tablet (40 mg total) by mouth daily as needed.   glucose blood (FREESTYLE LITE) test strip Use as instructed, patient tests once daily. Dx: 250.00   HYDROcodone-acetaminophen  (NORCO/VICODIN) 5-325 MG tablet TAKE 1 TABLET BY MOUTH TWICE (2) DAILY   lisinopril (ZESTRIL) 10 MG tablet TAKE 1 TABLET DAILY   metFORMIN (GLUCOPHAGE) 1000 MG tablet Take 1 tablet (1,000 mg total) by mouth 2 (two) times daily with a meal.   nitroGLYCERIN (NITROSTAT) 0.4 MG SL tablet Place 1 tablet (0.4 mg total) under the tongue every 5 (five) minutes as needed for chest pain.   pioglitazone (ACTOS) 30 MG tablet TAKE 1 TABLET DAILY   potassium chloride SA (K-DUR,KLOR-CON) 20 MEQ tablet Take 1 tablet (20 mEq total) by mouth 2 (two) times daily as needed (Take with lasix).   triamcinolone cream (KENALOG) 0.1 % Apply 1 application topically 2 (two) times daily as needed.    Allergies:   Crestor [rosuvastatin], Doxazosin mesylate, Doxycycline, Glimepiride, Glipizide, Losartan potassium-hctz, Metoprolol tartrate, Pravastatin, Rosiglitazone maleate, Simvastatin, Tetracyclines & related, and Carvedilol   Social History   Socioeconomic History   Marital status: Married    Spouse name: Not on file   Number of children: 3   Years of education: Not on file   Highest education level: Not on file  Occupational History   Occupation: Surveyor, quantity  Social Needs   Financial resource strain: Not on file   Food insecurity    Worry: Not on file    Inability: Not on file   Transportation needs    Medical: Not on file    Non-medical: Not on file  Tobacco Use   Smoking status: Never Smoker   Smokeless tobacco: Never Used  Substance and Sexual Activity   Alcohol use: No   Drug use: No   Sexual activity: Yes  Lifestyle   Physical activity    Days per week: Not on file    Minutes per session: Not on file   Stress: Not on file  Relationships   Social connections    Talks on phone: Not on file    Gets together: Not on file    Attends religious service: Not on file    Active member of club or organization: Not on file    Attends meetings of clubs or organizations: Not on file      Relationship status: Not on file  Other Topics Concern   Not on file  Social History Narrative   No living will   Requests wife as health care POA   Would accept resuscitation   Would accept tube feedings     Family History:  The patient's family history includes Coronary artery disease in his father; Prostate cancer in his father. There is no history of Diabetes or Hypertension.  ROS:   Review of Systems  Constitutional: Negative for chills, diaphoresis, fever, malaise/fatigue and weight loss.  HENT: Negative for congestion.   Eyes: Negative for discharge and redness.  Respiratory: Negative for cough, hemoptysis, sputum production, shortness of breath and wheezing.   Cardiovascular: Positive for claudication.  Negative for chest pain, palpitations, orthopnea, leg swelling and PND.  Gastrointestinal: Negative for abdominal pain, blood in stool, heartburn, melena, nausea and vomiting.  Genitourinary: Negative for hematuria.  Musculoskeletal: Negative for falls and myalgias.  Skin: Negative for rash.  Neurological: Negative for dizziness, tingling, tremors, sensory change, speech change, focal weakness, loss of consciousness and weakness.  Endo/Heme/Allergies: Bruises/bleeds easily.  Psychiatric/Behavioral: Negative for substance abuse. The patient is not nervous/anxious.   All other systems reviewed and are negative.    EKGs/Labs/Other Studies Reviewed:    Studies reviewed were summarized above. The additional studies were reviewed today: As above  EKG:  EKG is ordered today.  The EKG ordered today demonstrates NSR, 69 bpm, first-degree AV block, nonspecific IVCD, nonspecific ST-T changes (grossly unchanged from prior)  ReDs Vest: 26  Recent Labs: No results found for requested labs within last 8760 hours.  Recent Lipid Panel    Component Value Date/Time   CHOL 209 (H) 03/20/2018 1146   TRIG 148.0 03/20/2018 1146   HDL 49.10 03/20/2018 1146   CHOLHDL 4 03/20/2018  1146   VLDL 29.6 03/20/2018 1146   LDLCALC 130 (H) 03/20/2018 1146   LDLDIRECT 73.0 03/08/2016 1133    PHYSICAL EXAM:    VS:  BP (!) 158/74 (BP Location: Left Arm, Patient Position: Sitting, Cuff Size: Normal)    Pulse 69    Temp 97.9 F (36.6 C)    Ht 5\' 4"  (1.626 m)    Wt 212 lb (96.2 kg)    SpO2 97%    BMI 36.39 kg/m   BMI: Body mass index is 36.39 kg/m.  Physical Exam  Constitutional: He is oriented to person, place, and time. He appears well-developed and well-nourished.  HENT:  Head: Normocephalic and atraumatic.  Eyes: Right eye exhibits no discharge. Left eye exhibits no discharge.  Neck: Normal range of motion. No JVD present.  Cardiovascular: Normal rate, regular rhythm, S1 normal, S2 normal and normal heart sounds. Exam reveals no distant heart sounds, no friction rub, no midsystolic click and no opening snap.  No murmur heard. Pulses:      Posterior tibial pulses are 1+ on the right side and 2+ on the left side.  Pulmonary/Chest: Effort normal and breath sounds normal. No respiratory distress. He has no decreased breath sounds. He has no wheezes. He has no rales. He exhibits no tenderness.  Abdominal: Soft. He exhibits no distension. There is no abdominal tenderness.  Musculoskeletal:        General: No edema.  Neurological: He is alert and oriented to person, place, and time.  Skin: Skin is warm and dry. No cyanosis. Nails show no clubbing.  Multiple excoriations noted along the bilateral upper and lower extremities without evidence of secondary infection  Psychiatric: He has a normal mood and affect. His speech is normal and behavior is normal. Judgment and thought content normal.    Wt Readings from Last 3 Encounters:  08/20/19 212 lb (96.2 kg)  01/23/19 218 lb (98.9 kg)  12/27/18 223 lb (101.2 kg)     ASSESSMENT & PLAN:   1. CAD involving the native coronary arteries status post CABG without angina: He is doing well without any symptoms concerning for angina.   He has been taking aspirin 162 mg daily since his bypass.  I did ask that he decrease this down to 81 mg daily and have added the medication to his medication list.  Intolerant to beta-blocker.  Intolerant to statins and Zetia and does not  want to rechallenge these medications.  I have again discussed PCSK9 inhibitors with the patient and he would like to think on these.  He defers blood work being drawn today indicating he will be having a physical with his PCP in a couple of weeks and will have blood work drawn at that time.  I have asked that this be forwarded to either myself or Dr. Rockey Situ for review and further discussion of possible PCSK9 inhibitor or bempedoic acid.   2. Lower extremity claudication: Schedule lower extremity ABIs.  Would recommend addition of statin therapy as outlined above.  Continue aspirin.  3. Hypertension: Blood pressure is elevated today.  Increase lisinopril to 20 mg daily.  He will have follow-up labs with his PCP in a couple of weeks as they were deferred by the patient at our visit today.  4. Hyperlipidemia: Most recent LDL of 130 from 03/2018 with a goal LDL less than 70.  He is intolerant to statin therapy and Zetia.  We again discussed PCSK9 inhibitor as outlined above.  Lipid panel will be drawn by PCP and forwarded to cardiology for further discussion of PCSK9 inhibitor or bempedoic acid.   Disposition: F/u with Dr. Rockey Situ or an APP in 6 months.   Medication Adjustments/Labs and Tests Ordered: Current medicines are reviewed at length with the patient today.  Concerns regarding medicines are outlined above. Medication changes, Labs and Tests ordered today are summarized above and listed in the Patient Instructions accessible in Encounters.   Signed, Christell Faith, PA-C 08/20/2019 10:26 AM     [address] [place], [place] [id number] [phone]

## 2019-08-20 ENCOUNTER — Ambulatory Visit (INDEPENDENT_AMBULATORY_CARE_PROVIDER_SITE_OTHER): Payer: BC Managed Care – PPO | Admitting: Physician Assistant

## 2019-08-20 ENCOUNTER — Other Ambulatory Visit: Payer: Self-pay

## 2019-08-20 ENCOUNTER — Encounter: Payer: Self-pay | Admitting: Physician Assistant

## 2019-08-20 VITALS — BP 158/74 | HR 69 | Temp 97.9°F | Ht 64.0 in | Wt 212.0 lb

## 2019-08-20 DIAGNOSIS — I2510 Atherosclerotic heart disease of native coronary artery without angina pectoris: Secondary | ICD-10-CM | POA: Diagnosis not present

## 2019-08-20 DIAGNOSIS — Z951 Presence of aortocoronary bypass graft: Secondary | ICD-10-CM | POA: Diagnosis not present

## 2019-08-20 DIAGNOSIS — I739 Peripheral vascular disease, unspecified: Secondary | ICD-10-CM | POA: Diagnosis not present

## 2019-08-20 DIAGNOSIS — E785 Hyperlipidemia, unspecified: Secondary | ICD-10-CM | POA: Diagnosis not present

## 2019-08-20 DIAGNOSIS — I10 Essential (primary) hypertension: Secondary | ICD-10-CM

## 2019-08-20 MED ORDER — ASPIRIN EC 81 MG PO TBEC: 81.0000 mg | DELAYED_RELEASE_TABLET | Freq: Every day | ORAL | 3 refills | Status: AC

## 2019-08-20 MED ORDER — LISINOPRIL 20 MG PO TABS: 20.0000 mg | ORAL_TABLET | Freq: Every day | ORAL | 3 refills | Status: DC

## 2019-08-20 NOTE — Patient Instructions (Signed)
Medication Instructions:  1- DECREASE Aspirin to 1 tablet (81 mg total) once daily 2- INCREASE Lisinopril to 1 tablet (20 mg total) once daily.  If you need a refill on your cardiac medications before your next appointment, please call your pharmacy.   Lab work: None ordered  If you have labs (blood work) drawn today and your tests are completely normal, you will receive your results only by: Marland Kitchen MyChart Message (if you have MyChart) OR . A paper copy in the mail If you have any lab test that is abnormal or we need to change your treatment, we will call you to review the results.  Testing/Procedures: 1- Lower Extremity Ultrasound Your physician has requested that you have a lower extremity arterial exercise duplex. During this test, exercise and ultrasound are used to evaluate arterial blood flow in the legs. Allow one hour for this exam. There are no restrictions or special instructions.  Follow-Up: At [facility], you and your health needs are our priority.  As part of our continuing mission to provide you with exceptional heart care, we have created designated Provider Care Teams.  These Care Teams include your primary Cardiologist (physician) and Advanced Practice Providers (APPs -  Physician Assistants and Nurse Practitioners) who all work together to provide you with the care you need, when you need it. You will need a follow up appointment in 6 months.  Please call our office 2 months in advance to schedule this appointment.  You may see Ida Rogue, MD or Christell Faith, PA-C.

## 2019-09-03 ENCOUNTER — Other Ambulatory Visit: Payer: Self-pay | Admitting: Internal Medicine

## 2019-09-03 NOTE — Telephone Encounter (Signed)
Name of Medication: Hydrocodone Name of Pharmacy: [facility] or Written Date and Quantity: 07-23-19 #60 Last Office Visit and Type: 05-10-19 (virtual) Next Office Visit and Type: 09-05-19 Last Controlled Substance Agreement Date: 03-20-18 Last UDS: 03-20-18

## 2019-09-05 ENCOUNTER — Encounter: Payer: Self-pay | Admitting: Internal Medicine

## 2019-09-05 ENCOUNTER — Ambulatory Visit (INDEPENDENT_AMBULATORY_CARE_PROVIDER_SITE_OTHER)
Admission: RE | Admit: 2019-09-05 | Discharge: 2019-09-05 | Disposition: A | Discharge status: Home / Self Care | Payer: BC Managed Care – PPO | Source: Ambulatory Visit | Attending: Internal Medicine | Admitting: Internal Medicine

## 2019-09-05 ENCOUNTER — Telehealth: Payer: Self-pay

## 2019-09-05 ENCOUNTER — Ambulatory Visit (INDEPENDENT_AMBULATORY_CARE_PROVIDER_SITE_OTHER): Payer: BC Managed Care – PPO | Admitting: Internal Medicine

## 2019-09-05 ENCOUNTER — Other Ambulatory Visit: Payer: Self-pay

## 2019-09-05 VITALS — BP 140/84 | HR 75 | Temp 98.3°F | Ht 64.5 in | Wt 213.0 lb

## 2019-09-05 DIAGNOSIS — N138 Other obstructive and reflux uropathy: Secondary | ICD-10-CM

## 2019-09-05 DIAGNOSIS — IMO0002 Reserved for concepts with insufficient information to code with codable children: Secondary | ICD-10-CM

## 2019-09-05 DIAGNOSIS — Z125 Encounter for screening for malignant neoplasm of prostate: Secondary | ICD-10-CM | POA: Diagnosis not present

## 2019-09-05 DIAGNOSIS — E118 Type 2 diabetes mellitus with unspecified complications: Secondary | ICD-10-CM

## 2019-09-05 DIAGNOSIS — F1120 Opioid dependence, uncomplicated: Secondary | ICD-10-CM

## 2019-09-05 DIAGNOSIS — E1165 Type 2 diabetes mellitus with hyperglycemia: Secondary | ICD-10-CM | POA: Diagnosis not present

## 2019-09-05 DIAGNOSIS — R0602 Shortness of breath: Secondary | ICD-10-CM | POA: Diagnosis not present

## 2019-09-05 DIAGNOSIS — I25118 Atherosclerotic heart disease of native coronary artery with other forms of angina pectoris: Secondary | ICD-10-CM | POA: Diagnosis not present

## 2019-09-05 DIAGNOSIS — Z0000 Encounter for general adult medical examination without abnormal findings: Secondary | ICD-10-CM | POA: Diagnosis not present

## 2019-09-05 DIAGNOSIS — N401 Enlarged prostate with lower urinary tract symptoms: Secondary | ICD-10-CM

## 2019-09-05 DIAGNOSIS — Z1211 Encounter for screening for malignant neoplasm of colon: Secondary | ICD-10-CM

## 2019-09-05 LAB — RENAL FUNCTION PANEL
Albumin: 4.5 g/dL (ref 3.5–5.2)
BUN: 16 mg/dL (ref 6–23)
CO2: 29 mEq/L (ref 19–32)
Calcium: 9.8 mg/dL (ref 8.4–10.5)
Chloride: 99 mEq/L (ref 96–112)
Creatinine, Ser: 0.95 mg/dL (ref 0.40–1.50)
GFR: 78.63 mL/min (ref >60.00)
Glucose, Bld: 189 mg/dL — ABNORMAL HIGH (ref 70–99)
Phosphorus: 2.6 mg/dL (ref 2.3–4.6)
Potassium: 4.3 mEq/L (ref 3.5–5.1)
Sodium: 137 mEq/L (ref 135–145)

## 2019-09-05 LAB — LIPID PANEL
Cholesterol: 217 mg/dL — ABNORMAL HIGH (ref 0–200)
HDL: 45.3 mg/dL (ref >39.00)
NonHDL: 171.22
Total CHOL/HDL Ratio: 5
Triglycerides: 223 mg/dL — ABNORMAL HIGH (ref 0.0–149.0)
VLDL: 44.6 mg/dL — ABNORMAL HIGH (ref 0.0–40.0)

## 2019-09-05 LAB — HEPATIC FUNCTION PANEL
ALT: 15 U/L (ref 0–53)
AST: 13 U/L (ref 0–37)
Albumin: 4.5 g/dL (ref 3.5–5.2)
Alkaline Phosphatase: 93 U/L (ref 39–117)
Bilirubin, Direct: 0.1 mg/dL (ref 0.0–0.3)
Total Bilirubin: 0.7 mg/dL (ref 0.2–1.2)
Total Protein: 7 g/dL (ref 6.0–8.3)

## 2019-09-05 LAB — HEMOGLOBIN A1C: Hgb A1c MFr Bld: 8.3 % — ABNORMAL HIGH (ref 4.6–6.5)

## 2019-09-05 LAB — PSA, MEDICARE: PSA: 0.65 ng/ml (ref 0.10–4.00)

## 2019-09-05 LAB — HM DIABETES FOOT EXAM

## 2019-09-05 LAB — CBC
HCT: 45.4 % (ref 39.0–52.0)
Hemoglobin: 15.2 g/dL (ref 13.0–17.0)
MCHC: 33.5 g/dL (ref 30.0–36.0)
MCV: 89.2 fl (ref 78.0–100.0)
Platelets: 208 10*3/uL (ref 150.0–400.0)
RBC: 5.09 Mil/uL (ref 4.22–5.81)
RDW: 14.3 % (ref 11.5–15.5)
WBC: 6.9 10*3/uL (ref 4.0–10.5)

## 2019-09-05 LAB — T4, FREE: Free T4: 0.85 ng/dL (ref 0.60–1.60)

## 2019-09-05 LAB — LDL CHOLESTEROL, DIRECT: Direct LDL: 132 mg/dL

## 2019-09-05 MED ORDER — TAMSULOSIN HCL 0.4 MG PO CAPS: 0.4000 mg | ORAL_CAPSULE | Freq: Every day | ORAL | 3 refills | Status: DC

## 2019-09-05 NOTE — Telephone Encounter (Signed)
[facility] Night - Client TELEPHONE ADVICE RECORD [facility] Patient Name: URA SLAIGHT Gender: Male DOB: Feb 17, 1951 Age: 68 Y 10 M 22 D Return Phone Number: [phone] (Primary) Address: City/State/Zip: [place] [address] Client [facility] - Client Client Site [place] Physician Viviana Simpler - MD Contact Type Call Who Is Calling Patient / Member / Family / Caregiver Call Type Triage / Clinical Relationship To Patient Self Return Phone Number [phone] (Primary) Chief Complaint BREATHING - shortness of breath or sounds breathless Reason for Call Symptomatic / Request for [place] states is calling to confirm an appointment tomorrow but he does have shortness of breath and body aches. Translation No Nurse Assessment Nurse: Hassell Done, RN, Melanie Date/Time (Eastern Time): 09/04/2019 5:29:00 PM Confirm and document reason for call. If symptomatic, describe symptoms. ---Caller states he is having SOB and body aches. Has had SOB for about 2 weeks. Also having knee pain. Has had this pain for a while just got worse .Caller hopes to keep the appointment tomorrow at 10:30. Has the patient had close contact with a person known or suspected to have the novel coronavirus illness OR traveled / lives in area with major community spread (including international travel) in the last 14 days from the onset of symptoms? * If Asymptomatic, screen for exposure and travel within the last 14 days. ---No Does the patient have any new or worsening symptoms? ---Yes Will a triage be completed? ---Yes Related visit to physician within the last 2 weeks? ---No Does the PT have any chronic conditions? (i.e. diabetes, asthma, this includes High risk factors for pregnancy, etc.) ---Yes List chronic conditions. ---diabetes Is this a behavioral health or substance abuse  call? ---No Guidelines Guideline Title Affirmed Question Affirmed Notes Nurse Date/Time (Eastern Time) Coronavirus (COVID-19) - Diagnosed or Suspected MILD difficulty breathing (e.g., minimal/no SOB at rest, SOB with walking, pulse <100) Hassell Done, RN, Threasa Beards 09/04/2019 5:34:49 PM PLEASE NOTE: All timestamps contained within this report are represented as [place] Standard Time. CONFIDENTIALTY NOTICE: This fax transmission is intended only for the addressee. It contains information that is legally privileged, confidential or otherwise protected from use or disclosure. If you are not the intended recipient, you are strictly prohibited from reviewing, disclosing, copying using or disseminating any of this information or taking any action in reliance on or regarding this information. If you have received this fax in error, please notify us immediately by telephone so that we can arrange for its return to [place]. Phone: [phone], Toll-Free: [phone], Fax: [phone] Page: 2 of 2 Call Id: [id number] [place]. Time Eilene Ghazi Time) Disposition Final User 09/04/2019 5:26:59 PM Send to Urgent Rush Barer, [place] 09/04/2019 5:41:54 PM Paged On Call back to [facility], RN, Threasa Beards 09/04/2019 5:48:00 PM Paged On Call back to [facility], RN, Threasa Beards 09/04/2019 6:21:28 PM Paged On Call back to [facility], [place], Threasa Beards 09/04/2019 5:47:43 PM Call PCP Now Yes Hassell Done, RN, Donnajean Lopes Disagree/Comply Comply Caller Understands Yes PreDisposition Call Doctor Care Advice Given Per Guideline CALL PCP NOW: * You need to discuss this with your doctor (or NP/PA). ALTERNATE DISPOSITION - CALL TELEMEDICINE PROVIDER NOW: * Telemedicine may be your best choice for care during this COVID-19 outbreak. HOW TO PROTECT OTHERS - WHEN YOU ARE SICK WITH COVID-19: * [place] HANDS OFTEN: Wash hands often with soap and water. After coughing or sneezing are important times. * WEAR A MASK: Wear  a facemask when  around others. Always wear a facemask (if available) if you have to leave your home (such as going to a medical facility). CALL BACK IF: * You become worse. CARE ADVICE given per CORONAVIRUS (COVID-19) - DIAGNOSED OR SUSPECTED (Adult) guideline. Paging DoctorName Phone DateTime Result/Outcome Message Type Notes Scarlette Calico - MD [phone] 09/04/2019 5:41:54 PM Paged On Call Back to Call Center Doctor Paged Please call [phone] [facility] RN, in regards to a pt that has an appointment at 10:30 AM in the morning. Scarlette Calico - MD [phone] 09/04/2019 6:21:28 PM Called On Call Provider - Left Message Doctor Paged Scarlette Calico - MD 09/04/2019 6:37:23 PM Spoke with On Call - General Message Result On call felt with pt being SOB he would need to be seen and for him to show up for the appointment tomorrow. This was shared with caller.

## 2019-09-05 NOTE — Telephone Encounter (Signed)
Unable to reach pt by phone to get update on pts condition.

## 2019-09-05 NOTE — Assessment & Plan Note (Signed)
Will check PSA after discussion FIT Prefers no flu vaccine Not really exercising but physically active--has gained weight

## 2019-09-05 NOTE — Assessment & Plan Note (Addendum)
May be related to fluid accumulation ?diastolic CHF Concern with his actos Doesn't seem anginal Will check CXR Needs to take the furosemide more regularly Follow up with cardiology--may want to repeat echo  CXR looks normal He will try taking the furosemide more regularly for now

## 2019-09-05 NOTE — Progress Notes (Signed)
Subjective:    Patient ID: [name] [name], male    DOB: [birth year], 68 y.o.   MRN: [id number]  HPI Here for physical  Stopped the invokana 1.5 weeks ago Started with joint pain/myalgias with the last bottle (looked the same) Also got a rash around his eyes Now notices some increased SOB since cardiology visit 2 weeks ago Eyes did clear up but myalgias and SOB persists Wonders about tick bite--gets them frequently No fever/rash  Doing the same work  Checking sugars regularly this week Running 180-190 fasting No foot numbness or pain Increased edema lately--- using furosemide more regularly Known HTN and CAD  Has been watching weight--has gone up of late  No chest pain--just some mild discomfort--not clearly exertional Hasn't used the nitro though  Current Outpatient Medications on File Prior to Visit  Medication Sig Dispense Refill  . aspirin EC 81 MG tablet Take 1 tablet (81 mg total) by mouth daily. 90 tablet 3  . canagliflozin (INVOKANA) 300 MG TABS tablet Take 1 tablet (300 mg total) by mouth daily before breakfast. 90 tablet 3  . furosemide (LASIX) 40 MG tablet Take 1 tablet (40 mg total) by mouth daily as needed. 90 tablet 3  . glucose blood (FREESTYLE LITE) test strip Use as instructed, patient tests once daily. Dx: 250.00 300 each 3  . HYDROcodone-acetaminophen (NORCO/VICODIN) 5-325 MG tablet TAKE 1 TABLET BY MOUTH 2 TIMES DAILY 60 tablet 0  . lisinopril (ZESTRIL) 20 MG tablet Take 1 tablet (20 mg total) by mouth daily. 90 tablet 3  . metFORMIN (GLUCOPHAGE) 1000 MG tablet Take 1 tablet (1,000 mg total) by mouth 2 (two) times daily with a meal. 180 tablet 3  . nitroGLYCERIN (NITROSTAT) 0.4 MG SL tablet Place 1 tablet (0.4 mg total) under the tongue every 5 (five) minutes as needed for chest pain. 25 tablet 3  . pioglitazone (ACTOS) 30 MG tablet TAKE 1 TABLET DAILY 90 tablet 3  . potassium chloride SA (K-DUR,KLOR-CON) 20 MEQ tablet Take 1 tablet (20 mEq total) by  mouth 2 (two) times daily as needed (Take with lasix). 90 tablet 3  . triamcinolone cream (KENALOG) 0.1 % Apply 1 application topically 2 (two) times daily as needed. 30 g 0   No current facility-administered medications on file prior to visit.     Allergies  Allergen Reactions  . Crestor [Rosuvastatin] Other (See Comments)    Muscle aches.  . Doxazosin Mesylate Other (See Comments)    REACTION: didn't work and may have caused SOB  . Doxycycline Other (See Comments)    REACTION: unspecified  . Glimepiride Other (See Comments)    REACTION: red eyes  . Glipizide Other (See Comments)    REACTION: myalgia??  . Losartan Potassium-Hctz Other (See Comments)    REACTION: chest \\T \ leg pain  . Metoprolol Tartrate Other (See Comments)    Mood swings, memory changes  . Pravastatin Other (See Comments)    Muscle aches   . Rosiglitazone Maleate Other (See Comments)    REACTION: myalgia  . Simvastatin Other (See Comments)    REACTION: myalgias  . Tetracyclines & Related   . Carvedilol Other (See Comments)    Felt bad    Past Medical History:  Diagnosis Date  . Angina   . Arthritis   . BPH (benign prostatic hypertrophy)   . Carpal tunnel syndrome   . Coronary artery disease 11/12/11   s/p CABG x 3  . Diabetes mellitus    type 2  .  GERD (gastroesophageal reflux disease)   . Hx of colonic polyps   . Hyperlipidemia   . Hypertension   . Kidney stones   . OSA (obstructive sleep apnea)    uses cpap  . Shortness of breath     Past Surgical History:  Procedure Laterality Date  . CORONARY ARTERY BYPASS GRAFT  11/12/2011   Procedure: CORONARY ARTERY BYPASS GRAFTING (CABG);  Surgeon: Grace Isaac, MD;  Location: [place];  Service: Open Heart Surgery;  Laterality: N/A;  Coronary Artery Bypass Graft times three on pump utilizing left internal mammary artery and right saphenous vein harvested endoscopically   . HERNIA REPAIR    . KNEE ARTHROPLASTY Right 11/23/2017   Procedure:  COMPUTER ASSISTED TOTAL KNEE ARTHROPLASTY;  Surgeon: Dereck Leep, MD;  Location: [facility];  Service: Orthopedics;  Laterality: Right;  . KNEE SURGERY     left  . MENISCUS REPAIR  03/08   right knee    Family History  Problem Relation Age of Onset  . Coronary artery disease Father   . Prostate cancer Father   . Diabetes Neg Hx   . Hypertension Neg Hx     Social History   Socioeconomic History  . Marital status: Married    Spouse name: Not on file  . Number of children: 3  . Years of education: Not on file  . Highest education level: Not on file  Occupational History  . Occupation: Surveyor, quantity  Social Needs  . Financial resource strain: Not on file  . Food insecurity    Worry: Not on file    Inability: Not on file  . Transportation needs    Medical: Not on file    Non-medical: Not on file  Tobacco Use  . Smoking status: Never Smoker  . Smokeless tobacco: Never Used  Substance and Sexual Activity  . Alcohol use: No  . Drug use: No  . Sexual activity: Yes  Lifestyle  . Physical activity    Days per week: Not on file    Minutes per session: Not on file  . Stress: Not on file  Relationships  . Social Herbalist on phone: Not on file    Gets together: Not on file    Attends religious service: Not on file    Active member of club or organization: Not on file    Attends meetings of clubs or organizations: Not on file    Relationship status: Not on file  . Intimate partner violence    Fear of current or ex partner: Not on file    Emotionally abused: Not on file    Physically abused: Not on file    Forced sexual activity: Not on file  Other Topics Concern  . Not on file  Social History Narrative   No living will   Requests wife as health care POA   Would accept resuscitation   Would accept tube feedings   Review of Systems  Constitutional: Positive for fatigue and unexpected weight change.       Wears seat belt most of the time--discussed  HENT:  Negative for hearing loss, tinnitus and trouble swallowing.        Has dentures--some problems with fit  Eyes: Negative for visual disturbance.       No diplopia or unilateral vision loss Needs new glasses Going in November  Respiratory: Positive for shortness of breath.        Occ cough  Cardiovascular: Positive for  chest pain and leg swelling. Negative for palpitations.  Gastrointestinal: Negative for blood in stool and constipation.       Heartburn--- rolaids/tums helps. Mostly in last week  Endocrine: Negative for polydipsia and polyuria.  Genitourinary: Positive for difficulty urinating.       Slow stream  Increasing nocturia-getting to be a bother No sexual problems   Musculoskeletal: Positive for arthralgias, back pain and myalgias.       Left shoulder bad--interested in cortisone shot  Skin:       Had festered lesion left arm---drained now and healing Has knot behind left hip  Allergic/Immunologic: Positive for environmental allergies. Negative for immunocompromised state.       No meds  Neurological: Negative for dizziness, syncope, light-headedness and headaches.  Hematological: Negative for adenopathy. Bruises/bleeds easily.  Psychiatric/Behavioral:       Sleep is not as good---nocturia and aches Lots of stress--some mood issues (but nothing persistent)       Objective:   Physical Exam  Constitutional: He appears well-developed. No distress.  HENT:  Head: Normocephalic and atraumatic.  Right Ear: External ear normal.  Left Ear: External ear normal.  Mouth/Throat: Oropharynx is clear and moist. No oropharyngeal exudate.  Eyes: Pupils are equal, round, and reactive to light. Conjunctivae are normal.  Neck: No thyromegaly present.  Cardiovascular: Normal rate, regular rhythm, normal heart sounds and intact distal pulses. Exam reveals no gallop.  No murmur heard. Respiratory: Effort normal and breath sounds normal. No respiratory distress. He has no wheezes. He has  no rales.  GI: Soft. There is no abdominal tenderness.  Musculoskeletal:        General: No tenderness or edema.  Lymphadenopathy:    He has no cervical adenopathy.  Neurological:  Normal sensation in feet  Skin:  Inflamed lump lateral left hip--?insect bite Multiple minor lesions---like scratches/bites  Psychiatric: He has a normal mood and affect. His behavior is normal.           Assessment & Plan:

## 2019-09-05 NOTE — Assessment & Plan Note (Signed)
Not clear the SOB is related to ischemia May need further testing

## 2019-09-05 NOTE — Telephone Encounter (Signed)
Will still plan the visit for today

## 2019-09-05 NOTE — Assessment & Plan Note (Signed)
No problems with PDMP 

## 2019-09-05 NOTE — Assessment & Plan Note (Signed)
Ready to try med Will try tamsulosin---warned about dizziness

## 2019-09-05 NOTE — Assessment & Plan Note (Signed)
Will check labs If higher, will have him restart the invokana and then recheck 3 months If he can't tolerate that, consider change to dulaglutide or insulin

## 2019-09-07 LAB — PAIN MGMT, PROFILE 8 W/CONF, U
6 Acetylmorphine: NEGATIVE ng/mL
Alcohol Metabolites: NEGATIVE ng/mL (ref <500)
Amphetamines: NEGATIVE ng/mL
Benzodiazepines: NEGATIVE ng/mL
Buprenorphine, Urine: NEGATIVE ng/mL
Cocaine Metabolite: NEGATIVE ng/mL
Codeine: NEGATIVE ng/mL
Creatinine: 103.2 mg/dL
Hydrocodone: NEGATIVE ng/mL
Hydromorphone: 56 ng/mL
MDMA: NEGATIVE ng/mL
Marijuana Metabolite: NEGATIVE ng/mL
Morphine: NEGATIVE ng/mL
Norhydrocodone: 136 ng/mL
Opiates: POSITIVE ng/mL
Oxidant: NEGATIVE ug/mL
Oxycodone: NEGATIVE ng/mL
pH: 5.9 (ref 4.5–9.0)

## 2019-09-19 ENCOUNTER — Other Ambulatory Visit
Admission: RE | Admit: 2019-09-19 | Discharge: 2019-09-19 | Disposition: A | Discharge status: Home / Self Care | Payer: BC Managed Care – PPO | Source: Ambulatory Visit | Attending: Physician Assistant | Admitting: Physician Assistant

## 2019-09-19 ENCOUNTER — Telehealth: Payer: Self-pay

## 2019-09-19 ENCOUNTER — Ambulatory Visit (INDEPENDENT_AMBULATORY_CARE_PROVIDER_SITE_OTHER): Payer: BC Managed Care – PPO

## 2019-09-19 ENCOUNTER — Other Ambulatory Visit: Payer: Self-pay

## 2019-09-19 ENCOUNTER — Encounter: Payer: Self-pay | Admitting: Nurse Practitioner

## 2019-09-19 ENCOUNTER — Ambulatory Visit (INDEPENDENT_AMBULATORY_CARE_PROVIDER_SITE_OTHER): Payer: BC Managed Care – PPO | Admitting: Physician Assistant

## 2019-09-19 VITALS — BP 130/70 | HR 83 | Temp 96.6°F | Ht 64.0 in | Wt 215.5 lb

## 2019-09-19 DIAGNOSIS — I10 Essential (primary) hypertension: Secondary | ICD-10-CM

## 2019-09-19 DIAGNOSIS — IMO0002 Reserved for concepts with insufficient information to code with codable children: Secondary | ICD-10-CM

## 2019-09-19 DIAGNOSIS — I2510 Atherosclerotic heart disease of native coronary artery without angina pectoris: Secondary | ICD-10-CM

## 2019-09-19 DIAGNOSIS — I25119 Atherosclerotic heart disease of native coronary artery with unspecified angina pectoris: Secondary | ICD-10-CM

## 2019-09-19 DIAGNOSIS — R079 Chest pain, unspecified: Secondary | ICD-10-CM | POA: Diagnosis not present

## 2019-09-19 DIAGNOSIS — R600 Localized edema: Secondary | ICD-10-CM

## 2019-09-19 DIAGNOSIS — Z951 Presence of aortocoronary bypass graft: Secondary | ICD-10-CM | POA: Diagnosis not present

## 2019-09-19 DIAGNOSIS — I739 Peripheral vascular disease, unspecified: Secondary | ICD-10-CM | POA: Diagnosis not present

## 2019-09-19 DIAGNOSIS — E118 Type 2 diabetes mellitus with unspecified complications: Secondary | ICD-10-CM

## 2019-09-19 DIAGNOSIS — R0602 Shortness of breath: Secondary | ICD-10-CM | POA: Diagnosis not present

## 2019-09-19 DIAGNOSIS — G4733 Obstructive sleep apnea (adult) (pediatric): Secondary | ICD-10-CM | POA: Diagnosis not present

## 2019-09-19 DIAGNOSIS — E785 Hyperlipidemia, unspecified: Secondary | ICD-10-CM | POA: Diagnosis not present

## 2019-09-19 DIAGNOSIS — Z789 Other specified health status: Secondary | ICD-10-CM | POA: Diagnosis not present

## 2019-09-19 DIAGNOSIS — E1165 Type 2 diabetes mellitus with hyperglycemia: Secondary | ICD-10-CM

## 2019-09-19 DIAGNOSIS — Z9989 Dependence on other enabling machines and devices: Secondary | ICD-10-CM

## 2019-09-19 LAB — CBC
HCT: 43.7 % (ref 39.0–52.0)
Hemoglobin: 14.6 g/dL (ref 13.0–17.0)
MCH: 29.4 pg (ref 26.0–34.0)
MCHC: 33.4 g/dL (ref 30.0–36.0)
MCV: 88.1 fL (ref 80.0–100.0)
Platelets: 225 10*3/uL (ref 150–400)
RBC: 4.96 MIL/uL (ref 4.22–5.81)
RDW: 13.5 % (ref 11.5–15.5)
WBC: 8.1 10*3/uL (ref 4.0–10.5)
nRBC: 0 % (ref 0.0–0.2)

## 2019-09-19 LAB — BASIC METABOLIC PANEL
Anion gap: 9 (ref 5–15)
BUN: 18 mg/dL (ref 8–23)
CO2: 25 mmol/L (ref 22–32)
Calcium: 9.4 mg/dL (ref 8.9–10.3)
Chloride: 103 mmol/L (ref 98–111)
Creatinine, Ser: 1.07 mg/dL (ref 0.61–1.24)
GFR calc Af Amer: >60 mL/min (ref >60)
GFR calc non Af Amer: >60 mL/min (ref >60)
Glucose, Bld: 243 mg/dL — ABNORMAL HIGH (ref 70–99)
Potassium: 3.9 mmol/L (ref 3.5–5.1)
Sodium: 137 mmol/L (ref 135–145)

## 2019-09-19 MED ORDER — ISOSORBIDE MONONITRATE ER 30 MG PO TB24: 30.0000 mg | ORAL_TABLET | Freq: Every day | ORAL | 0 refills | Status: DC

## 2019-09-19 MED ORDER — ISOSORBIDE MONONITRATE ER 30 MG PO TB24: 30.0000 mg | ORAL_TABLET | Freq: Every day | ORAL | 3 refills | Status: DC

## 2019-09-19 MED ORDER — SODIUM CHLORIDE 0.9% FLUSH: 3.0000 mL | Freq: Two times a day (BID) | INTRAVENOUS | Status: DC

## 2019-09-19 NOTE — Telephone Encounter (Signed)
Patient seen today for a LE study. While in the office the patient alerted Consuela Mimes that for the last 2-3 days he had been having chest pain similar to the pain he had prior to his 2012 CABG.  RN went to speak with the patient. Patient not currently having chest pain. Advised the patient that he should be seen soon to evaluate his symptoms. Patient appt scheduled of today @ 3pm with Ignacia Bayley, NP. Patient is aware of the appt date and time.

## 2019-09-19 NOTE — Progress Notes (Signed)
Office Visit    Patient Name: [name] [name] Date of Encounter: 09/19/2019  Primary Care Provider:  Venia Carbon, MD Primary Cardiologist:  Ida Rogue, MD  Chief Complaint    68 year old male with history of CAD s/p three-vessel CABG in 11/2011 with LIMA to LAD, reversed SVG sequentially to D1 and D3, diabetes, hypertension, hyperlipidemia with statin and Zetia intolerance, obesity, nephrolithiasis, carpal tunnel syndrome, chronic pain syndrome, BPH, arthritis, and OSA on CPAP, and who presents today for evaluation of chest pain reported during ABIs obtained earlier in office today.  Past Medical History    Past Medical History:  Diagnosis Date  . Angina   . Arthritis   . BPH (benign prostatic hypertrophy)   . Carpal tunnel syndrome   . Coronary artery disease 11/12/11   s/p CABG x 3  . Diabetes mellitus    type 2  . GERD (gastroesophageal reflux disease)   . Hx of colonic polyps   . Hyperlipidemia   . Hypertension   . Kidney stones   . OSA (obstructive sleep apnea)    uses cpap  . Shortness of breath    Past Surgical History:  Procedure Laterality Date  . CORONARY ARTERY BYPASS GRAFT  11/12/2011   Procedure: CORONARY ARTERY BYPASS GRAFTING (CABG);  Surgeon: Grace Isaac, MD;  Location: [employer];  Service: Open Heart Surgery;  Laterality: N/A;  Coronary Artery Bypass Graft times three on pump utilizing left internal mammary artery and right saphenous vein harvested endoscopically   . HERNIA REPAIR    . KNEE ARTHROPLASTY Right 11/23/2017   Procedure: COMPUTER ASSISTED TOTAL KNEE ARTHROPLASTY;  Surgeon: Dereck Leep, MD;  Location: [facility];  Service: Orthopedics;  Laterality: Right;  . KNEE SURGERY     left  . MENISCUS REPAIR  03/08   right knee    Allergies  Allergies  Allergen Reactions  . Crestor [Rosuvastatin] Other (See Comments)    Muscle aches.  . Doxazosin Mesylate Other (See Comments)    REACTION: didn't work and may have caused SOB   . Doxycycline Other (See Comments)    REACTION: unspecified  . Glimepiride Other (See Comments)    REACTION: red eyes  . Glipizide Other (See Comments)    REACTION: myalgia??  . Losartan Potassium-Hctz Other (See Comments)    REACTION: chest \\T \ leg pain  . Metoprolol Tartrate Other (See Comments)    Mood swings, memory changes  . Pravastatin Other (See Comments)    Muscle aches   . Rosiglitazone Maleate Other (See Comments)    REACTION: myalgia  . Simvastatin Other (See Comments)    REACTION: myalgias  . Tetracyclines & Related   . Carvedilol Other (See Comments)    Felt bad    History of Present Illness    68 year old male with PMH as above.  He has a history of CAD s/p three-vessel CABG in 11/2011 with LIMA to LAD; reversed SVG sequentially to D1 and D3.  He also has a history of chronic pain syndrome and arthritis.    He has not required ischemic evaluation since his bypass surgery in 2012.  Most recent echo showed normal EF and without wall motion abnormalities.  He was seen in the office 08/20/2019 and doing well from a cardiac perspective. He denied any symptoms concerning for for angina. He did report increasing bilateral leg fatigue/heaviness/cramping that occurred with ambulation over the last several weeks leading up to the appointment with recommendation for lower extremity ABIs and  statin or PCSK9i.   He did not want a rechallenge of statin or Zetia and deferred PCSK9 inhibitors at that time. He was on ASA 162 mg daily with recommendation to decrease to 81 mg daily. He was not checking his blood pressure at home with clinic BP elevated and lisinopril therefore increased to 20mg  daily.  He continued to report intolerance to BB. He was taking his Lasix exclusively on the weekend, given the distance required for him to walk to the restroom at work during the week.    He presents today for evaluation of atypical chest pain, reported during ABIs obtained in the office earlier  this morning.  On further inquiry, the patient reports that he has been experiencing frequent nightly episodes of shortness of breath and chest pressure that wakes him from sleep.  He reports that he is compliant with his CPAP and this shortness of breath is not similar to that he experiences with sleep apnea.  He describes the chest pressure as a "bear hug," and similar to that felt prior to his 2012 CABG.  He states that he has never had this chest pressure during the day at exertion or rest but does have shortness of breath that occurs at rest during the day without any clear triggers or exacerbating/alleviating factors and that started 1-2 weeks ago.  His nightly episodes of SOB/chest pressure started approximately 1 month ago.  He is unable to estimate a length of time for each nightly episode or estimate the amount of time before he can fall back to sleep.  No relief with SL nitro per patient. Each nightly episode of CP/SOB is associated with L shoulder pain; however, he reports this shoulder pain is a chronic issue for which he cannot start cortisone injections due to elevated A1C after he recently self discontinued his insulin.  (He has since restarted his insulin.)  No racing heart rate or palpitations.  He denies DOE during the day, as he is able to climb the stairs for his job during the day. He does continue to note bilateral leg cramping and heaviness with ABIs obtained earlier this morning without significant findings. He continues to take his Lasix only on the weekends, as it is too far for him to walk to the bathroom at work. He does not check his BP at home. No reported orthopnea or early staiety.  He does not weigh himself at home.  He notes chronic/stable abdominal distention. He also reports chronic lower extremity edema, which is often worse in 1 leg due to his past knee surgeries and not unusual for him. No asymmetric erythema. No signs or symptoms of bleeding, such as melena, hematochezia, or  hemoptysis.  No cough or recent exposure to anyone testing positive for COVID-19.  No recent syncope, presyncope, constipation, diarrhea, or concerning urinary changes.  Home Medications    Prior to Admission medications   Medication Sig Start Date End Date Taking? Authorizing Provider  aspirin EC 81 MG tablet Take 1 tablet (81 mg total) by mouth daily. 08/20/19   Dunn, Areta Haber, PA-C  canagliflozin (INVOKANA) 300 MG TABS tablet Take 1 tablet (300 mg total) by mouth daily before breakfast. 12/27/18   Venia Carbon, MD  furosemide (LASIX) 40 MG tablet Take 1 tablet (40 mg total) by mouth daily as needed. 07/04/18   Minna Merritts, MD  glucose blood (FREESTYLE LITE) test strip Use as instructed, patient tests once daily. Dx: 250.00 01/14/12   Venia Carbon, MD  HYDROcodone-acetaminophen (NORCO/VICODIN) 5-325 MG tablet TAKE 1 TABLET BY MOUTH 2 TIMES DAILY 09/03/19   Viviana Simpler I, MD  lisinopril (ZESTRIL) 20 MG tablet Take 1 tablet (20 mg total) by mouth daily. 08/20/19   Dunn, Areta Haber, PA-C  metFORMIN (GLUCOPHAGE) 1000 MG tablet Take 1 tablet (1,000 mg total) by mouth 2 (two) times daily with a meal. 12/05/18   Venia Carbon, MD  nitroGLYCERIN (NITROSTAT) 0.4 MG SL tablet Place 1 tablet (0.4 mg total) under the tongue every 5 (five) minutes as needed for chest pain. 09/14/17   Viviana Simpler I, MD  pioglitazone (ACTOS) 30 MG tablet TAKE 1 TABLET DAILY 06/01/18   Venia Carbon, MD  potassium chloride SA (K-DUR,KLOR-CON) 20 MEQ tablet Take 1 tablet (20 mEq total) by mouth 2 (two) times daily as needed (Take with lasix). 03/20/18   Venia Carbon, MD  tamsulosin (FLOMAX) 0.4 MG CAPS capsule Take 1 capsule (0.4 mg total) by mouth daily. 09/05/19   Venia Carbon, MD  triamcinolone cream (KENALOG) 0.1 % Apply 1 application topically 2 (two) times daily as needed. 02/26/14   Venia Carbon, MD    Review of Systems    He denies palpitations, dyspnea, pnd, orthopnea, n, v, dizziness,  syncope, weight gain, or early satiety.  He reports night dyspnea/chest pressure and lower extremity edema that resolves with his Lasix on the weekends.  All other systems reviewed and are otherwise negative except as noted above.  Physical Exam    VS:  BP 130/70 (BP Location: Left Arm, Patient Position: Sitting, Cuff Size: Normal)   Pulse 83   Temp (!) 96.6 F (35.9 C)   Ht 5\' 4"  (1.626 m)   Wt 215 lb 8 oz (97.8 kg)   SpO2 96%   BMI 36.99 kg/m  , BMI Body mass index is 36.99 kg/m. GEN: Obese male in no acute distress. HEENT: normal. Neck: Supple, no JVD, carotid bruits, or masses. Cardiac: RRR, no murmurs, rubs, or gallops. No clubbing, cyanosis.  Radials/DP/PT 2+ and equal bilaterally. 1+ pretibial b/l edema. Respiratory:  Respirations regular and unlabored, clear to auscultation bilaterally. GI: Soft, nontender, distended but not firm, hyperactive bowel sounds. MS: no deformity or atrophy. Skin: warm and dry, no rash. Neuro:  Strength and sensation are intact. Psych: Normal affect.  Accessory Clinical Findings    ECG personally reviewed by me today -NSR, 83 bpm, incomplete left bundle branch block, QRS 118, nonspecific ST and T wave abnormality with findings consistent with severe LVH and similar to 2018 EKG- no acute changes.  Assessment & Plan    Atypical chest pain H/o CAD s/p CABG (2012) --No current chest pain.  Reports the last episode of chest pressure and shortness of breath that woke him from sleep as occurring this morning.  He does not experience these episodes during the day.  No DOE. Chest pain has atypical features, such as only occurring at night; however, given the patient describes his CP as similar to that felt before 2012 CABG with risk factors for cardiac etiology, further ischemic workup recommended.  Patient preference is for [facility], given a Lexiscan will likely be unrevealing due obesity / image quality.  --Risks and benefits of cardiac catheterization have  been discussed with the patient.  These include bleeding, infection, kidney damage, stroke, heart attack, death.  The patient understands these risks and is willing to proceed. --Obtain COVID-19 testing this week with cardiac catheterization with Dr. Rockey Situ Thursday or Friday of next week  to allow time for turnaround.  Update BMET and CBC today.  --If cardiac catheterization is unrevealing, could consider work-up for pulmonary embolism with continued SOB/CP. Patient denies any s/sx consistent with pulmonary embolism and no concerning physical exam findings today. Will defer further workup for now given low clinical suspicion at this time, as well as to avoid repeat contrast exposure with a CTA prior to [facility].  --Add Imdur 30mg  daily.  Continue ASA 81 mg and lisinopril 20 mg.  Patient reports intolerance to beta-blockers, statins, and Zetia. PCSK9 inhibitors discussed again during this appointment and declined.   --Risk factor modification discussed, including lifestyle changes, such as diet/exercise/weight loss.  Home weights and BP checks encouraged. Also encouraged taking Lasix during the week; however, patient continues to prefer taking it on weekends.  Lower extremity claudication --ABIs this morning unrevealing.  Patient reports ongoing sx with today's description suggestive of diabetic neuropathy. Recommend better control of A1C and that patient start PCSK9 inhibitor as above. Patient declines PCSK9 inhibitor today.  HTN --Blood pressure borderline today. Home BP checks encouraged.  Continue current medical management and reassess BP at follow-up.  HLD --Updated LDL 132 and still not at goal of less than 70.  Intolerant to statin therapy and Zetia.  Long discussion regarding PCSK9 inhibitor with patient declining start of this medication as above. Recommend again revisit at follow-up.  DM2 --A1C 8.3. Recommend continue insulin, dietary changes. Consider lower extremity symptoms due to possible  diabetic neuropathy given ABIs without significant findings today.  OSA --Continue CPAP use.  Patient reports current symptoms are not consistent with those experienced during sleep apnea and reports nightly compliance with CPAP.  Disposition: Start Imdur 30mg  daily. Update CBC, BMET. Pre-cath COVID-19 testing this week. Left heart cardiac catheterization with Dr. Rockey Situ next week.  Follow-up in 2 weeks.  Arvil Chaco, P A-C 09/19/2019, 5:38 PM

## 2019-09-19 NOTE — Progress Notes (Incomplete)
   Patient ID: [name] [name], male    DOB: 1951-11-10, 68 y.o.   MRN: [id number]  At the beginning of a lower extremity arterial exam, during a review of symptoms, the patient reported chest pressure every morning for the past 3 days, including the current date. He remarked that the symptom felt similar to the feeling he got several years ago before his cardiac catheterization. He also said that he had not reported this symptom to anyone other than myself. This was immediately reported to RN's [employer] and [employer]. The patient was then scheduled to see a provider the same day, at the earliest available time.     At the time of the vascular exam he denied chest symptoms or dyspnea.     Review of Systems    Physical Exam

## 2019-09-19 NOTE — Patient Instructions (Addendum)
Medication Instructions:  1- START Imdur Take 1 tablet (30 mg total) by mouth daily If you need a refill on your cardiac medications before your next appointment, please call your pharmacy.   Lab work: 1- Your physician recommends that you return for lab work in: today at [facility]. (CBC, BMET) No appt is needed. Hours are M-F 7AM- 6 PM. 2- CV19 Pre admit testing DRIVE THRU  Please report to the PAT testing site (medical arts building) on ______10/19___ date ______12:30-2:30PM_____ time for your DRIVE THRU covid testing that is required prior to your procedure.  Following covid testing, please remain in quarantine. If you must be around others, please wash hands, avoid touching face and wear your mask.  If you have labs (blood work) drawn today and your tests are completely normal, you will receive your results only by: Marland Kitchen MyChart Message (if you have MyChart) OR . A paper copy in the mail If you have any lab test that is abnormal or we need to change your treatment, we will call you to review the results.  Testing/Procedures: 1- L Heart Cath    [name], [place] [address] Dept: [phone] Loc: [phone]  [name] [name]  09/19/2019  You are scheduled for a Cardiac Catheterization on Thursday, October 22 with Dr. Ida Rogue.  1. Please arrive at the medical mall of [facility] at 7:30 AM (This time is one hour before your procedure to ensure your preparation). Free valet parking service is available.   Special note: Every effort is made to have your procedure done on time. Please understand that emergencies sometimes delay scheduled procedures.  2. Diet: Do not eat solid foods after midnight.  The patient may have clear liquids until 5am upon the day of the procedure.  3. Labs: You will need to have blood drawn today. 4. Medication instructions in  preparation for your procedure:   Stop taking Imdur after am 10/21 Hold Lasix 10/22 Do not take Diabetes Med Glucophage (Metformin) on the day of the procedure and HOLD 48 HOURS AFTER THE PROCEDURE.  On the morning of your procedure, take your Aspirin and any morning medicines NOT listed above.  You may use sips of water.  5. Plan for one night stay--bring personal belongings. 6. Bring a current list of your medications and current insurance cards. 7. You MUST have a responsible person to drive you home. 8. Someone MUST be with you the first 24 hours after you arrive home or your discharge will be delayed. 9. Please wear clothes that are easy to get on and off and wear slip-on shoes.  Thank you for allowing [place] to care for you!   --  Invasive Cardiovascular services   Follow-Up: At [facility], you and your health needs are our priority.  As part of our continuing mission to provide you with exceptional heart care, we have created designated Provider Care Teams.  These Care Teams include your primary Cardiologist (physician) and Advanced Practice Providers (APPs -  Physician Assistants and Nurse Practitioners) who all work together to provide you with the care you need, when you need it. You will need a follow up appointment in 2 weeks.  You may see Ida Rogue, MD or Murray Hodgkins, NP.

## 2019-09-20 ENCOUNTER — Other Ambulatory Visit: Payer: Self-pay | Admitting: Internal Medicine

## 2019-09-21 ENCOUNTER — Telehealth: Payer: Self-pay | Admitting: Cardiovascular Disease

## 2019-09-21 NOTE — Telephone Encounter (Signed)
Recieved request from : Scanned to roi  Forwarded to [employer] for processing

## 2019-09-24 ENCOUNTER — Other Ambulatory Visit
Admission: RE | Admit: 2019-09-24 | Discharge: 2019-09-24 | Disposition: A | Discharge status: Home / Self Care | Payer: BC Managed Care – PPO | Source: Ambulatory Visit | Attending: Cardiovascular Disease | Admitting: Cardiovascular Disease

## 2019-09-24 DIAGNOSIS — Z20828 Contact with and (suspected) exposure to other viral communicable diseases: Secondary | ICD-10-CM | POA: Diagnosis not present

## 2019-09-24 DIAGNOSIS — Z01812 Encounter for preprocedural laboratory examination: Secondary | ICD-10-CM | POA: Diagnosis not present

## 2019-09-25 LAB — SARS CORONAVIRUS 2 (TAT 6-24 HRS): SARS Coronavirus 2: NEGATIVE

## 2019-09-27 ENCOUNTER — Ambulatory Visit
Admission: RE | Admit: 2019-09-27 | Discharge: 2019-09-28 | Disposition: A | Discharge status: Home / Self Care | Payer: BC Managed Care – PPO | Attending: Cardiovascular Disease | Admitting: Cardiovascular Disease

## 2019-09-27 ENCOUNTER — Encounter
Admission: RE | Disposition: A | Discharge status: Home / Self Care | Payer: Self-pay | Source: Home / Self Care | Attending: Cardiovascular Disease

## 2019-09-27 ENCOUNTER — Other Ambulatory Visit: Payer: Self-pay

## 2019-09-27 DIAGNOSIS — Z955 Presence of coronary angioplasty implant and graft: Secondary | ICD-10-CM | POA: Diagnosis not present

## 2019-09-27 DIAGNOSIS — I25110 Atherosclerotic heart disease of native coronary artery with unstable angina pectoris: Secondary | ICD-10-CM | POA: Diagnosis not present

## 2019-09-27 DIAGNOSIS — N401 Enlarged prostate with lower urinary tract symptoms: Secondary | ICD-10-CM | POA: Insufficient documentation

## 2019-09-27 DIAGNOSIS — I10 Essential (primary) hypertension: Secondary | ICD-10-CM | POA: Diagnosis present

## 2019-09-27 DIAGNOSIS — I2510 Atherosclerotic heart disease of native coronary artery without angina pectoris: Secondary | ICD-10-CM

## 2019-09-27 DIAGNOSIS — I200 Unstable angina: Secondary | ICD-10-CM

## 2019-09-27 DIAGNOSIS — Z7984 Long term (current) use of oral hypoglycemic drugs: Secondary | ICD-10-CM | POA: Diagnosis not present

## 2019-09-27 DIAGNOSIS — E785 Hyperlipidemia, unspecified: Secondary | ICD-10-CM | POA: Diagnosis not present

## 2019-09-27 DIAGNOSIS — Z881 Allergy status to other antibiotic agents status: Secondary | ICD-10-CM | POA: Insufficient documentation

## 2019-09-27 DIAGNOSIS — Z7982 Long term (current) use of aspirin: Secondary | ICD-10-CM | POA: Diagnosis not present

## 2019-09-27 DIAGNOSIS — R079 Chest pain, unspecified: Secondary | ICD-10-CM

## 2019-09-27 DIAGNOSIS — N138 Other obstructive and reflux uropathy: Secondary | ICD-10-CM | POA: Diagnosis not present

## 2019-09-27 DIAGNOSIS — Z951 Presence of aortocoronary bypass graft: Secondary | ICD-10-CM | POA: Diagnosis not present

## 2019-09-27 DIAGNOSIS — Z79899 Other long term (current) drug therapy: Secondary | ICD-10-CM | POA: Diagnosis not present

## 2019-09-27 DIAGNOSIS — I25119 Atherosclerotic heart disease of native coronary artery with unspecified angina pectoris: Secondary | ICD-10-CM | POA: Diagnosis not present

## 2019-09-27 DIAGNOSIS — E119 Type 2 diabetes mellitus without complications: Secondary | ICD-10-CM | POA: Diagnosis not present

## 2019-09-27 DIAGNOSIS — I2089 Other forms of angina pectoris: Secondary | ICD-10-CM | POA: Insufficient documentation

## 2019-09-27 DIAGNOSIS — Z789 Other specified health status: Secondary | ICD-10-CM | POA: Diagnosis present

## 2019-09-27 DIAGNOSIS — G894 Chronic pain syndrome: Secondary | ICD-10-CM | POA: Diagnosis present

## 2019-09-27 DIAGNOSIS — I208 Other forms of angina pectoris: Secondary | ICD-10-CM | POA: Insufficient documentation

## 2019-09-27 DIAGNOSIS — G4733 Obstructive sleep apnea (adult) (pediatric): Secondary | ICD-10-CM | POA: Diagnosis not present

## 2019-09-27 DIAGNOSIS — E782 Mixed hyperlipidemia: Secondary | ICD-10-CM | POA: Diagnosis not present

## 2019-09-27 DIAGNOSIS — Z7902 Long term (current) use of antithrombotics/antiplatelets: Secondary | ICD-10-CM | POA: Diagnosis not present

## 2019-09-27 HISTORY — PX: CORONARY PRESSURE/FFR STUDY: CATH118243

## 2019-09-27 HISTORY — PX: LEFT HEART CATH AND CORONARY ANGIOGRAPHY: CATH118249

## 2019-09-27 HISTORY — PX: CORONARY STENT INTERVENTION: CATH118234

## 2019-09-27 LAB — GLUCOSE, CAPILLARY
Glucose-Capillary: 144 mg/dL — ABNORMAL HIGH (ref 70–99)
Glucose-Capillary: 143 mg/dL — ABNORMAL HIGH (ref 70–99)
Glucose-Capillary: 278 mg/dL — ABNORMAL HIGH (ref 70–99)
Glucose-Capillary: 176 mg/dL — ABNORMAL HIGH (ref 70–99)

## 2019-09-27 LAB — POCT ACTIVATED CLOTTING TIME
Activated Clotting Time: 230 seconds
Activated Clotting Time: 312 seconds

## 2019-09-27 SURGERY — LEFT HEART CATH AND CORONARY ANGIOGRAPHY
Anesthesia: Moderate Sedation

## 2019-09-27 MED ORDER — ASPIRIN 81 MG PO CHEW
81.0000 mg | CHEWABLE_TABLET | ORAL | Status: AC
  Administered 2019-09-27: 08:00:00 81 mg via ORAL

## 2019-09-27 MED ORDER — METFORMIN HCL 500 MG PO TABS: 1000.0000 mg | ORAL_TABLET | Freq: Two times a day (BID) | ORAL | Status: DC

## 2019-09-27 MED ORDER — CLOPIDOGREL BISULFATE 75 MG PO TABS
ORAL_TABLET | ORAL | Status: DC | PRN
  Administered 2019-09-27: 600 mg via ORAL

## 2019-09-27 MED ORDER — HEPARIN (PORCINE) IN NACL 1000-0.9 UT/500ML-% IV SOLN
INTRAVENOUS | Status: AC
  Filled 2019-09-27: qty 1000

## 2019-09-27 MED ORDER — SODIUM CHLORIDE 0.9% FLUSH: 3.0000 mL | INTRAVENOUS | Status: DC | PRN

## 2019-09-27 MED ORDER — FENTANYL CITRATE (PF) 100 MCG/2ML IJ SOLN
INTRAMUSCULAR | Status: AC
  Filled 2019-09-27: qty 2

## 2019-09-27 MED ORDER — SODIUM CHLORIDE 0.9 % WEIGHT BASED INFUSION
3.0000 mL/kg/h | INTRAVENOUS | Status: DC
  Administered 2019-09-27: 08:00:00 3 mL/kg/h via INTRAVENOUS

## 2019-09-27 MED ORDER — FENTANYL CITRATE (PF) 100 MCG/2ML IJ SOLN
INTRAMUSCULAR | Status: DC | PRN
  Administered 2019-09-27: 50 ug via INTRAVENOUS

## 2019-09-27 MED ORDER — TAMSULOSIN HCL 0.4 MG PO CAPS
0.4000 mg | ORAL_CAPSULE | Freq: Every day | ORAL | Status: DC
  Administered 2019-09-27 – 2019-09-28 (×2): 0.4 mg via ORAL
  Filled 2019-09-27 (×2): qty 1

## 2019-09-27 MED ORDER — SODIUM CHLORIDE 0.9 % WEIGHT BASED INFUSION
1.0000 mL/kg/h | INTRAVENOUS | Status: DC
  Administered 2019-09-27: 1 mL/kg/h via INTRAVENOUS

## 2019-09-27 MED ORDER — HEPARIN SODIUM (PORCINE) 1000 UNIT/ML IJ SOLN
INTRAMUSCULAR | Status: DC | PRN
  Administered 2019-09-27: 10000 [IU] via INTRAVENOUS

## 2019-09-27 MED ORDER — CANAGLIFLOZIN 300 MG PO TABS: 300.0000 mg | ORAL_TABLET | Freq: Every day | ORAL | Status: DC

## 2019-09-27 MED ORDER — SODIUM CHLORIDE 0.9 % WEIGHT BASED INFUSION
1.0000 mL/kg/h | INTRAVENOUS | Status: AC
  Administered 2019-09-27 (×2): 1 mL/kg/h via INTRAVENOUS

## 2019-09-27 MED ORDER — IOHEXOL 300 MG/ML  SOLN
INTRAMUSCULAR | Status: DC | PRN
  Administered 2019-09-27: 12:00:00 30 mL via INTRA_ARTERIAL

## 2019-09-27 MED ORDER — IOHEXOL 300 MG/ML  SOLN
INTRAMUSCULAR | Status: DC | PRN
  Administered 2019-09-27: 10:00:00 120 mL

## 2019-09-27 MED ORDER — MIDAZOLAM HCL 2 MG/2ML IJ SOLN
INTRAMUSCULAR | Status: AC
  Filled 2019-09-27: qty 2

## 2019-09-27 MED ORDER — SODIUM CHLORIDE 0.9% FLUSH
3.0000 mL | Freq: Two times a day (BID) | INTRAVENOUS | Status: DC
  Administered 2019-09-27 – 2019-09-28 (×2): 3 mL via INTRAVENOUS

## 2019-09-27 MED ORDER — ISOSORBIDE MONONITRATE ER 30 MG PO TB24
30.0000 mg | ORAL_TABLET | Freq: Every day | ORAL | Status: DC
  Filled 2019-09-27: qty 1

## 2019-09-27 MED ORDER — SODIUM CHLORIDE 0.9 % IV SOLN: 250.0000 mL | INTRAVENOUS | Status: DC | PRN

## 2019-09-27 MED ORDER — HEPARIN (PORCINE) IN NACL 1000-0.9 UT/500ML-% IV SOLN
INTRAVENOUS | Status: DC | PRN
  Administered 2019-09-27: 500 mL

## 2019-09-27 MED ORDER — FUROSEMIDE 40 MG PO TABS: 40.0000 mg | ORAL_TABLET | Freq: Every day | ORAL | Status: DC | PRN

## 2019-09-27 MED ORDER — MIDAZOLAM HCL 2 MG/2ML IJ SOLN
INTRAMUSCULAR | Status: DC | PRN
  Administered 2019-09-27: 1 mg via INTRAVENOUS

## 2019-09-27 MED ORDER — FENTANYL CITRATE (PF) 100 MCG/2ML IJ SOLN
INTRAMUSCULAR | Status: DC | PRN
  Administered 2019-09-27 (×2): 50 ug via INTRAVENOUS

## 2019-09-27 MED ORDER — NITROGLYCERIN 0.4 MG SL SUBL: 0.4000 mg | SUBLINGUAL_TABLET | SUBLINGUAL | Status: DC | PRN

## 2019-09-27 MED ORDER — POTASSIUM CHLORIDE CRYS ER 20 MEQ PO TBCR: 20.0000 meq | EXTENDED_RELEASE_TABLET | Freq: Two times a day (BID) | ORAL | Status: DC | PRN

## 2019-09-27 MED ORDER — PIOGLITAZONE HCL 30 MG PO TABS
30.0000 mg | ORAL_TABLET | Freq: Every day | ORAL | Status: DC
  Administered 2019-09-27 – 2019-09-28 (×2): 30 mg via ORAL
  Filled 2019-09-27 (×2): qty 1

## 2019-09-27 MED ORDER — LISINOPRIL 10 MG PO TABS
20.0000 mg | ORAL_TABLET | Freq: Every day | ORAL | Status: DC
  Administered 2019-09-27 – 2019-09-28 (×2): 20 mg via ORAL
  Filled 2019-09-27 (×3): qty 2

## 2019-09-27 MED ORDER — CLOPIDOGREL BISULFATE 75 MG PO TABS
75.0000 mg | ORAL_TABLET | Freq: Every day | ORAL | Status: DC
  Administered 2019-09-28: 10:00:00 75 mg via ORAL
  Filled 2019-09-27: qty 1

## 2019-09-27 MED ORDER — HYDROCODONE-ACETAMINOPHEN 5-325 MG PO TABS
1.0000 | ORAL_TABLET | Freq: Two times a day (BID) | ORAL | Status: DC | PRN
  Administered 2019-09-27 – 2019-09-28 (×2): 1 via ORAL
  Filled 2019-09-27 (×2): qty 1

## 2019-09-27 MED ORDER — HEPARIN SODIUM (PORCINE) 1000 UNIT/ML IJ SOLN
INTRAMUSCULAR | Status: AC
  Filled 2019-09-27: qty 1

## 2019-09-27 MED ORDER — SODIUM CHLORIDE 0.9% FLUSH
3.0000 mL | Freq: Two times a day (BID) | INTRAVENOUS | Status: DC
  Administered 2019-09-28: 10:00:00 3 mL via INTRAVENOUS

## 2019-09-27 MED ORDER — ACETAMINOPHEN 325 MG PO TABS: 650.0000 mg | ORAL_TABLET | ORAL | Status: DC | PRN

## 2019-09-27 MED ORDER — ASPIRIN EC 81 MG PO TBEC
81.0000 mg | DELAYED_RELEASE_TABLET | Freq: Every day | ORAL | Status: DC
  Administered 2019-09-28: 81 mg via ORAL
  Filled 2019-09-27 (×2): qty 1

## 2019-09-27 MED ORDER — ASPIRIN 81 MG PO CHEW
CHEWABLE_TABLET | ORAL | Status: AC
  Administered 2019-09-27: 08:00:00 81 mg via ORAL
  Filled 2019-09-27: qty 1

## 2019-09-27 MED ORDER — INSULIN ASPART 100 UNIT/ML ~~LOC~~ SOLN
0.0000 [IU] | Freq: Three times a day (TID) | SUBCUTANEOUS | Status: DC
  Administered 2019-09-27: 17:00:00 8 [IU] via SUBCUTANEOUS
  Filled 2019-09-27 (×2): qty 1

## 2019-09-27 MED ORDER — ONDANSETRON HCL 4 MG/2ML IJ SOLN: 4.0000 mg | Freq: Four times a day (QID) | INTRAMUSCULAR | Status: DC | PRN

## 2019-09-27 MED ORDER — CLOPIDOGREL BISULFATE 75 MG PO TABS
ORAL_TABLET | ORAL | Status: AC
  Filled 2019-09-27: qty 1

## 2019-09-27 SURGICAL SUPPLY — 26 items
BALLN TREK RX 2.5X15 (BALLOONS) ×4
BALLN ~~LOC~~ EUPHORA RX 4.0X20 (BALLOONS) ×4
BALLN ~~LOC~~ TREK RX 3.5X20 (BALLOONS) ×4
BALLOON TREK RX 2.5X15 (BALLOONS) ×2 IMPLANT
BALLOON ~~LOC~~ EUPHORA RX 4.0X20 (BALLOONS) ×2 IMPLANT
BALLOON ~~LOC~~ TREK RX 3.5X20 (BALLOONS) ×2 IMPLANT
CATH INFINITI 5 FR IM (CATHETERS) ×4 IMPLANT
CATH INFINITI 5 FR JR5 (CATHETERS) ×4 IMPLANT
CATH INFINITI 5FR ANG PIGTAIL (CATHETERS) ×4 IMPLANT
CATH INFINITI 5FR JL4 (CATHETERS) ×4 IMPLANT
CATH INFINITI 5FR JL5 (CATHETERS) ×4 IMPLANT
CATH INFINITI JR4 5F (CATHETERS) ×4 IMPLANT
CATH LAUNCHER 6FR JR4 (CATHETERS) ×4 IMPLANT
CATH VISTA GUIDE 6FR XB3.5 (CATHETERS) ×4 IMPLANT
DEVICE CLOSURE MYNXGRIP 6/7F (Vascular Products) ×4 IMPLANT
DEVICE INFLAT 30 PLUS (MISCELLANEOUS) ×4 IMPLANT
KIT MANI 3VAL PERCEP (MISCELLANEOUS) ×4 IMPLANT
NEEDLE PERC 18GX7CM (NEEDLE) ×4 IMPLANT
PACK CARDIAC CATH (CUSTOM PROCEDURE TRAY) ×4 IMPLANT
SHEATH AVANTI 5FR X 11CM (SHEATH) ×4 IMPLANT
SHEATH AVANTI 6FR X 11CM (SHEATH) ×4 IMPLANT
STENT RESOLUTE ONYX 3.0X26 (Permanent Stent) ×4 IMPLANT
WIRE EMERALD 3MM-J .035X260CM (WIRE) ×4 IMPLANT
WIRE GUIDERIGHT .035X150 (WIRE) ×4 IMPLANT
WIRE PRESSURE VERRATA (WIRE) ×4 IMPLANT
WIRE RUNTHROUGH .014X180CM (WIRE) ×4 IMPLANT

## 2019-09-27 NOTE — Discharge Summary (Signed)
Discharge Summary    Patient ID: [name] [name] MRN: [id number]; DOB: 1951-11-14  Admit date: 09/27/2019 Discharge date: 09/28/2019  Primary Care Provider: Venia Carbon, MD  Primary Cardiologist: Ida Rogue, MD  Primary Electrophysiologist:  None   Discharge Diagnoses    Principal Problem:   Unstable angina [facility]) Active Problems:   Hx of CABG   Essential hypertension   Type II diabetes mellitus ([place])   Hyperlipidemia   BPH with obstruction/lower urinary tract symptoms   Statin intolerance   Chronic pain syndrome   Allergies Allergies  Allergen Reactions  . Crestor [Rosuvastatin] Other (See Comments)    Muscle aches.  . Doxazosin Mesylate Other (See Comments)    REACTION: didn't work and may have caused SOB  . Doxycycline Other (See Comments)    REACTION: unspecified  . Glimepiride Other (See Comments)    REACTION: red eyes  . Glipizide Other (See Comments)    REACTION: myalgia??  . Losartan Potassium-Hctz Other (See Comments)    REACTION: chest \\T \ leg pain  . Metoprolol Tartrate Other (See Comments)    Mood swings, memory changes  . Pravastatin Other (See Comments)    Muscle aches   . Rosiglitazone Maleate Other (See Comments)    REACTION: myalgia  . Simvastatin Other (See Comments)    REACTION: myalgias  . Tetracyclines & Related   . Carvedilol Other (See Comments)    Felt bad    Diagnostic Studies/Procedures    Cardiac Catheterization and Percutaneous Coronary Intervention 10.21.2020  Left Main  Ost LM to Mid LM lesion 50% stenosed  Ost LM to Mid LM lesion is 50% stenosed.  Left Anterior Descending  Vessel is small. There is severe diffuse disease throughout the vessel.  Ost LAD to Prox LAD lesion 100% stenosed  Ost LAD to Prox LAD lesion is 100% stenosed. The lesion is chronically occluded.  First Diagonal Branch  There is severe disease in the vessel.  Second Diagonal Branch  Vessel is small in size. There is severe disease in  the vessel.  Third Diagonal Branch  Vessel is small in size. There is severe disease in the vessel.  Left Circumflex  First Obtuse Marginal Branch  1st Mrg lesion 90% stenosed  1st Mrg lesion is 90% stenosed.  Third Obtuse Marginal Branch  3rd Mrg lesion 40% stenosed  3rd Mrg lesion is 40% stenosed.  Right Coronary Artery  Ost RCA to Prox RCA lesion 70% stenosed  Ost RCA to Prox RCA lesion is 70% stenosed.      **The IFR was abnormal @ 0.88  s/p PCI/DES w/ 3.0 x 85mm Resolute Onyx drug-eluting stent.  Mid RCA lesion 50% stenosed  Mid RCA lesion is 50% stenosed.  LIMA Graft to Mid LAD  Sequential Graft to 1st Diag, 3rd Diag  The left ventricular size is normal. The left ventricular systolic function is normal. LV end diastolic pressure is normal. The left ventricular ejection fraction is 50-55% by visual estimate. No regional wall motion abnormalities. There is no evidence of mitral regurgitation.  _____________   History of Present Illness     [name] [name] is a 68 y.o. male with a history of CAD status post three-vessel bypass in December 2012, hypertension, hyperlipidemia, statin intolerance, type 2 diabetes mellitus, BPH, chronic pain, and sleep apnea on CPAP.  He was recently evaluated in outpatient clinic with a 6-week history of intermittent nocturnal chest pressure, which he identified as being similar to what he experienced prior to his bypass.  He also noted some decrease in activity tolerance.  Given symptoms concerning for unstable angina, decision was made to pursue diagnostic catheterization.  Hospital Course     Consultants: None  Patient presented to the [facility] cardiac catheterization laboratory on September 27, 2019 diagnostic catheterization was performed and revealed moderate ostial left main disease with known occlusion of the LAD, patent LIMA to the LAD, patent sequential vein graft to the D1 and D3, 90% stenosis in the OM1, and a 70%  stenosis in the ostial/proximal RCA.  Films were reviewed by interventional cardiology and iFR was performed within the RCA and was abnormal at 0.88.  Therefore, the ostial/proximal RCA was successfully treated with a 3.0 x 26 mm resolute Onyx drug-eluting stent.  It was felt that patient would benefit from left main and OM1 intervention however, given contrast load and fluoroscopy time up to that point, it was felt that this would be better performed in a staged fashion.  Post procedure, [name] [name] has been ambulating without recurrent symptoms or limitations.  He will be discharged home today in good condition and he has follow-up arranged for October 27.  At that time, we will plan to repeat a CBC and basic metabolic panel as well as set him up for repeat COVID-19 testing, as he is already scheduled for staged PCI of the left main and OM1 on November 4 at [facility] with Dr. Fletcher Anon.  Of note, we did discuss the importance of secondary prevention including cholesterol management.  Patient has previously noted intolerance to statins.  He was offered Zetia and/or PCSK9 inhibitor therapy but has previously deferred.  We will need to readdress this as an outpatient and also consider bempedoic acid/ezetimibe if PCSK9 inhibitor therapy is cost prohibitive for him.  Did the patient have an acute coronary syndrome (MI, NSTEMI, STEMI, etc) this admission?:  No                               Did the patient have a percutaneous coronary intervention (stent / angioplasty)?:  Yes.     Cath/PCI Registry Performance & Quality Measures: 1. Aspirin prescribed? - Yes 2. ADP Receptor Inhibitor (Plavix/Clopidogrel, Brilinta/Ticagrelor or Effient/Prasugrel) prescribed (includes medically managed patients)? - Yes 3. High Intensity Statin (Lipitor 40-80mg  or Crestor 20-40mg ) prescribed? - No - Intolerant 4. For EF <40%, was ACEI/ARB prescribed? - Not Applicable (EF >/= [id number]) 5. For EF <40%, Aldosterone Antagonist  (Spironolactone or Eplerenone) prescribed? - Not Applicable (EF >/= [id number]) 6. Cardiac Rehab Phase II ordered (Included Medically managed Patients)? - Yes  _____________  Discharge Physical Exam     Discharge Vitals Blood pressure 123/72, pulse 68, temperature 97.9 F (36.6 C), temperature source Oral, resp. rate 19, height 5\' 4"  (1.626 m), weight 95.7 kg, SpO2 97 %.  Filed Weights   09/27/19 0802 09/27/19 1415 09/28/19 0353  Weight: 95.7 kg 94.9 kg 95.7 kg   GEN: Well nourished, well developed, in no acute distress.  HEENT: Grossly normal.  Neck: Supple, no JVD, carotid bruits, or masses. Cardiac: RRR, no murmurs, rubs, or gallops. No clubbing, cyanosis, edema.  Radials/DP/PT 2+ and equal bilaterally. R groin w/o bleeding, bruit, hematoma. Respiratory:  Respirations regular and unlabored, clear to auscultation bilaterally. GI: Soft, nontender, nondistended, BS + x 4. MS: no deformity or atrophy. Skin: warm and dry, no rash. Neuro:  Strength and sensation are intact. Psych: AAOx3.  Normal affect.  Labs &  Radiologic Studies    CBC Recent Labs    09/28/19 0618  WBC 8.7  HGB 14.1  HCT 43.2  MCV 89.4  PLT 123456   Basic Metabolic Panel Recent Labs    09/28/19 0618  NA 137  K 3.8  CL 105  CO2 24  GLUCOSE 158*  BUN 20  CREATININE 0.84  CALCIUM 8.4*   Disposition   Pt is being discharged home today in good condition.  Follow-up Plans & Appointments    Follow-up Information    Rise Mu, PA-C Follow up on 10/02/2019.   Specialties: Physician Assistant, Cardiology, Radiology Why: 11:00 AM Contact information: [address] [place] [address] [phone]        [facility] Follow up on 10/10/2019.   Why: Present to short-stay @ [place] @ 10:30 AM for a 12:30 PM case. Contact information: [place] [id number] [phone]         Discharge Instructions    AMB Referral to Phase II  Cardiac Rehab   Complete by: As directed    Diagnosis: Coronary Stents   After initial evaluation and assessments completed: Virtual Based Care may be provided alone or in conjunction with Phase 2 Cardiac Rehab based on patient barriers.: Yes   Amb Referral to Cardiac Rehabilitation   Complete by: As directed    Patient will need staging PCI in one to two weeks.  Patient should wait to start Cardiac Rehab until after staging PCI completed.   Diagnosis: Coronary Stents   After initial evaluation and assessments completed: Virtual Based Care may be provided alone or in conjunction with Phase 2 Cardiac Rehab based on patient barriers.: Yes   Call MD for:  difficulty breathing, headache or visual disturbances   Complete by: As directed    Call MD for:  redness, tenderness, or signs of infection (pain, swelling, redness, odor or green/yellow discharge around incision site)   Complete by: As directed    Call MD for:  temperature >100.4   Complete by: As directed    Diet - low sodium heart healthy   Complete by: As directed    Increase activity slowly   Complete by: As directed       Discharge Medications   Allergies as of 09/28/2019      Reactions   Crestor [rosuvastatin] Other (See Comments)   Muscle aches.   Doxazosin Mesylate Other (See Comments)   REACTION: didn't work and may have caused SOB   Doxycycline Other (See Comments)   REACTION: unspecified   Glimepiride Other (See Comments)   REACTION: red eyes   Glipizide Other (See Comments)   REACTION: myalgia??   Losartan Potassium-hctz Other (See Comments)   REACTION: chest \\T \ leg pain   Metoprolol Tartrate Other (See Comments)   Mood swings, memory changes   Pravastatin Other (See Comments)   Muscle aches   Rosiglitazone Maleate Other (See Comments)   REACTION: myalgia   Simvastatin Other (See Comments)   REACTION: myalgias   Tetracyclines & Related    Carvedilol Other (See Comments)   Felt bad      Medication List     STOP taking these medications   isosorbide mononitrate 30 MG 24 hr tablet Commonly known as: IMDUR     TAKE these medications   aspirin EC 81 MG tablet Take 1 tablet (81 mg total) by mouth daily.   canagliflozin 300 MG Tabs tablet Commonly known as:  Invokana Take 1 tablet (300 mg total) by mouth daily before breakfast.   clopidogrel 75 MG tablet Commonly known as: PLAVIX Take 1 tablet (75 mg total) by mouth daily with breakfast.   furosemide 40 MG tablet Commonly known as: LASIX Take 1 tablet (40 mg total) by mouth daily as needed. What changed: reasons to take this   glucose blood test strip Commonly known as: FREESTYLE LITE Use as instructed, patient tests once daily. Dx: 250.00   HYDROcodone-acetaminophen 5-325 MG tablet Commonly known as: NORCO/VICODIN TAKE 1 TABLET BY MOUTH 2 TIMES DAILY What changed:   how much to take  how to take this  when to take this  reasons to take this  additional instructions   lisinopril 20 MG tablet Commonly known as: ZESTRIL Take 1 tablet (20 mg total) by mouth daily.   metFORMIN 1000 MG tablet Commonly known as: GLUCOPHAGE Take 1 tablet (1,000 mg total) by mouth 2 (two) times daily with a meal.   nitroGLYCERIN 0.4 MG SL tablet Commonly known as: NITROSTAT Place 1 tablet (0.4 mg total) under the tongue every 5 (five) minutes as needed for chest pain.   pioglitazone 30 MG tablet Commonly known as: ACTOS TAKE 1 TABLET DAILY   potassium chloride SA 20 MEQ tablet Commonly known as: KLOR-CON Take 1 tablet (20 mEq total) by mouth 2 (two) times daily as needed (Take with lasix).   tamsulosin 0.4 MG Caps capsule Commonly known as: FLOMAX Take 1 capsule (0.4 mg total) by mouth daily.   triamcinolone cream 0.1 % Commonly known as: KENALOG Apply 1 application topically 2 (two) times daily as needed. What changed: reasons to take this          Outstanding Labs/Studies   Will need f/u labs 10/27 and repeat COVID  testing pending PCI 11/4 (pre-cath orders already placed).  Duration of Discharge Encounter   Greater than 30 minutes including physician time.  Signed, Murray Hodgkins, NP 09/28/2019, 9:07 AM

## 2019-09-27 NOTE — H&P (Signed)
H&P Addendum, precardiac catheterization  Patient was seen and evaluated prior to Cardiac catheterization procedure Symptoms, prior testing details again confirmed with the patient Patient examined, no significant change from prior exam Lab work reviewed in detail personally by myself Patient understands risk and benefit of the procedure, willing to proceed  Signed, Tim Beverley Sherrard, MD, Ph.D [facility]    

## 2019-09-28 ENCOUNTER — Other Ambulatory Visit: Payer: Self-pay | Admitting: Nurse Practitioner

## 2019-09-28 DIAGNOSIS — Z7982 Long term (current) use of aspirin: Secondary | ICD-10-CM | POA: Diagnosis not present

## 2019-09-28 DIAGNOSIS — I25119 Atherosclerotic heart disease of native coronary artery with unspecified angina pectoris: Secondary | ICD-10-CM | POA: Diagnosis not present

## 2019-09-28 DIAGNOSIS — N138 Other obstructive and reflux uropathy: Secondary | ICD-10-CM | POA: Diagnosis not present

## 2019-09-28 DIAGNOSIS — I25110 Atherosclerotic heart disease of native coronary artery with unstable angina pectoris: Secondary | ICD-10-CM | POA: Diagnosis not present

## 2019-09-28 DIAGNOSIS — G4733 Obstructive sleep apnea (adult) (pediatric): Secondary | ICD-10-CM | POA: Diagnosis not present

## 2019-09-28 DIAGNOSIS — Z7902 Long term (current) use of antithrombotics/antiplatelets: Secondary | ICD-10-CM | POA: Diagnosis not present

## 2019-09-28 DIAGNOSIS — Z951 Presence of aortocoronary bypass graft: Secondary | ICD-10-CM | POA: Diagnosis not present

## 2019-09-28 DIAGNOSIS — E1159 Type 2 diabetes mellitus with other circulatory complications: Secondary | ICD-10-CM

## 2019-09-28 DIAGNOSIS — G894 Chronic pain syndrome: Secondary | ICD-10-CM | POA: Diagnosis not present

## 2019-09-28 DIAGNOSIS — Z881 Allergy status to other antibiotic agents status: Secondary | ICD-10-CM | POA: Diagnosis not present

## 2019-09-28 DIAGNOSIS — E785 Hyperlipidemia, unspecified: Secondary | ICD-10-CM | POA: Diagnosis not present

## 2019-09-28 DIAGNOSIS — I10 Essential (primary) hypertension: Secondary | ICD-10-CM

## 2019-09-28 DIAGNOSIS — E782 Mixed hyperlipidemia: Secondary | ICD-10-CM

## 2019-09-28 DIAGNOSIS — Z79899 Other long term (current) drug therapy: Secondary | ICD-10-CM | POA: Diagnosis not present

## 2019-09-28 DIAGNOSIS — Z955 Presence of coronary angioplasty implant and graft: Secondary | ICD-10-CM | POA: Diagnosis not present

## 2019-09-28 DIAGNOSIS — Z7984 Long term (current) use of oral hypoglycemic drugs: Secondary | ICD-10-CM | POA: Diagnosis not present

## 2019-09-28 DIAGNOSIS — N401 Enlarged prostate with lower urinary tract symptoms: Secondary | ICD-10-CM | POA: Diagnosis not present

## 2019-09-28 DIAGNOSIS — E119 Type 2 diabetes mellitus without complications: Secondary | ICD-10-CM | POA: Diagnosis not present

## 2019-09-28 LAB — GLUCOSE, CAPILLARY: Glucose-Capillary: 142 mg/dL — ABNORMAL HIGH (ref 70–99)

## 2019-09-28 LAB — BASIC METABOLIC PANEL
Anion gap: 8 (ref 5–15)
BUN: 20 mg/dL (ref 8–23)
CO2: 24 mmol/L (ref 22–32)
Calcium: 8.4 mg/dL — ABNORMAL LOW (ref 8.9–10.3)
Chloride: 105 mmol/L (ref 98–111)
Creatinine, Ser: 0.84 mg/dL (ref 0.61–1.24)
GFR calc Af Amer: >60 mL/min (ref >60)
GFR calc non Af Amer: >60 mL/min (ref >60)
Glucose, Bld: 158 mg/dL — ABNORMAL HIGH (ref 70–99)
Potassium: 3.8 mmol/L (ref 3.5–5.1)
Sodium: 137 mmol/L (ref 135–145)

## 2019-09-28 LAB — CBC
HCT: 43.2 % (ref 39.0–52.0)
Hemoglobin: 14.1 g/dL (ref 13.0–17.0)
MCH: 29.2 pg (ref 26.0–34.0)
MCHC: 32.6 g/dL (ref 30.0–36.0)
MCV: 89.4 fL (ref 80.0–100.0)
Platelets: 201 10*3/uL (ref 150–400)
RBC: 4.83 MIL/uL (ref 4.22–5.81)
RDW: 13.6 % (ref 11.5–15.5)
WBC: 8.7 10*3/uL (ref 4.0–10.5)
nRBC: 0 % (ref 0.0–0.2)

## 2019-09-28 MED ORDER — CLOPIDOGREL BISULFATE 75 MG PO TABS: 75.0000 mg | ORAL_TABLET | Freq: Every day | ORAL | 6 refills | Status: DC

## 2019-09-28 NOTE — Progress Notes (Signed)
Cardiovascular and Pulmonary Nurse Navigator Note:    68 y.o. male with known coronary artery disease, bypass surgery 2012 with LIMA to the LAD, reversed vein graft sequential to D1 and D2 Hyperlipidemia, diabetes type 2 Seen in the cardiology clinic with unstable angina symptoms, taking more nitroglycerin, worse in the evenings Patient presented for scheduled outpatient cardiac catheterization for unstable angina symptoms with known coronary artery disease with history of CABG --Yesterday with stent placed to proximal RCA.   Residual disease significant left main, OM disease to be staged for intervention in 1 to 2 weeks.    EDUCATION:     As above, CAD is not a new diagnosis for this patient.    Rounded on patient to discuss Cardiac Rehab with patient. Informed patient his cardiologist has been referred to outpatient Cardiac Rehab at [facility].   Patient informed this RN that he participated in Cardiac Rehab in the past for a few sessions, but did not complete the program.  Overview of the program provided.  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  Patient declined participation in Cardiac Rehab. Referral closed.    Informed patient he will given a stent card prior to discharge.  Purpose of stent card explained to patient.  Instructed to keep stent card in his wallet.  ? Discussed modifiable risk factors including controlling blood pressure, cholesterol, and blood sugar; following heart healthy diet; maintaining healthy weight; exercise; and smoking cessation, if applicable.   ? Discussed cardiac medications including rationale for taking, mechanisms of action, and side effects. Stressed the importance of taking medications as prescribed.  ? Discussed emergency plan for heart attack symptoms. Patient verbalized understanding of need to call 911 and not to drive himself to ER if having cardiac symptoms / chest pain. Use of Nitroglyerin SL  discussed.    ? Diet of low sodium, low fat, low cholesterol heart healthy / carb modified diet discussed.   ? Smoking Cessation - Patient is a NEVER smoker.    Patient appreciative of the information.  ? Roanna Epley, RN, BSN, [place]  [facility] Cardiac & Pulmonary Rehab  Cardiovascular & Pulmonary Nurse Navigator  Direct Line: [phone]  Department Phone #: [phone] Fax: [phone]  Email Address: [email or web address]

## 2019-09-28 NOTE — Discharge Instructions (Signed)
**PLEASE REMEMBER TO BRING ALL OF YOUR MEDICATIONS TO EACH OF YOUR FOLLOW-UP OFFICE VISITS.  ° °NO HEAVY LIFTING OR SEXUAL ACTIVITY X 7 DAYS. °NO DRIVING X 3-5 DAYS. °NO SOAKING BATHS, HOT TUBS, POOLS, ETC., X 7 DAYS. ° °Groin Site Care °Refer to this sheet in the next few weeks. These instructions provide you with information on caring for yourself after your procedure. Your caregiver may also give you more specific instructions. Your treatment has been planned according to current medical practices, but problems sometimes occur. Call your caregiver if you have any problems or questions after your procedure. °HOME CARE INSTRUCTIONS °· You may shower 24 hours after the procedure. Remove the bandage (dressing) and gently wash the site with plain soap and water. Gently pat the site dry.  °· Do not apply powder or lotion to the site.  °· Do not sit in a bathtub, swimming pool, or whirlpool for 5 to 7 days.  °· No bending, squatting, or lifting anything over 10 pounds (4.5 kg) as directed by your caregiver.  °· Inspect the site at least twice daily.  °· Do not drive home if you are discharged the same day of the procedure. Have someone else drive you.  ° °What to expect: °· Any bruising will usually fade within 1 to 2 weeks.  °· Blood that collects in the tissue (hematoma) may be painful to the touch. It should usually decrease in size and tenderness within 1 to 2 weeks.  °SEEK IMMEDIATE MEDICAL CARE IF: °· You have unusual pain at the groin site or down the affected leg.  °· You have redness, warmth, swelling, or pain at the groin site.  °· You have drainage (other than a small amount of blood on the dressing).  °· You have chills.  °· You have a fever or persistent symptoms for more than 72 hours.  °· You have a fever and your symptoms suddenly get worse.  °· Your leg becomes pale, cool, tingly, or numb.  °You have heavy bleeding from the site. Hold pressure on the site. . °_____________ ° °  ° °10 Habits of Highly  Healthy People ° °[place] wants to help you get well and stay well.  Live a longer, healthier life by practicing healthy habits every day. ° °1.  Visit your primary care provider regularly. °2.  Make time for family and friends.  Healthy relationships are important. °3.  Take medications as directed by your provider. °4.  Maintain a healthy weight and a trim waistline. °5.  Eat healthy meals and snacks, rich in fruits, vegetables, whole grains, and lean proteins. °6.  Get moving every day - aim for 150 minutes of moderate physical activity each week. °7.  Don't smoke. °8.  Avoid alcohol or drink in moderation. °9.  Manage stress through meditation or mindful relaxation. °10.  Get seven to nine hours of quality sleep each night. ° °Want more information on healthy habits?  To learn more about these and other healthy habits, visit [email or web address]. °_____________ °  °   ° °You have received care from [facility] during this hospital stay and we look forward to continuing to provide you with excellent care in our office settings after you've left the hospital.  In order to assure a smoother transition to home following your discharge from the hospital, we will likely have you see one of our nurse practitioners or physician assistants within a few weeks of discharge.  Our advanced   practice providers work closely with your physician in order to address all of your heart's needs in a timely manner.  More information about all of our providers may be found here: [email or web address] ° °Please plan to bring all of your prescriptions to your follow-up appointment and don't hesitate to contact us with questions or concerns. ° °[facility] - [phone] °[facility] - [phone] °[facility] [address] - [phone] °[facility] - [phone] °[facility] - [phone] °[facility] - [phone] °[facility] - [phone] °[facility] - [phone] °[facility] [place] - [phone]  °

## 2019-09-28 NOTE — Progress Notes (Signed)
Pt discharged home via private car at 1135. Pt was A&Ox4. AVS reviewed with pt and all questions were answered. Additional education on site care and new plavix prescription completed. Pt left with clothes, shoes, and cell phone. Pt wheeled downstairs by CNA.

## 2019-09-28 NOTE — Progress Notes (Signed)
Progress Note  Patient Name: [name] [name] Date of Encounter: 09/28/2019  Primary Cardiologist: Ida Rogue, MD   Subjective   No chest pain overnight Reports no significant tenderness right groin catheterization site Reports he is ambulated short distances Reports he continues to have some mild shortness of breath Cardiac catheterization results and procedure discussed with him from yesterday, stent to ostial/proximal RCA Residual disease left main, OM vessel  Inpatient Medications    Scheduled Meds: . aspirin EC  81 mg Oral Daily  . clopidogrel  75 mg Oral Q breakfast  . insulin aspart  0-15 Units Subcutaneous TID WC  . isosorbide mononitrate  30 mg Oral Daily  . lisinopril  20 mg Oral Daily  . pioglitazone  30 mg Oral Daily  . sodium chloride flush  3 mL Intravenous Q12H  . sodium chloride flush  3 mL Intravenous Q12H  . tamsulosin  0.4 mg Oral Daily   Continuous Infusions: . sodium chloride     PRN Meds: sodium chloride, acetaminophen, HYDROcodone-acetaminophen, nitroGLYCERIN, ondansetron (ZOFRAN) IV, sodium chloride flush   Vital Signs    Vitals:   09/27/19 1649 09/27/19 1956 09/28/19 0353 09/28/19 0739  BP: 123/69 120/68 125/73 123/72  Pulse: 71 68 71 68  Resp:  20 20 19   Temp: 97.9 F (36.6 C) 98.2 F (36.8 C) 98.7 F (37.1 C) 97.9 F (36.6 C)  TempSrc: Oral Oral Oral Oral  SpO2: 97% 97% 97% 97%  Weight:   95.7 kg   Height:        Intake/Output Summary (Last 24 hours) at 09/28/2019 1204 Last data filed at 09/28/2019 1015 Gross per 24 hour  Intake 737.26 ml  Output 2700 ml  Net -1962.74 ml   Last 3 Weights 09/28/2019 09/27/2019 09/27/2019  Weight (lbs) 210 lb 14.4 oz 209 lb 3.2 oz 211 lb  Weight (kg) 95.664 kg 94.892 kg 95.709 kg      Telemetry    Normal sinus rhythm- Personally Reviewed  ECG     - Personally Reviewed  Physical Exam   GEN: No acute distress.   Neck: No JVD Cardiac: RRR, no murmurs, rubs, or gallops.   Respiratory: Clear to auscultation bilaterally. GI: Soft, nontender, non-distended  MS: No edema; No deformity. Neuro:  Nonfocal  Psych: Normal affect   Labs    High Sensitivity Troponin:  No results for input(s): TROPONINIHS in the last 720 hours.    Chemistry Recent Labs  Lab 09/28/19 0618  NA 137  K 3.8  CL 105  CO2 24  GLUCOSE 158*  BUN 20  CREATININE 0.84  CALCIUM 8.4*  GFRNONAA >60  GFRAA >60  ANIONGAP 8     Hematology Recent Labs  Lab 09/28/19 0618  WBC 8.7  RBC 4.83  HGB 14.1  HCT 43.2  MCV 89.4  MCH 29.2  MCHC 32.6  RDW 13.6  PLT 201    BNPNo results for input(s): BNP, PROBNP in the last 168 hours.   DDimer No results for input(s): DDIMER in the last 168 hours.   Radiology    No results found.  Cardiac Studies   Catheterization Final Conclusions:   Severe native LAD disease including diagonal vessels LIMA graft and vein graft are patent -Severe proximal OM1 disease, At least moderate proximal and mid RCA disease  Recommendations:  Case discussed with interventional cardiology We will FFR of the RCA given two significant lesions proximal and mid Consider intervention of the proximal OM1 (proximal RCA if indicted by FFR)  Successful angioplasty and drug-eluting stent placement to the proximal right coronary artery.  This was significant by IFR at 0.88.  The lesion in the distal RCA was not significant by IFR.  Recommendations: Continue dual antiplatelet therapy for at least 6 months. Aggressive treatment of risk factors. I was planning to perform PCI in OM1 at the same session.  However, with guide catheter engagement of the left main, there was pressure dampening and the left main stenosis appeared to be significant.  This will require a more complex procedure and its best to stage this.  The patient will require protected left main PCI in addition to [place] PCI.  This can be scheduled in 1 to 2 weeks.   Patient Profile     68 y.o. male  with Known coronary artery disease, bypass surgery 2012 with LIMA to the LAD, reversed vein graft sequential to D1 and D2 Hyperlipidemia, diabetes type 2 Seen in the clinic with unstable angina symptoms, taking more nitroglycerin, worse in the evenings Scheduled for cardiac catheterization for unstable angina symptoms known coronary artery disease history of CABG --Yesterday with stent placed to proximal RCA Residual disease significant left main, OM disease to be staged  Assessment & Plan    CAD with stable angina Discussed cardiac catheterization films and procedure with him from yesterday -Still with residual significant disease of left main , severe proximal OM moderate size vessel, likely causing some of his symptoms of chest discomfort,/unstable angina, shortness of breath --Given dye load, radiation time, unable to fix yesterday Will need to be staged and given likely need for left main stent this will be arranged to be done in [place] --The above was discussed with him, tentatively scheduled for October 10, 2019 with Dr. Fletcher Anon --Leron Croak has been refilled Recommended he has worsening unstable angina no symptoms not relieved with sublingual nitro we would recommend he proceed to [facility] for more urgent intervention.  He is aware of the details above  Hyperlipidemia Long history of medication intolerance Again discussed with him this admission concerning need to try PCSK9 inhibitor -There may be some financial barriers that we will need to work on with him.   Diabetes type 2 Lifestyle modification recommended, Medication compliance, Hemoglobin A1c above goal 8.3 We will need to work aggressively with him  Long discussion concerning recent catheterization films, discharge instructions, plan for staged catheterization November 4 likely needing stent to left main and proximal OM vessel  Total encounter time more than 35 minutes  Greater than 50% was spent in counseling and coordination  of care with the patient   For questions or updates, please contact [place] Please consult [email or web address] for contact info under        Signed, Ida Rogue, MD  09/28/2019, 12:04 PM

## 2019-09-30 ENCOUNTER — Encounter (HOSPITAL_COMMUNITY): Payer: Self-pay | Admitting: Emergency Medicine

## 2019-09-30 ENCOUNTER — Emergency Department (HOSPITAL_COMMUNITY): Payer: BC Managed Care – PPO

## 2019-09-30 ENCOUNTER — Other Ambulatory Visit: Payer: Self-pay

## 2019-09-30 ENCOUNTER — Inpatient Hospital Stay (HOSPITAL_COMMUNITY)
Admission: EM | Admit: 2019-09-30 | Discharge: 2019-10-02 | DRG: 247 | Disposition: A | Discharge status: Home / Self Care | Payer: BC Managed Care – PPO | Attending: Cardiology | Admitting: Cardiology

## 2019-09-30 DIAGNOSIS — Z7982 Long term (current) use of aspirin: Secondary | ICD-10-CM

## 2019-09-30 DIAGNOSIS — I2510 Atherosclerotic heart disease of native coronary artery without angina pectoris: Secondary | ICD-10-CM | POA: Diagnosis not present

## 2019-09-30 DIAGNOSIS — M1990 Unspecified osteoarthritis, unspecified site: Secondary | ICD-10-CM | POA: Diagnosis not present

## 2019-09-30 DIAGNOSIS — Z20828 Contact with and (suspected) exposure to other viral communicable diseases: Secondary | ICD-10-CM | POA: Diagnosis present

## 2019-09-30 DIAGNOSIS — R7989 Other specified abnormal findings of blood chemistry: Secondary | ICD-10-CM | POA: Diagnosis not present

## 2019-09-30 DIAGNOSIS — Z955 Presence of coronary angioplasty implant and graft: Secondary | ICD-10-CM

## 2019-09-30 DIAGNOSIS — Z888 Allergy status to other drugs, medicaments and biological substances status: Secondary | ICD-10-CM | POA: Diagnosis not present

## 2019-09-30 DIAGNOSIS — E785 Hyperlipidemia, unspecified: Secondary | ICD-10-CM | POA: Diagnosis not present

## 2019-09-30 DIAGNOSIS — Z8249 Family history of ischemic heart disease and other diseases of the circulatory system: Secondary | ICD-10-CM

## 2019-09-30 DIAGNOSIS — E118 Type 2 diabetes mellitus with unspecified complications: Secondary | ICD-10-CM | POA: Diagnosis present

## 2019-09-30 DIAGNOSIS — I10 Essential (primary) hypertension: Secondary | ICD-10-CM

## 2019-09-30 DIAGNOSIS — I2583 Coronary atherosclerosis due to lipid rich plaque: Secondary | ICD-10-CM

## 2019-09-30 DIAGNOSIS — Z951 Presence of aortocoronary bypass graft: Secondary | ICD-10-CM

## 2019-09-30 DIAGNOSIS — R079 Chest pain, unspecified: Secondary | ICD-10-CM | POA: Diagnosis not present

## 2019-09-30 DIAGNOSIS — R778 Other specified abnormalities of plasma proteins: Secondary | ICD-10-CM

## 2019-09-30 DIAGNOSIS — E7800 Pure hypercholesterolemia, unspecified: Secondary | ICD-10-CM

## 2019-09-30 DIAGNOSIS — I200 Unstable angina: Secondary | ICD-10-CM | POA: Diagnosis not present

## 2019-09-30 DIAGNOSIS — I25118 Atherosclerotic heart disease of native coronary artery with other forms of angina pectoris: Secondary | ICD-10-CM | POA: Diagnosis present

## 2019-09-30 DIAGNOSIS — Z96651 Presence of right artificial knee joint: Secondary | ICD-10-CM | POA: Diagnosis present

## 2019-09-30 DIAGNOSIS — Z9861 Coronary angioplasty status: Secondary | ICD-10-CM

## 2019-09-30 DIAGNOSIS — Z794 Long term (current) use of insulin: Secondary | ICD-10-CM | POA: Diagnosis not present

## 2019-09-30 DIAGNOSIS — N400 Enlarged prostate without lower urinary tract symptoms: Secondary | ICD-10-CM | POA: Diagnosis present

## 2019-09-30 DIAGNOSIS — E119 Type 2 diabetes mellitus without complications: Secondary | ICD-10-CM | POA: Diagnosis present

## 2019-09-30 DIAGNOSIS — G4733 Obstructive sleep apnea (adult) (pediatric): Secondary | ICD-10-CM | POA: Diagnosis present

## 2019-09-30 DIAGNOSIS — I25110 Atherosclerotic heart disease of native coronary artery with unstable angina pectoris: Principal | ICD-10-CM | POA: Diagnosis present

## 2019-09-30 DIAGNOSIS — K219 Gastro-esophageal reflux disease without esophagitis: Secondary | ICD-10-CM | POA: Diagnosis not present

## 2019-09-30 DIAGNOSIS — Z7984 Long term (current) use of oral hypoglycemic drugs: Secondary | ICD-10-CM

## 2019-09-30 DIAGNOSIS — Z7902 Long term (current) use of antithrombotics/antiplatelets: Secondary | ICD-10-CM

## 2019-09-30 DIAGNOSIS — E1159 Type 2 diabetes mellitus with other circulatory complications: Secondary | ICD-10-CM | POA: Diagnosis present

## 2019-09-30 DIAGNOSIS — Z881 Allergy status to other antibiotic agents status: Secondary | ICD-10-CM

## 2019-09-30 DIAGNOSIS — R0602 Shortness of breath: Secondary | ICD-10-CM

## 2019-09-30 DIAGNOSIS — R0789 Other chest pain: Secondary | ICD-10-CM | POA: Diagnosis present

## 2019-09-30 DIAGNOSIS — Z8042 Family history of malignant neoplasm of prostate: Secondary | ICD-10-CM | POA: Diagnosis not present

## 2019-09-30 DIAGNOSIS — E782 Mixed hyperlipidemia: Secondary | ICD-10-CM | POA: Diagnosis not present

## 2019-09-30 DIAGNOSIS — E1169 Type 2 diabetes mellitus with other specified complication: Secondary | ICD-10-CM | POA: Diagnosis not present

## 2019-09-30 HISTORY — DX: Morbid (severe) obesity due to excess calories: E66.01

## 2019-09-30 HISTORY — DX: Type 2 diabetes mellitus without complications: E11.9

## 2019-09-30 LAB — CBC
HCT: 44.2 % (ref 39.0–52.0)
Hemoglobin: 14.7 g/dL (ref 13.0–17.0)
MCH: 29.8 pg (ref 26.0–34.0)
MCHC: 33.3 g/dL (ref 30.0–36.0)
MCV: 89.7 fL (ref 80.0–100.0)
Platelets: 207 10*3/uL (ref 150–400)
RBC: 4.93 MIL/uL (ref 4.22–5.81)
RDW: 13.5 % (ref 11.5–15.5)
WBC: 7.4 10*3/uL (ref 4.0–10.5)
nRBC: 0 % (ref 0.0–0.2)

## 2019-09-30 LAB — HEPARIN LEVEL (UNFRACTIONATED): Heparin Unfractionated: 0.1 IU/mL — ABNORMAL LOW (ref 0.30–0.70)

## 2019-09-30 LAB — BASIC METABOLIC PANEL
Anion gap: 11 (ref 5–15)
BUN: 13 mg/dL (ref 8–23)
CO2: 22 mmol/L (ref 22–32)
Calcium: 9.2 mg/dL (ref 8.9–10.3)
Chloride: 104 mmol/L (ref 98–111)
Creatinine, Ser: 0.96 mg/dL (ref 0.61–1.24)
GFR calc Af Amer: >60 mL/min (ref >60)
GFR calc non Af Amer: >60 mL/min (ref >60)
Glucose, Bld: 195 mg/dL — ABNORMAL HIGH (ref 70–99)
Potassium: 4 mmol/L (ref 3.5–5.1)
Sodium: 137 mmol/L (ref 135–145)

## 2019-09-30 LAB — MRSA PCR SCREENING: MRSA by PCR: NEGATIVE

## 2019-09-30 LAB — TROPONIN I (HIGH SENSITIVITY)
Troponin I (High Sensitivity): 21 ng/L — ABNORMAL HIGH (ref <18)
Troponin I (High Sensitivity): 22 ng/L — ABNORMAL HIGH (ref <18)

## 2019-09-30 LAB — GLUCOSE, CAPILLARY
Glucose-Capillary: 128 mg/dL — ABNORMAL HIGH (ref 70–99)
Glucose-Capillary: 167 mg/dL — ABNORMAL HIGH (ref 70–99)

## 2019-09-30 MED ORDER — HYDROCODONE-ACETAMINOPHEN 5-325 MG PO TABS
1.0000 | ORAL_TABLET | Freq: Two times a day (BID) | ORAL | Status: DC | PRN
  Administered 2019-09-30 – 2019-10-01 (×3): 1 via ORAL
  Filled 2019-09-30 (×3): qty 1

## 2019-09-30 MED ORDER — CANAGLIFLOZIN 300 MG PO TABS
300.0000 mg | ORAL_TABLET | Freq: Every day | ORAL | Status: DC
  Administered 2019-10-02: 300 mg via ORAL
  Filled 2019-09-30 (×2): qty 1

## 2019-09-30 MED ORDER — SODIUM CHLORIDE 0.9% FLUSH
3.0000 mL | Freq: Two times a day (BID) | INTRAVENOUS | Status: DC
  Administered 2019-10-01 – 2019-10-02 (×3): 3 mL via INTRAVENOUS

## 2019-09-30 MED ORDER — ASPIRIN 81 MG PO CHEW
81.0000 mg | CHEWABLE_TABLET | ORAL | Status: AC
  Administered 2019-10-01: 05:00:00 81 mg via ORAL
  Filled 2019-09-30: qty 1

## 2019-09-30 MED ORDER — HEPARIN BOLUS VIA INFUSION
2000.0000 [IU] | Freq: Once | INTRAVENOUS | Status: AC
  Administered 2019-09-30: 2000 [IU] via INTRAVENOUS
  Filled 2019-09-30: qty 2000

## 2019-09-30 MED ORDER — HEPARIN (PORCINE) 25000 UT/250ML-% IV SOLN
1350.0000 [IU]/h | INTRAVENOUS | Status: DC
  Administered 2019-09-30: 950 [IU]/h via INTRAVENOUS
  Administered 2019-10-01: 1150 [IU]/h via INTRAVENOUS
  Filled 2019-09-30 (×2): qty 250

## 2019-09-30 MED ORDER — NITROGLYCERIN 2 % TD OINT
0.5000 [in_us] | TOPICAL_OINTMENT | Freq: Four times a day (QID) | TRANSDERMAL | Status: DC
  Administered 2019-09-30 – 2019-10-01 (×3): 0.5 [in_us] via TOPICAL
  Filled 2019-09-30: qty 30
  Filled 2019-09-30: qty 1

## 2019-09-30 MED ORDER — ONDANSETRON HCL 4 MG/2ML IJ SOLN: 4.0000 mg | Freq: Four times a day (QID) | INTRAMUSCULAR | Status: DC | PRN

## 2019-09-30 MED ORDER — INSULIN ASPART 100 UNIT/ML ~~LOC~~ SOLN
0.0000 [IU] | Freq: Three times a day (TID) | SUBCUTANEOUS | Status: DC
  Administered 2019-09-30 – 2019-10-01 (×2): 2 [IU] via SUBCUTANEOUS
  Administered 2019-10-01 – 2019-10-02 (×3): 3 [IU] via SUBCUTANEOUS

## 2019-09-30 MED ORDER — HEPARIN BOLUS VIA INFUSION
4000.0000 [IU] | Freq: Once | INTRAVENOUS | Status: AC
  Administered 2019-09-30: 4000 [IU] via INTRAVENOUS
  Filled 2019-09-30: qty 4000

## 2019-09-30 MED ORDER — ASPIRIN EC 81 MG PO TBEC
81.0000 mg | DELAYED_RELEASE_TABLET | Freq: Every day | ORAL | Status: DC
  Administered 2019-09-30 – 2019-10-02 (×2): 81 mg via ORAL
  Filled 2019-09-30 (×2): qty 1

## 2019-09-30 MED ORDER — SODIUM CHLORIDE 0.9% FLUSH
3.0000 mL | Freq: Two times a day (BID) | INTRAVENOUS | Status: DC
  Administered 2019-10-01 – 2019-10-02 (×2): 3 mL via INTRAVENOUS

## 2019-09-30 MED ORDER — PIOGLITAZONE HCL 30 MG PO TABS
30.0000 mg | ORAL_TABLET | Freq: Every day | ORAL | Status: DC
  Administered 2019-09-30: 30 mg via ORAL
  Filled 2019-09-30 (×3): qty 1

## 2019-09-30 MED ORDER — ACETAMINOPHEN 325 MG PO TABS: 650.0000 mg | ORAL_TABLET | ORAL | Status: DC | PRN

## 2019-09-30 MED ORDER — SODIUM CHLORIDE 0.9% FLUSH: 3.0000 mL | INTRAVENOUS | Status: DC | PRN

## 2019-09-30 MED ORDER — NITROGLYCERIN 0.4 MG SL SUBL: 0.4000 mg | SUBLINGUAL_TABLET | SUBLINGUAL | Status: DC | PRN

## 2019-09-30 MED ORDER — LISINOPRIL 20 MG PO TABS
20.0000 mg | ORAL_TABLET | Freq: Every day | ORAL | Status: DC
  Administered 2019-09-30 – 2019-10-02 (×3): 20 mg via ORAL
  Filled 2019-09-30 (×4): qty 1

## 2019-09-30 MED ORDER — SODIUM CHLORIDE 0.9% FLUSH: 3.0000 mL | Freq: Once | INTRAVENOUS | Status: DC

## 2019-09-30 MED ORDER — ASPIRIN 81 MG PO CHEW
324.0000 mg | CHEWABLE_TABLET | Freq: Once | ORAL | Status: AC
  Administered 2019-09-30: 324 mg via ORAL
  Filled 2019-09-30: qty 4

## 2019-09-30 MED ORDER — SODIUM CHLORIDE 0.9 % IV SOLN: 250.0000 mL | INTRAVENOUS | Status: DC | PRN

## 2019-09-30 MED ORDER — TAMSULOSIN HCL 0.4 MG PO CAPS
0.4000 mg | ORAL_CAPSULE | Freq: Every day | ORAL | Status: DC
  Administered 2019-09-30 – 2019-10-02 (×3): 0.4 mg via ORAL
  Filled 2019-09-30 (×3): qty 1

## 2019-09-30 MED ORDER — CHLORHEXIDINE GLUCONATE CLOTH 2 % EX PADS
6.0000 | MEDICATED_PAD | Freq: Every day | CUTANEOUS | Status: DC
  Administered 2019-09-30 – 2019-10-02 (×3): 6 via TOPICAL

## 2019-09-30 MED ORDER — CLOPIDOGREL BISULFATE 75 MG PO TABS
75.0000 mg | ORAL_TABLET | Freq: Every day | ORAL | Status: DC
  Administered 2019-10-01 – 2019-10-02 (×2): 75 mg via ORAL
  Filled 2019-09-30 (×2): qty 1

## 2019-09-30 MED ORDER — SODIUM CHLORIDE 0.9 % IV SOLN
INTRAVENOUS | Status: DC
  Administered 2019-10-01 (×2): via INTRAVENOUS

## 2019-09-30 NOTE — H&P (Addendum)
History & Physical    Patient ID: [name] [name] MRN: [id number], DOB/AGE: 69/23/1952   Admit date: 09/30/2019   Primary Physician: Venia Carbon, MD Primary Cardiologist: Ida Rogue, MD  Patient Profile    68 y/o ? with a h/o CAD status post three-vessel bypass in December 2012, hypertension, hyperlipidemia, statin intolerance, type 2 diabetes mellitus, BPH, chronic pain, obesity, and sleep apnea on CPAP, who presented to the emergency department today with unstable angina after recent percutaneous coronary intervention of the right coronary artery with known residual disease.  Past Medical History    Past Medical History:  Diagnosis Date  . Angina   . Arthritis   . BPH (benign prostatic hypertrophy)   . Carpal tunnel syndrome   . Coronary artery disease    a. 11/2011 s/p CABG x 3; b. 09/2019 PCI: LM 50ost, LAD 100ost/p, Sev diff diag dzs, LCX nl, OM1 46m, OM3 40, RCA 70ost (iFR 0.88--> 3.0x26 Resolute Onyx DES), 45m, LIMA->LAD ok. VG->D1->D3 ok. EF 50-55%.  Marland Kitchen GERD (gastroesophageal reflux disease)   . Hx of colonic polyps   . Hyperlipidemia   . Hypertension   . Kidney stones   . Morbid obesity ([place])   . OSA (obstructive sleep apnea)    uses cpap  . Type II diabetes mellitus ([place])     Past Surgical History:  Procedure Laterality Date  . CORONARY ARTERY BYPASS GRAFT  11/12/2011   Procedure: CORONARY ARTERY BYPASS GRAFTING (CABG);  Surgeon: Grace Isaac, MD;  Location: [facility];  Service: Open Heart Surgery;  Laterality: N/A;  Coronary Artery Bypass Graft times three on pump utilizing left internal mammary artery and right saphenous vein harvested endoscopically   . CORONARY STENT INTERVENTION N/A 09/27/2019   Procedure: CORONARY STENT INTERVENTION;  Surgeon: Wellington Hampshire, MD;  Location: [facility];  Service: Cardiovascular;  Laterality: N/A;  . HERNIA REPAIR    . INTRAVASCULAR PRESSURE WIRE/FFR STUDY N/A 09/27/2019   Procedure: INTRAVASCULAR  PRESSURE WIRE/FFR STUDY;  Surgeon: Wellington Hampshire, MD;  Location: [facility];  Service: Cardiovascular;  Laterality: N/A;  . KNEE ARTHROPLASTY Right 11/23/2017   Procedure: COMPUTER ASSISTED TOTAL KNEE ARTHROPLASTY;  Surgeon: Dereck Leep, MD;  Location: [facility];  Service: Orthopedics;  Laterality: Right;  . KNEE SURGERY     left  . LEFT HEART CATH AND CORONARY ANGIOGRAPHY Left 09/27/2019   Procedure: LEFT HEART CATH AND CORONARY ANGIOGRAPHY;  Surgeon: Minna Merritts, MD;  Location: [facility];  Service: Cardiovascular;  Laterality: Left;  . MENISCUS REPAIR  03/08   right knee     Allergies  Allergies  Allergen Reactions  . Crestor [Rosuvastatin] Other (See Comments)    Muscle aches.  . Doxazosin Mesylate Other (See Comments)    REACTION: didn't work and may have caused SOB  . Doxycycline Other (See Comments)    REACTION: unspecified  . Glimepiride Other (See Comments)    REACTION: red eyes  . Glipizide Other (See Comments)    REACTION: myalgia??  . Losartan Potassium-Hctz Other (See Comments)    REACTION: chest \\T \ leg pain  . Metoprolol Tartrate Other (See Comments)    Mood swings, memory changes  . Pravastatin Other (See Comments)    Muscle aches   . Rosiglitazone Maleate Other (See Comments)    REACTION: myalgia  . Simvastatin Other (See Comments)    REACTION: myalgias  . Tetracyclines & Related   . Carvedilol Other (See Comments)  Felt bad    History of Present Illness    68 year old male with the above complex past medical history including coronary artery disease status post three-vessel bypass in December 2012, hypertension, hyperlipidemia, statin intolerance, type 2 diabetes mellitus, BPH, chronic pain, morbid obesity, and sleep apnea on CPAP.  He was recently evaluated in cardiology clinic with a report of 6-week history of intermittent nocturnal chest pressure with, which he identifies as being similar to what he experienced prior  to his bypass.  He also noted some decrease in activity tolerance.  Given symptoms concerning for unstable angina, he was placed on long-acting nitrate therapy and decision was made to pursue diagnostic catheterization.  Unfortunately, he did not tolerate nitrates secondary to headache.  He underwent diagnostic catheterization on October 22 which revealed moderate ostial left main disease with a known occlusion of the LAD, patent LIMA to the LAD, patent sequential vein graft to the D1 and D3, 90% stenosis in the OM1, and a 70% stenosis in the ostial/proximal RCA.  Films were reviewed by interventional cardiology and iFR was performed in the RCA and was abnormal at 0.88.  Therefore, the ostial/proximal RCA was successfully treated with a 3.0 x 26 mm resolute Onyx drug-eluting stent.  It was felt that the patient would benefit from left main and OM1 intervention however, given contrast load and fluoroscopy time up to that point, it was felt that this would be better performed in a staged fashion.  He was discharged home October 23 with plan for staged PCI of the left main and OM 1 to be performed at [facility] by Dr. Fletcher Anon on November 4.  Patient felt well following discharge however, he awoke this morning at 4:30 AM with recurrent dyspnea and chest pressure.  He took nitroglycerin with some but not complete relief.  As dyspnea and mild chest pressure persisted, he presented to the [facility] ED.  Here, ECG shows inferolateral ST depression which is similar to his October 14 ECG.  Initial high-sensitivity troponin is mildly elevated at 21.  Lab work otherwise unremarkable.  He says his symptoms improved but then he was given an aspirin and he has had return of mild chest pressure and dyspnea.  Home Medications    Prior to Admission medications   Medication Sig Start Date End Date Taking? Authorizing Provider  aspirin EC 81 MG tablet Take 1 tablet (81 mg total) by mouth daily. 08/20/19  Yes Dunn, Areta Haber, PA-C   canagliflozin (INVOKANA) 300 MG TABS tablet Take 1 tablet (300 mg total) by mouth daily before breakfast. 12/27/18  Yes Venia Carbon, MD  clopidogrel (PLAVIX) 75 MG tablet Take 1 tablet (75 mg total) by mouth daily with breakfast. 09/28/19  Yes Theora Gianotti, NP  furosemide (LASIX) 40 MG tablet Take 1 tablet (40 mg total) by mouth daily as needed. Patient taking differently: Take 40 mg by mouth daily as needed for fluid.  07/04/18  Yes Gollan, Kathlene November, MD  HYDROcodone-acetaminophen (NORCO/VICODIN) 5-325 MG tablet TAKE 1 TABLET BY MOUTH 2 TIMES DAILY Patient taking differently: Take 1 tablet by mouth 2 (two) times daily as needed (pain).  09/03/19  Yes Viviana Simpler I, MD  lisinopril (ZESTRIL) 20 MG tablet Take 1 tablet (20 mg total) by mouth daily. 08/20/19  Yes Dunn, Areta Haber, PA-C  metFORMIN (GLUCOPHAGE) 1000 MG tablet Take 1 tablet (1,000 mg total) by mouth 2 (two) times daily with a meal. 12/05/18  Yes Venia Carbon, MD  nitroGLYCERIN (NITROSTAT) 0.4  MG SL tablet Place 1 tablet (0.4 mg total) under the tongue every 5 (five) minutes as needed for chest pain. 09/14/17  Yes Viviana Simpler I, MD  pioglitazone (ACTOS) 30 MG tablet TAKE 1 TABLET DAILY Patient taking differently: Take 30 mg by mouth daily.  09/21/19  Yes Venia Carbon, MD  potassium chloride SA (K-DUR,KLOR-CON) 20 MEQ tablet Take 1 tablet (20 mEq total) by mouth 2 (two) times daily as needed (Take with lasix). 03/20/18  Yes Venia Carbon, MD  tamsulosin (FLOMAX) 0.4 MG CAPS capsule Take 1 capsule (0.4 mg total) by mouth daily. 09/05/19  Yes Viviana Simpler I, MD  triamcinolone cream (KENALOG) 0.1 % Apply 1 application topically 2 (two) times daily as needed. Patient taking differently: Apply 1 application topically 2 (two) times daily as needed (irritation).  02/26/14  Yes Viviana Simpler I, MD  glucose blood (FREESTYLE LITE) test strip Use as instructed, patient tests once daily. Dx: 250.00 01/14/12   Venia Carbon, MD    Family History    Family History  Problem Relation Age of Onset  . Coronary artery disease Father   . Prostate cancer Father   . Diabetes Neg Hx   . Hypertension Neg Hx    He reported the following about his mother: anxiety. He indicated that his father is alive. He indicated that the status of his sister is unknown. He indicated that the status of his brother is unknown. He indicated that the status of his neg hx is unknown.   Social History    Social History   Socioeconomic History  . Marital status: Married    Spouse name: Not on file  . Number of children: 3  . Years of education: Not on file  . Highest education level: Not on file  Occupational History  . Occupation: Surveyor, quantity  Social Needs  . Financial resource strain: Not on file  . Food insecurity    Worry: Not on file    Inability: Not on file  . Transportation needs    Medical: Not on file    Non-medical: Not on file  Tobacco Use  . Smoking status: Never Smoker  . Smokeless tobacco: Never Used  Substance and Sexual Activity  . Alcohol use: No  . Drug use: No  . Sexual activity: Yes  Lifestyle  . Physical activity    Days per week: Not on file    Minutes per session: Not on file  . Stress: Not on file  Relationships  . Social Herbalist on phone: Not on file    Gets together: Not on file    Attends religious service: Not on file    Active member of club or organization: Not on file    Attends meetings of clubs or organizations: Not on file    Relationship status: Not on file  . Intimate partner violence    Fear of current or ex partner: Not on file    Emotionally abused: Not on file    Physically abused: Not on file    Forced sexual activity: Not on file  Other Topics Concern  . Not on file  Social History Narrative   No living will   Requests wife as health care POA   Would accept resuscitation   Would accept tube feedings     Review of Systems    General:   No chills, fever, night sweats or weight changes.  Cardiovascular:  +++ chest pain, +++  dyspnea on exertion, no edema, orthopnea, palpitations, paroxysmal nocturnal dyspnea. Dermatological: No rash, lesions/masses Respiratory: No cough, +++ dyspnea Urologic: No hematuria, dysuria Abdominal:   No nausea, vomiting, diarrhea, bright red blood per rectum, melena, or hematemesis Neurologic:  No visual changes, wkns, changes in mental status. All other systems reviewed and are otherwise negative except as noted above.  Physical Exam    Blood pressure 131/75, pulse 64, temperature 98.3 F (36.8 C), resp. rate 16, height 5\' 4"  (1.626 m), weight 95.3 kg, SpO2 96 %.  General: Pleasant, NAD Psych: Normal affect. Neuro: Alert and oriented X 3. Moves all extremities spontaneously. HEENT: Normal  Neck: Supple without bruits or JVD. Lungs:  Resp regular and unlabored, CTA. Heart: RRR no s3, s4, or murmurs. Abdomen: Soft, non-tender, non-distended, BS + x 4.  Extremities: No clubbing, cyanosis or edema. DP/PT/Radials 2+ and equal bilaterally.  Labs    Cardiac Enzymes Recent Labs  Lab 09/30/19 0921  TROPONINIHS 21*      Lab Results  Component Value Date   WBC 7.4 09/30/2019   HGB 14.7 09/30/2019   HCT 44.2 09/30/2019   MCV 89.7 09/30/2019   PLT 207 09/30/2019    Recent Labs  Lab 09/30/19 0921  NA 137  K 4.0  CL 104  CO2 22  BUN 13  CREATININE 0.96  CALCIUM 9.2  GLUCOSE 195*   Lab Results  Component Value Date   CHOL 217 (H) 09/05/2019   HDL 45.30 09/05/2019   LDLCALC 130 (H) 03/20/2018   TRIG 223.0 (H) 09/05/2019     Radiology Studies    Dg Chest 2 View  Result Date: 09/30/2019 CLINICAL DATA:  Chest pain and shortness of breath EXAM: CHEST - 2 VIEW COMPARISON:  Chest radiograph dated 09/05/2019 FINDINGS: The heart remains enlarged. Median sternotomy wires are redemonstrated. The lungs are clear. There is no pleural effusion or pneumothorax. Degenerative changes are  seen in the spine. IMPRESSION: No active cardiopulmonary disease. Electronically Signed   By: Zerita Boers M.D.   On: 09/30/2019 10:13    ECG & Cardiac Imaging    RSR, 76, 58mm inflat ST depression - more pronounced than on prior ECG, but similar to 09/19/2019 ECG - personally reviewed.  Assessment & Plan    1.  Unstable Angina/CAD:  Pt w/h/o CAD s/p 3 vessel CABG in 2012, who was recently seen in clinic w/ recurrent nocturnal dyspnea and c/p - similar to prior anginal equivalent.  He underwent cath on 10/22, revealing moderate LM, severe OM1, and moderate RCA dzs (w/ abnl iFR).  3/3 patent grafts.  The RCA was intervened upon w/ a plan for staged PCI of the LM and OM1 on 11/4 w/ Dr. Fletcher Anon.  Unfortunately however, Mr. Skarda awoke this AM w/ recurrent dyspnea and c/p that was only partially nitrate responsive, thus prompting him to present to the ED.  Here, HsTrop is minimally elevated @ 21.  ECG w/ inferolateral ST depression, similar to 10/14 ECG.  He currently reports mild dyspnea and chest pressure.  Plan to admit to progressive care, heparinize, cont to cycle trop.  He has a h/o headaches w/ isosorbide, so I will hold off on IV ntg, but will add ntp 0.5 inch - hopefully he will tolerate but may need to d/c if not.  Cont asa, plavix.  Prev not on  blocker in the setting of relative bradycardia.  Will plan to have interventional team review in AM.  The patient understands that risks include but are  not limited to stroke (1 in 1000), death (1 in 29), kidney failure [usually temporary] (1 in 500), bleeding (1 in 200), allergic reaction [possibly serious] (1 in 200), and agrees to proceed.    2.  Essential HTN:  Cont home meds and titrate as necessary.  3.  HL:  Statin intolerant.  Has previously wished to avoid zetia and/or pcsk9i.  Will need to readdress as outpt.  Bempedoic acid also an option now.  4.  DMII:  Will hold metformin.  Add SSI.  5.  OSA:  CPAP.  6.  BPH:  Cont flomax.   Signed, Murray Hodgkins, NP 09/30/2019, 12:14 PM

## 2019-09-30 NOTE — ED Provider Notes (Signed)
[facility] EMERGENCY DEPARTMENT Provider Note   CSN: [id number] Arrival date & time: 09/30/19  [id number]     History   Chief Complaint Chief Complaint  Patient presents with  . Chest Pain  . Numbness  . Shortness of Breath    HPI [name] [name] is a 68 y.o. male presenting for evaluation of chest pain and shortness of breath.  Patient states he started develop shortness of breath last night at 2 AM.  He was awakened from sleep at 4:30 AM with chest pain which he describes as a heaviness and pressure in the center of his chest.  Symptoms improved but did not completely resolve with nitro, however he was again woken up at 530 with repeat chest pain/pressure and tingling of his right foot.  Patient states he had a heart cath 10/22 (3 days ago), with multivessel disease.  They were unable to get all the stents placed that they wanted, as such is scheduled for repeat cath next week. He was started on plavix and asa at discharge, but has not taken his medications today. He denies fevers, chills, cough, n/v, abd pain, urinary sxs. Pt states he is currently sob, cue denies CP/pressure.      HPI  Past Medical History:  Diagnosis Date  . Angina   . Arthritis   . BPH (benign prostatic hypertrophy)   . Carpal tunnel syndrome   . Coronary artery disease    a. 11/2011 s/p CABG x 3; b. 09/2019 PCI: LM 50ost, LAD 100ost/p, Sev diff diag dzs, LCX nl, OM1 41m, OM3 40, RCA 70ost (iFR 0.88--> 3.0x26 Resolute Onyx DES), 54m, LIMA->LAD ok. VG->D1->D3 ok. EF 50-55%.  Marland Kitchen GERD (gastroesophageal reflux disease)   . Hx of colonic polyps   . Hyperlipidemia   . Hypertension   . Kidney stones   . Morbid obesity ([place])   . OSA (obstructive sleep apnea)    uses cpap  . Type II diabetes mellitus [facility])     Patient Active Problem List   Diagnosis Date Noted  . Unstable angina ([employer]) 09/27/2019  . Effort angina ([place]) 09/27/2019  . Type II diabetes mellitus ([place]) 07/04/2018  . Statin  intolerance 03/20/2018  . Seasonal and perennial allergic rhinitis 03/01/2018  . Chronic pain syndrome 12/16/2017  . S/P total knee arthroplasty 11/23/2017  . Narcotic dependence ([place]) 09/14/2017  . Gastroenteritis 09/14/2017  . Encounter for chronic pain management 04/26/2017  . Advance directive discussed with patient 03/16/2017  . Hx of CABG 12/17/2014  . Controlled diabetes mellitus type 2 with complications ([place]) [id number]  . SOB (shortness of breath) 12/13/2011  . Coronary artery disease of native artery of native heart with stable angina pectoris ([place]) 11/11/2011  . Routine general medical examination at a health care facility 05/05/2011  . ACTINIC KERATOSIS 04/28/2010  . COLONIC POLYPS, HX OF 04/28/2010  . BPH with obstruction/lower urinary tract symptoms 11/28/2007  . Osteoarthritis of knee 10/26/2007  . Hyperlipidemia 04/17/2007  . CARPAL TUNNEL SYNDROME 04/17/2007  . Essential hypertension 04/17/2007  . OSA (obstructive sleep apnea) 04/17/2007    Past Surgical History:  Procedure Laterality Date  . CORONARY ARTERY BYPASS GRAFT  11/12/2011   Procedure: CORONARY ARTERY BYPASS GRAFTING (CABG);  Surgeon: Grace Isaac, MD;  Location: [facility];  Service: Open Heart Surgery;  Laterality: N/A;  Coronary Artery Bypass Graft times three on pump utilizing left internal mammary artery and right saphenous vein harvested endoscopically   . CORONARY STENT INTERVENTION N/A 09/27/2019  Procedure: CORONARY STENT INTERVENTION;  Surgeon: Wellington Hampshire, MD;  Location: [facility] CV LAB;  Service: Cardiovascular;  Laterality: N/A;  . HERNIA REPAIR    . INTRAVASCULAR PRESSURE WIRE/FFR STUDY N/A 09/27/2019   Procedure: INTRAVASCULAR PRESSURE WIRE/FFR STUDY;  Surgeon: Wellington Hampshire, MD;  Location: [facility] CV LAB;  Service: Cardiovascular;  Laterality: N/A;  . KNEE ARTHROPLASTY Right 11/23/2017   Procedure: COMPUTER ASSISTED TOTAL KNEE ARTHROPLASTY;  Surgeon: Dereck Leep,  MD;  Location: [facility];  Service: Orthopedics;  Laterality: Right;  . KNEE SURGERY     left  . LEFT HEART CATH AND CORONARY ANGIOGRAPHY Left 09/27/2019   Procedure: LEFT HEART CATH AND CORONARY ANGIOGRAPHY;  Surgeon: Minna Merritts, MD;  Location: [place] [facility] CV LAB;  Service: Cardiovascular;  Laterality: Left;  . MENISCUS REPAIR  03/08   right knee        Home Medications    Prior to Admission medications   Medication Sig Start Date End Date Taking? Authorizing Provider  aspirin EC 81 MG tablet Take 1 tablet (81 mg total) by mouth daily. 08/20/19  Yes Dunn, Areta Haber, PA-C  canagliflozin (INVOKANA) 300 MG TABS tablet Take 1 tablet (300 mg total) by mouth daily before breakfast. 12/27/18  Yes Venia Carbon, MD  clopidogrel (PLAVIX) 75 MG tablet Take 1 tablet (75 mg total) by mouth daily with breakfast. 09/28/19  Yes Theora Gianotti, NP  furosemide (LASIX) 40 MG tablet Take 1 tablet (40 mg total) by mouth daily as needed. Patient taking differently: Take 40 mg by mouth daily as needed for fluid.  07/04/18  Yes Gollan, Kathlene November, MD  HYDROcodone-acetaminophen (NORCO/VICODIN) 5-325 MG tablet TAKE 1 TABLET BY MOUTH 2 TIMES DAILY Patient taking differently: Take 1 tablet by mouth 2 (two) times daily as needed (pain).  09/03/19  Yes Viviana Simpler I, MD  lisinopril (ZESTRIL) 20 MG tablet Take 1 tablet (20 mg total) by mouth daily. 08/20/19  Yes Dunn, Areta Haber, PA-C  metFORMIN (GLUCOPHAGE) 1000 MG tablet Take 1 tablet (1,000 mg total) by mouth 2 (two) times daily with a meal. 12/05/18  Yes Venia Carbon, MD  nitroGLYCERIN (NITROSTAT) 0.4 MG SL tablet Place 1 tablet (0.4 mg total) under the tongue every 5 (five) minutes as needed for chest pain. 09/14/17  Yes Viviana Simpler I, MD  pioglitazone (ACTOS) 30 MG tablet TAKE 1 TABLET DAILY Patient taking differently: Take 30 mg by mouth daily.  09/21/19  Yes Venia Carbon, MD  potassium chloride SA (K-DUR,KLOR-CON) 20 MEQ tablet  Take 1 tablet (20 mEq total) by mouth 2 (two) times daily as needed (Take with lasix). 03/20/18  Yes Venia Carbon, MD  tamsulosin (FLOMAX) 0.4 MG CAPS capsule Take 1 capsule (0.4 mg total) by mouth daily. 09/05/19  Yes Viviana Simpler I, MD  triamcinolone cream (KENALOG) 0.1 % Apply 1 application topically 2 (two) times daily as needed. Patient taking differently: Apply 1 application topically 2 (two) times daily as needed (irritation).  02/26/14  Yes Viviana Simpler I, MD  glucose blood (FREESTYLE LITE) test strip Use as instructed, patient tests once daily. Dx: 250.00 01/14/12   Venia Carbon, MD    Family History Family History  Problem Relation Age of Onset  . Coronary artery disease Father   . Prostate cancer Father   . Diabetes Neg Hx   . Hypertension Neg Hx     Social History Social History   Tobacco Use  . Smoking status:  Never Smoker  . Smokeless tobacco: Never Used  Substance Use Topics  . Alcohol use: No  . Drug use: No     Allergies   Crestor [rosuvastatin], Doxazosin mesylate, Doxycycline, Glimepiride, Glipizide, Losartan potassium-hctz, Metoprolol tartrate, Pravastatin, Rosiglitazone maleate, Simvastatin, Tetracyclines & related, and Carvedilol   Review of Systems Review of Systems  Respiratory: Positive for shortness of breath.   Cardiovascular: Positive for chest pain.  Neurological:       R leg tingling, resolved  All other systems reviewed and are negative.    Physical Exam Updated Vital Signs BP 138/68   Pulse 66   Temp 98.3 F (36.8 C)   Resp (!) 22   Ht 5\' 4"  (1.626 m)   Wt 95.3 kg   SpO2 97%   BMI 36.05 kg/m   Physical Exam Vitals signs and nursing note reviewed.  Constitutional:      General: He is not in acute distress.    Appearance: He is well-developed.     Comments: Appears nontoxic  HENT:     Head: Normocephalic and atraumatic.  Eyes:     Conjunctiva/sclera: Conjunctivae normal.     Pupils: Pupils are equal, round, and  reactive to light.  Neck:     Musculoskeletal: Normal range of motion and neck supple.  Cardiovascular:     Rate and Rhythm: Normal rate and regular rhythm.     Pulses: Normal pulses.  Pulmonary:     Effort: Pulmonary effort is normal. No respiratory distress.     Breath sounds: Normal breath sounds. No wheezing.     Comments: Speaking in full sentences. Clear lung sounds in all fields.  Abdominal:     General: Bowel sounds are normal. There is no distension.     Palpations: Abdomen is soft.     Tenderness: There is no abdominal tenderness.  Musculoskeletal: Normal range of motion.     Comments: Slight increased swelling of R leg when compared to L, baseline per pt  Skin:    General: Skin is warm and dry.     Capillary Refill: Capillary refill takes less than 2 seconds.  Neurological:     Mental Status: He is alert and oriented to person, place, and time.      ED Treatments / Results  Labs (all labs ordered are listed, but only abnormal results are displayed) Labs Reviewed  BASIC METABOLIC PANEL - Abnormal; Notable for the following components:      Result Value   Glucose, Bld 195 (*)    All other components within normal limits  TROPONIN I (HIGH SENSITIVITY) - Abnormal; Notable for the following components:   Troponin I (High Sensitivity) 21 (*)    All other components within normal limits  TROPONIN I (HIGH SENSITIVITY) - Abnormal; Notable for the following components:   Troponin I (High Sensitivity) 22 (*)    All other components within normal limits  CBC  HEPARIN LEVEL (UNFRACTIONATED)    EKG None  Radiology Dg Chest 2 View  Result Date: 09/30/2019 CLINICAL DATA:  Chest pain and shortness of breath EXAM: CHEST - 2 VIEW COMPARISON:  Chest radiograph dated 09/05/2019 FINDINGS: The heart remains enlarged. Median sternotomy wires are redemonstrated. The lungs are clear. There is no pleural effusion or pneumothorax. Degenerative changes are seen in the spine.  IMPRESSION: No active cardiopulmonary disease. Electronically Signed   By: Zerita Boers M.D.   On: 09/30/2019 10:13    Procedures Procedures (including critical care time)  Medications Ordered in  ED Medications  aspirin EC tablet 81 mg (has no administration in time range)  HYDROcodone-acetaminophen (NORCO/VICODIN) 5-325 MG per tablet 1 tablet (has no administration in time range)  lisinopril (ZESTRIL) tablet 20 mg (has no administration in time range)  nitroGLYCERIN (NITROSTAT) SL tablet 0.4 mg (has no administration in time range)  canagliflozin (INVOKANA) tablet 300 mg (has no administration in time range)  pioglitazone (ACTOS) tablet 30 mg (has no administration in time range)  tamsulosin (FLOMAX) capsule 0.4 mg (has no administration in time range)  clopidogrel (PLAVIX) tablet 75 mg (has no administration in time range)  acetaminophen (TYLENOL) tablet 650 mg (has no administration in time range)  ondansetron (ZOFRAN) injection 4 mg (has no administration in time range)  sodium chloride flush (NS) 0.9 % injection 3 mL (has no administration in time range)  sodium chloride flush (NS) 0.9 % injection 3 mL (has no administration in time range)  0.9 %  sodium chloride infusion (has no administration in time range)  aspirin chewable tablet 81 mg (has no administration in time range)  sodium chloride flush (NS) 0.9 % injection 3 mL (has no administration in time range)  sodium chloride flush (NS) 0.9 % injection 3 mL (has no administration in time range)  0.9 %  sodium chloride infusion (has no administration in time range)  insulin aspart (novoLOG) injection 0-15 Units (has no administration in time range)  nitroGLYCERIN (NITROGLYN) 2 % ointment 0.5 inch (0.5 inches Topical Given 09/30/19 1412)  heparin ADULT infusion 100 units/mL (25000 units/259mL sodium chloride 0.45%) (950 Units/hr Intravenous New Bag/Given 09/30/19 1407)  0.9 %  sodium chloride infusion (has no administration in time  range)  aspirin chewable tablet 324 mg (324 mg Oral Given 09/30/19 1107)  heparin bolus via infusion 4,000 Units (4,000 Units Intravenous Bolus from Bag 09/30/19 1407)     Initial Impression / Assessment and Plan / ED Course  I have reviewed the triage vital signs and the nursing notes.  Pertinent labs & imaging results that were available during my care of the patient were reviewed by me and considered in my medical decision making (see chart for details).        Patient presenting for evaluation of chest pain and shortness of breath.  Physical exam shows patient appears nontoxic.  However, history is concerning due to his recent heart cath and recurrence of symptoms.  Will obtain cardiac labs, EKG, chest x-ray.  EKG shows T wave inversions.  Troponin mildly elevated at 21, no recent previous to compare. Pt had negative COVID 6 days ago. Will call cardiology, as pt will likely need to be admitted. ASA given. Case duscussed with attending, Dr. Rex Kras agrees to plan.   Discussed with Dr. Debara Pickett from cardiology, cardiology will see the pt.   Pt admitted by cardiology  Final Clinical Impressions(s) / ED Diagnoses   Final diagnoses:  Chest pain, unspecified type  Shortness of breath  Elevated troponin    ED Discharge Orders    None       Franchot Heidelberg, PA-C 09/30/19 1432    Little, Wenda Overland, MD 10/02/19 1356

## 2019-09-30 NOTE — Progress Notes (Signed)
[facility] for heparin Indication: chest pain/ACS  Heparin Dosing Weight: 80.4 kg  Labs: Recent Labs    09/28/19 0618 09/30/19 0921 09/30/19 2027  HGB 14.1 14.7  --   HCT 43.2 44.2  --   PLT 201 207  --   HEPARINUNFRC  --   --  0.10*  CREATININE 0.84 0.96  --     Assessment: 23 yom presenting with unstable again after recent PCI of R coronary artery with known residual disease. Pharmacy consulted to dose heparin for ACS. Not on anticoagulation PTA. CBC wnl. -initial heparin level= 0.1  Goal of Therapy:  Heparin level 0.3-0.7 units/ml Monitor platelets by anticoagulation protocol: Yes   Plan: Heparin bolus 2000 unit x1  Increase heparin to 1150 units/hr Daily heparin level/CBC  Hildred Laser, PharmD Clinical Pharmacist **Pharmacist phone directory can now be found on [email or web address] ([email or web address]).  Listed under [place].

## 2019-09-30 NOTE — Progress Notes (Signed)
[facility] for heparin Indication: chest pain/ACS  Heparin Dosing Weight: 80.4 kg  Labs: Recent Labs    09/28/19 0618 09/30/19 0921  HGB 14.1 14.7  HCT 43.2 44.2  PLT 201 207  CREATININE 0.84 0.96    Assessment: 38 yom presenting with unstable again after recent PCI of R coronary artery with known residual disease. Pharmacy consulted to dose heparin for ACS. Not on anticoagulation PTA. CBC wnl. No active bleed issues documented.  Goal of Therapy:  Heparin level 0.3-0.7 units/ml Monitor platelets by anticoagulation protocol: Yes   Plan:  Heparin 4000 unit bolus Start heparin at 950 units/h 6h heparin level Daily heparin level/CBC Monitor s/sx bleeding  Elicia Lamp, PharmD, BCPS Clinical Pharmacist 09/30/2019 12:49 PM

## 2019-09-30 NOTE — ED Triage Notes (Signed)
Pt. Stated, I had a stent put in on Thursday and this morning I started having some chest pressure and some numbness.

## 2019-09-30 NOTE — Progress Notes (Signed)
Patient refused CPAP tonight 

## 2019-09-30 NOTE — Progress Notes (Deleted)
Cardiology Office Note    Date:  09/30/2019   ID:  [name], DOB 1951-03-11, MRN [id number]  PCP:  Venia Carbon, MD  Cardiologist:  Ida Rogue, MD  Electrophysiologist:  None   Chief Complaint: ***  History of Present Illness:   [name] [name] is a 68 y.o. male with history of ***  Past Medical History:  Diagnosis Date  . Angina   . Arthritis   . BPH (benign prostatic hypertrophy)   . Carpal tunnel syndrome   . Coronary artery disease    a. 11/2011 s/p CABG x 3; b. 09/2019 PCI: LM 50ost, LAD 100ost/p, Sev diff diag dzs, LCX nl, OM1 49m, OM3 40, RCA 70ost (iFR 0.88--> 3.0x26 Resolute Onyx DES), 37m, LIMA->LAD ok. VG->D1->D3 ok. EF 50-55%.  Marland Kitchen GERD (gastroesophageal reflux disease)   . Hx of colonic polyps   . Hyperlipidemia   . Hypertension   . Kidney stones   . Morbid obesity ([place])   . OSA (obstructive sleep apnea)    uses cpap  . Type II diabetes mellitus ([place])     Past Surgical History:  Procedure Laterality Date  . CORONARY ARTERY BYPASS GRAFT  11/12/2011   Procedure: CORONARY ARTERY BYPASS GRAFTING (CABG);  Surgeon: Grace Isaac, MD;  Location: [facility];  Service: Open Heart Surgery;  Laterality: N/A;  Coronary Artery Bypass Graft times three on pump utilizing left internal mammary artery and right saphenous vein harvested endoscopically   . CORONARY STENT INTERVENTION N/A 09/27/2019   Procedure: CORONARY STENT INTERVENTION;  Surgeon: Wellington Hampshire, MD;  Location: [facility] CV LAB;  Service: Cardiovascular;  Laterality: N/A;  . HERNIA REPAIR    . INTRAVASCULAR PRESSURE WIRE/FFR STUDY N/A 09/27/2019   Procedure: INTRAVASCULAR PRESSURE WIRE/FFR STUDY;  Surgeon: Wellington Hampshire, MD;  Location: [facility] CV LAB;  Service: Cardiovascular;  Laterality: N/A;  . KNEE ARTHROPLASTY Right 11/23/2017   Procedure: COMPUTER ASSISTED TOTAL KNEE ARTHROPLASTY;  Surgeon: Dereck Leep, MD;  Location: [facility];  Service: Orthopedics;   Laterality: Right;  . KNEE SURGERY     left  . LEFT HEART CATH AND CORONARY ANGIOGRAPHY Left 09/27/2019   Procedure: LEFT HEART CATH AND CORONARY ANGIOGRAPHY;  Surgeon: Minna Merritts, MD;  Location: [facility] CV LAB;  Service: Cardiovascular;  Laterality: Left;  . MENISCUS REPAIR  03/08   right knee    Current Medications: No outpatient medications have been marked as taking for the 10/02/19 encounter (Appointment) with Rise Mu, PA-C.    Allergies:   Crestor [rosuvastatin], Doxazosin mesylate, Doxycycline, Glimepiride, Glipizide, Losartan potassium-hctz, Metoprolol tartrate, Pravastatin, Rosiglitazone maleate, Simvastatin, Tetracyclines & related, and Carvedilol   Social History   Socioeconomic History  . Marital status: Married    Spouse name: Not on file  . Number of children: 3  . Years of education: Not on file  . Highest education level: Not on file  Occupational History  . Occupation: Surveyor, quantity  Social Needs  . Financial resource strain: Not on file  . Food insecurity    Worry: Not on file    Inability: Not on file  . Transportation needs    Medical: Not on file    Non-medical: Not on file  Tobacco Use  . Smoking status: Never Smoker  . Smokeless tobacco: Never Used  Substance and Sexual Activity  . Alcohol use: No  . Drug use: No  . Sexual activity: Yes  Lifestyle  . Physical activity  Days per week: Not on file    Minutes per session: Not on file  . Stress: Not on file  Relationships  . Social Herbalist on phone: Not on file    Gets together: Not on file    Attends religious service: Not on file    Active member of club or organization: Not on file    Attends meetings of clubs or organizations: Not on file    Relationship status: Not on file  Other Topics Concern  . Not on file  Social History Narrative   No living will   Requests wife as health care POA   Would accept resuscitation   Would accept tube feedings      Family History:  The patient's family history includes Coronary artery disease in his father; Prostate cancer in his father. There is no history of Diabetes or Hypertension.  ROS:   ROS   EKGs/Labs/Other Studies Reviewed:    Studies reviewed were summarized above. The additional studies were reviewed today: ***  EKG:  EKG is ordered today.  The EKG ordered today demonstrates ***  Recent Labs: 09/05/2019: ALT 15 09/30/2019: BUN 13; Creatinine, Ser 0.96; Hemoglobin 14.7; Platelets 207; Potassium 4.0; Sodium 137  Recent Lipid Panel    Component Value Date/Time   CHOL 217 (H) 09/05/2019 1138   TRIG 223.0 (H) 09/05/2019 1138   HDL 45.30 09/05/2019 1138   CHOLHDL 5 09/05/2019 1138   VLDL 44.6 (H) 09/05/2019 1138   LDLCALC 130 (H) 03/20/2018 1146   LDLDIRECT 132.0 09/05/2019 1138    PHYSICAL EXAM:    VS:  There were no vitals taken for this visit.  BMI: There is no height or weight on file to calculate BMI.  Physical Exam  Wt Readings from Last 3 Encounters:  09/30/19 210 lb (95.3 kg)  09/28/19 210 lb 14.4 oz (95.7 kg)  09/19/19 215 lb 8 oz (97.8 kg)     ASSESSMENT & PLAN:   1. ***  Disposition: F/u with Dr. Rockey Situ or an APP in ***.   Medication Adjustments/Labs and Tests Ordered: Current medicines are reviewed at length with the patient today.  Concerns regarding medicines are outlined above. Medication changes, Labs and Tests ordered today are summarized above and listed in the Patient Instructions accessible in Encounters.   Signed, Christell Faith, PA-C 09/30/2019 12:18 PM     [address] [place] [address], [place] [address] [phone]

## 2019-10-01 ENCOUNTER — Encounter (HOSPITAL_COMMUNITY)
Admission: EM | Disposition: A | Discharge status: Home / Self Care | Payer: Self-pay | Source: Home / Self Care | Attending: Cardiology

## 2019-10-01 DIAGNOSIS — I25118 Atherosclerotic heart disease of native coronary artery with other forms of angina pectoris: Secondary | ICD-10-CM

## 2019-10-01 DIAGNOSIS — Z794 Long term (current) use of insulin: Secondary | ICD-10-CM

## 2019-10-01 DIAGNOSIS — E118 Type 2 diabetes mellitus with unspecified complications: Secondary | ICD-10-CM

## 2019-10-01 DIAGNOSIS — E782 Mixed hyperlipidemia: Secondary | ICD-10-CM

## 2019-10-01 DIAGNOSIS — G4733 Obstructive sleep apnea (adult) (pediatric): Secondary | ICD-10-CM

## 2019-10-01 DIAGNOSIS — I25110 Atherosclerotic heart disease of native coronary artery with unstable angina pectoris: Principal | ICD-10-CM

## 2019-10-01 HISTORY — PX: CORONARY STENT INTERVENTION: CATH118234

## 2019-10-01 HISTORY — PX: LEFT HEART CATH AND CORONARY ANGIOGRAPHY: CATH118249

## 2019-10-01 HISTORY — PX: CORONARY ULTRASOUND/IVUS: CATH118244

## 2019-10-01 LAB — CBC
HCT: 43 % (ref 39.0–52.0)
Hemoglobin: 14.1 g/dL (ref 13.0–17.0)
MCH: 29.8 pg (ref 26.0–34.0)
MCHC: 32.8 g/dL (ref 30.0–36.0)
MCV: 90.9 fL (ref 80.0–100.0)
Platelets: 205 10*3/uL (ref 150–400)
RBC: 4.73 MIL/uL (ref 4.22–5.81)
RDW: 13.7 % (ref 11.5–15.5)
WBC: 7.7 10*3/uL (ref 4.0–10.5)
nRBC: 0 % (ref 0.0–0.2)

## 2019-10-01 LAB — POCT ACTIVATED CLOTTING TIME
Activated Clotting Time: 285 seconds
Activated Clotting Time: 318 seconds
Activated Clotting Time: 268 seconds
Activated Clotting Time: 290 seconds

## 2019-10-01 LAB — GLUCOSE, CAPILLARY
Glucose-Capillary: 196 mg/dL — ABNORMAL HIGH (ref 70–99)
Glucose-Capillary: 132 mg/dL — ABNORMAL HIGH (ref 70–99)
Glucose-Capillary: 151 mg/dL — ABNORMAL HIGH (ref 70–99)
Glucose-Capillary: 119 mg/dL — ABNORMAL HIGH (ref 70–99)
Glucose-Capillary: 201 mg/dL — ABNORMAL HIGH (ref 70–99)

## 2019-10-01 LAB — BASIC METABOLIC PANEL
Anion gap: 12 (ref 5–15)
BUN: 16 mg/dL (ref 8–23)
CO2: 21 mmol/L — ABNORMAL LOW (ref 22–32)
Calcium: 8.8 mg/dL — ABNORMAL LOW (ref 8.9–10.3)
Chloride: 106 mmol/L (ref 98–111)
Creatinine, Ser: 0.95 mg/dL (ref 0.61–1.24)
GFR calc Af Amer: >60 mL/min (ref >60)
GFR calc non Af Amer: >60 mL/min (ref >60)
Glucose, Bld: 157 mg/dL — ABNORMAL HIGH (ref 70–99)
Potassium: 4 mmol/L (ref 3.5–5.1)
Sodium: 139 mmol/L (ref 135–145)

## 2019-10-01 LAB — SARS CORONAVIRUS 2 (TAT 6-24 HRS): SARS Coronavirus 2: NEGATIVE

## 2019-10-01 LAB — HEPARIN LEVEL (UNFRACTIONATED): Heparin Unfractionated: 0.18 IU/mL — ABNORMAL LOW (ref 0.30–0.70)

## 2019-10-01 SURGERY — CORONARY STENT INTERVENTION
Anesthesia: LOCAL

## 2019-10-01 MED ORDER — VERAPAMIL HCL 2.5 MG/ML IV SOLN
INTRAVENOUS | Status: AC
  Filled 2019-10-01: qty 2

## 2019-10-01 MED ORDER — MIDAZOLAM HCL 2 MG/2ML IJ SOLN
INTRAMUSCULAR | Status: DC | PRN
  Administered 2019-10-01 (×2): 1 mg via INTRAVENOUS

## 2019-10-01 MED ORDER — LIDOCAINE HCL (PF) 1 % IJ SOLN
INTRAMUSCULAR | Status: AC
  Filled 2019-10-01: qty 60

## 2019-10-01 MED ORDER — SODIUM CHLORIDE 0.9 % IV SOLN: INTRAVENOUS | Status: AC

## 2019-10-01 MED ORDER — HYDRALAZINE HCL 20 MG/ML IJ SOLN: 10.0000 mg | INTRAMUSCULAR | Status: AC | PRN

## 2019-10-01 MED ORDER — NITROGLYCERIN 1 MG/10 ML FOR IR/CATH LAB
INTRA_ARTERIAL | Status: AC
  Filled 2019-10-01: qty 10

## 2019-10-01 MED ORDER — FENTANYL CITRATE (PF) 100 MCG/2ML IJ SOLN
INTRAMUSCULAR | Status: DC | PRN
  Administered 2019-10-01 (×3): 25 ug via INTRAVENOUS

## 2019-10-01 MED ORDER — HEPARIN (PORCINE) IN NACL 1000-0.9 UT/500ML-% IV SOLN
INTRAVENOUS | Status: AC
  Filled 2019-10-01: qty 1000

## 2019-10-01 MED ORDER — SODIUM CHLORIDE 0.9% FLUSH
3.0000 mL | Freq: Two times a day (BID) | INTRAVENOUS | Status: DC
  Administered 2019-10-01 – 2019-10-02 (×2): 3 mL via INTRAVENOUS

## 2019-10-01 MED ORDER — VERAPAMIL HCL 2.5 MG/ML IV SOLN
INTRAVENOUS | Status: DC | PRN
  Administered 2019-10-01: 10 mL via INTRA_ARTERIAL

## 2019-10-01 MED ORDER — HEPARIN SODIUM (PORCINE) 1000 UNIT/ML IJ SOLN
INTRAMUSCULAR | Status: DC | PRN
  Administered 2019-10-01 (×2): 2000 [IU] via INTRAVENOUS
  Administered 2019-10-01: 8000 [IU] via INTRAVENOUS

## 2019-10-01 MED ORDER — MIDAZOLAM HCL 2 MG/2ML IJ SOLN
INTRAMUSCULAR | Status: AC
  Filled 2019-10-01: qty 2

## 2019-10-01 MED ORDER — SODIUM CHLORIDE 0.9 % IV SOLN: 250.0000 mL | INTRAVENOUS | Status: DC | PRN

## 2019-10-01 MED ORDER — SODIUM CHLORIDE 0.9% FLUSH: 3.0000 mL | INTRAVENOUS | Status: DC | PRN

## 2019-10-01 MED ORDER — HEPARIN SODIUM (PORCINE) 1000 UNIT/ML IJ SOLN
INTRAMUSCULAR | Status: AC
  Filled 2019-10-01: qty 1

## 2019-10-01 MED ORDER — IOHEXOL 350 MG/ML SOLN
INTRAVENOUS | Status: DC | PRN
  Administered 2019-10-01: 200 mL

## 2019-10-01 MED ORDER — NITROGLYCERIN 1 MG/10 ML FOR IR/CATH LAB
INTRA_ARTERIAL | Status: DC | PRN
  Administered 2019-10-01 (×2): 200 ug

## 2019-10-01 MED ORDER — FENTANYL CITRATE (PF) 100 MCG/2ML IJ SOLN
INTRAMUSCULAR | Status: AC
  Filled 2019-10-01: qty 2

## 2019-10-01 MED ORDER — ENOXAPARIN SODIUM 40 MG/0.4ML ~~LOC~~ SOLN
40.0000 mg | SUBCUTANEOUS | Status: DC
  Administered 2019-10-02: 40 mg via SUBCUTANEOUS
  Filled 2019-10-01: qty 0.4

## 2019-10-01 MED ORDER — CLOPIDOGREL BISULFATE 300 MG PO TABS
ORAL_TABLET | ORAL | Status: AC
  Filled 2019-10-01: qty 1

## 2019-10-01 MED ORDER — LIDOCAINE HCL (PF) 1 % IJ SOLN
INTRAMUSCULAR | Status: DC | PRN
  Administered 2019-10-01: 2 mL

## 2019-10-01 MED ORDER — CLOPIDOGREL BISULFATE 300 MG PO TABS
ORAL_TABLET | ORAL | Status: DC | PRN
  Administered 2019-10-01: 300 mg via ORAL

## 2019-10-01 MED ORDER — HEPARIN (PORCINE) IN NACL 1000-0.9 UT/500ML-% IV SOLN
INTRAVENOUS | Status: DC | PRN
  Administered 2019-10-01 (×2): 500 mL

## 2019-10-01 SURGICAL SUPPLY — 23 items
BALLN SAPPHIRE 2.0X12 (BALLOONS) ×2
BALLN SAPPHIRE ~~LOC~~ 2.5X15 (BALLOONS) ×2 IMPLANT
BALLN WOLVERINE 3.00X10 (BALLOONS) ×2
BALLN ~~LOC~~ EMERGE MR 4.0X8 (BALLOONS) ×2
BALLOON SAPPHIRE 2.0X12 (BALLOONS) ×1 IMPLANT
BALLOON WOLVERINE 3.00X10 (BALLOONS) ×1 IMPLANT
BALLOON ~~LOC~~ EMERGE MR 4.0X8 (BALLOONS) ×1 IMPLANT
CATH INFINITI JR4 5F (CATHETERS) ×2 IMPLANT
CATH OPTICROSS 40MHZ (CATHETERS) ×2 IMPLANT
CATH VISTA GUIDE 6FR JL3.5 SH (CATHETERS) ×2 IMPLANT
DEVICE RAD COMP TR BAND LRG (VASCULAR PRODUCTS) ×2 IMPLANT
GLIDESHEATH SLEND SS 6F .021 (SHEATH) ×2 IMPLANT
GUIDEWIRE INQWIRE 1.5J.035X260 (WIRE) ×1 IMPLANT
INQWIRE 1.5J .035X260CM (WIRE) ×2
KIT ENCORE 26 ADVANTAGE (KITS) ×2 IMPLANT
KIT HEART LEFT (KITS) ×2 IMPLANT
PACK CARDIAC CATHETERIZATION (CUSTOM PROCEDURE TRAY) ×2 IMPLANT
SLED PULL BACK IVUS (MISCELLANEOUS) ×2 IMPLANT
STENT RESOLUTE ONYX 2.25X26 (Permanent Stent) ×2 IMPLANT
STENT RESOLUTE ONYX 3.5X12 (Permanent Stent) ×2 IMPLANT
TRANSDUCER W/STOPCOCK (MISCELLANEOUS) ×2 IMPLANT
TUBING CIL FLEX 10 FLL-RA (TUBING) ×2 IMPLANT
WIRE RUNTHROUGH .014X180CM (WIRE) ×2 IMPLANT

## 2019-10-01 NOTE — Progress Notes (Signed)
Back from the cath lab by bed awake and alert, TR Band to right wrist intact, elevated with pillow, pulse ox to right thumb, instructed to avoid moving right arm and to call for any sign of bleeding.

## 2019-10-01 NOTE — Plan of Care (Signed)

## 2019-10-01 NOTE — Progress Notes (Signed)
After 79ml of air taken out, started bleeding. TR band inflated with air total 9 ml. After 30 min started deflating air and completed . No bleeding noted continue to monitor.

## 2019-10-01 NOTE — Progress Notes (Addendum)
Progress Note  Patient Name: [name] [name] Date of Encounter: 10/01/2019  Primary Cardiologist: Ida Rogue, MD   Subjective   Feeling well this morning. No chest pain.   Inpatient Medications    Scheduled Meds:  aspirin EC  81 mg Oral Daily   canagliflozin  300 mg Oral QAC breakfast   Chlorhexidine Gluconate Cloth  6 each Topical Daily   clopidogrel  75 mg Oral Q breakfast   insulin aspart  0-15 Units Subcutaneous TID WC   lisinopril  20 mg Oral Daily   nitroGLYCERIN  0.5 inch Topical Q6H   pioglitazone  30 mg Oral Daily   sodium chloride flush  3 mL Intravenous Q12H   sodium chloride flush  3 mL Intravenous Q12H   tamsulosin  0.4 mg Oral Daily   Continuous Infusions:  sodium chloride     sodium chloride     sodium chloride 125 mL/hr at 10/01/19 0504   heparin 1,150 Units/hr (10/01/19 0846)   PRN Meds: sodium chloride, sodium chloride, acetaminophen, HYDROcodone-acetaminophen, nitroGLYCERIN, ondansetron (ZOFRAN) IV, sodium chloride flush, sodium chloride flush   Vital Signs    Vitals:   09/30/19 1935 09/30/19 2323 10/01/19 0447 10/01/19 0817  BP: 138/72 125/68 139/70 127/74  Pulse: 72 74 65 61  Resp: 17 14 18 15   Temp: 98 F (36.7 C) 98.4 F (36.9 C) 98.3 F (36.8 C) 98 F (36.7 C)  TempSrc: Oral Oral Oral Oral  SpO2: 98% 97% 98% 99%  Weight:   93.8 kg   Height:        Intake/Output Summary (Last 24 hours) at 10/01/2019 0931 Last data filed at 10/01/2019 0800 Gross per 24 hour  Intake 915.07 ml  Output 400 ml  Net 515.07 ml   Last 3 Weights 10/01/2019 09/30/2019 09/28/2019  Weight (lbs) 206 lb 12.7 oz 210 lb 210 lb 14.4 oz  Weight (kg) 93.8 kg 95.255 kg 95.664 kg      Telemetry    SR - Personally Reviewed  ECG    SR with 1st degree AVB- Personally Reviewed  Physical Exam  Pleasant WM, sitting up in chair GEN: No acute distress.   Neck: No JVD -very difficult to assess Cardiac: RRR, no murmurs, rubs, or gallops.   Respiratory: Clear to auscultation bilaterally.  Nonlabored, good air movement GI: Soft, nontender, non-distended  MS: No edema; No deformity. Neuro:  Nonfocal  Psych: Normal affect   Labs    High Sensitivity Troponin:   Recent Labs  Lab 09/30/19 0921 09/30/19 1239  TROPONINIHS 21* 22*      Chemistry Recent Labs  Lab 09/28/19 0618 09/30/19 0921 10/01/19 0756  NA 137 137 139  K 3.8 4.0 4.0  CL 105 104 106  CO2 24 22 21*  GLUCOSE 158* 195* 157*  BUN 20 13 16   CREATININE 0.84 0.96 0.95  CALCIUM 8.4* 9.2 8.8*  GFRNONAA >60 >60 >60  GFRAA >60 >60 >60  ANIONGAP 8 11 12      Hematology Recent Labs  Lab 09/28/19 0618 09/30/19 0921 10/01/19 0756  WBC 8.7 7.4 7.7  RBC 4.83 4.93 4.73  HGB 14.1 14.7 14.1  HCT 43.2 44.2 43.0  MCV 89.4 89.7 90.9  MCH 29.2 29.8 29.8  MCHC 32.6 33.3 32.8  RDW 13.6 13.5 13.7  PLT 201 207 205    BNPNo results for input(s): BNP, PROBNP in the last 168 hours.   DDimer No results for input(s): DDIMER in the last 168 hours.   Radiology  Dg Chest 2 View  Result Date: 09/30/2019 CLINICAL DATA:  Chest pain and shortness of breath EXAM: CHEST - 2 VIEW COMPARISON:  Chest radiograph dated 09/05/2019 FINDINGS: The heart remains enlarged. Median sternotomy wires are redemonstrated. The lungs are clear. There is no pleural effusion or pneumothorax. Degenerative changes are seen in the spine. IMPRESSION: No active cardiopulmonary disease. Electronically Signed   By: Zerita Boers M.D.   On: 09/30/2019 10:13    Cardiac Studies   Cath: 09/27/19   Ost LM to Mid LM lesion is 50% stenosed.  Ost LAD to Prox LAD lesion is 100% stenosed.  1st Mrg lesion is 90% stenosed.  3rd Mrg lesion is 40% stenosed.  Mid RCA lesion is 50% stenosed.  Ost RCA to Prox RCA lesion is 70% stenosed.  A drug-eluting stent was successfully placed using a STENT RESOLUTE ONYX 3.0X26.  Post intervention, there is a 5% residual stenosis.   Successful angioplasty  and drug-eluting stent placement to the proximal right coronary artery.  This was significant by IFR at 0.88.  The lesion in the distal RCA was not significant by IFR.  Recommendations: Continue dual antiplatelet therapy for at least 6 months. Aggressive treatment of risk factors. I was planning to perform PCI in OM1 at the same session.  However, with guide catheter engagement of the left main, there was pressure dampening and the left main stenosis appeared to be significant.  This will require a more complex procedure and its best to stage this.  The patient will require protected left main PCI in addition to [place] PCI.  This can be scheduled in 1 to 2 weeks.  I discussed this with Dr. Candis Musa.  Diagnostic Dominance: Right  Intervention    Patient Profile     68 y.o. male h/o CAD status post three-vessel bypass in December 2012, hypertension, hyperlipidemia, statin intolerance, type 2 diabetes mellitus, BPH, chronic pain, obesity, and sleep apnea on CPAP, who presented to the emergency department with unstable angina after recent percutaneous coronary intervention of the right coronary artery with known residual disease.  Assessment & Plan    1.  Unstable Angina/CAD:  Pt w/h/o CAD s/p 3 vessel CABG in 2012, who was recently seen in clinic w/ recurrent nocturnal dyspnea and c/p - similar to prior anginal equivalent.  He underwent cath on 10/22, revealing moderate LM, severe OM1, and moderate RCA dzs (w/ abnl iFR).  3/3 patent grafts.  The RCA was intervened upon w/ a plan for staged PCI of the LM and OM1 on 11/4 w/ Dr. Fletcher Anon. Presented back to the ED with chest pain and dyspnea yesterday morning. Placed on IV heparin with plans for cath today --> with PCI likely to left main and OM1. -- on ASA, plavix  2.  Essential HTN:  Stable with current therapy. Consider adding low dose BB after cath as HRs have in the mid 60s.   3.  HL:  Statin intolerant.  Has previously wished to avoid zetia and/or  pcsk9i though now seems more open to this idea.  Will plan to refer to Lipid clinic as an outpatient. --Spent at least 5 to 10 minutes talking with him but the importance of lipid management and the benefits of PCSK9 inhibitor.  4.  DMII:  Will hold metformin. On Invokana, Actos. Would favor stopping Actos this admission.  On SSI.  5.  OSA:  CPAP.  6.  BPH:  Cont flomax.  For questions or updates, please contact [place] Please consult  [email or web address] for contact info under   Signed, Reino Bellis, NP  10/01/2019, 9:31 AM     ATTENDING ATTESTATION  I have seen, examined and evaluated the patient this AM along with Reino Bellis, NP-C.  After reviewing all the available data and chart, we discussed the patients laboratory, study & physical findings as well as symptoms in detail. I agree with her findings, examination as well as impression recommendations as per our discussion.    Attending adjustments noted in italics.   [name] [name] essentially is forced the issue with coming in with again unstable angina in the setting of significant OM lesion and left main disease.  The OM lesion appears to be relatively straightforward PCI, however the left main may actually require potential atherectomy.  It does appear to be calcified, but would likely be able to be treated with balloon and stent.  He is already on dual antiplatelet therapy.  He is on multiple medications for diabetes, would stop his pioglitazone on discharge.  He is on Invokana as an SGLT2 inhibitor. Euvolemic on exam.   Plan is PCI today with hopefully discharge tomorrow stable.   Glenetta Hew, M.D., M.S. Interventional Cardiologist   Pager # [phone] Phone # [phone] [address] [place], [place] [address]

## 2019-10-01 NOTE — Progress Notes (Signed)
Transported to the cath lab by bed awake and alert. 

## 2019-10-01 NOTE — Interval H&P Note (Signed)
History and Physical Interval Note:  10/01/2019 1:20 PM  [name] [name]  has presented today for cardiac catheterization, with the diagnosis of unstable angina.  The various methods of treatment have been discussed with the patient and family. After consideration of risks, benefits and other options for treatment, the patient has consented to  Procedure(s): CORONARY STENT INTERVENTION (N/A) as a surgical intervention.  The patient's history has been reviewed, patient examined, no change in status, stable for surgery.  I have reviewed the patient's chart and labs.  Questions were answered to the patient's satisfaction.    Cath Lab Visit (complete for each Cath Lab visit)  Clinical Evaluation Leading to the Procedure:   ACS: Yes.    Non-ACS:  N/A  Sherese Heyward

## 2019-10-01 NOTE — Progress Notes (Signed)
[place] for heparin Indication: chest pain/ACS  Heparin Dosing Weight: 80.4 kg  Labs: Recent Labs    09/30/19 0921 09/30/19 2027 10/01/19 0756  HGB 14.7  --  14.1  HCT 44.2  --  43.0  PLT 207  --  205  HEPARINUNFRC  --  0.10* 0.18*  CREATININE 0.96  --  0.95    Assessment: 23 yom presenting with unstable again after recent PCI of R coronary artery with known residual disease. Pharmacy consulted to dose heparin for ACS. Not on anticoagulation PTA. CBC wnl. -initial heparin level= 0.18 -CBC stable, no bleeding noted  Goal of Therapy:  Heparin level 0.3-0.7 units/ml Monitor platelets by anticoagulation protocol: Yes   Plan: Increase heparin to 1350/hr Follow up after cath  Erin Hearing PharmD., BCPS Clinical Pharmacist 10/01/2019 10:05 AM

## 2019-10-01 NOTE — Progress Notes (Signed)
TR Band removed, 2x2 gauze and tegaderm applied. No bleeding noted.

## 2019-10-01 NOTE — H&P (View-Only) (Signed)
Progress Note  Patient Name: [name] [name] Date of Encounter: 10/01/2019  Primary Cardiologist: Ida Rogue, MD   Subjective   Feeling well this morning. No chest pain.   Inpatient Medications    Scheduled Meds:  aspirin EC  81 mg Oral Daily   canagliflozin  300 mg Oral QAC breakfast   Chlorhexidine Gluconate Cloth  6 each Topical Daily   clopidogrel  75 mg Oral Q breakfast   insulin aspart  0-15 Units Subcutaneous TID WC   lisinopril  20 mg Oral Daily   nitroGLYCERIN  0.5 inch Topical Q6H   pioglitazone  30 mg Oral Daily   sodium chloride flush  3 mL Intravenous Q12H   sodium chloride flush  3 mL Intravenous Q12H   tamsulosin  0.4 mg Oral Daily   Continuous Infusions:  sodium chloride     sodium chloride     sodium chloride 125 mL/hr at 10/01/19 0504   heparin 1,150 Units/hr (10/01/19 0846)   PRN Meds: sodium chloride, sodium chloride, acetaminophen, HYDROcodone-acetaminophen, nitroGLYCERIN, ondansetron (ZOFRAN) IV, sodium chloride flush, sodium chloride flush   Vital Signs    Vitals:   09/30/19 1935 09/30/19 2323 10/01/19 0447 10/01/19 0817  BP: 138/72 125/68 139/70 127/74  Pulse: 72 74 65 61  Resp: 17 14 18 15   Temp: 98 F (36.7 C) 98.4 F (36.9 C) 98.3 F (36.8 C) 98 F (36.7 C)  TempSrc: Oral Oral Oral Oral  SpO2: 98% 97% 98% 99%  Weight:   93.8 kg   Height:        Intake/Output Summary (Last 24 hours) at 10/01/2019 0931 Last data filed at 10/01/2019 0800 Gross per 24 hour  Intake 915.07 ml  Output 400 ml  Net 515.07 ml   Last 3 Weights 10/01/2019 09/30/2019 09/28/2019  Weight (lbs) 206 lb 12.7 oz 210 lb 210 lb 14.4 oz  Weight (kg) 93.8 kg 95.255 kg 95.664 kg      Telemetry    SR - Personally Reviewed  ECG    SR with 1st degree AVB- Personally Reviewed  Physical Exam  Pleasant WM, sitting up in chair GEN: No acute distress.   Neck: No JVD -very difficult to assess Cardiac: RRR, no murmurs, rubs, or gallops.   Respiratory: Clear to auscultation bilaterally.  Nonlabored, good air movement GI: Soft, nontender, non-distended  MS: No edema; No deformity. Neuro:  Nonfocal  Psych: Normal affect   Labs    High Sensitivity Troponin:   Recent Labs  Lab 09/30/19 0921 09/30/19 1239  TROPONINIHS 21* 22*      Chemistry Recent Labs  Lab 09/28/19 0618 09/30/19 0921 10/01/19 0756  NA 137 137 139  K 3.8 4.0 4.0  CL 105 104 106  CO2 24 22 21*  GLUCOSE 158* 195* 157*  BUN 20 13 16   CREATININE 0.84 0.96 0.95  CALCIUM 8.4* 9.2 8.8*  GFRNONAA >60 >60 >60  GFRAA >60 >60 >60  ANIONGAP 8 11 12      Hematology Recent Labs  Lab 09/28/19 0618 09/30/19 0921 10/01/19 0756  WBC 8.7 7.4 7.7  RBC 4.83 4.93 4.73  HGB 14.1 14.7 14.1  HCT 43.2 44.2 43.0  MCV 89.4 89.7 90.9  MCH 29.2 29.8 29.8  MCHC 32.6 33.3 32.8  RDW 13.6 13.5 13.7  PLT 201 207 205    BNPNo results for input(s): BNP, PROBNP in the last 168 hours.   DDimer No results for input(s): DDIMER in the last 168 hours.   Radiology  Dg Chest 2 View  Result Date: 09/30/2019 CLINICAL DATA:  Chest pain and shortness of breath EXAM: CHEST - 2 VIEW COMPARISON:  Chest radiograph dated 09/05/2019 FINDINGS: The heart remains enlarged. Median sternotomy wires are redemonstrated. The lungs are clear. There is no pleural effusion or pneumothorax. Degenerative changes are seen in the spine. IMPRESSION: No active cardiopulmonary disease. Electronically Signed   By: Zerita Boers M.D.   On: 09/30/2019 10:13    Cardiac Studies   Cath: 09/27/19   Ost LM to Mid LM lesion is 50% stenosed.  Ost LAD to Prox LAD lesion is 100% stenosed.  1st Mrg lesion is 90% stenosed.  3rd Mrg lesion is 40% stenosed.  Mid RCA lesion is 50% stenosed.  Ost RCA to Prox RCA lesion is 70% stenosed.  A drug-eluting stent was successfully placed using a STENT RESOLUTE ONYX 3.0X26.  Post intervention, there is a 5% residual stenosis.   Successful angioplasty  and drug-eluting stent placement to the proximal right coronary artery.  This was significant by IFR at 0.88.  The lesion in the distal RCA was not significant by IFR.  Recommendations: Continue dual antiplatelet therapy for at least 6 months. Aggressive treatment of risk factors. I was planning to perform PCI in OM1 at the same session.  However, with guide catheter engagement of the left main, there was pressure dampening and the left main stenosis appeared to be significant.  This will require a more complex procedure and its best to stage this.  The patient will require protected left main PCI in addition to [place] PCI.  This can be scheduled in 1 to 2 weeks.  I discussed this with Dr. Candis Musa.  Diagnostic Dominance: Right  Intervention    Patient Profile     68 y.o. male h/o CAD status post three-vessel bypass in December 2012, hypertension, hyperlipidemia, statin intolerance, type 2 diabetes mellitus, BPH, chronic pain, obesity, and sleep apnea on CPAP, who presented to the emergency department with unstable angina after recent percutaneous coronary intervention of the right coronary artery with known residual disease.  Assessment & Plan    1.  Unstable Angina/CAD:  Pt w/h/o CAD s/p 3 vessel CABG in 2012, who was recently seen in clinic w/ recurrent nocturnal dyspnea and c/p - similar to prior anginal equivalent.  He underwent cath on 10/22, revealing moderate LM, severe OM1, and moderate RCA dzs (w/ abnl iFR).  3/3 patent grafts.  The RCA was intervened upon w/ a plan for staged PCI of the LM and OM1 on 11/4 w/ Dr. Fletcher Anon. Presented back to the ED with chest pain and dyspnea yesterday morning. Placed on IV heparin with plans for cath today --> with PCI likely to left main and OM1. -- on ASA, plavix  2.  Essential HTN:  Stable with current therapy. Consider adding low dose BB after cath as HRs have in the mid 60s.   3.  HL:  Statin intolerant.  Has previously wished to avoid zetia and/or  pcsk9i though now seems more open to this idea.  Will plan to refer to Lipid clinic as an outpatient. --Spent at least 5 to 10 minutes talking with him but the importance of lipid management and the benefits of PCSK9 inhibitor.  4.  DMII:  Will hold metformin. On Invokana, Actos. Would favor stopping Actos this admission.  On SSI.  5.  OSA:  CPAP.  6.  BPH:  Cont flomax.  For questions or updates, please contact [place] Please consult  [email or web address] for contact info under   Signed, Reino Bellis, NP  10/01/2019, 9:31 AM     ATTENDING ATTESTATION  I have seen, examined and evaluated the patient this AM along with Reino Bellis, NP-C.  After reviewing all the available data and chart, we discussed the patients laboratory, study & physical findings as well as symptoms in detail. I agree with her findings, examination as well as impression recommendations as per our discussion.    Attending adjustments noted in italics.   Mr. Johnnye Sima essentially is forced the issue with coming in with again unstable angina in the setting of significant OM lesion and left main disease.  The OM lesion appears to be relatively straightforward PCI, however the left main may actually require potential atherectomy.  It does appear to be calcified, but would likely be able to be treated with balloon and stent.  He is already on dual antiplatelet therapy.  He is on multiple medications for diabetes, would stop his pioglitazone on discharge.  He is on Invokana as an SGLT2 inhibitor. Euvolemic on exam.   Plan is PCI today with hopefully discharge tomorrow stable.   Glenetta Hew, M.D., M.S. Interventional Cardiologist   Pager # [phone] Phone # [phone] [address] [place],  [address]

## 2019-10-02 ENCOUNTER — Encounter (HOSPITAL_COMMUNITY): Payer: Self-pay | Admitting: Internal Medicine

## 2019-10-02 ENCOUNTER — Ambulatory Visit: Payer: BC Managed Care – PPO | Admitting: Physician Assistant

## 2019-10-02 DIAGNOSIS — Z9861 Coronary angioplasty status: Secondary | ICD-10-CM

## 2019-10-02 DIAGNOSIS — I2510 Atherosclerotic heart disease of native coronary artery without angina pectoris: Secondary | ICD-10-CM

## 2019-10-02 DIAGNOSIS — E785 Hyperlipidemia, unspecified: Secondary | ICD-10-CM

## 2019-10-02 DIAGNOSIS — E1169 Type 2 diabetes mellitus with other specified complication: Secondary | ICD-10-CM

## 2019-10-02 LAB — BASIC METABOLIC PANEL
Anion gap: 9 (ref 5–15)
BUN: 16 mg/dL (ref 8–23)
CO2: 22 mmol/L (ref 22–32)
Calcium: 8.7 mg/dL — ABNORMAL LOW (ref 8.9–10.3)
Chloride: 105 mmol/L (ref 98–111)
Creatinine, Ser: 0.88 mg/dL (ref 0.61–1.24)
GFR calc Af Amer: >60 mL/min (ref >60)
GFR calc non Af Amer: >60 mL/min (ref >60)
Glucose, Bld: 175 mg/dL — ABNORMAL HIGH (ref 70–99)
Potassium: 3.9 mmol/L (ref 3.5–5.1)
Sodium: 136 mmol/L (ref 135–145)

## 2019-10-02 LAB — CBC
HCT: 42.3 % (ref 39.0–52.0)
Hemoglobin: 13.8 g/dL (ref 13.0–17.0)
MCH: 29.6 pg (ref 26.0–34.0)
MCHC: 32.6 g/dL (ref 30.0–36.0)
MCV: 90.6 fL (ref 80.0–100.0)
Platelets: 224 10*3/uL (ref 150–400)
RBC: 4.67 MIL/uL (ref 4.22–5.81)
RDW: 13.8 % (ref 11.5–15.5)
WBC: 7.6 10*3/uL (ref 4.0–10.5)
nRBC: 0 % (ref 0.0–0.2)

## 2019-10-02 LAB — GLUCOSE, CAPILLARY
Glucose-Capillary: 155 mg/dL — ABNORMAL HIGH (ref 70–99)
Glucose-Capillary: 183 mg/dL — ABNORMAL HIGH (ref 70–99)

## 2019-10-02 NOTE — Progress Notes (Signed)
CARDIAC REHAB PHASE I   PRE:  Rate/Rhythm: 66 SR    BP: sitting 140/68    SaO2: 96 RA  MODE:  Ambulation: 340 ft   POST:  Rate/Rhythm: 82 SR    BP: sitting 144/71     SaO2: 96 RA  Tolerated well, no c/o. Discussed stent, Plavix, restrictions, diet, exercise, NTG, and CRPII. Will refer to [facility] however pt is not interested. Not interested in virtual program either. He feels he gets enough activity. I encouraged him to focus on cardiovascular exercise as well.  Cath[place], ACSM 10/02/2019 9:08 AM

## 2019-10-02 NOTE — Discharge Summary (Addendum)
Discharge Summary    Patient ID: GRANVILL HASELTON,  MRN: [id number], DOB/AGE: 69-Jun-1952 69 y.o.  Admit date: 09/30/2019 Discharge date: 10/02/2019  Primary Care Provider: Viviana Simpler I Primary Cardiologist: Ida Rogue, MD  Discharge Diagnoses    Principal Problem:   Unstable angina [facility]) Active Problems:   Hyperlipidemia   Essential hypertension   OSA (obstructive sleep apnea)   Coronary artery disease of native artery of native heart with stable angina pectoris ([place])   Controlled diabetes mellitus type 2 with complications (HCC)   CAD S/P DES PCI RCA, LM & OM1, Patent LIMA-LAD & SeqSVG-D1-D2   Allergies Allergies  Allergen Reactions   Doxazosin Mesylate Other (See Comments)    REACTION: didn't work and may have caused SOB   Tetracyclines & Related    Carvedilol Other (See Comments)    Felt bad   Crestor [Rosuvastatin] Other (See Comments)    Muscle aches.   Doxycycline Other (See Comments)    REACTION: unspecified   Glimepiride Other (See Comments)    REACTION: red eyes   Glipizide Other (See Comments)    REACTION: myalgia??   Losartan Potassium-Hctz Other (See Comments)    REACTION: chest \\T \ leg pain   Metoprolol Tartrate Other (See Comments)    Mood swings, memory changes   Pravastatin Other (See Comments)    Muscle aches    Rosiglitazone Maleate Other (See Comments)    REACTION: myalgia   Simvastatin Other (See Comments)    REACTION: myalgias    Diagnostic Studies/Procedures    Cath: 10/01/19  Conclusions: 1. Three-vessel coronary artery disease, including 60% ostial/proximal LMCA stenosis (MLA 5.4 mm^2 by IVUS), chronic total occlusion of the proximal LAD (known patent LIMA-LAD and SVG-D1 and D2), 90% OM1 stenosis, and 50% mid/distal RCA stenosis. 2. Widely patent proximal RCA stent. 3. Normal left ventricular filling pressure. 4. Successful IVUS-guided PCI to the ostial through mid LMCA using Resolute Onyx 3.5 12 mm  drug-eluting stent (postdilated to 4.2 mm) with 0% residual stenosis and TIMI-3 flow. 5. Successful PCI to OM1 using Resolute Onyx 2.25 x 26 mm drug-eluting stent with 0% residual stenosis and TIMI-3 flow.  Recommendations: 1. Indefinite dual antiplatelet therapy with aspirin and clopidogrel. 2. Aggressive secondary prevention. 3. Anticipate discharge home tomorrow if no post-catheterization complications.  Nelva Bush, MD  Diagnostic Dominance: Right  Intervention    _____________   History of Present Illness     68 year old male with a complex past medical history including coronary artery disease status post three-vessel bypass in December 2012, hypertension, hyperlipidemia, statin intolerance, type 2 diabetes mellitus, BPH, chronic pain, morbid obesity, and sleep apnea on CPAP.  He was recently evaluated in cardiology clinic with a report of 6-week history of intermittent nocturnal chest pressure with, which he identified as being similar to what he experienced prior to his bypass.  He also noted some decrease in activity tolerance.  Given symptoms concerning for unstable angina, he was placed on long-acting nitrate therapy and decision was made to pursue diagnostic catheterization.  Unfortunately, he did not tolerate nitrates secondary to headache.  He underwent diagnostic catheterization on October 22 which revealed moderate ostial left main disease with a known occlusion of the LAD, patent LIMA to the LAD, patent sequential vein graft to the D1 and D3, 90% stenosis in the OM1, and a 70% stenosis in the ostial/proximal RCA.  Films were reviewed by interventional cardiology and iFR was performed in the RCA and was abnormal at 0.88.  Therefore, the ostial/proximal RCA was successfully treated with a 3.0 x 26 mm resolute Onyx drug-eluting stent.  It was felt that the patient would benefit from left main and OM1 intervention however, given contrast load and fluoroscopy time up to that  point, it was felt that this would be better performed in a staged fashion.  He was discharged home October 23 with plan for staged PCI of the left main and OM 1 to be performed at [facility] by Dr. Fletcher Anon on November 4.  Patient felt well following discharge however, he awoke the morning of admission at 4:30 AM with recurrent dyspnea and chest pressure.  He took nitroglycerin with some but not complete relief.  As dyspnea and mild chest pressure persisted, he presented to the [facility] ED. ECG showed inferolateral ST depression which was similar to his October 14 ECG.  Initial high-sensitivity troponin was mildly elevated at 21.  Lab work otherwise unremarkable.  He said his symptoms improved but then he was given an aspirin and he had return of mild chest pressure and dyspnea.  He was admitted and placed on IV heparin with plans to cardiac cath to intervene on LM and OM1 the following day.   Hospital Course     Underwent cardiac cath noted above with Dr. Saunders Revel with successful IVUS guided PCI/DESx1 to the ostial/mid LM along with successful PCI/DES x1 to OM1.  Plan to continue with dual antiplatelet therapy with aspirin and Plavix indefinitely.  No complications noted post cath.  He ambulated with cardiac rehab without recurrent chest pain.  He was continued on his home medications with the exception of Actos which was stopped at the time of discharge.  Of note he has not been on metoprolol given history of intolerance.  Did discuss again the option of PCSK9 inhibitors.  Patient was open to the idea and a referral to the lipid clinic was placed at discharge.  General: Well developed, well nourished, male appearing in no acute distress. Head: Normocephalic, atraumatic.  Neck: Supple without bruits, JVD. Lungs:  Resp regular and unlabored, CTA. Heart: RRR, S1, S2, no S3, S4, or murmur; no rub. Abdomen: Soft, non-tender, non-distended with normoactive bowel sounds. No hepatomegaly. No rebound/guarding. No obvious  abdominal masses. Extremities: No clubbing, cyanosis, edema. Distal pedal pulses are 2+ bilaterally. Right radial cath site stable without bruising or hematoma Neuro: Alert and oriented X 3. Moves all extremities spontaneously. Psych: Normal affect.  Neita Goodnight was seen by Dr. Ellyn Hack and determined stable for discharge home. Follow up in the office has been arranged. Medications are listed below.   _____________  Discharge Vitals Blood pressure 138/71, pulse 80, temperature 98.7 F (37.1 C), temperature source Oral, resp. rate 17, height 5\' 4"  (1.626 m), weight 94.7 kg, SpO2 98 %.  Filed Weights   09/30/19 0918 10/01/19 0447 10/02/19 0300  Weight: 95.3 kg 93.8 kg 94.7 kg    Labs & Radiologic Studies    CBC Recent Labs    10/01/19 0756 10/02/19 0302  WBC 7.7 7.6  HGB 14.1 13.8  HCT 43.0 42.3  MCV 90.9 90.6  PLT 205 [id number]   Basic Metabolic Panel Recent Labs    10/01/19 0756 10/02/19 0302  NA 139 136  K 4.0 3.9  CL 106 105  CO2 21* 22  GLUCOSE 157* 175*  BUN 16 16  CREATININE 0.95 0.88  CALCIUM 8.8* 8.7*   Liver Function Tests No results for input(s): AST, ALT, ALKPHOS, BILITOT, PROT, ALBUMIN in the last  72 hours. No results for input(s): LIPASE, AMYLASE in the last 72 hours. Cardiac Enzymes No results for input(s): CKTOTAL, CKMB, CKMBINDEX, TROPONINI in the last 72 hours. BNP Invalid input(s): POCBNP D-Dimer No results for input(s): DDIMER in the last 72 hours. Hemoglobin A1C No results for input(s): HGBA1C in the last 72 hours. Fasting Lipid Panel No results for input(s): CHOL, HDL, LDLCALC, TRIG, CHOLHDL, LDLDIRECT in the last 72 hours. Thyroid Function Tests No results for input(s): TSH, T4TOTAL, T3FREE, THYROIDAB in the last 72 hours.  Invalid input(s): FREET3 _____________  Dg Chest 2 View  Result Date: 09/30/2019 CLINICAL DATA:  Chest pain and shortness of breath EXAM: CHEST - 2 VIEW COMPARISON:  Chest radiograph dated 09/05/2019 FINDINGS:  The heart remains enlarged. Median sternotomy wires are redemonstrated. The lungs are clear. There is no pleural effusion or pneumothorax. Degenerative changes are seen in the spine. IMPRESSION: No active cardiopulmonary disease. Electronically Signed   By: Zerita Boers M.D.   On: 09/30/2019 10:13   Dg Chest 2 View  Result Date: 09/05/2019 CLINICAL DATA:  Shortness of breath. EXAM: CHEST - 2 VIEW COMPARISON:  Radiograph October 05, 2012. FINDINGS: The heart size and mediastinal contours are within normal limits. Status post coronary bypass graft. No pneumothorax or pleural effusion is noted. Both lungs are clear. The visualized skeletal structures are unremarkable. IMPRESSION: No active cardiopulmonary disease. Electronically Signed   By: Marijo Conception M.D.   On: 09/05/2019 15:46   Vas [place] Lower Ext Art Seg Multi (segmentals & [place])  Result Date: 09/19/2019 LOWER EXTREMITY DOPPLER STUDY Indications: Claudication. High Risk Factors: Hypertension, hyperlipidemia, Diabetes, no history of                    smoking, coronary artery disease. Other Factors: Bilateral leg pain with and w/o ambulation. Onset of symptoms was                one month ago and mostly occurs around the knees.  Comparison Study: None Performing Technologist: Pilar Jarvis RVT  Examination Guidelines: A complete evaluation includes at minimum, Doppler waveform signals and systolic blood pressure reading at the level of bilateral brachial, anterior tibial, and posterior tibial arteries, when vessel segments are accessible. Bilateral testing is considered an integral part of a complete examination. Photoelectric Plethysmograph (PPG) waveforms and toe systolic pressure readings are included as required and additional duplex testing as needed. Limited examinations for reoccurring indications may be performed as noted.  ABI Findings: +---------+------------------+-----+---------+--------+  Right     Rt Pressure (mmHg) Index Waveform   Comment   +---------+------------------+-----+---------+--------+  Brachial  169                      triphasic           +---------+------------------+-----+---------+--------+  CFA                                triphasic           +---------+------------------+-----+---------+--------+  Popliteal                          triphasic           +---------+------------------+-----+---------+--------+  ATA       188                1.11  triphasic           +---------+------------------+-----+---------+--------+  PTA       199                1.18  triphasic           +---------+------------------+-----+---------+--------+  PERO      171                1.01  triphasic           +---------+------------------+-----+---------+--------+  Great Toe 138                0.82  Normal              +---------+------------------+-----+---------+--------+ +---------+------------------+-----+---------+-------+  Left      Lt Pressure (mmHg) Index Waveform  Comment  +---------+------------------+-----+---------+-------+  Brachial  162                      triphasic          +---------+------------------+-----+---------+-------+  CFA                                triphasic          +---------+------------------+-----+---------+-------+  Popliteal                          triphasic          +---------+------------------+-----+---------+-------+  ATA       176                1.04  triphasic          +---------+------------------+-----+---------+-------+  PTA       193                1.14  triphasic          +---------+------------------+-----+---------+-------+  PERO      176                1.04  triphasic          +---------+------------------+-----+---------+-------+  Great Toe 170                1.01  Normal             +---------+------------------+-----+---------+-------+ +-------+-----------+-----------+------------+------------+  ABI/TBI Today's ABI Today's TBI Previous ABI Previous TBI   +-------+-----------+-----------+------------+------------+  Right   1.18        0.82                                   +-------+-----------+-----------+------------+------------+  Left    1.14        1.01                                   +-------+-----------+-----------+------------+------------+  Summary: Right: Resting right ankle-brachial index is within normal range. No evidence of significant right lower extremity arterial disease. The right toe-brachial index is normal. Left: Resting left ankle-brachial index is within normal range. No evidence of significant left lower extremity arterial disease. The left toe-brachial index is normal.  *See table(s) above for measurements and observations.  Electronically signed by Quay Burow MD on 09/19/2019 at 1:29:24 PM.    Final    Disposition   Pt is being discharged home today in good condition.  Follow-up Plans & Appointments    Follow-up Information  Rise Mu, PA-C Follow up on 10/09/2019.   Specialties: Physician Assistant, Cardiology, Radiology Why: at 8am for your follow up appt.  Contact information: [place] [address] [phone]          Discharge Instructions    AMB Referral to Advanced Lipid Disorders Clinic   Complete by: As directed    Reason for referral: Patients with statin intolerance (failed 2 statins, one of which must be a high potency statin)   Provider to see patient: PharmD   Internal Lipid Clinic Referral Scheduling  Internal lipid clinic referrals are providers within [facility], who wish to refer established patients for routine management (help in starting PCSK9 inhibitor therapy) or advanced therapies.  Internal MD referral criteria:              1. All patients with LDL>190 mg/dL  2. All patients with Triglycerides >500 mg/dL  3. Patients with suspected or confirmed heterozygous familial hyperlipidemia (HeFH) or homozygous familial hyperlipidemia (HoFH)  4. Patients  with family history of suspicious for genetic dyslipidemia desiring genetic testing  5. Patients refractory to standard guideline based therapy  6. Patients with statin intolerance (failed 2 statins, one of which must be a high potency statin)  7. Patients who the provider desires to be seen by MD   Internal PharmD referral criteria:   1. Follow-up patients for medication management  2. Follow-up for compliance monitoring  3. Patients for drug education  4. Patients with statin intolerance  5. PCSK9 inhibitor education and prior authorization approvals  6. Patients with triglycerides <500 mg/dL  External Lipid Clinic Referral  External lipid clinic referrals are for providers outside of [facility], considered new clinic patients - automatically routed to MD schedule   Amb Referral to Cardiac Rehabilitation   Complete by: As directed    Diagnosis:  Coronary Stents PTCA     After initial evaluation and assessments completed: Virtual Based Care may be provided alone or in conjunction with Phase 2 Cardiac Rehab based on patient barriers.: Yes   Call MD for:  redness, tenderness, or signs of infection (pain, swelling, redness, odor or green/yellow discharge around incision site)   Complete by: As directed    Diet - low sodium heart healthy   Complete by: As directed    Discharge instructions   Complete by: As directed    Radial Site Care Refer to this sheet in the next few weeks. These instructions provide you with information on caring for yourself after your procedure. Your caregiver may also give you more specific instructions. Your treatment has been planned according to current medical practices, but problems sometimes occur. Call your caregiver if you have any problems or questions after your procedure. HOME CARE INSTRUCTIONS You may shower the day after the procedure.Remove the bandage (dressing) and gently wash the site with plain soap and water.Gently pat the site dry.  Do  not apply powder or lotion to the site.  Do not submerge the affected site in water for 3 to 5 days.  Inspect the site at least twice daily.  Do not flex or bend the affected arm for 24 hours.  No lifting over 5 pounds (2.3 kg) for 5 days after your procedure.  Do not drive home if you are discharged the same day of the procedure. Have someone else drive you.  You may drive 24 hours after the procedure unless otherwise instructed by your caregiver.  What to expect: Any bruising  will usually fade within 1 to 2 weeks.  Blood that collects in the tissue (hematoma) may be painful to the touch. It should usually decrease in size and tenderness within 1 to 2 weeks.  SEEK IMMEDIATE MEDICAL CARE IF: You have unusual pain at the radial site.  You have redness, warmth, swelling, or pain at the radial site.  You have drainage (other than a small amount of blood on the dressing).  You have chills.  You have a fever or persistent symptoms for more than 72 hours.  You have a fever and your symptoms suddenly get worse.  Your arm becomes pale, cool, tingly, or numb.  You have heavy bleeding from the site. Hold pressure on the site.   PLEASE DO NOT MISS ANY DOSES OF YOUR PLAVIX!!!!! Also keep a log of you blood pressures and bring back to your follow up appt. Please call the office with any questions.   Patients taking blood thinners should generally stay away from medicines like ibuprofen, Advil, Motrin, naproxen, and Aleve due to risk of stomach bleeding. You may take Tylenol as directed or talk to your primary doctor about alternatives.   Increase activity slowly   Complete by: As directed        Discharge Medications     Medication List    STOP taking these medications   pioglitazone 30 MG tablet Commonly known as: ACTOS     TAKE these medications   aspirin EC 81 MG tablet Take 1 tablet (81 mg total) by mouth daily.   canagliflozin 300 MG Tabs tablet Commonly known as: Invokana Take  1 tablet (300 mg total) by mouth daily before breakfast.   clopidogrel 75 MG tablet Commonly known as: PLAVIX Take 1 tablet (75 mg total) by mouth daily with breakfast.   furosemide 40 MG tablet Commonly known as: LASIX Take 1 tablet (40 mg total) by mouth daily as needed. What changed: reasons to take this   glucose blood test strip Commonly known as: FREESTYLE LITE Use as instructed, patient tests once daily. Dx: 250.00   HYDROcodone-acetaminophen 5-325 MG tablet Commonly known as: NORCO/VICODIN TAKE 1 TABLET BY MOUTH 2 TIMES DAILY What changed:   how much to take  how to take this  when to take this  reasons to take this  additional instructions   lisinopril 20 MG tablet Commonly known as: ZESTRIL Take 1 tablet (20 mg total) by mouth daily.   metFORMIN 1000 MG tablet Commonly known as: GLUCOPHAGE Take 1 tablet (1,000 mg total) by mouth 2 (two) times daily with a meal.   nitroGLYCERIN 0.4 MG SL tablet Commonly known as: NITROSTAT Place 1 tablet (0.4 mg total) under the tongue every 5 (five) minutes as needed for chest pain.   potassium chloride SA 20 MEQ tablet Commonly known as: KLOR-CON Take 1 tablet (20 mEq total) by mouth 2 (two) times daily as needed (Take with lasix).   tamsulosin 0.4 MG Caps capsule Commonly known as: FLOMAX Take 1 capsule (0.4 mg total) by mouth daily.   triamcinolone cream 0.1 % Commonly known as: KENALOG Apply 1 application topically 2 (two) times daily as needed. What changed: reasons to take this        No                               Did the patient have a percutaneous coronary intervention (stent / angioplasty)?:  Yes.  Cath/PCI Registry Performance & Quality Measures: 4. Aspirin prescribed? - Yes 5. ADP Receptor Inhibitor (Plavix/Clopidogrel, Brilinta/Ticagrelor or Effient/Prasugrel) prescribed (includes medically managed patients)? - Yes 6. High Intensity Statin (Lipitor 40-80mg  or Crestor 20-40mg ) prescribed? -  No - intolerant 7. For EF <40%, was ACEI/ARB prescribed? - Yes 8. For EF <40%, Aldosterone Antagonist (Spironolactone or Eplerenone) prescribed? - Not Applicable (EF >/= [id number]) 9. Cardiac Rehab Phase II ordered (Included Medically managed Patients)? - Yes   Outstanding Labs/Studies   Lipid clinic referral  Duration of Discharge Encounter   Greater than 30 minutes including physician time.  Signed, Reino Bellis NP-C 10/02/2019, 11:38 AM   ATTENDING ATTESTATION  I have seen, examined and evaluated the patient this AM along with Reino Bellis, NP-C.  After reviewing all the available data and chart, we discussed the patients laboratory, study & physical findings as well as symptoms in detail. I agree with her findings, examination as well as impression recommendations as per our discussion.    Had been planned for staged PCI of LM & OM1 11/4, but presented early with Unstable Angina.  S/p DES PCI of LM (protected) & Ost OM1 for completion of revascularization.   1. Successful IVUS-guided PCI to the ostial through mid LMCA using Resolute Onyx 3.5 12 mm drug-eluting stent (postdilated to 4.2 mm) with 0% residual stenosis and TIMI-3 flow. 2. Successful PCI to OM1 using Resolute Onyx 2.25 x 26 mm drug-eluting stent with 0% residual stenosis and TIMI-3 flow  Doing well post PCI.  Cath site c/d/i.   For CAD - intolerant of Beta Blockers in the past - ? Would consider bisoprolol in OP setting.   Continue ACE (for CAD/ & DM) Intolerant of Statins & Zetia --> will arrange OP CVRR appt to discuss PCSK9-I.   ON Insulin & INvokana -- will d/c pioglitazone (to avoid potential CHF adverse effect).  Otherwise stable for d/c today -- ROV with Dr. Rockey Situ or APP in [place].    Glenetta Hew, M.D., M.S. Interventional Cardiologist   Pager # [phone] Phone # [phone] [address] [place], [place] [address]

## 2019-10-02 NOTE — Progress Notes (Signed)
Cardiovascular and Pulmonary Nurse Navigator Note:  This RN contacted patient by phone this a.m. to discuss Cardiac Rehab at [facility].  Patient had declined participation when he was here at [facility] a couple of weeks ago. Again, patient declined participation.  Will close referral(s).    Education has been provided this a.m. by Cardiac Rehab Exercise Physiologist Josephina Shih.    Roanna Epley, RN, BSN, [facility]  Cardiovascular & Pulmonary Nurse Navigator  Direct Line: [phone]  Department Phone #: [phone] Fax: [phone]  Email Address: [email or web address]

## 2019-10-02 NOTE — Progress Notes (Signed)
Discharged home accompanied by wife, Belongings taken home. 

## 2019-10-02 NOTE — Progress Notes (Signed)
All set  for discharge home discharge instructions given to pt. Awaiting ride home.

## 2019-10-04 ENCOUNTER — Telehealth: Payer: Self-pay

## 2019-10-04 NOTE — Telephone Encounter (Signed)
Per Dr Alla German note "Please check on him  Should consider TCM unless he is going right back to see cardiology again ."  Left message for patient to call back to follow up. Patient has an appointment with cardiology next week on 10/09/2019-Tuesday. No TCM needed but document how he is feeling any concerns for Dr Silvio Pate.

## 2019-10-07 NOTE — Progress Notes (Signed)
Cardiology Office Note    Date:  10/09/2019   ID:  [name] [name], DOB 12-Aug-1951, MRN [id number]  PCP:  Venia Carbon, MD  Cardiologist:  Ida Rogue, MD  Electrophysiologist:  None   Chief Complaint: Hospital follow up  History of Present Illness:   [name] [name] is a 68 y.o. male with history of CAD status post three-vessel CABG in 11/2011 with LIMA to LAD, sequential SVG to D1 and D3 status post subsequent PCI to the native RCA, left main, and OM1 as detailed below, diabetes, hypertension, hyperlipidemia with high intensity statin and Zetia intolerance, obesity, nephrolithiasis, carpal tunnel syndrome, chronic pain syndrome, BPH, arthritis, and OSA who presents for hospital follow-up as detailed below.  Pre-bypass carotid artery ultrasound showed no significant extracranial carotid artery stenosis with antegrade flow of the bilateral vertebral arteries.  Echo from 2013 showed an EF of 55 to 60%, mild concentric LVH, no regional wall motion abnormalities, normal LV diastolic function, mildly dilated left atrium, RV systolic function normal, PASP normal.    He was seen in clinic in 08/2019 and doing well from a cardiac perspective.  He did note some bilateral leg heaviness/fatigue that occurred with ambulation with follow-up ABIs in 09/2019 being normal bilaterally.  He was subsequently seen in 09/2019 noting development of chest discomfort and underwent diagnostic cath on 09/27/2019 which revealed moderate ostial left main disease with a known occlusion of the LAD, patent LIMA to the LAD, patent sequential vein graft to the D1 and D3, 90% stenosis in the OM1, and a 70% stenosis in the ostial/proximal RCA. EF 50-55% by LV gram.  Films were reviewed by interventional cardiology and iFRwas performed in the RCA and was abnormal at 0.88. Therefore, the ostial/proximal RCA was successfully treated with PCI/DES. It was felt that the patient would benefit from left main and OM1  intervention however, given contrast load and fluoroscopy time up to that point, it was felt that this would be better performed in a staged fashion which was scheduled for 10/10/2019.  However, prior to the patient being able to undergo staged PCI he presented to [facility] on 09/30/2019 with return of chest pain with initial high-sensitivity troponin of 21 with a delta of 22.  He underwent repeat cath on 10/01/2019 with successful IVUS guided PCI/DES to the ostial/mid left main along with successful PCI/DES x1 to the OM1.  Remaining cath details as outlined below.  He comes in doing reasonably well from a cardiac perspective he has noted an occasional mild chest pressure occurring randomly and lasting for only several seconds at a time that is improved when compared to symptoms leading up to his cath.  He does continue to note shortness of breath that is unchanged following his percutaneous revascularization.  He is somewhat frustrated by this persistent shortness of breath.  He had one episode of tachypalpitations that lasted for a couple seconds with spontaneous resolution without any associated symptoms.  He has been compliant with dual antiplatelet therapy and denies any falls, BRBPR, or melena.  No dizziness, presyncope, syncope, lower extremity swelling, abdominal distention, orthopnea, PND, or early satiety.  He remains out of work at this time given his job is quite strenuous and requires and lifting heavy coils.  There has been some mild bruising of the right radial cath site without any active bleeding or signs of infection.  He has tried taking an as needed Lasix a couple of times to see if this would help with  shortness of breath though he does not feel like there was any significant change.   Labs: 09/2019 - potassium 3.9, BUN 16, serum creatinine 0.88, Hgb 13.8, PLT 224 08/2019 - total cholesterol 217, triglycerides 223, HDL 45, direct LDL 132, albumin 4.5, AST/ALT normal, A1c 8.3, free  T4 normal   Past Medical History:  Diagnosis Date   Angina    Arthritis    BPH (benign prostatic hypertrophy)    Carpal tunnel syndrome    Coronary artery disease    a. 11/2011 s/p CABG x 3  (LIMA-LAD, SeqSVG-D1-D3); b. 09/2019 CATH-RCA PCI: LM 50ost, LAD 100ost/p, Sev diff diag dzs, LCX nl, OM1 32m, OM3 40, RCA 70ost (iFR 0.88--> 3.0x26 Resolute Onyx DES), 54m, LIMA->LAD ok. VG->D1->D3 ok. EF 50-55%.; c) 10/01/2019: staged PCI Ost LM (Resolute Onyx 3.5 x 12 - 4.2 mm) & Ost OM1 (Resolute Onyx 2.25 x 26 -- 2.5 mm)   GERD (gastroesophageal reflux disease)    Hx of colonic polyps    Hyperlipidemia    Hypertension    Kidney stones    Morbid obesity (HCC)    OSA (obstructive sleep apnea)    uses cpap   Type II diabetes mellitus ([place])     Past Surgical History:  Procedure Laterality Date   CORONARY ARTERY BYPASS GRAFT  11/12/2011   Procedure: CORONARY ARTERY BYPASS GRAFTING (CABG);  Surgeon: Grace Isaac, MD;  Location: [place];  Service: Open Heart Surgery;  Laterality: N/A;  Coronary Artery Bypass Graft times three on pump utilizing left internal mammary artery and right saphenous vein harvested endoscopically    CORONARY STENT INTERVENTION N/A 09/27/2019   Procedure: CORONARY STENT INTERVENTION;  Surgeon: Wellington Hampshire, MD;  Location: [place] [facility];  Service: Cardiovascular;  Laterality: N/A;   CORONARY STENT INTERVENTION N/A 10/01/2019   Procedure: CORONARY STENT INTERVENTION;  Surgeon: Nelva Bush, MD;  Location: [place] [facility];  Service: Cardiovascular;  Laterality: N/A;   HERNIA REPAIR     INTRAVASCULAR PRESSURE WIRE/FFR STUDY N/A 09/27/2019   Procedure: INTRAVASCULAR PRESSURE WIRE/FFR STUDY;  Surgeon: Wellington Hampshire, MD;  Location: [place] [facility];  Service: Cardiovascular;  Laterality: N/A;   INTRAVASCULAR ULTRASOUND/IVUS N/A 10/01/2019   Procedure: Intravascular Ultrasound/IVUS;  Surgeon: Nelva Bush, MD;  Location: [place] [facility];  Service: Cardiovascular;  Laterality: N/A;   KNEE ARTHROPLASTY Right 11/23/2017   Procedure: COMPUTER ASSISTED TOTAL KNEE ARTHROPLASTY;  Surgeon: Dereck Leep, MD;  Location: [facility];  Service: Orthopedics;  Laterality: Right;   KNEE SURGERY     left   LEFT HEART CATH AND CORONARY ANGIOGRAPHY Left 09/27/2019   Procedure: LEFT HEART CATH AND CORONARY ANGIOGRAPHY;  Surgeon: Minna Merritts, MD;  Location: [place] [facility];  Service: Cardiovascular;  Laterality: Left;   LEFT HEART CATH AND CORONARY ANGIOGRAPHY N/A 10/01/2019   Procedure: LEFT HEART CATH AND CORONARY ANGIOGRAPHY;  Surgeon: Nelva Bush, MD;  Location: [place] [facility];  Service: Cardiovascular;  Laterality: N/A;   MENISCUS REPAIR  03/08   right knee    Current Medications: Current Meds  Medication Sig   aspirin EC 81 MG tablet Take 1 tablet (81 mg total) by mouth daily.   canagliflozin (INVOKANA) 300 MG TABS tablet Take 1 tablet (300 mg total) by mouth daily before breakfast.   clopidogrel (PLAVIX) 75 MG tablet Take 1 tablet (75 mg total) by mouth daily with breakfast.   furosemide (LASIX) 40 MG tablet Take 1 tablet (40 mg total)  by mouth daily as needed. (Patient taking differently: Take 40 mg by mouth daily as needed for fluid. )   glucose blood (FREESTYLE LITE) test strip Use as instructed, patient tests once daily. Dx: 250.00   HYDROcodone-acetaminophen (NORCO/VICODIN) 5-325 MG tablet TAKE 1 TABLET BY MOUTH 2 TIMES DAILY (Patient taking differently: Take 1 tablet by mouth 2 (two) times daily as needed (pain). )   lisinopril (ZESTRIL) 20 MG tablet Take 1 tablet (20 mg total) by mouth daily.   metFORMIN (GLUCOPHAGE) 1000 MG tablet Take 1 tablet (1,000 mg total) by mouth 2 (two) times daily with a meal.   nitroGLYCERIN (NITROSTAT) 0.4 MG SL tablet Place 1 tablet (0.4 mg total) under the tongue every 5 (five) minutes as needed for chest pain.   potassium chloride SA  (K-DUR,KLOR-CON) 20 MEQ tablet Take 1 tablet (20 mEq total) by mouth 2 (two) times daily as needed (Take with lasix).   tamsulosin (FLOMAX) 0.4 MG CAPS capsule Take 1 capsule (0.4 mg total) by mouth daily.   triamcinolone cream (KENALOG) 0.1 % Apply 1 application topically 2 (two) times daily as needed. (Patient taking differently: Apply 1 application topically 2 (two) times daily as needed (irritation). )    Allergies:   Doxazosin mesylate, Tetracyclines & related, Carvedilol, Crestor [rosuvastatin], Doxycycline, Glimepiride, Glipizide, Losartan potassium-hctz, Metoprolol tartrate, Pravastatin, Rosiglitazone maleate, and Simvastatin   Social History   Socioeconomic History   Marital status: Married    Spouse name: Not on file   Number of children: 3   Years of education: Not on file   Highest education level: Not on file  Occupational History   Occupation: Surveyor, quantity  Social Needs   Financial resource strain: Not on file   Food insecurity    Worry: Not on file    Inability: Not on file   Transportation needs    Medical: Not on file    Non-medical: Not on file  Tobacco Use   Smoking status: Never Smoker   Smokeless tobacco: Never Used  Substance and Sexual Activity   Alcohol use: No   Drug use: No   Sexual activity: Yes  Lifestyle   Physical activity    Days per week: Not on file    Minutes per session: Not on file   Stress: Not on file  Relationships   Social connections    Talks on phone: Not on file    Gets together: Not on file    Attends religious service: Not on file    Active member of club or organization: Not on file    Attends meetings of clubs or organizations: Not on file    Relationship status: Not on file  Other Topics Concern   Not on file  Social History Narrative   No living will   Requests wife as health care POA   Would accept resuscitation   Would accept tube feedings     Family History:  The patient's family history  includes Coronary artery disease in his father; Prostate cancer in his father. There is no history of Diabetes or Hypertension.  ROS:   Review of Systems  Constitutional: Positive for malaise/fatigue. Negative for chills, diaphoresis, fever and weight loss.  HENT: Negative for congestion.   Eyes: Negative for discharge and redness.  Respiratory: Positive for shortness of breath. Negative for cough, hemoptysis, sputum production and wheezing.   Cardiovascular: Positive for chest pain. Negative for palpitations, orthopnea, claudication, leg swelling and PND.       Improved  chest pressure  Gastrointestinal: Negative for abdominal pain, blood in stool, heartburn, melena, nausea and vomiting.  Genitourinary: Negative for hematuria.  Musculoskeletal: Negative for falls and myalgias.  Skin: Negative for rash.  Neurological: Negative for dizziness, tingling, tremors, sensory change, speech change, focal weakness, loss of consciousness and weakness.  Endo/Heme/Allergies: Does not bruise/bleed easily.  Psychiatric/Behavioral: Negative for substance abuse. The patient is not nervous/anxious.   All other systems reviewed and are negative.    EKGs/Labs/Other Studies Reviewed:    Studies reviewed were summarized above. The additional studies were reviewed today:  [place] 10/01/2019: Conclusions: 1. Three-vessel coronary artery disease, including 60% ostial/proximal LMCA stenosis (MLA 5.4 mm^2 by IVUS), chronic total occlusion of the proximal LAD (known patent LIMA-LAD and SVG-D1 and D2), 90% OM1 stenosis, and 50% mid/distal RCA stenosis. 2. Widely patent proximal RCA stent. 3. Normal left ventricular filling pressure. 4. Successful IVUS-guided PCI to the ostial through mid LMCA using Resolute Onyx 3.5 12 mm drug-eluting stent (postdilated to 4.2 mm) with 0% residual stenosis and TIMI-3 flow. 5. Successful PCI to OM1 using Resolute Onyx 2.25 x 26 mm drug-eluting stent with 0% residual stenosis and TIMI-3  flow.  Recommendations: 1. Indefinite dual antiplatelet therapy with aspirin and clopidogrel. 2. Aggressive secondary prevention. 3. Anticipate discharge home tomorrow if no post-catheterization complications.   EKG:  EKG is ordered today.  The EKG ordered today demonstrates NSR, 78 bpm, normal axis, nonspecific IVCD, no acute ST-T changes, grossly unchanged from prior  Recent Labs: 09/05/2019: ALT 15 10/02/2019: BUN 16; Creatinine, Ser 0.88; Hemoglobin 13.8; Platelets 224; Potassium 3.9; Sodium 136  Recent Lipid Panel    Component Value Date/Time   CHOL 217 (H) 09/05/2019 1138   TRIG 223.0 (H) 09/05/2019 1138   HDL 45.30 09/05/2019 1138   CHOLHDL 5 09/05/2019 1138   VLDL 44.6 (H) 09/05/2019 1138   LDLCALC 130 (H) 03/20/2018 1146   LDLDIRECT 132.0 09/05/2019 1138    PHYSICAL EXAM:    VS:  BP 120/60 (BP Location: Left Arm, Patient Position: Sitting, Cuff Size: Normal)    Pulse 78    Ht 5\' 4"  (1.626 m)    Wt 211 lb (95.7 kg)    SpO2 98%    BMI 36.22 kg/m   BMI: Body mass index is 36.22 kg/m.  Physical Exam  Constitutional: He is oriented to person, place, and time. He appears well-developed and well-nourished.  HENT:  Head: Normocephalic and atraumatic.  Eyes: Right eye exhibits no discharge. Left eye exhibits no discharge.  Neck: Normal range of motion. No JVD present.  Cardiovascular: Normal rate, regular rhythm, S1 normal, S2 normal and normal heart sounds. Exam reveals no distant heart sounds, no friction rub, no midsystolic click and no opening snap.  No murmur heard. Pulses:      Posterior tibial pulses are 2+ on the right side and 2+ on the left side.  Right radial cath site with 2+ radial pulse with mild ecchymosis without active bleeding, swelling, warmth, erythema, or tenderness to palpation.  Pulmonary/Chest: Effort normal and breath sounds normal. No respiratory distress. He has no decreased breath sounds. He has no wheezes. He has no rales. He exhibits no  tenderness.  Abdominal: Soft. He exhibits no distension. There is no abdominal tenderness.  Musculoskeletal:        General: No edema.  Neurological: He is alert and oriented to person, place, and time.  Skin: Skin is warm and dry. No cyanosis. Nails show no clubbing.  Psychiatric: He has a  normal mood and affect. His speech is normal and behavior is normal. Judgment and thought content normal.    Wt Readings from Last 3 Encounters:  10/09/19 211 lb (95.7 kg)  10/02/19 208 lb 12.4 oz (94.7 kg)  09/28/19 210 lb 14.4 oz (95.7 kg)     ASSESSMENT & PLAN:   1. CAD involving the native coronary arteries and bypass grafts status post recent PCI as above with persistent dyspnea: Overall, he notes improvement in chest pressure following PCI though does continue to note unchanged dyspnea.  Continue dual antiplatelet therapy with aspirin and Plavix indefinitely.  He is intolerant to beta-blockers and Imdur.  Obtain echo given persistent dyspnea.  Less likely PE given heart rate of 78 bpm and pulse ox of 98% on room air.  Patient declines PE work-up at this time.  Could consider addition of amlodipine or ranolazine for further antianginal effect moving forward.  Given the intensity of his job he will remain out of work at this time until the right radial wrist is further healed and following work-up of his persistent dyspnea.  2. HTN: Blood pressure is well controlled.  Remains on lisinopril.  Intolerant to beta-blockers.  3. HLD: LDL 132 from 08/2019 with goal LDL being less than 70.  Patient intolerant to high intensity statin and Zetia.  He has an appointment with the lipid clinic in [place] next month to discuss potential PCSK9 inhibitor versus bempedoic acid.   4. DM2: Remains on Invokana.  No longer on Actos.  Follow-up with PCP as directed.  5. OSA: Continue CPAP.  Disposition: F/u with Dr. Rockey Situ or an APP in 1 month.   Medication Adjustments/Labs and Tests Ordered: Current medicines are  reviewed at length with the patient today.  Concerns regarding medicines are outlined above. Medication changes, Labs and Tests ordered today are summarized above and listed in the Patient Instructions accessible in Encounters.   Signed, Christell Faith, PA-C 10/09/2019 11:20 AM     [place] [address] [place] [address] [place], [place] [address] [phone]

## 2019-10-09 ENCOUNTER — Ambulatory Visit (INDEPENDENT_AMBULATORY_CARE_PROVIDER_SITE_OTHER): Payer: BC Managed Care – PPO | Admitting: Physician Assistant

## 2019-10-09 ENCOUNTER — Other Ambulatory Visit: Payer: Self-pay

## 2019-10-09 ENCOUNTER — Encounter: Payer: Self-pay | Admitting: Physician Assistant

## 2019-10-09 ENCOUNTER — Ambulatory Visit: Payer: BC Managed Care – PPO | Admitting: Physician Assistant

## 2019-10-09 VITALS — BP 120/60 | HR 78 | Ht 64.0 in | Wt 211.0 lb

## 2019-10-09 DIAGNOSIS — Z9861 Coronary angioplasty status: Secondary | ICD-10-CM

## 2019-10-09 DIAGNOSIS — I25119 Atherosclerotic heart disease of native coronary artery with unspecified angina pectoris: Secondary | ICD-10-CM

## 2019-10-09 DIAGNOSIS — E1165 Type 2 diabetes mellitus with hyperglycemia: Secondary | ICD-10-CM

## 2019-10-09 DIAGNOSIS — I25118 Atherosclerotic heart disease of native coronary artery with other forms of angina pectoris: Secondary | ICD-10-CM

## 2019-10-09 DIAGNOSIS — R0600 Dyspnea, unspecified: Secondary | ICD-10-CM | POA: Diagnosis not present

## 2019-10-09 DIAGNOSIS — IMO0002 Reserved for concepts with insufficient information to code with codable children: Secondary | ICD-10-CM

## 2019-10-09 DIAGNOSIS — E118 Type 2 diabetes mellitus with unspecified complications: Secondary | ICD-10-CM

## 2019-10-09 DIAGNOSIS — G4733 Obstructive sleep apnea (adult) (pediatric): Secondary | ICD-10-CM

## 2019-10-09 DIAGNOSIS — Z951 Presence of aortocoronary bypass graft: Secondary | ICD-10-CM

## 2019-10-09 DIAGNOSIS — E785 Hyperlipidemia, unspecified: Secondary | ICD-10-CM | POA: Diagnosis not present

## 2019-10-09 DIAGNOSIS — Z9989 Dependence on other enabling machines and devices: Secondary | ICD-10-CM

## 2019-10-09 DIAGNOSIS — I10 Essential (primary) hypertension: Secondary | ICD-10-CM | POA: Diagnosis not present

## 2019-10-09 DIAGNOSIS — Z789 Other specified health status: Secondary | ICD-10-CM

## 2019-10-09 DIAGNOSIS — I2510 Atherosclerotic heart disease of native coronary artery without angina pectoris: Secondary | ICD-10-CM

## 2019-10-09 MED ORDER — NITROGLYCERIN 0.4 MG SL SUBL: 0.4000 mg | SUBLINGUAL_TABLET | SUBLINGUAL | 3 refills | Status: DC | PRN

## 2019-10-09 MED ORDER — CLOPIDOGREL BISULFATE 75 MG PO TABS: 75.0000 mg | ORAL_TABLET | Freq: Every day | ORAL | 2 refills | Status: DC

## 2019-10-09 NOTE — Patient Instructions (Signed)
Medication Instructions:  Your physician recommends that you continue on your current medications as directed. Please refer to the Current Medication list given to you today.  *If you need a refill on your cardiac medications before your next appointment, please call your pharmacy*  Lab Work: Your physician recommends that you have lab work today(CBC, BMET)  If you have labs (blood work) drawn today and your tests are completely normal, you will receive your results only by: Marland Kitchen MyChart Message (if you have MyChart) OR . A paper copy in the mail If you have any lab test that is abnormal or we need to change your treatment, we will call you to review the results.  Testing/Procedures: 1- Echo  Please return to [facility] on ______________ at _______________ AM/PM for an Echocardiogram. Your physician has requested that you have an echocardiogram. Echocardiography is a painless test that uses sound waves to create images of your heart. It provides your doctor with information about the size and shape of your heart and how well your heart's chambers and valves are working. This procedure takes approximately one hour. There are no restrictions for this procedure. Please note; depending on visual quality an IV may need to be placed.    Follow-Up: At [facility], you and your health needs are our priority.  As part of our continuing mission to provide you with exceptional heart care, we have created designated Provider Care Teams.  These Care Teams include your primary Cardiologist (physician) and Advanced Practice Providers (APPs -  Physician Assistants and Nurse Practitioners) who all work together to provide you with the care you need, when you need it.  Your next appointment:   1 month  The format for your next appointment:   In Person  Provider:    You may see Ida Rogue, MD or Christell Faith, PA-C.

## 2019-10-10 ENCOUNTER — Encounter (HOSPITAL_COMMUNITY): Payer: Self-pay

## 2019-10-10 ENCOUNTER — Telehealth: Payer: Self-pay

## 2019-10-10 ENCOUNTER — Ambulatory Visit (HOSPITAL_COMMUNITY): Admit: 2019-10-10 | Payer: BC Managed Care – PPO | Admitting: Cardiovascular Disease

## 2019-10-10 LAB — BASIC METABOLIC PANEL
BUN/Creatinine Ratio: 17 (ref 10–24)
BUN: 18 mg/dL (ref 8–27)
CO2: 19 mmol/L — ABNORMAL LOW (ref 20–29)
Calcium: 9.5 mg/dL (ref 8.6–10.2)
Chloride: 101 mmol/L (ref 96–106)
Creatinine, Ser: 1.05 mg/dL (ref 0.76–1.27)
GFR calc Af Amer: 84 mL/min/{1.73_m2} (ref >59)
GFR calc non Af Amer: 73 mL/min/{1.73_m2} (ref >59)
Glucose: 178 mg/dL — ABNORMAL HIGH (ref 65–99)
Potassium: 4.5 mmol/L (ref 3.5–5.2)
Sodium: 137 mmol/L (ref 134–144)

## 2019-10-10 LAB — CBC
Hematocrit: 46.4 % (ref 37.5–51.0)
Hemoglobin: 15.5 g/dL (ref 13.0–17.7)
MCH: 29.5 pg (ref 26.6–33.0)
MCHC: 33.4 g/dL (ref 31.5–35.7)
MCV: 88 fL (ref 79–97)
Platelets: 260 10*3/uL (ref 150–450)
RBC: 5.25 x10E6/uL (ref 4.14–5.80)
RDW: 13.8 % (ref 11.6–15.4)
WBC: 6.3 10*3/uL (ref 3.4–10.8)

## 2019-10-10 SURGERY — CORONARY STENT INTERVENTION
Anesthesia: LOCAL

## 2019-10-10 NOTE — Telephone Encounter (Signed)
Call to patient to discuss results of lab work.   No questions or any further orders at this time.   Advised pt to call for any further questions or concerns.

## 2019-10-10 NOTE — Telephone Encounter (Signed)
-----   Message from Rise Mu, PA-C sent at 10/10/2019  9:40 AM EST ----- Blood count is normal.  Random glucose is elevated with known DM - follow up with PCP as directed. Renal function is normal.  Potassium is at goal.

## 2019-10-11 ENCOUNTER — Other Ambulatory Visit: Payer: Self-pay | Admitting: Internal Medicine

## 2019-10-11 NOTE — Telephone Encounter (Signed)
Name of Medication: Hydrocodone Name of Pharmacy: [facility] or Written Date and Quantity: 09-03-19 #60 Last Office Visit and Type: 09-05-19 Next Office Visit and Type: 03-04-20 Last Controlled Substance Agreement Date: 09-05-19 Last UDS: 09-05-19

## 2019-10-11 NOTE — Telephone Encounter (Signed)
Left message on VM to see how he is doing.

## 2019-10-16 NOTE — Telephone Encounter (Signed)
Received forms for md review.  Placed in nurse box.

## 2019-10-18 ENCOUNTER — Telehealth: Payer: Self-pay | Admitting: Cardiovascular Disease

## 2019-10-18 ENCOUNTER — Ambulatory Visit (INDEPENDENT_AMBULATORY_CARE_PROVIDER_SITE_OTHER): Payer: BC Managed Care – PPO

## 2019-10-18 ENCOUNTER — Other Ambulatory Visit: Payer: Self-pay

## 2019-10-18 DIAGNOSIS — R0600 Dyspnea, unspecified: Secondary | ICD-10-CM

## 2019-10-18 MED ORDER — PERFLUTREN LIPID MICROSPHERE
1.0000 mL | INTRAVENOUS | Status: AC | PRN
  Administered 2019-10-18: 2 mL via INTRAVENOUS

## 2019-10-18 NOTE — Telephone Encounter (Signed)
Recieved request from : [facility]   Scanned to roi # [id number]  Forwarded to [employer] for processing

## 2019-10-24 NOTE — Telephone Encounter (Signed)
Received forms from [facility] for physician to sign, placed in nurses box.

## 2019-10-24 NOTE — Telephone Encounter (Signed)
Forms have been completed and placed up front on [name] [name]'s desk for processing.  

## 2019-11-06 NOTE — Telephone Encounter (Signed)
Received forms from [employer] for physician to complete, placed in nurses box 

## 2019-11-07 ENCOUNTER — Ambulatory Visit (INDEPENDENT_AMBULATORY_CARE_PROVIDER_SITE_OTHER): Payer: BC Managed Care – PPO | Admitting: Internal Medicine

## 2019-11-07 ENCOUNTER — Encounter: Payer: Self-pay | Admitting: Internal Medicine

## 2019-11-07 ENCOUNTER — Other Ambulatory Visit: Payer: Self-pay

## 2019-11-07 DIAGNOSIS — M7552 Bursitis of left shoulder: Secondary | ICD-10-CM | POA: Insufficient documentation

## 2019-11-07 NOTE — Progress Notes (Signed)
Subjective:    Patient ID: [name] [name], male    DOB: 08/31/1951, 67 y.o.   MRN: [id number]  HPI Here due to worsened left shoulder pain Increasing pain at night---sleeping in chair since heart problems and can't turn to help the pain Aching really bad---especially last night---prompting the visit  Current Outpatient Medications on File Prior to Visit  Medication Sig Dispense Refill  . aspirin EC 81 MG tablet Take 1 tablet (81 mg total) by mouth daily. 90 tablet 3  . canagliflozin (INVOKANA) 300 MG TABS tablet Take 1 tablet (300 mg total) by mouth daily before breakfast. 90 tablet 3  . clopidogrel (PLAVIX) 75 MG tablet Take 1 tablet (75 mg total) by mouth daily with breakfast. 90 tablet 2  . furosemide (LASIX) 40 MG tablet Take 1 tablet (40 mg total) by mouth daily as needed. (Patient taking differently: Take 40 mg by mouth daily as needed for fluid. ) 90 tablet 3  . glucose blood (FREESTYLE LITE) test strip Use as instructed, patient tests once daily. Dx: 250.00 300 each 3  . HYDROcodone-acetaminophen (NORCO/VICODIN) 5-325 MG tablet Take 1 tablet by mouth 2 (two) times daily as needed (pain). 60 tablet 0  . lisinopril (ZESTRIL) 20 MG tablet Take 1 tablet (20 mg total) by mouth daily. 90 tablet 3  . metFORMIN (GLUCOPHAGE) 1000 MG tablet Take 1 tablet (1,000 mg total) by mouth 2 (two) times daily with a meal. 180 tablet 3  . nitroGLYCERIN (NITROSTAT) 0.4 MG SL tablet Place 1 tablet (0.4 mg total) under the tongue every 5 (five) minutes as needed for chest pain. 25 tablet 3  . potassium chloride SA (K-DUR,KLOR-CON) 20 MEQ tablet Take 1 tablet (20 mEq total) by mouth 2 (two) times daily as needed (Take with lasix). 90 tablet 3  . tamsulosin (FLOMAX) 0.4 MG CAPS capsule Take 1 capsule (0.4 mg total) by mouth daily. 90 capsule 3  . triamcinolone cream (KENALOG) 0.1 % Apply 1 application topically 2 (two) times daily as needed. (Patient taking differently: Apply 1 application topically 2  (two) times daily as needed (irritation). ) 30 g 0   No current facility-administered medications on file prior to visit.     Allergies  Allergen Reactions  . Doxazosin Mesylate Other (See Comments)    REACTION: didn't work and may have caused SOB  . Tetracyclines & Related   . Carvedilol Other (See Comments)    Felt bad  . Crestor [Rosuvastatin] Other (See Comments)    Muscle aches.  . Doxycycline Other (See Comments)    REACTION: unspecified  . Glimepiride Other (See Comments)    REACTION: red eyes  . Glipizide Other (See Comments)    REACTION: myalgia??  . Losartan Potassium-Hctz Other (See Comments)    REACTION: chest \\T \ leg pain  . Metoprolol Tartrate Other (See Comments)    Mood swings, memory changes  . Pravastatin Other (See Comments)    Muscle aches   . Rosiglitazone Maleate Other (See Comments)    REACTION: myalgia  . Simvastatin Other (See Comments)    REACTION: myalgias    Past Medical History:  Diagnosis Date  . Angina   . Arthritis   . BPH (benign prostatic hypertrophy)   . Carpal tunnel syndrome   . Coronary artery disease    a. 11/2011 s/p CABG x 3  (LIMA-LAD, SeqSVG-D1-D3); b. 09/2019 CATH-RCA PCI: LM 50ost, LAD 100ost/p, Sev diff diag dzs, LCX nl, OM1 62m, OM3 40, RCA 70ost (iFR 0.88--> 3.0x26 Resolute  Onyx DES), 39m, LIMA->LAD ok. VG->D1->D3 ok. EF 50-55%.; c) 10/01/2019: staged PCI Ost LM (Resolute Onyx 3.5 x 12 - 4.2 mm) & Ost OM1 (Resolute Onyx 2.25 x 26 -- 2.5 mm)  . GERD (gastroesophageal reflux disease)   . Hx of colonic polyps   . Hyperlipidemia   . Hypertension   . Kidney stones   . Morbid obesity ([place])   . OSA (obstructive sleep apnea)    uses cpap  . Type II diabetes mellitus ([place])     Past Surgical History:  Procedure Laterality Date  . CORONARY ARTERY BYPASS GRAFT  11/12/2011   Procedure: CORONARY ARTERY BYPASS GRAFTING (CABG);  Surgeon: Grace Isaac, MD;  Location: [facility];  Service: Open Heart Surgery;  Laterality: N/A;   Coronary Artery Bypass Graft times three on pump utilizing left internal mammary artery and right saphenous vein harvested endoscopically   . CORONARY STENT INTERVENTION N/A 09/27/2019   Procedure: CORONARY STENT INTERVENTION;  Surgeon: Wellington Hampshire, MD;  Location: [facility];  Service: Cardiovascular;  Laterality: N/A;  . CORONARY STENT INTERVENTION N/A 10/01/2019   Procedure: CORONARY STENT INTERVENTION;  Surgeon: Nelva Bush, MD;  Location: [facility];  Service: Cardiovascular;  Laterality: N/A;  . HERNIA REPAIR    . INTRAVASCULAR PRESSURE WIRE/FFR STUDY N/A 09/27/2019   Procedure: INTRAVASCULAR PRESSURE WIRE/FFR STUDY;  Surgeon: Wellington Hampshire, MD;  Location: [facility];  Service: Cardiovascular;  Laterality: N/A;  . INTRAVASCULAR ULTRASOUND/IVUS N/A 10/01/2019   Procedure: Intravascular Ultrasound/IVUS;  Surgeon: Nelva Bush, MD;  Location: [facility];  Service: Cardiovascular;  Laterality: N/A;  . KNEE ARTHROPLASTY Right 11/23/2017   Procedure: COMPUTER ASSISTED TOTAL KNEE ARTHROPLASTY;  Surgeon: Dereck Leep, MD;  Location: [facility];  Service: Orthopedics;  Laterality: Right;  . KNEE SURGERY     left  . LEFT HEART CATH AND CORONARY ANGIOGRAPHY Left 09/27/2019   Procedure: LEFT HEART CATH AND CORONARY ANGIOGRAPHY;  Surgeon: Minna Merritts, MD;  Location: [place] [facility];  Service: Cardiovascular;  Laterality: Left;  . LEFT HEART CATH AND CORONARY ANGIOGRAPHY N/A 10/01/2019   Procedure: LEFT HEART CATH AND CORONARY ANGIOGRAPHY;  Surgeon: Nelva Bush, MD;  Location: [facility];  Service: Cardiovascular;  Laterality: N/A;  . MENISCUS REPAIR  03/08   right knee    Family History  Problem Relation Age of Onset  . Coronary artery disease Father   . Prostate cancer Father   . Diabetes Neg Hx   . Hypertension Neg Hx     Social History   Socioeconomic History  . Marital status: Married    Spouse name: Not on  file  . Number of children: 3  . Years of education: Not on file  . Highest education level: Not on file  Occupational History  . Occupation: Surveyor, quantity  Social Needs  . Financial resource strain: Not on file  . Food insecurity    Worry: Not on file    Inability: Not on file  . Transportation needs    Medical: Not on file    Non-medical: Not on file  Tobacco Use  . Smoking status: Never Smoker  . Smokeless tobacco: Never Used  Substance and Sexual Activity  . Alcohol use: No  . Drug use: No  . Sexual activity: Yes  Lifestyle  . Physical activity    Days per week: Not on file    Minutes per session: Not on file  . Stress:  Not on file  Relationships  . Social Herbalist on phone: Not on file    Gets together: Not on file    Attends religious service: Not on file    Active member of club or organization: Not on file    Attends meetings of clubs or organizations: Not on file    Relationship status: Not on file  . Intimate partner violence    Fear of current or ex partner: Not on file    Emotionally abused: Not on file    Physically abused: Not on file    Forced sexual activity: Not on file  Other Topics Concern  . Not on file  Social History Narrative   No living will   Requests wife as health care POA   Would accept resuscitation   Would accept tube feedings      Review of Systems     Objective:   Physical Exam  Musculoskeletal:     Comments: Tenderness over subacromial bursa on left Decreased abduction of shoulder but fair rotation           Assessment & Plan:

## 2019-11-07 NOTE — Assessment & Plan Note (Signed)
Seems to be the predominant problem Discussed options and pros/cons of injection Verbal consent  Sterile prep over left subacromial space Ethyl chloride the 1cc 1% plain lidocaine 40mg  depomedrol/4 cc 1% lidocaine injected without difficulty No apparent reduction in pain Discussed home care  If ongoing symptoms, would consider injection into the joint---or perhaps just proceeding with ortho evaluation

## 2019-11-08 ENCOUNTER — Ambulatory Visit (INDEPENDENT_AMBULATORY_CARE_PROVIDER_SITE_OTHER): Payer: BC Managed Care – PPO | Admitting: Pharmacist

## 2019-11-08 VITALS — BP 110/60 | HR 73 | Resp 16 | Ht 64.0 in | Wt 210.0 lb

## 2019-11-08 DIAGNOSIS — E782 Mixed hyperlipidemia: Secondary | ICD-10-CM

## 2019-11-08 DIAGNOSIS — I2510 Atherosclerotic heart disease of native coronary artery without angina pectoris: Secondary | ICD-10-CM

## 2019-11-08 DIAGNOSIS — Z789 Other specified health status: Secondary | ICD-10-CM | POA: Diagnosis not present

## 2019-11-08 DIAGNOSIS — Z9861 Coronary angioplasty status: Secondary | ICD-10-CM

## 2019-11-08 MED ORDER — BEMPEDOIC ACID 180 MG PO TABS: 180.0000 mg | ORAL_TABLET | Freq: Every day | ORAL | 0 refills | Status: DC

## 2019-11-08 NOTE — Progress Notes (Signed)
Patient ID: ALFIO ALLSUP                 DOB: Feb 19, 1951                    MRN: [id number]     HPI: [name] [name] is a 68 y.o. male patient of DR Rockey Situ referred to lipid clinic by Christell Faith PA-C. PMH is significant for CABG x 3  in 11/2011, diabetes, hypertension, hyperlipidemia, nephrolithiasis, chronic pain syndrome, and OSA. Noted intolerance to at least 3 different statins and ezetimibe. Patient presents for further therapy adjustment and potential initiation of PCSK9i or bempedoic acid.  Current Medications: none  Intolerances:  Atorvastatin 20mg  daily - muscle pain and joint Rosuvastatin 40mg  - muscle pain Rosuvastatin 20mg  - muscle pain Rosuvastatin 5mg  - muscle pain Pravastatin 20mg  - muscle and joint pain - unable to get out of chair after few days Ezetimibe 10mg  - unable to remember  LDL goal: <70,md/dL or 50% decrease to from baseline  Diet: fair, a little bit of everything  Exercise: [place], works around 6 hours every day  Family History: family history includes Coronary artery disease in his father; Prostate cancer in his father. There is no history of Diabetes or Hypertension  Social History: denies tobacco use, denies alcohol use  Labs: 09/05/2019: CHO 217, TG 223, HDL 45, LDL direct 132 (no therapy)  Past Medical History:  Diagnosis Date  . Angina   . Arthritis   . BPH (benign prostatic hypertrophy)   . Carpal tunnel syndrome   . Coronary artery disease    a. 11/2011 s/p CABG x 3  (LIMA-LAD, SeqSVG-D1-D3); b. 09/2019 CATH-RCA PCI: LM 50ost, LAD 100ost/p, Sev diff diag dzs, LCX nl, OM1 20m, OM3 40, RCA 70ost (iFR 0.88--> 3.0x26 Resolute Onyx DES), 91m, LIMA->LAD ok. VG->D1->D3 ok. EF 50-55%.; c) 10/01/2019: staged PCI Ost LM (Resolute Onyx 3.5 x 12 - 4.2 mm) & Ost OM1 (Resolute Onyx 2.25 x 26 -- 2.5 mm)  . GERD (gastroesophageal reflux disease)   . Hx of colonic polyps   . Hyperlipidemia   . Hypertension   . Kidney stones   . Morbid  obesity ([place])   . OSA (obstructive sleep apnea)    uses cpap  . Type II diabetes mellitus ([place])     Current Outpatient Medications on File Prior to Visit  Medication Sig Dispense Refill  . aspirin EC 81 MG tablet Take 1 tablet (81 mg total) by mouth daily. 90 tablet 3  . canagliflozin (INVOKANA) 300 MG TABS tablet Take 1 tablet (300 mg total) by mouth daily before breakfast. 90 tablet 3  . clopidogrel (PLAVIX) 75 MG tablet Take 1 tablet (75 mg total) by mouth daily with breakfast. 90 tablet 2  . furosemide (LASIX) 40 MG tablet Take 1 tablet (40 mg total) by mouth daily as needed. (Patient taking differently: Take 40 mg by mouth daily as needed for fluid. ) 90 tablet 3  . glucose blood (FREESTYLE LITE) test strip Use as instructed, patient tests once daily. Dx: 250.00 300 each 3  . HYDROcodone-acetaminophen (NORCO/VICODIN) 5-325 MG tablet Take 1 tablet by mouth 2 (two) times daily as needed (pain). 60 tablet 0  . lisinopril (ZESTRIL) 20 MG tablet Take 1 tablet (20 mg total) by mouth daily. 90 tablet 3  . metFORMIN (GLUCOPHAGE) 1000 MG tablet Take 1 tablet (1,000 mg total) by mouth 2 (two) times daily with a meal. 180 tablet 3  .  nitroGLYCERIN (NITROSTAT) 0.4 MG SL tablet Place 1 tablet (0.4 mg total) under the tongue every 5 (five) minutes as needed for chest pain. 25 tablet 3  . potassium chloride SA (K-DUR,KLOR-CON) 20 MEQ tablet Take 1 tablet (20 mEq total) by mouth 2 (two) times daily as needed (Take with lasix). 90 tablet 3  . tamsulosin (FLOMAX) 0.4 MG CAPS capsule Take 1 capsule (0.4 mg total) by mouth daily. 90 capsule 3  . triamcinolone cream (KENALOG) 0.1 % Apply 1 application topically 2 (two) times daily as needed. (Patient taking differently: Apply 1 application topically 2 (two) times daily as needed (irritation). ) 30 g 0   No current facility-administered medications on file prior to visit.     Allergies  Allergen Reactions  . Doxazosin Mesylate Other (See Comments)     REACTION: didn't work and may have caused SOB  . Tetracyclines & Related   . Carvedilol Other (See Comments)    Felt bad  . Crestor [Rosuvastatin] Other (See Comments)    Muscle aches.  . Doxycycline Other (See Comments)    REACTION: unspecified  . Glimepiride Other (See Comments)    REACTION: red eyes  . Glipizide Other (See Comments)    REACTION: myalgia??  . Losartan Potassium-Hctz Other (See Comments)    REACTION: chest \\T \ leg pain  . Metoprolol Tartrate Other (See Comments)    Mood swings, memory changes  . Pravastatin Other (See Comments)    Muscle aches   . Rosiglitazone Maleate Other (See Comments)    REACTION: myalgia  . Simvastatin Other (See Comments)    REACTION: myalgias    Hyperlipidemia LDL remains above goal for secondary prevention. Patient is intolerant to multiple statins including atorvastatin, rosuvastatin,and pravastatin. Also failed trial with ezetimibe 10mg  daily. We discussed indication, administration PA process, and common side effects for Nexletol (bempedoic acid), Repatha and Praluent. Patient elected trial with Nexletol first. 14 day sample provided today during office visit. Patient is to call back if problems noted or unable to tolerate medication after 1 week. Plan to repeat fasting lipid panel, liver enzymes and CMET 4 weeks after initiating Nexletol therapy.    Omario Ander Rodriguez-Guzman PharmD, BCPS, [place] [address] 11/08/2019 4:51 PM

## 2019-11-08 NOTE — Assessment & Plan Note (Signed)
LDL remains above goal for secondary prevention. Patient is intolerant to multiple statins including atorvastatin, rosuvastatin,and pravastatin. Also failed trial with ezetimibe 10mg  daily. We discussed indication, administration PA process, and common side effects for Nexletol (bempedoic acid), Repatha and Praluent. Patient elected trial with Nexletol first. 14 day sample provided today during office visit. Patient is to call back if problems noted or unable to tolerate medication after 1 week. Plan to repeat fasting lipid panel, liver enzymes and CMET 4 weeks after initiating Nexletol therapy.

## 2019-11-08 NOTE — Patient Instructions (Addendum)
[facility] Clinic (pharmacist) Tyrese Capriotti/Kristin [phone]  *START trial with Nexletol 180mg , will follow up by phone in 10 days to determine of okay to send prescription to [facility] at [email or web address] OR [phone] for financial assistance if needed   High Cholesterol  High cholesterol is a condition in which the blood has high levels of a white, waxy, fat-like substance (cholesterol). The human body needs small amounts of cholesterol. The liver makes all the cholesterol that the body needs. Extra (excess) cholesterol comes from the food that we eat. Cholesterol is carried from the liver by the blood through the blood vessels. If you have high cholesterol, deposits (plaques) may build up on the walls of your blood vessels (arteries). Plaques make the arteries narrower and stiffer. Cholesterol plaques increase your risk for heart attack and stroke. Work with your health care provider to keep your cholesterol levels in a healthy range. What increases the risk? This condition is more likely to develop in people who:  Eat foods that are high in animal fat (saturated fat) or cholesterol.  Are overweight.  Are not getting enough exercise.  Have a family history of high cholesterol. What are the signs or symptoms? There are no symptoms of this condition. How is this diagnosed? This condition may be diagnosed from the results of a blood test.  If you are older than age 49, your health care provider may check your cholesterol every 4-6 years.  You may be checked more often if you already have high cholesterol or other risk factors for heart disease. The blood test for cholesterol measures:  "Bad" cholesterol (LDL cholesterol). This is the main type of cholesterol that causes heart disease. The desired level for LDL is less than 100.  "Good" cholesterol (HDL cholesterol). This type helps to protect against heart disease by cleaning the  arteries and carrying the LDL away. The desired level for HDL is 60 or higher.  Triglycerides. These are fats that the body can store or burn for energy. The desired number for triglycerides is lower than 150.  Total cholesterol. This is a measure of the total amount of cholesterol in your blood, including LDL cholesterol, HDL cholesterol, and triglycerides. A healthy number is less than 200. How is this treated? This condition is treated with diet changes, lifestyle changes, and medicines. Diet changes  This may include eating more whole grains, fruits, vegetables, nuts, and fish.  This may also include cutting back on red meat and foods that have a lot of added sugar. Lifestyle changes  Changes may include getting at least 40 minutes of aerobic exercise 3 times a week. Aerobic exercises include walking, biking, and swimming. Aerobic exercise along with a healthy diet can help you maintain a healthy weight.  Changes may also include quitting smoking. Medicines  Medicines are usually given if diet and lifestyle changes have failed to reduce your cholesterol to healthy levels.  Your health care provider may prescribe a statin medicine. Statin medicines have been shown to reduce cholesterol, which can reduce the risk of heart disease. Follow these instructions at home: Eating and drinking If told by your health care provider:  Eat chicken (without skin), fish, veal, shellfish, ground [place] breast, and round or loin cuts of red meat.  Do not eat fried foods or fatty meats, such as hot dogs and salami.  Eat plenty of fruits, such as apples.  Eat plenty of vegetables, such as broccoli, potatoes, and carrots.  Eat beans, peas, and lentils.  Eat  grains such as barley, rice, couscous, and bulgur wheat.  Eat pasta without cream sauces.  Use skim or nonfat milk, and eat low-fat or nonfat yogurt and cheeses.  Do not eat or drink whole milk, cream, ice cream, egg yolks, or hard  cheeses.  Do not eat stick margarine or tub margarines that contain trans fats (also called partially hydrogenated oils).  Do not eat saturated tropical oils, such as coconut oil and palm oil.  Do not eat cakes, cookies, crackers, or other baked goods that contain trans fats.  General instructions  Exercise as directed by your health care provider. Increase your activity level with activities such as gardening, walking, and taking the stairs.  Take over-the-counter and prescription medicines only as told by your health care provider.  Do not use any products that contain nicotine or tobacco, such as cigarettes and e-cigarettes. If you need help quitting, ask your health care provider.  Keep all follow-up visits as told by your health care provider. This is important. Contact a health care provider if:  You are struggling to maintain a healthy diet or weight.  You need help to start on an exercise program.  You need help to stop smoking. Get help right away if:  You have chest pain.  You have trouble breathing. This information is not intended to replace advice given to you by your health care provider. Make sure you discuss any questions you have with your health care provider. Document Released: 11/22/2005 Document Revised: 11/25/2017 Document Reviewed: 05/22/2016 Elsevier Patient Education  2020 [employer].

## 2019-11-09 NOTE — Progress Notes (Signed)
Methylprednisolone 40mg /ml Used 13ml NDC [id number] 08/04/20

## 2019-11-12 NOTE — Progress Notes (Signed)
Cardiology Office Note  Date:  11/13/2019   ID:  [name] [name], DOB 01/12/1951, MRN [id number]  PCP:  Venia Carbon, MD   Chief Complaint  Patient presents with  . office visit    1 month F/U after echo-Patient states he is still SOB; Meds verbally reviewed with patient.    HPI:  68 year-old gentleman with  obesity,  hyperlipidemia,  diabetes,  hypertension,  CAD, s/p CABG.  severe LAD disease, bypass surgery November 12 2011.  (Coronary artery bypass grafting x3 with the left internal mammary to the left anterior descending coronary artery, reversed saphenous vein graft sequentially to the first and third diagonals with right thigh and the vein harvesting.) Statin and zetia intolerant, myalgias and joint pain Chronic shortness of breath He presents for routine followup of his coronary artery disease   Followed by pharmacy in [facility] on bempedoic acid, last week LDL 132 before starting medication  Cath , results reviewed with him in detail 1. Three-vessel coronary artery disease, including 60% ostial/proximal LMCA stenosis (MLA 5.4 mm^2 by IVUS), chronic total occlusion of the proximal LAD (known patent LIMA-LAD and SVG-D1 and D2), 90% OM1 stenosis, and 50% mid/distal RCA stenosis. 2. Widely patent proximal RCA stent. 3. Normal left ventricular filling pressure. 4. Successful IVUS-guided PCI to the ostial through mid LMCA using Resolute Onyx 3.5 12 mm drug-eluting stent (postdilated to 4.2 mm) with 0% residual stenosis and TIMI-3 flow. 5. Successful PCI to OM1 using Resolute Onyx 2.25 x 26 mm drug-eluting stent with 0% residual stenosis and TIMI-3 flow.   works on [employer], also farming work with cattle  no regular exercise program Reports breathing continues to be an issue Breathing seem to percent after bypass surgery years ago No improvement in his shortness of breath after recent stent placement   EKG personally reviewed by myself on  todays visit shows normal sinus rhythm with rate 65 bpm, first-degree AV block  Other past medical history unable to tolerate a statin including Crestor, Lipitor, simvastatin., zetia Myalgias, "inflammation"  Previous lab work, Hemoglobin A1c of 8, Total cholesterol 226  Previously had significant shortness of breath. This seems to have improved. Continues to have  bilateral knee pain.  On prior office visits, reported that After a day in his boots, knees are very sore.    unable to tolerate metoprolol secondary to memory problems and mood disorder. Unable to tolerate carvedilol secondary to fatigue  Previous  Echocardiogram  for his shortness of breath showed normal function, no significant pulmonary hypertension. Normal right ventricular systolic pressure.  PMH:   has a past medical history of Angina, Arthritis, BPH (benign prostatic hypertrophy), Carpal tunnel syndrome, Coronary artery disease, GERD (gastroesophageal reflux disease), colonic polyps, Hyperlipidemia, Hypertension, Kidney stones, Morbid obesity ([place]), OSA (obstructive sleep apnea), and Type II diabetes mellitus ([place]).  PSH:    Past Surgical History:  Procedure Laterality Date  . CORONARY ARTERY BYPASS GRAFT  11/12/2011   Procedure: CORONARY ARTERY BYPASS GRAFTING (CABG);  Surgeon: Grace Isaac, MD;  Location: [facility];  Service: Open Heart Surgery;  Laterality: N/A;  Coronary Artery Bypass Graft times three on pump utilizing left internal mammary artery and right saphenous vein harvested endoscopically   . CORONARY STENT INTERVENTION N/A 09/27/2019   Procedure: CORONARY STENT INTERVENTION;  Surgeon: Wellington Hampshire, MD;  Location: [facility];  Service: Cardiovascular;  Laterality: N/A;  . CORONARY STENT INTERVENTION N/A 10/01/2019   Procedure: CORONARY STENT INTERVENTION;  Surgeon:  End, Harrell Gave, MD;  Location: [facility];  Service: Cardiovascular;  Laterality: N/A;  . HERNIA REPAIR    .  INTRAVASCULAR PRESSURE WIRE/FFR STUDY N/A 09/27/2019   Procedure: INTRAVASCULAR PRESSURE WIRE/FFR STUDY;  Surgeon: Wellington Hampshire, MD;  Location: [facility];  Service: Cardiovascular;  Laterality: N/A;  . INTRAVASCULAR ULTRASOUND/IVUS N/A 10/01/2019   Procedure: Intravascular Ultrasound/IVUS;  Surgeon: Nelva Bush, MD;  Location: [facility];  Service: Cardiovascular;  Laterality: N/A;  . KNEE ARTHROPLASTY Right 11/23/2017   Procedure: COMPUTER ASSISTED TOTAL KNEE ARTHROPLASTY;  Surgeon: Dereck Leep, MD;  Location: [facility];  Service: Orthopedics;  Laterality: Right;  . KNEE SURGERY     left  . LEFT HEART CATH AND CORONARY ANGIOGRAPHY Left 09/27/2019   Procedure: LEFT HEART CATH AND CORONARY ANGIOGRAPHY;  Surgeon: Minna Merritts, MD;  Location: [facility];  Service: Cardiovascular;  Laterality: Left;  . LEFT HEART CATH AND CORONARY ANGIOGRAPHY N/A 10/01/2019   Procedure: LEFT HEART CATH AND CORONARY ANGIOGRAPHY;  Surgeon: Nelva Bush, MD;  Location: [facility];  Service: Cardiovascular;  Laterality: N/A;  . MENISCUS REPAIR  03/08   right knee    Current Outpatient Medications  Medication Sig Dispense Refill  . aspirin EC 81 MG tablet Take 1 tablet (81 mg total) by mouth daily. 90 tablet 3  . Bempedoic Acid 180 MG TABS Take 180 mg by mouth daily. 14 tablet 0  . canagliflozin (INVOKANA) 300 MG TABS tablet Take 1 tablet (300 mg total) by mouth daily before breakfast. 90 tablet 3  . clopidogrel (PLAVIX) 75 MG tablet Take 1 tablet (75 mg total) by mouth daily with breakfast. 90 tablet 2  . furosemide (LASIX) 40 MG tablet Take 1 tablet (40 mg total) by mouth daily as needed. (Patient taking differently: Take 40 mg by mouth daily as needed for fluid. ) 90 tablet 3  . glucose blood (FREESTYLE LITE) test strip Use as instructed, patient tests once daily. Dx: 250.00 300 each 3  . HYDROcodone-acetaminophen (NORCO/VICODIN) 5-325 MG tablet Take 1  tablet by mouth 2 (two) times daily as needed (pain). 60 tablet 0  . lisinopril (ZESTRIL) 20 MG tablet Take 1 tablet (20 mg total) by mouth daily. 90 tablet 3  . metFORMIN (GLUCOPHAGE) 1000 MG tablet Take 1 tablet (1,000 mg total) by mouth 2 (two) times daily with a meal. 180 tablet 3  . nitroGLYCERIN (NITROSTAT) 0.4 MG SL tablet Place 1 tablet (0.4 mg total) under the tongue every 5 (five) minutes as needed for chest pain. 25 tablet 3  . potassium chloride SA (K-DUR,KLOR-CON) 20 MEQ tablet Take 1 tablet (20 mEq total) by mouth 2 (two) times daily as needed (Take with lasix). 90 tablet 3  . tamsulosin (FLOMAX) 0.4 MG CAPS capsule Take 1 capsule (0.4 mg total) by mouth daily. 90 capsule 3  . triamcinolone cream (KENALOG) 0.1 % Apply 1 application topically 2 (two) times daily as needed. (Patient taking differently: Apply 1 application topically 2 (two) times daily as needed (irritation). ) 30 g 0  . albuterol (VENTOLIN HFA) 108 (90 Base) MCG/ACT inhaler Inhale 2 puffs into the lungs every 6 (six) hours as needed for wheezing or shortness of breath. 90 g 0   No current facility-administered medications for this visit.      Allergies:   Doxazosin mesylate, Tetracyclines & related, Carvedilol, Crestor [rosuvastatin], Doxycycline, Glimepiride, Glipizide, Losartan potassium-hctz, Metoprolol tartrate, Pravastatin, Rosiglitazone maleate, and Simvastatin   Social  History:  The patient  reports that he has never smoked. He has never used smokeless tobacco. He reports that he does not drink alcohol or use drugs.   Family History:   family history includes Coronary artery disease in his father; Prostate cancer in his father.    Review of Systems: Review of Systems  Constitutional: Negative.   HENT: Negative.   Respiratory: Positive for shortness of breath.   Cardiovascular: Negative.   Gastrointestinal: Negative.   Musculoskeletal: Positive for joint pain.  Neurological: Negative.    Psychiatric/Behavioral: Negative.   All other systems reviewed and are negative.   PHYSICAL EXAM: VS:  BP 110/70 (BP Location: Left Arm, Patient Position: Sitting, Cuff Size: Normal)   Pulse 65   Temp (!) 97.2 F (36.2 C)   Ht 5\' 4"  (1.626 m)   Wt 210 lb 8 oz (95.5 kg)   SpO2 98%   BMI 36.13 kg/m  , BMI Body mass index is 36.13 kg/m.  Constitutional:  oriented to person, place, and time. No distress.  Obese HENT:  Head: Grossly normal Eyes:  no discharge. No scleral icterus.  Neck: No JVD, no carotid bruits  Cardiovascular: Regular rate and rhythm, no murmurs appreciated Pulmonary/Chest: Clear to auscultation bilaterally, no wheezes or rails Abdominal: Soft.  no distension.  no tenderness.  Musculoskeletal: Normal range of motion Neurological:  normal muscle tone. Coordination normal. No atrophy Skin: Skin warm and dry Psychiatric: normal affect, pleasant   Recent Labs: 09/05/2019: ALT 15 10/09/2019: BUN 18; Creatinine, Ser 1.05; Hemoglobin 15.5; Platelets 260; Potassium 4.5; Sodium 137    Lipid Panel Lab Results  Component Value Date   CHOL 217 (H) 09/05/2019   HDL 45.30 09/05/2019   LDLCALC 130 (H) 03/20/2018   TRIG 223.0 (H) 09/05/2019      Wt Readings from Last 3 Encounters:  11/13/19 210 lb 8 oz (95.5 kg)  11/08/19 210 lb (95.3 kg)  11/07/19 208 lb (94.3 kg)     ASSESSMENT AND PLAN:  Essential hypertension - Plan: EKG 12-Lead Well-controlled, no medication changes made  Coronary artery disease involving native coronary artery of native heart without angina pectoris -  Recent stent placement, no improvement in his shortness of breath interestingly Stressed importance of aggressive lipid management Would likely do well on Repatha to reach goal LDL less than 70 This was discussed with him.  He will talk to pharmacy in [place] Has been on bempedoic acid 5 days no side effects -Released to go back to work  Pure hypercholesterolemia Plan as  above  OSA (obstructive sleep apnea) Recommended weight loss Does not do any regular exercise program  Uncontrolled type 2 diabetes mellitus with complication, without long-term current use of insulin ([place]) Long history of poorly controlled, poor diet, weight trending up  S/P CABG (coronary artery bypass graft) No angina, no further testing at this time Feels having shortness of breath ever since bypass surgery, wonders if chest was "made too tight"  Shortness of breath Some occupational exposure allergy 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 EKG 12-Lead     Signed, Esmond Plants, M.D., Ph.D. 11/13/2019  [place]  [phone]  \ 

## 2019-11-13 ENCOUNTER — Ambulatory Visit (INDEPENDENT_AMBULATORY_CARE_PROVIDER_SITE_OTHER): Payer: BC Managed Care – PPO | Admitting: Cardiovascular Disease

## 2019-11-13 ENCOUNTER — Other Ambulatory Visit: Payer: Self-pay

## 2019-11-13 ENCOUNTER — Encounter: Payer: Self-pay | Admitting: Cardiovascular Disease

## 2019-11-13 VITALS — BP 110/70 | HR 65 | Temp 97.2°F | Ht 64.0 in | Wt 210.5 lb

## 2019-11-13 DIAGNOSIS — Z951 Presence of aortocoronary bypass graft: Secondary | ICD-10-CM | POA: Diagnosis not present

## 2019-11-13 DIAGNOSIS — E1165 Type 2 diabetes mellitus with hyperglycemia: Secondary | ICD-10-CM | POA: Diagnosis not present

## 2019-11-13 DIAGNOSIS — I25118 Atherosclerotic heart disease of native coronary artery with other forms of angina pectoris: Secondary | ICD-10-CM | POA: Diagnosis not present

## 2019-11-13 DIAGNOSIS — I2510 Atherosclerotic heart disease of native coronary artery without angina pectoris: Secondary | ICD-10-CM

## 2019-11-13 DIAGNOSIS — E785 Hyperlipidemia, unspecified: Secondary | ICD-10-CM

## 2019-11-13 DIAGNOSIS — I10 Essential (primary) hypertension: Secondary | ICD-10-CM

## 2019-11-13 DIAGNOSIS — IMO0002 Reserved for concepts with insufficient information to code with codable children: Secondary | ICD-10-CM

## 2019-11-13 DIAGNOSIS — Z9861 Coronary angioplasty status: Secondary | ICD-10-CM

## 2019-11-13 DIAGNOSIS — E118 Type 2 diabetes mellitus with unspecified complications: Secondary | ICD-10-CM | POA: Diagnosis not present

## 2019-11-13 MED ORDER — ALBUTEROL SULFATE HFA 108 (90 BASE) MCG/ACT IN AERS: 2.0000 | INHALATION_SPRAY | Freq: Four times a day (QID) | RESPIRATORY_TRACT | 0 refills | Status: DC | PRN

## 2019-11-13 NOTE — Patient Instructions (Addendum)
Work note, Ok to go back next Monday  Ask pharmacy about the once a month injection, repatha   Medication Instructions:  Albuterol inhaler as needed 1-2 puffs q 6 hrs as needed   If you need a refill on your cardiac medications before your next appointment, please call your pharmacy.    Lab work: No new labs needed   If you have labs (blood work) drawn today and your tests are completely normal, you will receive your results only by: Marland Kitchen MyChart Message (if you have MyChart) OR . A paper copy in the mail If you have any lab test that is abnormal or we need to change your treatment, we will call you to review the results.   Testing/Procedures: No new testing needed   Follow-Up: At [facility], you and your health needs are our priority.  As part of our continuing mission to provide you with exceptional heart care, we have created designated Provider Care Teams.  These Care Teams include your primary Cardiologist (physician) and Advanced Practice Providers (APPs -  Physician Assistants and Nurse Practitioners) who all work together to provide you with the care you need, when you need it.  . You will need a follow up appointment in 6 months   . Providers on your designated Care Team:   . Murray Hodgkins, NP . Christell Faith, PA-C . Marrianne Mood, PA-C  Any Other Special Instructions Will Be Listed Below (If Applicable).  For educational health videos Log in to : [email or web address] Or : [email or web address], password : [email or web address]

## 2019-11-15 ENCOUNTER — Other Ambulatory Visit: Payer: Self-pay | Admitting: Cardiovascular Disease

## 2019-11-16 ENCOUNTER — Telehealth: Payer: Self-pay | Admitting: Cardiovascular Disease

## 2019-11-16 NOTE — Telephone Encounter (Signed)
Received records request [employer] forwarded to [facility] for processing. Also faxed and scanend

## 2019-11-19 ENCOUNTER — Telehealth: Payer: Self-pay

## 2019-11-19 NOTE — Telephone Encounter (Signed)
-----   Message from [place], [facility] sent at 11/19/2019  1:25 PM EST ----- Taking Nexletol samples? Tolerating? We need to submit PA is patient is to continue Nexletol

## 2019-11-19 NOTE — Telephone Encounter (Signed)
lmomed the pt asking him to call us back to talk about nexletol

## 2019-11-20 NOTE — Telephone Encounter (Signed)
[employer] forms completed and placed on Sabrina's desk.

## 2019-11-20 NOTE — Telephone Encounter (Signed)
Sent completed forms to [employer] via interoffice. 

## 2019-11-21 ENCOUNTER — Other Ambulatory Visit: Payer: Self-pay | Admitting: Internal Medicine

## 2019-11-21 ENCOUNTER — Telehealth: Payer: Self-pay

## 2019-11-21 NOTE — Telephone Encounter (Signed)
Called and spoke with pt and they stated they would rather do the repatha pushtronex since dr Rockey Situ recommended it. I have sent in a pa and instructed the pt to await my call to send in the rx. The pt voiced understanding

## 2019-11-21 NOTE — Telephone Encounter (Signed)
Name of Medication: Hydrocodone Name of Pharmacy: [facility] or Written Date and Quantity: 10-11-19 #60 Last Office Visit and Type: 11-07-19 Next Office Visit and Type: 03-04-20 Last Controlled Substance Agreement Date: 09-05-19 Last UDS: 09-05-19

## 2019-11-23 ENCOUNTER — Telehealth: Payer: Self-pay

## 2019-11-23 NOTE — Telephone Encounter (Signed)
Nexletol has been approved from 09/22/2019 to 11/20/2020.

## 2019-11-26 ENCOUNTER — Telehealth: Payer: Self-pay

## 2019-11-26 MED ORDER — REPATHA PUSHTRONEX SYSTEM 420 MG/3.5ML ~~LOC~~ SOCT: 1.0000 | SUBCUTANEOUS | 0 refills | Status: DC

## 2019-11-26 NOTE — Telephone Encounter (Signed)
Called and spoke w/pt regarding the approval of the repatha pushtronex, rx sent, and [employer] granted applied online for the pt and emailed the information to him

## 2019-11-28 DIAGNOSIS — Z96651 Presence of right artificial knee joint: Secondary | ICD-10-CM | POA: Diagnosis not present

## 2019-11-28 DIAGNOSIS — M1711 Unilateral primary osteoarthritis, right knee: Secondary | ICD-10-CM | POA: Diagnosis not present

## 2019-12-03 ENCOUNTER — Encounter: Payer: Self-pay | Admitting: Internal Medicine

## 2019-12-03 ENCOUNTER — Ambulatory Visit (INDEPENDENT_AMBULATORY_CARE_PROVIDER_SITE_OTHER): Payer: BC Managed Care – PPO | Admitting: Internal Medicine

## 2019-12-03 ENCOUNTER — Other Ambulatory Visit: Payer: Self-pay

## 2019-12-03 VITALS — BP 122/60 | HR 70 | Temp 97.3°F | Ht 64.0 in | Wt 206.4 lb

## 2019-12-03 DIAGNOSIS — I2510 Atherosclerotic heart disease of native coronary artery without angina pectoris: Secondary | ICD-10-CM

## 2019-12-03 DIAGNOSIS — R0600 Dyspnea, unspecified: Secondary | ICD-10-CM | POA: Diagnosis not present

## 2019-12-03 DIAGNOSIS — G4733 Obstructive sleep apnea (adult) (pediatric): Secondary | ICD-10-CM | POA: Diagnosis not present

## 2019-12-03 DIAGNOSIS — R0602 Shortness of breath: Secondary | ICD-10-CM

## 2019-12-03 DIAGNOSIS — Z9861 Coronary angioplasty status: Secondary | ICD-10-CM

## 2019-12-03 NOTE — Progress Notes (Signed)
Subjective:    Patient ID: [name] [name], male    DOB: 04/03/1951, 68 y.o.   MRN: [id number]  HPI male never smoker followed for OSA, complicated by DM 2, CAD/ CABG, HBP, GERD, chronic pain/narcotic dependence, hyperlipidemia, BPH NPSG 1998:  AHI 58/hr.  --------------------------------------------------------------------------  02/23/18- 68 year old male never smoker followed for OSA, complicated by DM 2, CAD/ CABG, HBP, GERD, chronic pain/narcotic dependence, CPAP 10/[facility] ----OSA: DME [facility] Pt states he wears CPAP nightly no new supplies needed at this time. No DL at this time.  Got through right total knee replacement in December without problems. Current CPAP machine is 58 or 68 years old.  He reports sleeping all night every night with it and sleeping better with CPAP than without. Asks advice about managing seasonal allergic rhinitis as that starts.  12/03/2019- 68 year old male never smoker followed for OSA, complicated by DM 2, CAD/ CABG, HBP, GERD, chronic pain/narcotic dependence, hyperlipidemia, BPH, CPAP 10/[facility] -----1 year f/u OSA.patient stated breathing is not good. SOB sitting,walking .  Had normal spirometry in 2015. Has Ventolin HFA.  Not anemic. Body weight today 206 lbs No download today                Machine is 69 years old.  Noting shortness of breath with rest and exertion over past couple of months. Some better since cardiac stents 2 months ago. Ventolin no help. Little cough or wheeze. Has been sneezing more- blames cold air.  CXR 09/30/2019-  The heart remains enlarged. Median sternotomy wires are redemonstrated. The lungs are clear. There is no pleural effusion or pneumothorax. Degenerative changes are seen in the spine. IMPRESSION: No active cardiopulmonary disease.   ROS-see HPI   + = positive Constitutional:    weight loss, night sweats, fevers, chills, fatigue, lassitude. HEENT:    headaches, difficulty swallowing, tooth/dental problems, sore  throat,       +sneezing, itching, ear ache, +nasal congestion, post nasal drip, snoring CV:    chest pain, orthopnea, PND, swelling in lower extremities, anasarca,                                                   dizziness, palpitations Resp:   shortness of breath with exertion or at rest.                productive cough,   non-productive cough, coughing up of blood.              change in color of mucus.  wheezing.   Skin:    rash or lesions. GI:  No-   heartburn, indigestion, abdominal pain, nausea, vomiting, diarrhea,                 change in bowel habits, loss of appetite GU: dysuria, change in color of urine, no urgency or frequency.   flank pain. MS:   joint pain, stiffness, decreased range of motion, back pain. Neuro-     nothing unusual Psych:  change in mood or affect.  depression or anxiety.   memory loss.    Objective:  OBJ- Physical Exam General- Alert, Oriented, Affect-appropriate, Distress- none acute, + Full beard,  Skin- rash-none, lesions- none, excoriation- none Lymphadenopathy- none Head- atraumatic            Eyes- Gross vision intact, PERRLA, conjunctivae and secretions clear  Ears- Hearing, canals-normal            Nose- Clear, no-Septal dev, mucus, polyps, erosion, perforation             Throat- Mallampati II-III , mucosa clear , drainage- none, tonsils- atrophic Neck- flexible , trachea midline, no stridor , thyroid nl, carotid no bruit Chest - symmetrical excursion , unlabored           Heart/CV- RRR , no murmur , no gallop  , no rub, nl s1 s2                           - JVD- none , edema- none, stasis changes- none, varices- none           Lung- clear to P&A, wheeze- none, cough- none , dullness-none, rub- none           Chest wall-  Abd-  Br/ Gen/ Rectal- Not done, not indicated Extrem- cyanosis- none, clubbing, none, atrophy- none, strength- nl Neuro- grossly intact to observation    Assessment & Plan:

## 2019-12-03 NOTE — Patient Instructions (Addendum)
Order- DME [facility]  Please replace old CPAP machine, change to auto 5-15, mask of choice, humidifier, supplies, Airview/ card  Order- schedule PFT   Dx Dyspnea  Suggest trying Flonase otc nasal spray    2 puffs each nostril each night at bedtime. See if this helps your nose.  Ask Huey Romans for advice on using your CPAP nebulizer to warm the air. This may also help your nose.

## 2019-12-12 ENCOUNTER — Other Ambulatory Visit: Payer: Self-pay | Admitting: Internal Medicine

## 2020-01-03 ENCOUNTER — Other Ambulatory Visit: Payer: Self-pay | Admitting: Internal Medicine

## 2020-01-06 ENCOUNTER — Encounter: Payer: Self-pay | Admitting: Internal Medicine

## 2020-01-06 NOTE — Assessment & Plan Note (Signed)
Never smoker with previously ok PFT and not anemic. Most likely this is cardiac dyspnea.  Plan- Update PFT. He will discuss dyspnea with his cardiologist

## 2020-01-06 NOTE — Assessment & Plan Note (Signed)
Machine is old.  Plan- replace old machine and change to auto 5-15

## 2020-01-21 ENCOUNTER — Telehealth: Payer: Self-pay | Admitting: Cardiovascular Disease

## 2020-01-21 MED ORDER — REPATHA PUSHTRONEX SYSTEM 420 MG/3.5ML ~~LOC~~ SOCT: 1.0000 | SUBCUTANEOUS | 11 refills | Status: DC

## 2020-01-21 NOTE — Telephone Encounter (Signed)
New Message    Pt is calling and is wondering when he is suppose to come in for his next injection     Please advise

## 2020-01-21 NOTE — Telephone Encounter (Signed)
Called and spoke w/pt regarding dosage information, pt voiced understanding, refill sent

## 2020-01-21 NOTE — Telephone Encounter (Signed)
Patient states that he is calling to follow up in regards to the status of previously discussed grant for $2500.00 in order to have Repatha medication refilled. Please call to discuss.

## 2020-02-17 ENCOUNTER — Encounter: Payer: Self-pay | Admitting: Internal Medicine

## 2020-02-20 ENCOUNTER — Other Ambulatory Visit: Payer: Self-pay | Admitting: Internal Medicine

## 2020-02-21 NOTE — Telephone Encounter (Signed)
Name of [place] Name of Pharmacy:[facility] Last [place] or Written Date and Quantity: 01-03-20 #60 Last Office Visit and Type:11-07-19 Next Office Visit and Type:03-04-20 Last Controlled Substance Agreement Date:09-05-19 Last UDS:09-05-19

## 2020-03-04 ENCOUNTER — Ambulatory Visit (INDEPENDENT_AMBULATORY_CARE_PROVIDER_SITE_OTHER): Payer: BC Managed Care – PPO | Admitting: Internal Medicine

## 2020-03-04 ENCOUNTER — Encounter: Payer: Self-pay | Admitting: Internal Medicine

## 2020-03-04 ENCOUNTER — Other Ambulatory Visit: Payer: Self-pay

## 2020-03-04 VITALS — BP 110/66 | HR 73 | Temp 97.5°F | Ht 64.0 in | Wt 207.0 lb

## 2020-03-04 DIAGNOSIS — E1159 Type 2 diabetes mellitus with other circulatory complications: Secondary | ICD-10-CM | POA: Diagnosis not present

## 2020-03-04 DIAGNOSIS — G894 Chronic pain syndrome: Secondary | ICD-10-CM

## 2020-03-04 DIAGNOSIS — F1120 Opioid dependence, uncomplicated: Secondary | ICD-10-CM

## 2020-03-04 DIAGNOSIS — I25118 Atherosclerotic heart disease of native coronary artery with other forms of angina pectoris: Secondary | ICD-10-CM

## 2020-03-04 DIAGNOSIS — M7552 Bursitis of left shoulder: Secondary | ICD-10-CM | POA: Diagnosis not present

## 2020-03-04 LAB — POCT GLYCOSYLATED HEMOGLOBIN (HGB A1C): Hemoglobin A1C: 8.5 % — AB (ref 4.0–5.6)

## 2020-03-04 NOTE — Assessment & Plan Note (Signed)
Reviewed PDMP--no concerns

## 2020-03-04 NOTE — Progress Notes (Signed)
Subjective:    Patient ID: [name] [name], male    DOB: Apr 09, 1951, 69 y.o.   MRN: [id number]  HPI  Here for follow up of chronic pain and diabetes This visit occurred during the SARS-CoV-2 public health emergency.  Safety protocols were in place, including screening questions prior to the visit, additional usage of staff PPE, and extensive cleaning of exam room while observing appropriate contact time as indicated for disinfecting solutions.   Since starting plavix last fall, he notices skin peels off lips and gums  No bleeding Then heals back up again Notices soreness No chest pain or SOB Stays active on farm and work----no activity symptoms (just fatigue)  Back pain is about the same More pain between his shoulder blades--may have done that on the tractor Did improve after the cortisone shot--but may have pulled it out again Uses the hydrocodone regularly---tries to take just one a day if he can get by  Was able to restart the invokana No genital rash or other side effects Checks sugars rarely--145  Current Outpatient Medications on File Prior to Visit  Medication Sig Dispense Refill  . albuterol (VENTOLIN HFA) 108 (90 Base) MCG/ACT inhaler Inhale 2 puffs into the lungs every 6 (six) hours as needed for wheezing or shortness of breath. 90 g 0  . aspirin EC 81 MG tablet Take 1 tablet (81 mg total) by mouth daily. 90 tablet 3  . Bempedoic Acid 180 MG TABS Take 180 mg by mouth daily. 14 tablet 0  . canagliflozin (INVOKANA) 300 MG TABS tablet Take 1 tablet (300 mg total) by mouth daily before breakfast. 90 tablet 3  . clopidogrel (PLAVIX) 75 MG tablet Take 1 tablet (75 mg total) by mouth daily with breakfast. 90 tablet 2  . Evolocumab with Infusor ([place]) 420 MG/3.5ML SOCT Inject 1 applicator into the skin every 30 (thirty) days. 3.6 mL 11  . furosemide (LASIX) 40 MG tablet TAKE 1 TABLET DAILY AS NEEDED 90 tablet 2  . glucose blood (FREESTYLE LITE) test  strip Use as instructed, patient tests once daily. Dx: 250.00 300 each 3  . HYDROcodone-acetaminophen (NORCO/VICODIN) 5-325 MG tablet TAKE 1 TABLET BY MOUTH TWICE (2) DAILY 60 tablet 0  . lisinopril (ZESTRIL) 20 MG tablet Take 1 tablet (20 mg total) by mouth daily. 90 tablet 3  . metFORMIN (GLUCOPHAGE) 1000 MG tablet TAKE 1 TABLET BY MOUTH TWICE (2) DAILY WITH A MEAL 180 tablet 3  . nitroGLYCERIN (NITROSTAT) 0.4 MG SL tablet Place 1 tablet (0.4 mg total) under the tongue every 5 (five) minutes as needed for chest pain. 25 tablet 3  . potassium chloride SA (K-DUR,KLOR-CON) 20 MEQ tablet Take 1 tablet (20 mEq total) by mouth 2 (two) times daily as needed (Take with lasix). 90 tablet 3  . tamsulosin (FLOMAX) 0.4 MG CAPS capsule Take 1 capsule (0.4 mg total) by mouth daily. 90 capsule 3  . triamcinolone cream (KENALOG) 0.1 % Apply 1 application topically 2 (two) times daily as needed. (Patient taking differently: Apply 1 application topically 2 (two) times daily as needed (irritation). ) 30 g 0   No current facility-administered medications on file prior to visit.    Allergies  Allergen Reactions  . Doxazosin Mesylate Other (See Comments)    REACTION: didn't work and may have caused SOB  . Tetracyclines & Related   . Carvedilol Other (See Comments)    Felt bad  . Crestor [Rosuvastatin] Other (See Comments)    Muscle aches.  Marland Kitchen  Doxycycline Other (See Comments)    REACTION: unspecified  . Glimepiride Other (See Comments)    REACTION: red eyes  . Glipizide Other (See Comments)    REACTION: myalgia??  . Losartan Potassium-Hctz Other (See Comments)    REACTION: chest \\T \ leg pain  . Metoprolol Tartrate Other (See Comments)    Mood swings, memory changes  . Pravastatin Other (See Comments)    Muscle aches   . Rosiglitazone Maleate Other (See Comments)    REACTION: myalgia  . Simvastatin Other (See Comments)    REACTION: myalgias    Past Medical History:  Diagnosis Date  . Angina   .  Arthritis   . BPH (benign prostatic hypertrophy)   . Carpal tunnel syndrome   . Coronary artery disease    a. 11/2011 s/p CABG x 3  (LIMA-LAD, SeqSVG-D1-D3); b. 09/2019 CATH-RCA PCI: LM 50ost, LAD 100ost/p, Sev diff diag dzs, LCX nl, OM1 67m, OM3 40, RCA 70ost (iFR 0.88--> 3.0x26 Resolute Onyx DES), 73m, LIMA->LAD ok. VG->D1->D3 ok. EF 50-55%.; c) 10/01/2019: staged PCI Ost LM (Resolute Onyx 3.5 x 12 - 4.2 mm) & Ost OM1 (Resolute Onyx 2.25 x 26 -- 2.5 mm)  . GERD (gastroesophageal reflux disease)   . Hx of colonic polyps   . Hyperlipidemia   . Hypertension   . Kidney stones   . Morbid obesity ([place])   . OSA (obstructive sleep apnea)    uses cpap  . Type II diabetes mellitus ([place])     Past Surgical History:  Procedure Laterality Date  . CORONARY ARTERY BYPASS GRAFT  11/12/2011   Procedure: CORONARY ARTERY BYPASS GRAFTING (CABG);  Surgeon: Grace Isaac, MD;  Location: [facility];  Service: Open Heart Surgery;  Laterality: N/A;  Coronary Artery Bypass Graft times three on pump utilizing left internal mammary artery and right saphenous vein harvested endoscopically   . CORONARY STENT INTERVENTION N/A 09/27/2019   Procedure: CORONARY STENT INTERVENTION;  Surgeon: Wellington Hampshire, MD;  Location: [facility];  Service: Cardiovascular;  Laterality: N/A;  . CORONARY STENT INTERVENTION N/A 10/01/2019   Procedure: CORONARY STENT INTERVENTION;  Surgeon: Nelva Bush, MD;  Location: [facility];  Service: Cardiovascular;  Laterality: N/A;  . HERNIA REPAIR    . INTRAVASCULAR PRESSURE WIRE/FFR STUDY N/A 09/27/2019   Procedure: INTRAVASCULAR PRESSURE WIRE/FFR STUDY;  Surgeon: Wellington Hampshire, MD;  Location: [facility];  Service: Cardiovascular;  Laterality: N/A;  . INTRAVASCULAR ULTRASOUND/IVUS N/A 10/01/2019   Procedure: Intravascular Ultrasound/IVUS;  Surgeon: Nelva Bush, MD;  Location: [facility];  Service: Cardiovascular;  Laterality: N/A;  . KNEE  ARTHROPLASTY Right 11/23/2017   Procedure: COMPUTER ASSISTED TOTAL KNEE ARTHROPLASTY;  Surgeon: Dereck Leep, MD;  Location: [facility];  Service: Orthopedics;  Laterality: Right;  . KNEE SURGERY     left  . LEFT HEART CATH AND CORONARY ANGIOGRAPHY Left 09/27/2019   Procedure: LEFT HEART CATH AND CORONARY ANGIOGRAPHY;  Surgeon: Minna Merritts, MD;  Location: [facility];  Service: Cardiovascular;  Laterality: Left;  . LEFT HEART CATH AND CORONARY ANGIOGRAPHY N/A 10/01/2019   Procedure: LEFT HEART CATH AND CORONARY ANGIOGRAPHY;  Surgeon: Nelva Bush, MD;  Location: [facility];  Service: Cardiovascular;  Laterality: N/A;  . MENISCUS REPAIR  03/08   right knee    Family History  Problem Relation Age of Onset  . Coronary artery disease Father   . Prostate cancer Father   . Diabetes Neg Hx   .  Hypertension Neg Hx     Social History   Socioeconomic History  . Marital status: Married    Spouse name: Not on file  . Number of children: 3  . Years of education: Not on file  . Highest education level: Not on file  Occupational History  . Occupation: Surveyor, quantity  Tobacco Use  . Smoking status: Never Smoker  . Smokeless tobacco: Never Used  Substance and Sexual Activity  . Alcohol use: No  . Drug use: No  . Sexual activity: Yes  Other Topics Concern  . Not on file  Social History Narrative   No living will   Requests wife as health care POA   Would accept resuscitation   Would accept tube feedings   Social Determinants of Health   Financial Resource Strain:   . Difficulty of Paying Living Expenses:   Food Insecurity:   . Worried About Charity fundraiser in the Last Year:   . Arboriculturist in the Last Year:   Transportation Needs:   . Film/video editor (Medical):   Marland Kitchen Lack of Transportation (Non-Medical):   Physical Activity:   . Days of Exercise per Week:   . Minutes of Exercise per Session:   Stress:   . Feeling of Stress :   Social  Connections:   . Frequency of Communication with Friends and Family:   . Frequency of Social Gatherings with Friends and Family:   . Attends Religious Services:   . Active Member of Clubs or Organizations:   . Attends Archivist Meetings:   Marland Kitchen Marital Status:   Intimate Partner Violence:   . Fear of Current or Ex-Partner:   . Emotionally Abused:   Marland Kitchen Physically Abused:   . Sexually Abused:    Review of Systems Appetite is fine Weight down slightly in past 6 months Sleeps okay other than with the shoulder pain. Uses a new CPAP machine every night    Objective:   Physical Exam  Constitutional: He appears well-developed. No distress.  HENT:  Oral mucosa all normal  Neck: No thyromegaly present.  Cardiovascular: Normal rate, regular rhythm, normal heart sounds and intact distal pulses. Exam reveals no gallop.  No murmur heard. Respiratory: Effort normal and breath sounds normal. No respiratory distress. He has no wheezes. He has no rales.  Musculoskeletal:        General: No edema.     Comments: Left shoulder without swelling. [place] not really tender today. Mild restriction in ROM throughout  Lymphadenopathy:    He has no cervical adenopathy.  Skin:  No foot lesions           Assessment & Plan:

## 2020-03-04 NOTE — Assessment & Plan Note (Signed)
Doing okay and stays active

## 2020-03-04 NOTE — Assessment & Plan Note (Signed)
Improved but still with pain May be more arthritic and could be related to back issues He is going to try chiropractor---consider orthopedist as well

## 2020-03-04 NOTE — Assessment & Plan Note (Signed)
Having some oral issues that could be related to plavix Asked him to check with cardiologist about this--no findings today on exam

## 2020-03-04 NOTE — Assessment & Plan Note (Addendum)
Not clear how his control is Is tolerating the invokana Will check A1c  Lab Results  Component Value Date   HGBA1C 8.5 (A) 03/04/2020   Frustrating that his control is worse He does admit to nonompliance Discussed improving lifestyle/eating If not better in 3 months, will consider dulaglutide or seeing endocrine

## 2020-03-09 DIAGNOSIS — G4733 Obstructive sleep apnea (adult) (pediatric): Secondary | ICD-10-CM | POA: Diagnosis not present

## 2020-03-18 ENCOUNTER — Telehealth: Payer: Self-pay | Admitting: Internal Medicine

## 2020-03-18 DIAGNOSIS — G4733 Obstructive sleep apnea (adult) (pediatric): Secondary | ICD-10-CM

## 2020-03-18 NOTE — Telephone Encounter (Signed)
Spoke with pt, he was told by Huey Romans that he needed to be on his CPAP for 21 days before they can resubmit the claim for his CPAP. I have the download and will give to CY. Please review and send back to triage so we can call [employer].   Call [employer] in the morning and ask them what is needed for pt. Does he need an OV?

## 2020-03-19 NOTE — Telephone Encounter (Signed)
ATC patient unable to reach due to VM being full.

## 2020-03-19 NOTE — Telephone Encounter (Signed)
Download for 3/14- 4/12 shows 1005 compliance with excellent control. He does benefit from CPAP. Please continue CPAP auto 5-15, mask of choice, humidifier, supplies, AirView/ card

## 2020-03-20 ENCOUNTER — Telehealth: Payer: Self-pay | Admitting: Internal Medicine

## 2020-03-20 NOTE — Telephone Encounter (Signed)
LMTCB x1 for pt.  

## 2020-03-20 NOTE — Telephone Encounter (Signed)
Please refer to encounter from 4/13.

## 2020-03-20 NOTE — Telephone Encounter (Signed)
Called and spoke with pt letting him know the info stated by CY about the cpap download and pt verbalized understanding. Stated to pt that I would make sure rx info was sent to [facility] and he verbalized understanding. Order placed. Nothing further needed.

## 2020-03-27 ENCOUNTER — Other Ambulatory Visit: Payer: Self-pay | Admitting: Internal Medicine

## 2020-03-27 NOTE — Telephone Encounter (Signed)
Name of [place] Name of [place] or Written Date and Quantity:02-21-20 #60 Last Office Visit and Type: 03-04-20 Next Office Visit and Type:06-04-20 Last Controlled Substance Agreement Date:09-05-19 Last UDS:09-05-19

## 2020-04-08 ENCOUNTER — Other Ambulatory Visit: Payer: Self-pay | Admitting: Internal Medicine

## 2020-04-08 DIAGNOSIS — G4733 Obstructive sleep apnea (adult) (pediatric): Secondary | ICD-10-CM | POA: Diagnosis not present

## 2020-04-15 ENCOUNTER — Telehealth (INDEPENDENT_AMBULATORY_CARE_PROVIDER_SITE_OTHER): Payer: BC Managed Care – PPO | Admitting: Family Medicine

## 2020-04-15 ENCOUNTER — Other Ambulatory Visit: Payer: Self-pay

## 2020-04-15 ENCOUNTER — Encounter: Payer: Self-pay | Admitting: Family Medicine

## 2020-04-15 VITALS — BP 121/82 | HR 88

## 2020-04-15 DIAGNOSIS — J3089 Other allergic rhinitis: Secondary | ICD-10-CM | POA: Diagnosis not present

## 2020-04-15 DIAGNOSIS — J302 Other seasonal allergic rhinitis: Secondary | ICD-10-CM | POA: Diagnosis not present

## 2020-04-15 NOTE — Progress Notes (Signed)
Virtual Visit via Telephone Note  I connected with [name] [name] on 04/15/20 at 12:20 PM EDT by telephone and verified that I am speaking with the correct person using two identifiers.   I discussed the limitations, risks, security and privacy concerns of performing an evaluation and management service by telephone and the availability of in person appointments. I also discussed with the patient that there may be a patient responsible charge related to this service. The patient expressed understanding and agreed to proceed.  Patient location: Home Provider Location: [place] Participants: Lesleigh Noe and [name] [name]   History of Present Illness: Chief Complaint  Patient presents with  . Sore Throat    Patient states that his throat has been sore for 2 days. Previously had a runny nose and some SOB (pt states that could be from the stents put in last year). Negative for cough, fever or chills.     Sore Throat  This is a new problem. The current episode started yesterday. There has been no fever. Associated symptoms include congestion and shortness of breath. Pertinent negatives include no coughing, diarrhea, ear pain, trouble swallowing or vomiting.   Endorses pollen and allergy symptoms Stents and open heart surgery - had SOB then which improved and now comes and goes  Has not received covid vaccine No known sick contact No loss of taste/smell  Treatment: none yet  Review of Systems  Constitutional: Negative for chills and fever.  HENT: Positive for congestion. Negative for ear pain and trouble swallowing.   Eyes:       Itchy eyes  Respiratory: Positive for shortness of breath. Negative for cough.   Cardiovascular: Negative for chest pain.  Gastrointestinal: Negative for diarrhea and vomiting.      Observations/Objective: BP 121/82   Pulse 88   Phone visit:  Patient speaking in complete sentences No distress Alert and oriented Normal  mood  Assessment and Plan: Problem List Items Addressed This Visit      Respiratory   Seasonal and perennial allergic rhinitis - Primary      Suspect given other allergy symptoms this is allergy related. Advised daily allergy medication x 2 days and if no improvement would recommend covid testing to rule this out. If symptoms worsen or fever get covid testing sooner.     Follow Up Instructions:  Return if symptoms worsen or fail to improve.   I discussed the assessment and treatment plan with the patient. The patient was provided an opportunity to ask questions and all were answered. The patient agreed with the plan and demonstrated an understanding of the instructions.   The patient was advised to call back or seek an in-person evaluation if the symptoms worsen or if the condition fails to improve as anticipated.  Interactive audio and video telecommunications were attempted between this provider and patient, however failed, due to patient having technical difficulties OR patient did not have access to video capability.  We continued and completed visit with audio only.   Start Time: 12:44 End Time: 12:50   I provided 6 minutes of non-face-to-face time during this encounter.   Lesleigh Noe, MD

## 2020-04-18 ENCOUNTER — Ambulatory Visit: Payer: BC Managed Care – PPO | Attending: Internal Medicine

## 2020-04-18 DIAGNOSIS — E222 Syndrome of inappropriate secretion of antidiuretic hormone: Secondary | ICD-10-CM | POA: Diagnosis not present

## 2020-04-19 LAB — NOVEL CORONAVIRUS, NAA: SARS-CoV-2, NAA: NOT DETECTED

## 2020-04-19 LAB — SARS-COV-2, NAA 2 DAY TAT

## 2020-04-21 ENCOUNTER — Telehealth: Payer: Self-pay | Admitting: Internal Medicine

## 2020-04-21 NOTE — Telephone Encounter (Signed)
Negative COVID results given. Patient results "NOT Detected." Caller expressed understanding. ° °

## 2020-04-29 DIAGNOSIS — H40053 Ocular hypertension, bilateral: Secondary | ICD-10-CM | POA: Diagnosis not present

## 2020-04-29 DIAGNOSIS — E113293 Type 2 diabetes mellitus with mild nonproliferative diabetic retinopathy without macular edema, bilateral: Secondary | ICD-10-CM | POA: Diagnosis not present

## 2020-05-07 ENCOUNTER — Other Ambulatory Visit: Payer: Self-pay | Admitting: Internal Medicine

## 2020-05-07 NOTE — Telephone Encounter (Signed)
Name of [place] Name of Pharmacy:[facility] Last [place] or Written Date and Quantity:03-27-20#60 Last Office Visit and Type: 03-04-20 Next Office Visit and Type:06-04-20 Last Controlled Substance Agreement Date:09-05-19 Last UDS:09-05-19

## 2020-05-09 DIAGNOSIS — G4733 Obstructive sleep apnea (adult) (pediatric): Secondary | ICD-10-CM | POA: Diagnosis not present

## 2020-05-12 NOTE — Progress Notes (Signed)
Cardiology Office Note  Date:  05/13/2020   ID:  [name] [name], DOB 1951/07/13, MRN [id number]  PCP:  Venia Carbon, MD   Chief Complaint  Patient presents with  . office visit    Pt states having a sticking feeling in chest/ pt had some dizziness when bending over/ fatigue/ mild SOB/ swelling in knees. Meds verbally reviwed w/ pt.     HPI:  69 year-old gentleman with  obesity,  hyperlipidemia,  diabetes,  hypertension,  CAD, s/p CABG.  severe LAD disease, bypass surgery November 12 2011.  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[employer],  also farming work with cattle  no regular exercise program  Reports he just pulled in 130 bales of hay  with his wife,  no chest pain concerning for angina with activity  In the past has had chronic SOB SOB started after bypass surgery Stable mild symptoms No improvement in his shortness of breath after  stent placement  Occasionally with pricking in his chest, not associated with exertion Atypical in nature  Restarted on Invokana Note from primary care of patient concern of Plavix causing feeling of syndromes, no bleeding Primary care note indicating some noncompliance with medications A1c running higher 8.5 Previously 7.7 in July 2019  Lab work reviewed LDL 132 in September 2020 Previously followed by pharmacy in [place] Started on bempedoic acid, it was approved by pharmacy No recent refill listed on his medication list He preferred to be on  Repatha,  prescription sent in in December 2020  EKG personally reviewed by myself on todays visit shows normal sinus rhythm with rate 71 bpm left bundle branch block, more pronounced than on prior EKG  Prior records reviewed Cath , 1. Three-vessel coronary artery disease, including 60% ostial/proximal LMCA stenosis (MLA 5.4 mm^2 by IVUS), chronic total occlusion of the proximal LAD (known patent LIMA-LAD and SVG-D1 and D2), 90% OM1 stenosis, and 50% mid/distal RCA stenosis. 2. Widely patent proximal RCA stent. 3. Normal left ventricular filling pressure. 4. Successful IVUS-guided PCI to the ostial through mid LMCA using Resolute Onyx 3.5 12 mm drug-eluting stent (postdilated to 4.2 mm) with 0% residual stenosis and TIMI-3 flow. 5. Successful PCI to OM1 using Resolute Onyx 2.25 x 26 mm drug-eluting stent with 0% residual stenosis and TIMI-3 flow.   Other past medical history unable to tolerate a statin including Crestor, Lipitor, simvastatin., zetia Myalgias, "inflammation"  Medication intolerances  unable to tolerate metoprolol secondary to memory problems and mood disorder. Unable to tolerate carvedilol secondary to fatigue  Previous  Echocardiogram  for his shortness of breath showed normal function, no significant pulmonary hypertension. Normal right ventricular systolic pressure.  PMH:   has a past medical history of Angina, Arthritis, BPH (benign prostatic hypertrophy), Carpal tunnel syndrome, Coronary artery disease, GERD (gastroesophageal reflux disease), colonic polyps, Hyperlipidemia, Hypertension, Kidney stones, Morbid obesity ([place]), OSA (obstructive sleep apnea), and Type II diabetes mellitus ([place]).  PSH:    Past Surgical History:  Procedure Laterality Date  . CORONARY ARTERY BYPASS GRAFT  11/12/2011   Procedure:  CORONARY ARTERY BYPASS GRAFTING (CABG);  Surgeon: Grace Isaac, MD;  Location: [facility];  Service: Open Heart Surgery;  Laterality: N/A;  Coronary Artery Bypass  Graft times three on pump utilizing left internal mammary artery and right saphenous vein harvested endoscopically   . CORONARY STENT INTERVENTION N/A 09/27/2019   Procedure: CORONARY STENT INTERVENTION;  Surgeon: Wellington Hampshire, MD;  Location: [facility];  Service: Cardiovascular;  Laterality: N/A;  . CORONARY STENT INTERVENTION N/A 10/01/2019   Procedure: CORONARY STENT INTERVENTION;  Surgeon: Nelva Bush, MD;  Location: [place] [facility];  Service: Cardiovascular;  Laterality: N/A;  . HERNIA REPAIR    . INTRAVASCULAR PRESSURE WIRE/FFR STUDY N/A 09/27/2019   Procedure: INTRAVASCULAR PRESSURE WIRE/FFR STUDY;  Surgeon: Wellington Hampshire, MD;  Location: [place] [facility];  Service: Cardiovascular;  Laterality: N/A;  . INTRAVASCULAR ULTRASOUND/IVUS N/A 10/01/2019   Procedure: Intravascular Ultrasound/IVUS;  Surgeon: Nelva Bush, MD;  Location: [facility];  Service: Cardiovascular;  Laterality: N/A;  . KNEE ARTHROPLASTY Right 11/23/2017   Procedure: COMPUTER ASSISTED TOTAL KNEE ARTHROPLASTY;  Surgeon: Dereck Leep, MD;  Location: [facility];  Service: Orthopedics;  Laterality: Right;  . KNEE SURGERY     left  . LEFT HEART CATH AND CORONARY ANGIOGRAPHY Left 09/27/2019   Procedure: LEFT HEART CATH AND CORONARY ANGIOGRAPHY;  Surgeon: Minna Merritts, MD;  Location: [place] [facility];  Service: Cardiovascular;  Laterality: Left;  . LEFT HEART CATH AND CORONARY ANGIOGRAPHY N/A 10/01/2019   Procedure: LEFT HEART CATH AND CORONARY ANGIOGRAPHY;  Surgeon: Nelva Bush, MD;  Location: [place] [facility];  Service: Cardiovascular;  Laterality: N/A;  . MENISCUS REPAIR  03/08   right knee    Current Outpatient Medications  Medication Sig Dispense Refill  . albuterol (VENTOLIN HFA) 108 (90 Base) MCG/ACT inhaler Inhale 2 puffs into the lungs every 6 (six) hours as needed for wheezing or shortness of breath. 90 g 0  . aspirin EC 81 MG tablet Take 1 tablet (81 mg  total) by mouth daily. 90 tablet 3  . Bempedoic Acid 180 MG TABS Take 180 mg by mouth daily. 14 tablet 0  . clopidogrel (PLAVIX) 75 MG tablet Take 1 tablet (75 mg total) by mouth daily with breakfast. 90 tablet 2  . Evolocumab with Infusor ([place]) 420 MG/3.5ML SOCT Inject 1 applicator into the skin every 30 (thirty) days. 3.6 mL 11  . furosemide (LASIX) 40 MG tablet TAKE 1 TABLET DAILY AS NEEDED 90 tablet 2  . glucose blood (FREESTYLE LITE) test strip Use as instructed, patient tests once daily. Dx: 250.00 300 each 3  . HYDROcodone-acetaminophen (NORCO/VICODIN) 5-325 MG tablet TAKE 1 TABLET BY MOUTH TWICE (2) DAILY 60 tablet 0  . INVOKANA 300 MG TABS tablet TAKE 1 TABLET DAILY BEFORE BREAKFAST 90 tablet 3  . lisinopril (ZESTRIL) 20 MG tablet Take 1 tablet (20 mg total) by mouth daily. 90 tablet 3  . metFORMIN (GLUCOPHAGE) 1000 MG tablet TAKE 1 TABLET BY MOUTH TWICE (2) DAILY WITH A MEAL 180 tablet 3  . nitroGLYCERIN (NITROSTAT) 0.4 MG SL tablet Place 1 tablet (0.4 mg total) under the tongue every 5 (five) minutes as needed for chest pain. 25 tablet 3  . potassium chloride SA (K-DUR,KLOR-CON) 20 MEQ tablet Take 1 tablet (20 mEq total) by mouth 2 (two) times daily as needed (Take with lasix). 90 tablet 3  . tamsulosin (FLOMAX) 0.4 MG CAPS capsule Take 1 capsule (0.4 mg total)  by mouth daily. 90 capsule 3  . triamcinolone cream (KENALOG) 0.1 % Apply 1 application topically 2 (two) times daily as needed. (Patient taking differently: Apply 1 application topically 2 (two) times daily as needed (irritation). ) 30 g 0   No current facility-administered medications for this visit.     Allergies:   Doxazosin mesylate, Tetracyclines & related, Carvedilol, Crestor [rosuvastatin], Doxycycline, Glimepiride, Glipizide, Losartan potassium-hctz, Metoprolol tartrate, Pravastatin, Rosiglitazone maleate, and Simvastatin   Social History:  The patient  reports that he has never smoked. He has  never used smokeless tobacco. He reports that he does not drink alcohol or use drugs.   Family History:   family history includes Coronary artery disease in his father; Prostate cancer in his father.    Review of Systems: Review of Systems  Constitutional: Negative.   HENT: Negative.   Respiratory: Positive for shortness of breath.   Cardiovascular: Negative.   Gastrointestinal: Negative.   Musculoskeletal: Positive for joint pain.  Neurological: Negative.   Psychiatric/Behavioral: Negative.   All other systems reviewed and are negative.   PHYSICAL EXAM: VS:  BP 120/60 (BP Location: Left Arm, Patient Position: Sitting, Cuff Size: Normal)   Pulse 71   Ht 5\' 5"  (1.651 m)   Wt 203 lb (92.1 kg)   SpO2 97%   BMI 33.78 kg/m  , BMI Body mass index is 33.78 kg/m.  Constitutional:  oriented to person, place, and time. No distress.  HENT:  Head: Grossly normal Eyes:  no discharge. No scleral icterus.  Neck: No JVD, no carotid bruits  Cardiovascular: Regular rate and rhythm, no murmurs appreciated Pulmonary/Chest: Clear to auscultation bilaterally, no wheezes or rails Abdominal: Soft.  no distension.  no tenderness.  Musculoskeletal: Normal range of motion Neurological:  normal muscle tone. Coordination normal. No atrophy Skin: Skin warm and dry Psychiatric: normal affect, pleasant  Recent Labs: 09/05/2019: ALT 15 10/09/2019: BUN 18; Creatinine, Ser 1.05; Hemoglobin 15.5; Platelets 260; Potassium 4.5; Sodium 137    Lipid Panel Lab Results  Component Value Date   CHOL 217 (H) 09/05/2019   HDL 45.30 09/05/2019   LDLCALC 130 (H) 03/20/2018   TRIG 223.0 (H) 09/05/2019      Wt Readings from Last 3 Encounters:  05/13/20 203 lb (92.1 kg)  03/04/20 207 lb (93.9 kg)  12/03/19 206 lb 6.4 oz (93.6 kg)     ASSESSMENT AND PLAN:  Essential hypertension - Plan: EKG 12-Lead Well-controlled, no medication changes made  Coronary artery disease involving native coronary artery of  native heart without angina pectoris -  Prior stent placement, no improvement in his shortness of breath Tolerating Repatha Order lipids today Currently with no symptoms of angina. No further workup at this time. Continue current medication regimen.  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    Mila Merry, M.D., Ph.D. 05/13/2020  Larence Penning  [place], [place]  \

## 2020-05-13 ENCOUNTER — Other Ambulatory Visit: Payer: Self-pay

## 2020-05-13 ENCOUNTER — Ambulatory Visit (INDEPENDENT_AMBULATORY_CARE_PROVIDER_SITE_OTHER): Payer: BC Managed Care – PPO | Admitting: Cardiovascular Disease

## 2020-05-13 ENCOUNTER — Encounter: Payer: Self-pay | Admitting: Cardiovascular Disease

## 2020-05-13 VITALS — BP 120/60 | HR 71 | Ht 65.0 in | Wt 203.0 lb

## 2020-05-13 DIAGNOSIS — R0600 Dyspnea, unspecified: Secondary | ICD-10-CM

## 2020-05-13 DIAGNOSIS — I10 Essential (primary) hypertension: Secondary | ICD-10-CM

## 2020-05-13 DIAGNOSIS — Z9989 Dependence on other enabling machines and devices: Secondary | ICD-10-CM

## 2020-05-13 DIAGNOSIS — E785 Hyperlipidemia, unspecified: Secondary | ICD-10-CM | POA: Diagnosis not present

## 2020-05-13 DIAGNOSIS — I25118 Atherosclerotic heart disease of native coronary artery with other forms of angina pectoris: Secondary | ICD-10-CM | POA: Diagnosis not present

## 2020-05-13 DIAGNOSIS — G4733 Obstructive sleep apnea (adult) (pediatric): Secondary | ICD-10-CM

## 2020-05-13 DIAGNOSIS — E118 Type 2 diabetes mellitus with unspecified complications: Secondary | ICD-10-CM

## 2020-05-13 DIAGNOSIS — Z951 Presence of aortocoronary bypass graft: Secondary | ICD-10-CM

## 2020-05-13 DIAGNOSIS — E1165 Type 2 diabetes mellitus with hyperglycemia: Secondary | ICD-10-CM

## 2020-05-13 DIAGNOSIS — IMO0002 Reserved for concepts with insufficient information to code with codable children: Secondary | ICD-10-CM

## 2020-05-13 NOTE — Patient Instructions (Addendum)
Labs: Lipids and LFTs today   Medication Instructions:  No changes  If you need a refill on your cardiac medications before your next appointment, please call your pharmacy.    Lab work: No new labs needed   If you have labs (blood work) drawn today and your tests are completely normal, you will receive your results only by:  [place] (if you have MyChart) OR  A paper copy in the mail If you have any lab test that is abnormal or we need to change your treatment, we will call you to review the results.   Testing/Procedures: No new testing needed   Follow-Up: At [facility], you and your health needs are our priority.  As part of our continuing mission to provide you with exceptional heart care, we have created designated Provider Care Teams.  These Care Teams include your primary Cardiologist (physician) and Advanced Practice Providers (APPs -  Physician Assistants and Nurse Practitioners) who all work together to provide you with the care you need, when you need it.   You will need a follow up appointment in 6 months    Providers on your designated Care Team:    Murray Hodgkins, NP  Christell Faith, PA-C  Marrianne Mood, PA-C  Any Other Special Instructions Will Be Listed Below (If Applicable).  For educational health videos Log in to : [email or web address] Or : [email or web address], password : [email or web address]

## 2020-05-14 LAB — HEPATIC FUNCTION PANEL
ALT: 13 IU/L (ref 0–44)
AST: 15 IU/L (ref 0–40)
Albumin: 4.5 g/dL (ref 3.8–4.8)
Alkaline Phosphatase: 106 IU/L (ref 48–121)
Bilirubin Total: 0.3 mg/dL (ref 0.0–1.2)
Bilirubin, Direct: 0.11 mg/dL (ref 0.00–0.40)
Total Protein: 6.7 g/dL (ref 6.0–8.5)

## 2020-05-14 LAB — LIPID PANEL
Chol/HDL Ratio: 3.3 ratio (ref 0.0–5.0)
Cholesterol, Total: 114 mg/dL (ref 100–199)
HDL: 35 mg/dL — ABNORMAL LOW (ref >39)
LDL Chol Calc (NIH): 48 mg/dL (ref 0–99)
Triglycerides: 192 mg/dL — ABNORMAL HIGH (ref 0–149)
VLDL Cholesterol Cal: 31 mg/dL (ref 5–40)

## 2020-05-21 ENCOUNTER — Telehealth: Payer: Self-pay

## 2020-05-21 NOTE — Telephone Encounter (Signed)
Called and spoke w/pa rep to start for repatha pushtronix by phone. Waiting for a determination.  Case number [id number]

## 2020-05-28 DIAGNOSIS — M9904 Segmental and somatic dysfunction of sacral region: Secondary | ICD-10-CM | POA: Diagnosis not present

## 2020-05-28 DIAGNOSIS — M5416 Radiculopathy, lumbar region: Secondary | ICD-10-CM | POA: Diagnosis not present

## 2020-05-28 DIAGNOSIS — M5418 Radiculopathy, sacral and sacrococcygeal region: Secondary | ICD-10-CM | POA: Diagnosis not present

## 2020-05-28 DIAGNOSIS — M9903 Segmental and somatic dysfunction of lumbar region: Secondary | ICD-10-CM | POA: Diagnosis not present

## 2020-05-30 ENCOUNTER — Telehealth: Payer: Self-pay | Admitting: Cardiovascular Disease

## 2020-05-30 DIAGNOSIS — M9903 Segmental and somatic dysfunction of lumbar region: Secondary | ICD-10-CM | POA: Diagnosis not present

## 2020-05-30 DIAGNOSIS — M5418 Radiculopathy, sacral and sacrococcygeal region: Secondary | ICD-10-CM | POA: Diagnosis not present

## 2020-05-30 DIAGNOSIS — M5416 Radiculopathy, lumbar region: Secondary | ICD-10-CM | POA: Diagnosis not present

## 2020-05-30 DIAGNOSIS — M9904 Segmental and somatic dysfunction of sacral region: Secondary | ICD-10-CM | POA: Diagnosis not present

## 2020-05-30 NOTE — Telephone Encounter (Signed)
Patient calling would like to check the status of lab results  Unable to view on mychart

## 2020-05-30 NOTE — Telephone Encounter (Signed)
Spoke with the patient. Patient sts that he has been unable to view his recent lipid and lft results in mychart.  Reviewed the patient lab results and Dr. Donivan Scull ecommendation.  Minna Merritts, MD  05/18/2020 6:11 PM EDT     Outstanding cholesterol level Beautifully done, would not change a thing      Patient verbalized understanding and voiced appreciation for the call back.

## 2020-06-03 DIAGNOSIS — M9903 Segmental and somatic dysfunction of lumbar region: Secondary | ICD-10-CM | POA: Diagnosis not present

## 2020-06-03 DIAGNOSIS — M9904 Segmental and somatic dysfunction of sacral region: Secondary | ICD-10-CM | POA: Diagnosis not present

## 2020-06-03 DIAGNOSIS — M5418 Radiculopathy, sacral and sacrococcygeal region: Secondary | ICD-10-CM | POA: Diagnosis not present

## 2020-06-03 DIAGNOSIS — M5416 Radiculopathy, lumbar region: Secondary | ICD-10-CM | POA: Diagnosis not present

## 2020-06-04 ENCOUNTER — Encounter: Payer: Self-pay | Admitting: Internal Medicine

## 2020-06-04 ENCOUNTER — Ambulatory Visit (INDEPENDENT_AMBULATORY_CARE_PROVIDER_SITE_OTHER): Payer: BC Managed Care – PPO | Admitting: Internal Medicine

## 2020-06-04 ENCOUNTER — Other Ambulatory Visit: Payer: Self-pay

## 2020-06-04 VITALS — BP 110/60 | HR 84 | Ht 65.0 in | Wt 199.0 lb

## 2020-06-04 DIAGNOSIS — E1159 Type 2 diabetes mellitus with other circulatory complications: Secondary | ICD-10-CM | POA: Diagnosis not present

## 2020-06-04 DIAGNOSIS — G894 Chronic pain syndrome: Secondary | ICD-10-CM | POA: Diagnosis not present

## 2020-06-04 DIAGNOSIS — I25118 Atherosclerotic heart disease of native coronary artery with other forms of angina pectoris: Secondary | ICD-10-CM

## 2020-06-04 DIAGNOSIS — F1120 Opioid dependence, uncomplicated: Secondary | ICD-10-CM

## 2020-06-04 LAB — POCT GLYCOSYLATED HEMOGLOBIN (HGB A1C): Hemoglobin A1C: 8.1 % — AB (ref 4.0–5.6)

## 2020-06-04 LAB — HM DIABETES FOOT EXAM

## 2020-06-04 NOTE — Progress Notes (Signed)
Subjective:    Patient ID: [name] [name], male    DOB: 11-Dec-1950, 69 y.o.   MRN: [id number]  HPI Here for follow up of chronic pain and diabetes This visit occurred during the SARS-CoV-2 public health emergency.  Safety protocols were in place, including screening questions prior to the visit, additional usage of staff PPE, and extensive cleaning of exam room while observing appropriate contact time as indicated for disinfecting solutions.   Has started with chiropractor for his back This has helped Using the hydrocodone now more for his shoulder---keeping under his limit  Has been working on eating Has lost some weight Checking sugars occasionally---still 140-150's generally Taking invokana daily  Current Outpatient Medications on File Prior to Visit  Medication Sig Dispense Refill  . albuterol (VENTOLIN HFA) 108 (90 Base) MCG/ACT inhaler Inhale 2 puffs into the lungs every 6 (six) hours as needed for wheezing or shortness of breath. 90 g 0  . aspirin EC 81 MG tablet Take 1 tablet (81 mg total) by mouth daily. 90 tablet 3  . Bempedoic Acid 180 MG TABS Take 180 mg by mouth daily. 14 tablet 0  . clopidogrel (PLAVIX) 75 MG tablet Take 1 tablet (75 mg total) by mouth daily with breakfast. 90 tablet 2  . Evolocumab with Infusor ([place]) 420 MG/3.5ML SOCT Inject 1 applicator into the skin every 30 (thirty) days. 3.6 mL 11  . furosemide (LASIX) 40 MG tablet TAKE 1 TABLET DAILY AS NEEDED 90 tablet 2  . glucose blood (FREESTYLE LITE) test strip Use as instructed, patient tests once daily. Dx: 250.00 300 each 3  . HYDROcodone-acetaminophen (NORCO/VICODIN) 5-325 MG tablet TAKE 1 TABLET BY MOUTH TWICE (2) DAILY 60 tablet 0  . INVOKANA 300 MG TABS tablet TAKE 1 TABLET DAILY BEFORE BREAKFAST 90 tablet 3  . lisinopril (ZESTRIL) 20 MG tablet Take 1 tablet (20 mg total) by mouth daily. 90 tablet 3  . metFORMIN (GLUCOPHAGE) 1000 MG tablet TAKE 1 TABLET BY MOUTH TWICE (2)  DAILY WITH A MEAL 180 tablet 3  . nitroGLYCERIN (NITROSTAT) 0.4 MG SL tablet Place 1 tablet (0.4 mg total) under the tongue every 5 (five) minutes as needed for chest pain. 25 tablet 3  . potassium chloride SA (K-DUR,KLOR-CON) 20 MEQ tablet Take 1 tablet (20 mEq total) by mouth 2 (two) times daily as needed (Take with lasix). 90 tablet 3  . tamsulosin (FLOMAX) 0.4 MG CAPS capsule Take 1 capsule (0.4 mg total) by mouth daily. 90 capsule 3  . triamcinolone cream (KENALOG) 0.1 % Apply 1 application topically 2 (two) times daily as needed. (Patient taking differently: Apply 1 application topically 2 (two) times daily as needed (irritation). ) 30 g 0   No current facility-administered medications on file prior to visit.    Allergies  Allergen Reactions  . Doxazosin Mesylate Other (See Comments)    REACTION: didn't work and may have caused SOB  . Tetracyclines & Related   . Carvedilol Other (See Comments)    Felt bad  . Crestor [Rosuvastatin] Other (See Comments)    Muscle aches.  . Doxycycline Other (See Comments)    REACTION: unspecified  . Glimepiride Other (See Comments)    REACTION: red eyes  . Glipizide Other (See Comments)    REACTION: myalgia??  . Losartan Potassium-Hctz Other (See Comments)    REACTION: chest \\T \ leg pain  . Metoprolol Tartrate Other (See Comments)    Mood swings, memory changes  . Pravastatin Other (See Comments)  Muscle aches   . Rosiglitazone Maleate Other (See Comments)    REACTION: myalgia  . Simvastatin Other (See Comments)    REACTION: myalgias    Past Medical History:  Diagnosis Date  . Angina   . Arthritis   . BPH (benign prostatic hypertrophy)   . Carpal tunnel syndrome   . Coronary artery disease    a. 11/2011 s/p CABG x 3  (LIMA-LAD, SeqSVG-D1-D3); b. 09/2019 CATH-RCA PCI: LM 50ost, LAD 100ost/p, Sev diff diag dzs, LCX nl, OM1 74m, OM3 40, RCA 70ost (iFR 0.88--> 3.0x26 Resolute Onyx DES), 29m, LIMA->LAD ok. VG->D1->D3 ok. EF 50-55%.; c)  10/01/2019: staged PCI Ost LM (Resolute Onyx 3.5 x 12 - 4.2 mm) & Ost OM1 (Resolute Onyx 2.25 x 26 -- 2.5 mm)  . GERD (gastroesophageal reflux disease)   . Hx of colonic polyps   . Hyperlipidemia   . Hypertension   . Kidney stones   . Morbid obesity ([place])   . OSA (obstructive sleep apnea)    uses cpap  . Type II diabetes mellitus ([place])     Past Surgical History:  Procedure Laterality Date  . CORONARY ARTERY BYPASS GRAFT  11/12/2011   Procedure: CORONARY ARTERY BYPASS GRAFTING (CABG);  Surgeon: Grace Isaac, MD;  Location: [facility];  Service: Open Heart Surgery;  Laterality: N/A;  Coronary Artery Bypass Graft times three on pump utilizing left internal mammary artery and right saphenous vein harvested endoscopically   . CORONARY STENT INTERVENTION N/A 09/27/2019   Procedure: CORONARY STENT INTERVENTION;  Surgeon: Wellington Hampshire, MD;  Location: [place] [facility];  Service: Cardiovascular;  Laterality: N/A;  . CORONARY STENT INTERVENTION N/A 10/01/2019   Procedure: CORONARY STENT INTERVENTION;  Surgeon: Nelva Bush, MD;  Location: [facility];  Service: Cardiovascular;  Laterality: N/A;  . HERNIA REPAIR    . INTRAVASCULAR PRESSURE WIRE/FFR STUDY N/A 09/27/2019   Procedure: INTRAVASCULAR PRESSURE WIRE/FFR STUDY;  Surgeon: Wellington Hampshire, MD;  Location: [facility];  Service: Cardiovascular;  Laterality: N/A;  . INTRAVASCULAR ULTRASOUND/IVUS N/A 10/01/2019   Procedure: Intravascular Ultrasound/IVUS;  Surgeon: Nelva Bush, MD;  Location: [facility];  Service: Cardiovascular;  Laterality: N/A;  . KNEE ARTHROPLASTY Right 11/23/2017   Procedure: COMPUTER ASSISTED TOTAL KNEE ARTHROPLASTY;  Surgeon: Dereck Leep, MD;  Location: [facility];  Service: Orthopedics;  Laterality: Right;  . KNEE SURGERY     left  . LEFT HEART CATH AND CORONARY ANGIOGRAPHY Left 09/27/2019   Procedure: LEFT HEART CATH AND CORONARY ANGIOGRAPHY;  Surgeon: Minna Merritts, MD;   Location: [place];  Service: Cardiovascular;  Laterality: Left;  . LEFT HEART CATH AND CORONARY ANGIOGRAPHY N/A 10/01/2019   Procedure: LEFT HEART CATH AND CORONARY ANGIOGRAPHY;  Surgeon: Nelva Bush, MD;  Location: [place] [facility];  Service: Cardiovascular;  Laterality: N/A;  . MENISCUS REPAIR  03/08   right knee    Family History  Problem Relation Age of Onset  . Coronary artery disease Father   . Prostate cancer Father   . Diabetes Neg Hx   . Hypertension Neg Hx     Social History   Socioeconomic History  . Marital status: Married    Spouse name: Not on file  . Number of children: 3  . Years of education: Not on file  . Highest education level: Not on file  Occupational History  . Occupation: Surveyor, quantity  Tobacco Use  . Smoking status: Never Smoker  . Smokeless  tobacco: Never Used  Vaping Use  . Vaping Use: Never used  Substance and Sexual Activity  . Alcohol use: No  . Drug use: No  . Sexual activity: Yes  Other Topics Concern  . Not on file  Social History Narrative   No living will   Requests wife as health care POA   Would accept resuscitation   Would accept tube feedings   Social Determinants of Health   Financial Resource Strain:   . Difficulty of Paying Living Expenses:   Food Insecurity:   . Worried About Charity fundraiser in the Last Year:   . Arboriculturist in the Last Year:   Transportation Needs:   . Film/video editor (Medical):   Marland Kitchen Lack of Transportation (Non-Medical):   Physical Activity:   . Days of Exercise per Week:   . Minutes of Exercise per Session:   Stress:   . Feeling of Stress :   Social Connections:   . Frequency of Communication with Friends and Family:   . Frequency of Social Gatherings with Friends and Family:   . Attends Religious Services:   . Active Member of Clubs or Organizations:   . Attends Archivist Meetings:   Marland Kitchen Marital Status:   Intimate Partner Violence:   . Fear of  Current or Ex-Partner:   . Emotionally Abused:   Marland Kitchen Physically Abused:   . 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    Appearance: Normal appearance.  HENT:     Head: Normocephalic and atraumatic.  Cardiovascular:     Rate and Rhythm: Normal rate and regular rhythm.     Pulses: Normal pulses.     Heart sounds: No murmur heard.  No gallop.   Pulmonary:     Effort: Pulmonary effort is normal.     Breath sounds: Normal breath sounds. No wheezing or rales.  Musculoskeletal:     Right lower leg: No edema.     Left lower leg: No edema.  Lymphadenopathy:     Cervical: No cervical adenopathy.  Skin:    Comments: No foot lesions  Neurological:     Comments: Normal sensation in feet  Psychiatric:        Mood and Affect: Mood normal.        Behavior: Behavior normal.            Assessment & Plan:

## 2020-06-04 NOTE — Assessment & Plan Note (Signed)
PDMP reviewed No concerns 

## 2020-06-04 NOTE — Patient Instructions (Signed)
You can try over the counter voltaren (diclofenac) gel on your shoulder (like 3 times a day) to see if that helps.

## 2020-06-04 NOTE — Assessment & Plan Note (Addendum)
Hopefully better with improved lifestyle On canaglifloxin and metformin Didn't tolerate glipizide No affordable additional alternatives--would probably need to have insulin  Lab Results  Component Value Date   HGBA1C 8.1 (A) 06/04/2020   This is better Will hold off on new medication

## 2020-06-04 NOTE — Assessment & Plan Note (Signed)
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

## 2020-06-08 DIAGNOSIS — G4733 Obstructive sleep apnea (adult) (pediatric): Secondary | ICD-10-CM | POA: Diagnosis not present

## 2020-06-13 ENCOUNTER — Other Ambulatory Visit: Payer: Self-pay | Admitting: Internal Medicine

## 2020-06-16 NOTE — Telephone Encounter (Signed)
Name of [place] Name of Pharmacy:[facility] Last [place] or Written Date and Quantity:05-07-20#60 Last Office Visit and Type:06-04-20 Next Office Visit and Type:09-05-20 Last Controlled Substance Agreement Date:09-05-19 Last UDS:09-05-19

## 2020-07-14 DIAGNOSIS — M25511 Pain in right shoulder: Secondary | ICD-10-CM | POA: Diagnosis not present

## 2020-07-14 DIAGNOSIS — M25512 Pain in left shoulder: Secondary | ICD-10-CM | POA: Diagnosis not present

## 2020-07-29 ENCOUNTER — Other Ambulatory Visit: Payer: Self-pay | Admitting: Internal Medicine

## 2020-07-29 NOTE — Telephone Encounter (Signed)
Last OV--06/04/20 Last refill--06/16/20  Requesting Rx Hydrocodone 325 mg--please advise

## 2020-08-15 ENCOUNTER — Other Ambulatory Visit: Payer: Self-pay | Admitting: Physician Assistant

## 2020-08-19 ENCOUNTER — Other Ambulatory Visit: Payer: Self-pay | Admitting: Physician Assistant

## 2020-09-01 DIAGNOSIS — M7542 Impingement syndrome of left shoulder: Secondary | ICD-10-CM | POA: Diagnosis not present

## 2020-09-01 DIAGNOSIS — M7541 Impingement syndrome of right shoulder: Secondary | ICD-10-CM | POA: Diagnosis not present

## 2020-09-02 DIAGNOSIS — M9903 Segmental and somatic dysfunction of lumbar region: Secondary | ICD-10-CM | POA: Diagnosis not present

## 2020-09-02 DIAGNOSIS — M9904 Segmental and somatic dysfunction of sacral region: Secondary | ICD-10-CM | POA: Diagnosis not present

## 2020-09-02 DIAGNOSIS — M5416 Radiculopathy, lumbar region: Secondary | ICD-10-CM | POA: Diagnosis not present

## 2020-09-02 DIAGNOSIS — M5418 Radiculopathy, sacral and sacrococcygeal region: Secondary | ICD-10-CM | POA: Diagnosis not present

## 2020-09-04 DIAGNOSIS — M9904 Segmental and somatic dysfunction of sacral region: Secondary | ICD-10-CM | POA: Diagnosis not present

## 2020-09-04 DIAGNOSIS — M5416 Radiculopathy, lumbar region: Secondary | ICD-10-CM | POA: Diagnosis not present

## 2020-09-04 DIAGNOSIS — M5418 Radiculopathy, sacral and sacrococcygeal region: Secondary | ICD-10-CM | POA: Diagnosis not present

## 2020-09-04 DIAGNOSIS — M9903 Segmental and somatic dysfunction of lumbar region: Secondary | ICD-10-CM | POA: Diagnosis not present

## 2020-09-05 ENCOUNTER — Other Ambulatory Visit: Payer: Self-pay

## 2020-09-05 ENCOUNTER — Ambulatory Visit (INDEPENDENT_AMBULATORY_CARE_PROVIDER_SITE_OTHER): Payer: BC Managed Care – PPO | Admitting: Internal Medicine

## 2020-09-05 ENCOUNTER — Encounter: Payer: Self-pay | Admitting: Internal Medicine

## 2020-09-05 VITALS — BP 112/70 | HR 71 | Temp 96.8°F | Ht 65.0 in | Wt 206.0 lb

## 2020-09-05 DIAGNOSIS — G894 Chronic pain syndrome: Secondary | ICD-10-CM

## 2020-09-05 DIAGNOSIS — F1120 Opioid dependence, uncomplicated: Secondary | ICD-10-CM | POA: Diagnosis not present

## 2020-09-05 DIAGNOSIS — I25118 Atherosclerotic heart disease of native coronary artery with other forms of angina pectoris: Secondary | ICD-10-CM

## 2020-09-05 NOTE — Assessment & Plan Note (Signed)
Back and shoulders mostly Cortisone shots in shoulders have helped Chiropractor for back as needed Uses the hydrocodone at least once a day

## 2020-09-05 NOTE — Progress Notes (Signed)
Subjective:    Patient ID: [name] [name], male    DOB: 03/01/1951, 69 y.o.   MRN: [id number]  HPI Here for follow up of chronic pain and narcotic dependence This visit occurred during the SARS-CoV-2 public health emergency.  Safety protocols were in place, including screening questions prior to the visit, additional usage of staff PPE, and extensive cleaning of exam room while observing appropriate contact time as indicated for disinfecting solutions.   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 These have helped Uses the hydrocodone daily and bid if needed (after riding tractor) Plans to retire next month  Checks sugars at times--- usually 140  Current Outpatient Medications on File Prior to Visit  Medication Sig Dispense Refill   albuterol (VENTOLIN HFA) 108 (90 Base) MCG/ACT inhaler Inhale 2 puffs into the lungs every 6 (six) hours as needed for wheezing or shortness of breath. 90 g 0   Ascorbic Acid (VITAMIN C) 1000 MG tablet Take 1,000 mg by mouth daily.     aspirin EC 81 MG tablet Take 1 tablet (81 mg total) by mouth daily. 90 tablet 3   Bempedoic Acid 180 MG TABS Take 180 mg by mouth daily. 14 tablet 0   clopidogrel (PLAVIX) 75 MG tablet TAKE 1 TABLET DAILY WITH BREAKFAST 90 tablet 0   Cyanocobalamin (VITAMIN B-12) 3000 MCG SUBL Place under the tongue.     Evolocumab with Infusor ([place]) 420 MG/3.5ML SOCT Inject 1 applicator into the skin every 30 (thirty) days. 3.6 mL 11   furosemide (LASIX) 40 MG tablet TAKE 1 TABLET DAILY AS NEEDED 90 tablet 2   glucose blood (FREESTYLE LITE) test strip Use as instructed, patient tests once daily. Dx: 250.00 300 each 3   HYDROcodone-acetaminophen (NORCO/VICODIN) 5-325 MG tablet TAKE 1 TABLET BY MOUTH TWICE (2) DAILY 60 tablet 0   INVOKANA 300 MG TABS tablet TAKE 1 TABLET  DAILY BEFORE BREAKFAST 90 tablet 3   lisinopril (ZESTRIL) 20 MG tablet TAKE 1 TABLET DAILY 90 tablet 0   metFORMIN (GLUCOPHAGE) 1000 MG tablet TAKE 1 TABLET BY MOUTH TWICE (2) DAILY WITH A MEAL 180 tablet 3   nitroGLYCERIN (NITROSTAT) 0.4 MG SL tablet Place 1 tablet (0.4 mg total) under the tongue every 5 (five) minutes as needed for chest pain. 25 tablet 3   potassium chloride SA (K-DUR,KLOR-CON) 20 MEQ tablet Take 1 tablet (20 mEq total) by mouth 2 (two) times daily as needed (Take with lasix). 90 tablet 3   tamsulosin (FLOMAX) 0.4 MG CAPS capsule Take 1 capsule (0.4 mg total) by mouth daily. 90 capsule 3   triamcinolone cream (KENALOG) 0.1 % Apply 1 application topically 2 (two) times daily as needed. (Patient taking differently: Apply 1 application topically 2 (two) times daily as needed (irritation). ) 30 g 0   Turmeric 500 MG TABS Take by mouth.     Zinc 30 MG TABS Take by mouth.     No current facility-administered medications on file prior to visit.    Allergies  Allergen Reactions   Doxazosin Mesylate Other (See Comments)    REACTION: didn't work and may have caused SOB   Tetracyclines & Related    Carvedilol Other (See Comments)    Felt bad   Crestor [Rosuvastatin] Other (See Comments)    Muscle aches.   Doxycycline Other (See Comments)    REACTION: unspecified  Glimepiride Other (See Comments)    REACTION: red eyes   Glipizide Other (See Comments)    REACTION: myalgia??   Losartan Potassium-Hctz Other (See Comments)    REACTION: chest \\T \ leg pain   Metoprolol Tartrate Other (See Comments)    Mood swings, memory changes   Pravastatin Other (See Comments)    Muscle aches    Rosiglitazone Maleate Other (See Comments)    REACTION: myalgia   Simvastatin Other (See Comments)    REACTION: myalgias    Past Medical History:  Diagnosis Date   Angina    Arthritis    BPH (benign prostatic hypertrophy)    Carpal tunnel syndrome    Coronary  artery disease    a. 11/2011 s/p CABG x 3  (LIMA-LAD, SeqSVG-D1-D3); b. 09/2019 CATH-RCA PCI: LM 50ost, LAD 100ost/p, Sev diff diag dzs, LCX nl, OM1 81m, OM3 40, RCA 70ost (iFR 0.88--> 3.0x26 Resolute Onyx DES), 70m, LIMA->LAD ok. VG->D1->D3 ok. EF 50-55%.; c) 10/01/2019: staged PCI Ost LM (Resolute Onyx 3.5 x 12 - 4.2 mm) & Ost OM1 (Resolute Onyx 2.25 x 26 -- 2.5 mm)   GERD (gastroesophageal reflux disease)    Hx of colonic polyps    Hyperlipidemia    Hypertension    Kidney stones    Morbid obesity (HCC)    OSA (obstructive sleep apnea)    uses cpap   Type II diabetes mellitus ([place])     Past Surgical History:  Procedure Laterality Date   CORONARY ARTERY BYPASS GRAFT  11/12/2011   Procedure: CORONARY ARTERY BYPASS GRAFTING (CABG);  Surgeon: Grace Isaac, MD;  Location: [facility];  Service: Open Heart Surgery;  Laterality: N/A;  Coronary Artery Bypass Graft times three on pump utilizing left internal mammary artery and right saphenous vein harvested endoscopically    CORONARY STENT INTERVENTION N/A 09/27/2019   Procedure: CORONARY STENT INTERVENTION;  Surgeon: Wellington Hampshire, MD;  Location: [facility];  Service: Cardiovascular;  Laterality: N/A;   CORONARY STENT INTERVENTION N/A 10/01/2019   Procedure: CORONARY STENT INTERVENTION;  Surgeon: Nelva Bush, MD;  Location: [facility];  Service: Cardiovascular;  Laterality: N/A;   HERNIA REPAIR     INTRAVASCULAR PRESSURE WIRE/FFR STUDY N/A 09/27/2019   Procedure: INTRAVASCULAR PRESSURE WIRE/FFR STUDY;  Surgeon: Wellington Hampshire, MD;  Location: [facility];  Service: Cardiovascular;  Laterality: N/A;   INTRAVASCULAR ULTRASOUND/IVUS N/A 10/01/2019   Procedure: Intravascular Ultrasound/IVUS;  Surgeon: Nelva Bush, MD;  Location: [facility];  Service: Cardiovascular;  Laterality: N/A;   KNEE ARTHROPLASTY Right 11/23/2017   Procedure: COMPUTER ASSISTED TOTAL KNEE ARTHROPLASTY;  Surgeon:  Dereck Leep, MD;  Location: [facility];  Service: Orthopedics;  Laterality: Right;   KNEE SURGERY     left   LEFT HEART CATH AND CORONARY ANGIOGRAPHY Left 09/27/2019   Procedure: LEFT HEART CATH AND CORONARY ANGIOGRAPHY;  Surgeon: Minna Merritts, MD;  Location: [facility];  Service: Cardiovascular;  Laterality: Left;   LEFT HEART CATH AND CORONARY ANGIOGRAPHY N/A 10/01/2019   Procedure: LEFT HEART CATH AND CORONARY ANGIOGRAPHY;  Surgeon: Nelva Bush, MD;  Location: [facility];  Service: Cardiovascular;  Laterality: N/A;   MENISCUS REPAIR  03/08   right knee    Family History  Problem Relation Age of Onset   Coronary artery disease Father    Prostate cancer Father    Diabetes Neg Hx    Hypertension Neg Hx     Social History  Socioeconomic History   Marital status: Married    Spouse name: Not on file   Number of children: 3   Years of education: Not on file   Highest education level: Not on file  Occupational History   Occupation: Surveyor, quantity  Tobacco Use   Smoking status: Never Smoker   Smokeless tobacco: Never Used  Vaping Use   Vaping Use: Never used  Substance and Sexual Activity   Alcohol use: No   Drug use: No   Sexual activity: Yes  Other Topics Concern   Not on file  Social History Narrative   No living will   Requests wife as health care POA   Would accept resuscitation   Would accept tube feedings   Social Determinants of Health   Financial Resource Strain:    Difficulty of Paying Living Expenses: Not on file  Food Insecurity:    Worried About [place] in the Last Year: Not on file   Ran Out of Food in the Last Year: Not on file  Transportation Needs:    Lack of Transportation (Medical): Not on file   Lack of Transportation (Non-Medical): Not on file  Physical Activity:    Days of Exercise per Week: Not on file   Minutes of Exercise per Session: Not on file  Stress:    Feeling of  Stress : Not on file  Social Connections:    Frequency of Communication with Friends and Family: Not on file   Frequency of Social Gatherings with Friends and Family: Not on file   Attends Religious Services: Not on file   Active Member of Clubs or Organizations: Not on file   Attends Archivist Meetings: Not on file   Marital Status: Not on file  Intimate Partner Violence:    Fear of Current or Ex-Partner: Not on file   Emotionally Abused: Not on file   Physically Abused: Not on file   Sexually Abused: Not on file   Review of Systems  Weight up slightly Sleeps okay     Objective:   Physical Exam Constitutional:      Appearance: Normal appearance.  Neurological:     Mental Status: He is alert.  Psychiatric:        Mood and Affect: Mood normal.        Behavior: Behavior normal.            Assessment & Plan:

## 2020-09-05 NOTE — Assessment & Plan Note (Signed)
No problems with PDMP

## 2020-09-07 LAB — DRUG MONITORING, PANEL 8 WITH CONFIRMATION, URINE
6 Acetylmorphine: NEGATIVE ng/mL (ref <10)
Alcohol Metabolites: NEGATIVE ng/mL
Amphetamines: NEGATIVE ng/mL (ref <500)
Benzodiazepines: NEGATIVE ng/mL (ref <100)
Buprenorphine, Urine: NEGATIVE ng/mL (ref <5)
Cocaine Metabolite: NEGATIVE ng/mL (ref <150)
Codeine: NEGATIVE ng/mL (ref <50)
Creatinine: 52 mg/dL
Hydrocodone: 58 ng/mL — ABNORMAL HIGH (ref <50)
Hydromorphone: NEGATIVE ng/mL (ref <50)
MDMA: NEGATIVE ng/mL (ref <500)
Marijuana Metabolite: NEGATIVE ng/mL (ref <20)
Morphine: NEGATIVE ng/mL (ref <50)
Norhydrocodone: 146 ng/mL — ABNORMAL HIGH (ref <50)
Opiates: POSITIVE ng/mL — AB (ref <100)
Oxidant: NEGATIVE ug/mL
Oxycodone: NEGATIVE ng/mL (ref <100)
pH: 5.5 (ref 4.5–9.0)

## 2020-09-07 LAB — DM TEMPLATE

## 2020-09-08 DIAGNOSIS — M62830 Muscle spasm of back: Secondary | ICD-10-CM | POA: Diagnosis not present

## 2020-09-08 DIAGNOSIS — M5416 Radiculopathy, lumbar region: Secondary | ICD-10-CM | POA: Diagnosis not present

## 2020-09-08 DIAGNOSIS — M955 Acquired deformity of pelvis: Secondary | ICD-10-CM | POA: Diagnosis not present

## 2020-09-08 DIAGNOSIS — M9903 Segmental and somatic dysfunction of lumbar region: Secondary | ICD-10-CM | POA: Diagnosis not present

## 2020-09-10 ENCOUNTER — Other Ambulatory Visit: Payer: Self-pay | Admitting: Internal Medicine

## 2020-09-10 DIAGNOSIS — M62830 Muscle spasm of back: Secondary | ICD-10-CM | POA: Diagnosis not present

## 2020-09-10 DIAGNOSIS — M955 Acquired deformity of pelvis: Secondary | ICD-10-CM | POA: Diagnosis not present

## 2020-09-10 DIAGNOSIS — M5416 Radiculopathy, lumbar region: Secondary | ICD-10-CM | POA: Diagnosis not present

## 2020-09-10 DIAGNOSIS — M9903 Segmental and somatic dysfunction of lumbar region: Secondary | ICD-10-CM | POA: Diagnosis not present

## 2020-09-10 NOTE — Telephone Encounter (Signed)
Name of [place] Name of Pharmacy:[facility] Last [place] or Written Date and Quantity:07-29-20#60 Last Office Visit and Type:09-05-20 Next Office Visit and Type:Not Scheduled Last Controlled Substance Agreement Date:09-05-20 Last UDS:09-05-20

## 2020-09-11 DIAGNOSIS — M62830 Muscle spasm of back: Secondary | ICD-10-CM | POA: Diagnosis not present

## 2020-09-11 DIAGNOSIS — M9903 Segmental and somatic dysfunction of lumbar region: Secondary | ICD-10-CM | POA: Diagnosis not present

## 2020-09-11 DIAGNOSIS — M5416 Radiculopathy, lumbar region: Secondary | ICD-10-CM | POA: Diagnosis not present

## 2020-09-11 DIAGNOSIS — M955 Acquired deformity of pelvis: Secondary | ICD-10-CM | POA: Diagnosis not present

## 2020-09-15 DIAGNOSIS — M62830 Muscle spasm of back: Secondary | ICD-10-CM | POA: Diagnosis not present

## 2020-09-15 DIAGNOSIS — M5416 Radiculopathy, lumbar region: Secondary | ICD-10-CM | POA: Diagnosis not present

## 2020-09-15 DIAGNOSIS — M9903 Segmental and somatic dysfunction of lumbar region: Secondary | ICD-10-CM | POA: Diagnosis not present

## 2020-09-15 DIAGNOSIS — M955 Acquired deformity of pelvis: Secondary | ICD-10-CM | POA: Diagnosis not present

## 2020-09-18 DIAGNOSIS — M9903 Segmental and somatic dysfunction of lumbar region: Secondary | ICD-10-CM | POA: Diagnosis not present

## 2020-09-18 DIAGNOSIS — M5416 Radiculopathy, lumbar region: Secondary | ICD-10-CM | POA: Diagnosis not present

## 2020-09-18 DIAGNOSIS — M62830 Muscle spasm of back: Secondary | ICD-10-CM | POA: Diagnosis not present

## 2020-09-18 DIAGNOSIS — M955 Acquired deformity of pelvis: Secondary | ICD-10-CM | POA: Diagnosis not present

## 2020-09-22 DIAGNOSIS — M955 Acquired deformity of pelvis: Secondary | ICD-10-CM | POA: Diagnosis not present

## 2020-09-22 DIAGNOSIS — M5416 Radiculopathy, lumbar region: Secondary | ICD-10-CM | POA: Diagnosis not present

## 2020-09-22 DIAGNOSIS — M62830 Muscle spasm of back: Secondary | ICD-10-CM | POA: Diagnosis not present

## 2020-09-22 DIAGNOSIS — M9903 Segmental and somatic dysfunction of lumbar region: Secondary | ICD-10-CM | POA: Diagnosis not present

## 2020-10-13 ENCOUNTER — Other Ambulatory Visit: Payer: Self-pay | Admitting: Internal Medicine

## 2020-10-28 IMAGING — DX DG CHEST 2V
2 series · 2 of 2 positions shown · non-contrast
Comparison: Chest radiograph dated 09/05/2019

CLINICAL DATA: Chest pain and shortness of breath

EXAM:
CHEST - 2 VIEW

[chest pa]
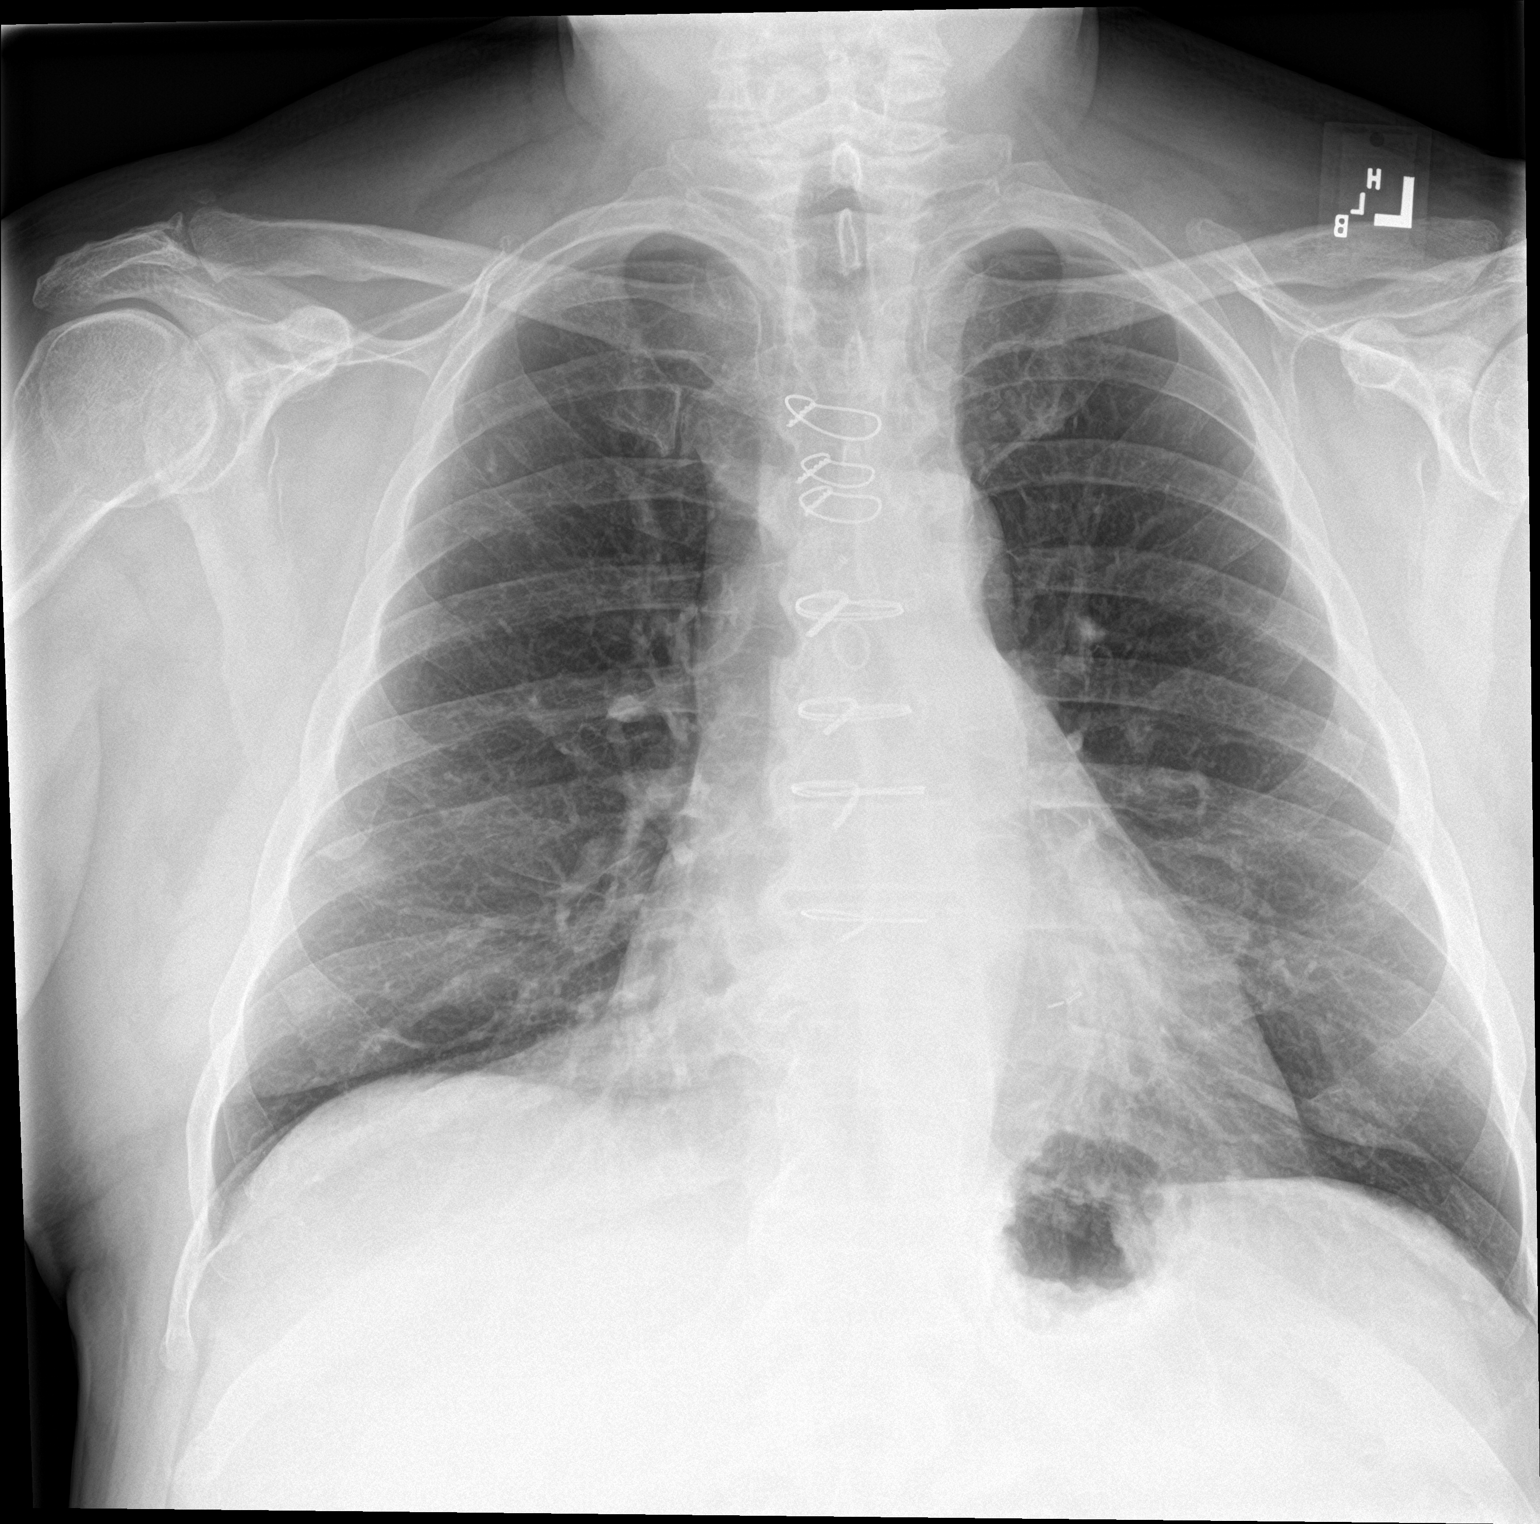

[chest lat]
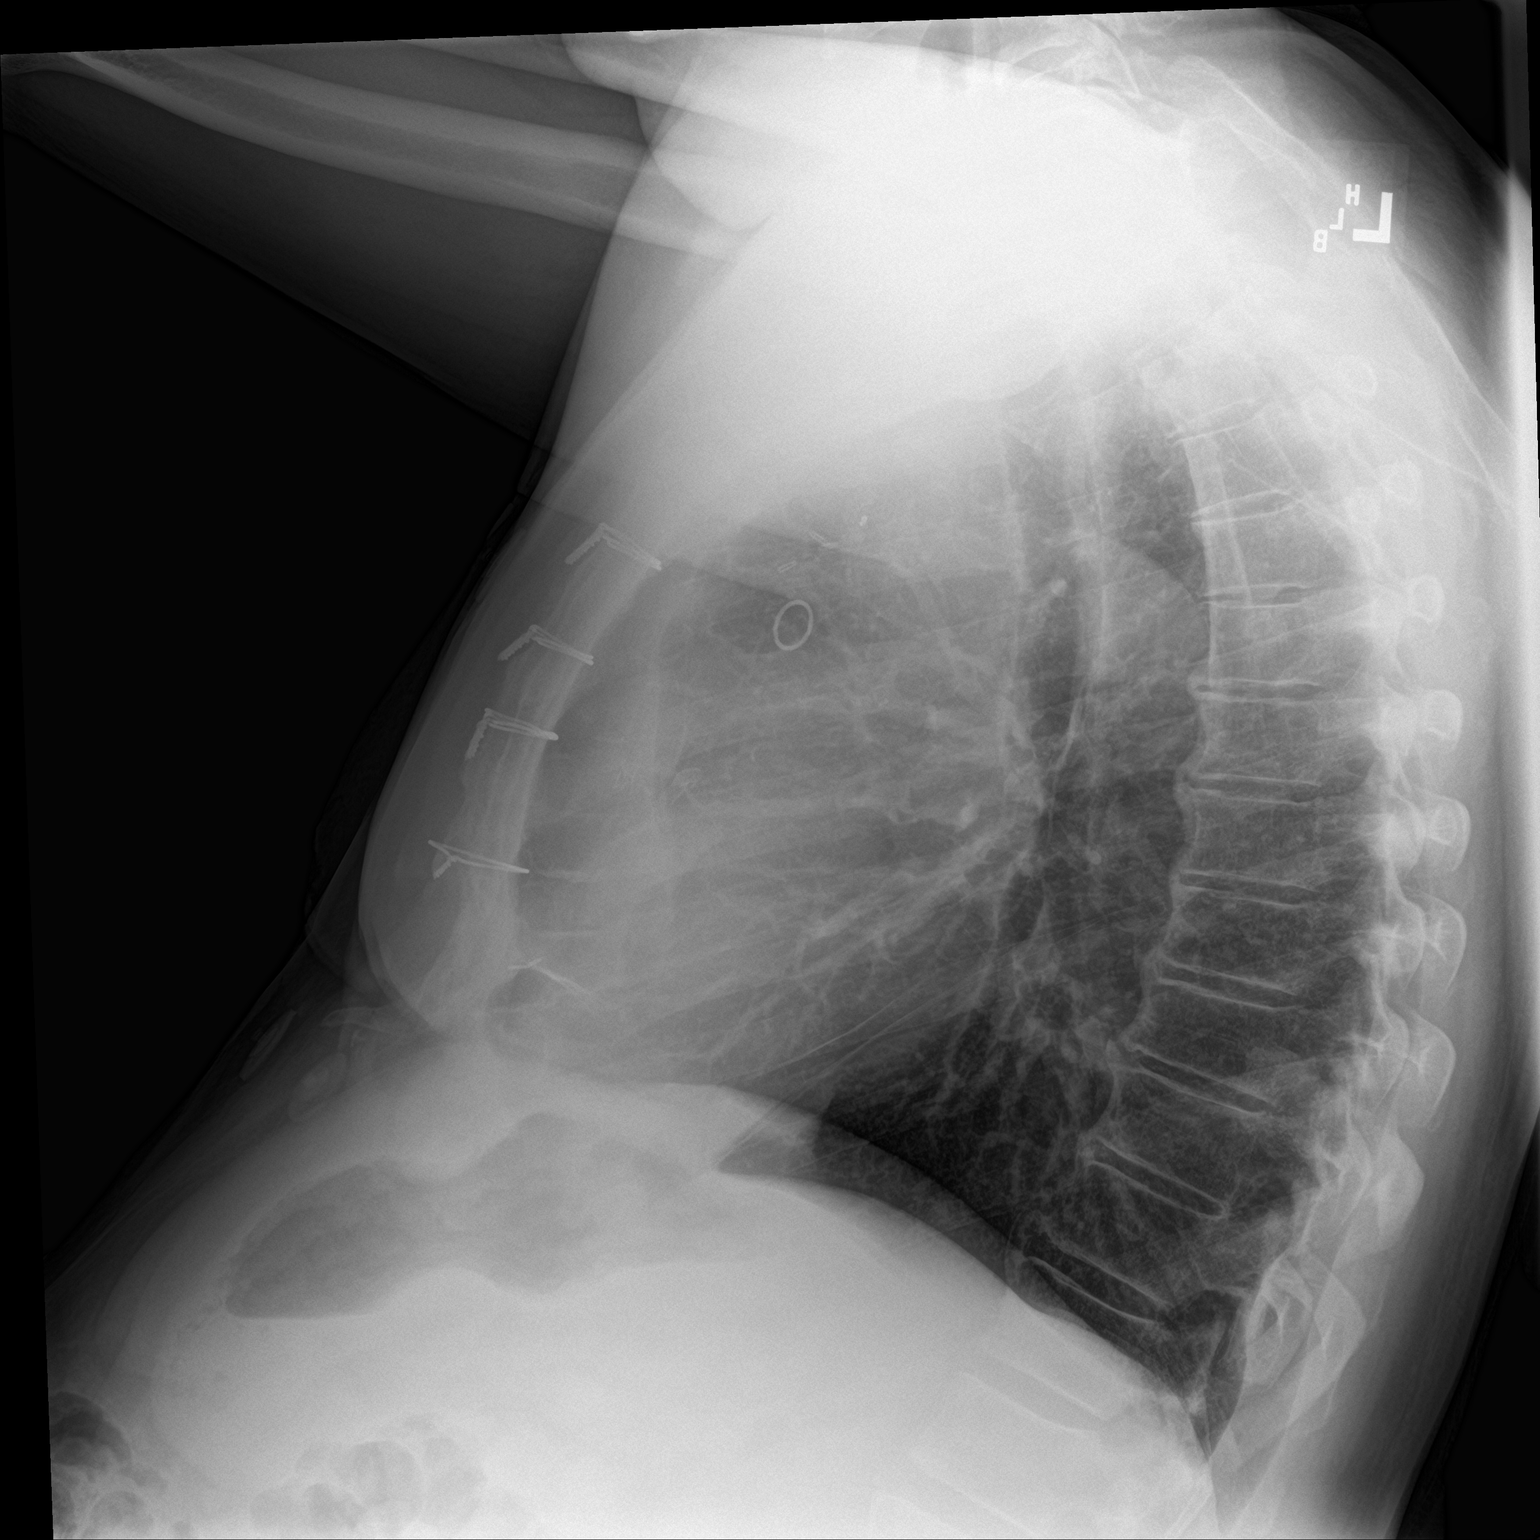

[2 of 2 positions shown; findings below may reference images not displayed]

FINDINGS: The heart remains enlarged. Median sternotomy wires are
redemonstrated. The lungs are clear. There is no pleural effusion or
pneumothorax. Degenerative changes are seen in the spine.
IMPRESSION: No active cardiopulmonary disease.

## 2020-11-10 ENCOUNTER — Telehealth: Payer: Self-pay

## 2020-11-10 NOTE — Telephone Encounter (Signed)
Insurance ran out due to retirement and no longer has insurance as of yet he said he had 2 months after retirement to find it and he still has time left. Was gonna do pa for repatha and nexletol but can't without drug insurance

## 2020-11-10 NOTE — Telephone Encounter (Signed)
-----   Message from Rollen Sox, [facility] sent at 11/10/2020 10:07 AM EST ----- Regarding: nexletol Received PA request for nexletol.  Key: [id number].  Says they can not find patient

## 2020-11-12 NOTE — Progress Notes (Signed)
Cardiology Office Note  Date:  11/17/2020   ID:  [name] [name], DOB Nov 29, 1951, MRN [id number]  PCP:  Venia Carbon, MD   Chief Complaint  Patient presents with  . Follow-up    6 month F/U; Meds verbally reviewed with patient.    HPI:  69 year-old gentleman with  obesity,  hyperlipidemia,  diabetes,  hypertension,  CAD, s/p CABG.  severe LAD disease, bypass surgery November 12 2011.  (Coronary artery bypass grafting x3 with the left internal mammary to the left anterior descending coronary artery, reversed saphenous vein graft sequentially to the first and third diagonals with right thigh and the vein harvesting.) Statin and zetia intolerant, myalgias and joint pain Chronic shortness of breath He presents for routine followup of his coronary artery disease   Reports he is retired from [employer] little bit better, sometimes sees a Restaurant manager, fast food On periodic pain meds Continues to work in [place], [place] reviewed HBA1C 8.1 Total chol 114, LDL 48   no regular exercise program  Atypical chest issues No significant chest pain with exertion Low energy, busier on the farm since retired  Chronic shortness of breath, no improvement after prior intervention to coronary arteries Occasional prickling in the chest  Tolerating PCSK9 inhibitor, numbers much improved,  EKG personally reviewed by myself on todays visit nsr rate 66 IVCD nonspecific ST abnormality  Prior records reviewed Cath , 1. Three-vessel coronary artery disease, including 60% ostial/proximal LMCA stenosis (MLA 5.4 mm^2 by IVUS), chronic total occlusion of the proximal LAD (known patent LIMA-LAD and SVG-D1 and D2), 90% OM1 stenosis, and 50% mid/distal RCA stenosis. 2. Widely patent proximal RCA stent. 3. Normal left ventricular filling pressure. 4. Successful IVUS-guided PCI to the ostial through mid LMCA using Resolute Onyx 3.5 12 mm drug-eluting stent (postdilated to 4.2 mm) with 0% residual  stenosis and TIMI-3 flow. 5. Successful PCI to OM1 using Resolute Onyx 2.25 x 26 mm drug-eluting stent with 0% residual stenosis and TIMI-3 flow.   unable to tolerate a statin including Crestor, Lipitor, simvastatin., zetia Myalgias, "inflammation"  Medication intolerances  unable to tolerate metoprolol secondary to memory problems and mood disorder. Unable to tolerate carvedilol secondary to fatigue  Previous  Echocardiogram  for his shortness of breath showed normal function, no significant pulmonary hypertension. Normal right ventricular systolic pressure.  PMH:   has a past medical history of Angina, Arthritis, BPH (benign prostatic hypertrophy), Carpal tunnel syndrome, Coronary artery disease, GERD (gastroesophageal reflux disease), colonic polyps, Hyperlipidemia, Hypertension, Kidney stones, Morbid obesity ([place]), OSA (obstructive sleep apnea), and Type II diabetes mellitus ([place]).  PSH:    Past Surgical History:  Procedure Laterality Date  . CARDIAC CATHETERIZATION    . CORONARY ARTERY BYPASS GRAFT  11/12/2011   Procedure: CORONARY ARTERY BYPASS GRAFTING (CABG);  Surgeon: Grace Isaac, MD;  Location: [facility];  Service: Open Heart Surgery;  Laterality: N/A;  Coronary Artery Bypass Graft times three on pump utilizing left internal mammary artery and right saphenous vein harvested endoscopically   . CORONARY STENT INTERVENTION N/A 09/27/2019   Procedure: CORONARY STENT INTERVENTION;  Surgeon: Wellington Hampshire, MD;  Location: [facility];  Service: Cardiovascular;  Laterality: N/A;  . CORONARY STENT INTERVENTION N/A 10/01/2019   Procedure: CORONARY STENT INTERVENTION;  Surgeon: Nelva Bush, MD;  Location: [facility];  Service: Cardiovascular;  Laterality: N/A;  . HERNIA REPAIR    . INTRAVASCULAR PRESSURE WIRE/FFR STUDY N/A 09/27/2019   Procedure: INTRAVASCULAR PRESSURE WIRE/FFR  STUDY;  Surgeon: Wellington Hampshire, MD;  Location: [facility];  Service:  Cardiovascular;  Laterality: N/A;  . INTRAVASCULAR ULTRASOUND/IVUS N/A 10/01/2019   Procedure: Intravascular Ultrasound/IVUS;  Surgeon: Nelva Bush, MD;  Location: [facility];  Service: Cardiovascular;  Laterality: N/A;  . KNEE ARTHROPLASTY Right 11/23/2017   Procedure: COMPUTER ASSISTED TOTAL KNEE ARTHROPLASTY;  Surgeon: Dereck Leep, MD;  Location: [facility];  Service: Orthopedics;  Laterality: Right;  . KNEE SURGERY     left  . LEFT HEART CATH AND CORONARY ANGIOGRAPHY Left 09/27/2019   Procedure: LEFT HEART CATH AND CORONARY ANGIOGRAPHY;  Surgeon: Minna Merritts, MD;  Location: [facility];  Service: Cardiovascular;  Laterality: Left;  . LEFT HEART CATH AND CORONARY ANGIOGRAPHY N/A 10/01/2019   Procedure: LEFT HEART CATH AND CORONARY ANGIOGRAPHY;  Surgeon: Nelva Bush, MD;  Location: [facility];  Service: Cardiovascular;  Laterality: N/A;  . MENISCUS REPAIR  03/08   right knee    Current Outpatient Medications  Medication Sig Dispense Refill  . albuterol (VENTOLIN HFA) 108 (90 Base) MCG/ACT inhaler Inhale 2 puffs into the lungs every 6 (six) hours as needed for wheezing or shortness of breath. 90 g 0  . Ascorbic Acid (VITAMIN C) 1000 MG tablet Take 1,000 mg by mouth daily.    [place] aspirin EC 81 MG tablet Take 1 tablet (81 mg total) by mouth daily. 90 tablet 3  . clopidogrel (PLAVIX) 75 MG tablet TAKE 1 TABLET DAILY WITH BREAKFAST 90 tablet 0  . Cyanocobalamin (VITAMIN B-12) 3000 MCG SUBL Place under the tongue.    . Evolocumab with Infusor ([place]) 420 MG/3.5ML SOCT Inject 1 applicator into the skin every 30 (thirty) days. 3.6 mL 11  . furosemide (LASIX) 40 MG tablet TAKE 1 TABLET DAILY AS NEEDED 90 tablet 2  . glucose blood (FREESTYLE LITE) test strip Use as instructed, patient tests once daily. Dx: 250.00 300 each 3  . HYDROcodone-acetaminophen (NORCO/VICODIN) 5-325 MG tablet TAKE 1 TABLET BY MOUTH TWICE (2) DAILY 60 tablet 0  .  INVOKANA 300 MG TABS tablet TAKE 1 TABLET DAILY BEFORE BREAKFAST 90 tablet 3  . lisinopril (ZESTRIL) 20 MG tablet TAKE 1 TABLET DAILY 90 tablet 0  . metFORMIN (GLUCOPHAGE) 1000 MG tablet TAKE 1 TABLET BY MOUTH TWICE (2) DAILY WITH A MEAL 180 tablet 3  . nitroGLYCERIN (NITROSTAT) 0.4 MG SL tablet Place 1 tablet (0.4 mg total) under the tongue every 5 (five) minutes as needed for chest pain. 25 tablet 3  . potassium chloride SA (K-DUR,KLOR-CON) 20 MEQ tablet Take 1 tablet (20 mEq total) by mouth 2 (two) times daily as needed (Take with lasix). 90 tablet 3  . tamsulosin (FLOMAX) 0.4 MG CAPS capsule Take 1 capsule (0.4 mg total) by mouth daily. 90 capsule 3  . triamcinolone cream (KENALOG) 0.1 % Apply 1 application topically 2 (two) times daily as needed. (Patient taking differently: Apply 1 application topically 2 (two) times daily as needed (irritation).) 30 g 0  . Zinc 30 MG TABS Take by mouth daily.     No current facility-administered medications for this visit.     Allergies:   Doxazosin mesylate, Tetracyclines & related, Carvedilol, Crestor [rosuvastatin], Doxycycline, Glimepiride, Glipizide, Losartan potassium-hctz, Metoprolol tartrate, Pravastatin, Rosiglitazone maleate, and Simvastatin   Social History:  The patient  reports that he has never smoked. He has never used smokeless tobacco. He reports that he does not drink alcohol and does not use  drugs.   Family History:   family history includes Coronary artery disease in his father; Prostate cancer in his father.    Review of Systems: Review of Systems  Constitutional: Negative.   HENT: Negative.   Respiratory: Positive for shortness of breath.   Cardiovascular: Negative.   Gastrointestinal: Negative.   Musculoskeletal: Positive for joint pain.  Neurological: Negative.   Psychiatric/Behavioral: Negative.   All other systems reviewed and are negative.   PHYSICAL EXAM: VS:  BP 130/60 (BP Location: Left Arm, Patient Position:  Sitting, Cuff Size: Normal)   Pulse 66   Ht 5\' 5"  (1.651 m)   Wt 201 lb (91.2 kg)   SpO2 98%   BMI 33.45 kg/m  , BMI Body mass index is 33.45 kg/m. Constitutional:  oriented to person, place, and time. No distress.  HENT:  Head: Grossly normal Eyes:  no discharge. No scleral icterus.  Neck: No JVD, no carotid bruits  Cardiovascular: Regular rate and rhythm, no murmurs appreciated Pulmonary/Chest: Clear to auscultation bilaterally, no wheezes or rails Abdominal: Soft.  no distension.  no tenderness.  Musculoskeletal: Normal range of motion Neurological:  normal muscle tone. Coordination normal. No atrophy Skin: Skin warm and dry Psychiatric: normal affect, pleasant  Recent Labs: 05/13/2020: ALT 13    Lipid Panel Lab Results  Component Value Date   CHOL 114 05/13/2020   HDL 35 (L) 05/13/2020   LDLCALC 48 05/13/2020   TRIG 192 (H) 05/13/2020      Wt Readings from Last 3 Encounters:  11/17/20 201 lb (91.2 kg)  09/05/20 206 lb (93.4 kg)  06/04/20 199 lb (90.3 kg)     ASSESSMENT AND PLAN:  Essential hypertension - Plan: EKG 12-Lead Blood pressure is well controlled on today's visit. No changes made to the medications.  Coronary artery disease involving native coronary artery of native heart without angina pectoris -  Currently with no symptoms of angina. No further workup at this time. Continue current medication regimen. Recommend regular exercise program  Pure hypercholesterolemia Recommend he continue on PCSK9 inhibitor He is changing over to Medicare, he will see if he is able to afford the medication Recommended if he has any difficulty that he call our office  OSA (obstructive sleep apnea) Recommended weight loss Active on the farm Stable  Uncontrolled type 2 diabetes mellitus with complication, without long-term current use of insulin ([place]) Labile numbers, Managed by primary care Strict low carbohydrate diet recommended  S/P CABG (coronary artery bypass  graft) Occasional atypical symptoms at rest No further ischemic work-up at this time  Shortness of breath Some occupational exposure allergy 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    Signed, Esmond Plants, M.D., Ph.D. 11/17/2020  [place], [place]  \

## 2020-11-17 ENCOUNTER — Encounter: Payer: Self-pay | Admitting: Cardiovascular Disease

## 2020-11-17 ENCOUNTER — Other Ambulatory Visit: Payer: Self-pay

## 2020-11-17 ENCOUNTER — Ambulatory Visit (INDEPENDENT_AMBULATORY_CARE_PROVIDER_SITE_OTHER): Payer: BC Managed Care – PPO | Admitting: Cardiovascular Disease

## 2020-11-17 VITALS — BP 130/60 | HR 66 | Ht 65.0 in | Wt 201.0 lb

## 2020-11-17 DIAGNOSIS — E785 Hyperlipidemia, unspecified: Secondary | ICD-10-CM | POA: Diagnosis not present

## 2020-11-17 DIAGNOSIS — I25118 Atherosclerotic heart disease of native coronary artery with other forms of angina pectoris: Secondary | ICD-10-CM

## 2020-11-17 DIAGNOSIS — R0600 Dyspnea, unspecified: Secondary | ICD-10-CM | POA: Diagnosis not present

## 2020-11-17 DIAGNOSIS — E782 Mixed hyperlipidemia: Secondary | ICD-10-CM | POA: Diagnosis not present

## 2020-11-17 DIAGNOSIS — Z951 Presence of aortocoronary bypass graft: Secondary | ICD-10-CM

## 2020-11-17 DIAGNOSIS — E118 Type 2 diabetes mellitus with unspecified complications: Secondary | ICD-10-CM | POA: Diagnosis not present

## 2020-11-17 DIAGNOSIS — I10 Essential (primary) hypertension: Secondary | ICD-10-CM

## 2020-11-17 DIAGNOSIS — IMO0002 Reserved for concepts with insufficient information to code with codable children: Secondary | ICD-10-CM

## 2020-11-17 DIAGNOSIS — E1165 Type 2 diabetes mellitus with hyperglycemia: Secondary | ICD-10-CM | POA: Diagnosis not present

## 2020-11-17 NOTE — Patient Instructions (Signed)
Medication Instructions:  No changes  If you need a refill on your cardiac medications before your next appointment, please call your pharmacy.    Lab work: No new labs needed   If you have labs (blood work) drawn today and your tests are completely normal, you will receive your results only by: . MyChart Message (if you have MyChart) OR . A paper copy in the mail If you have any lab test that is abnormal or we need to change your treatment, we will call you to review the results.   Testing/Procedures: No new testing needed   Follow-Up: At [facility], you and your health needs are our priority.  As part of our continuing mission to provide you with exceptional heart care, we have created designated Provider Care Teams.  These Care Teams include your primary Cardiologist (physician) and Advanced Practice Providers (APPs -  Physician Assistants and Nurse Practitioners) who all work together to provide you with the care you need, when you need it.  . You will need a follow up appointment in 12 months  . Providers on your designated Care Team:   . Christopher Berge, NP . Ryan Dunn, PA-C . Jacquelyn Visser, PA-C  Any Other Special Instructions Will Be Listed Below (If Applicable).  COVID-19 Vaccine Information can be found at: [email or web address] For questions related to vaccine distribution or appointments, please email [email or web address] or call [phone].     

## 2020-11-18 ENCOUNTER — Other Ambulatory Visit: Payer: Self-pay | Admitting: Internal Medicine

## 2020-11-18 NOTE — Telephone Encounter (Signed)
Name of [place] Name of Pharmacy:[facility] Last Fill or Written Date and Quantity:10-13-20#60 Last Office Visit and Type:09-05-20 Next Office Visit and Type:12-20-19 Last Controlled Substance Agreement Date:09-05-20 Last UDS:09-05-20

## 2020-11-19 ENCOUNTER — Other Ambulatory Visit: Payer: Self-pay | Admitting: Internal Medicine

## 2020-11-25 ENCOUNTER — Other Ambulatory Visit: Payer: Self-pay | Admitting: Cardiovascular Disease

## 2020-11-29 ENCOUNTER — Encounter: Payer: Self-pay | Admitting: Internal Medicine

## 2020-12-01 NOTE — Progress Notes (Signed)
Subjective:    Patient ID: [name] [name], male    DOB: 07-18-1951, 69 y.o.   MRN: [id number]  HPI male never smoker followed for OSA, complicated by DM 2, CAD/ CABG, HBP, GERD, chronic pain/narcotic dependence, hyperlipidemia, BPH NPSG 1998:  AHI 58/hr.  --------------------------------------------------------------------------   12/03/2019- 69 year old male never smoker followed for OSA, complicated by DM 2, CAD/ CABG, HBP, GERD, chronic pain/narcotic dependence, hyperlipidemia, BPH, CPAP 10/[facility] -----1 year f/u OSA.patient stated breathing is not good. SOB sitting,walking .  Had normal spirometry in 2015. Has Ventolin HFA.  Not anemic. Body weight today 206 lbs No download today                Machine is 70 years old.  Noting shortness of breath with rest and exertion over past couple of months. Some better since cardiac stents 2 months ago. Ventolin no help. Little cough or wheeze. Has been sneezing more- blames cold air.  CXR 09/30/2019-  The heart remains enlarged. Median sternotomy wires are redemonstrated. The lungs are clear. There is no pleural effusion or pneumothorax. Degenerative changes are seen in the spine. IMPRESSION: No active cardiopulmonary disease.  12/02/20- 69 year old male never smoker followed for OSA, complicated by DM 2, CAD/ CABG, HBP, GERD, chronic pain/narcotic dependence, hyperlipidemia, BPH, CPAP auto 5-15/[facility]   Machine replaced 11/2019 Download- compliance 100%, AHI 2.7/ hr Body weight today- 199 lbs Covid vax- declines Flu vax- declines -----Patient is feeling good and sleeping ok, no concerns at this time.  He thinks he liked CPAP fixed at 10 more than current aiutopap, but not enough to change back.  We reviewed download. He denies recent cardiac issues, followed by cardiology.   ROS-see HPI   + = positive Constitutional:    weight loss, night sweats, fevers, chills, fatigue, lassitude. HEENT:    headaches, difficulty swallowing,  tooth/dental problems, sore throat,       +sneezing, itching, ear ache, +nasal congestion, post nasal drip, snoring CV:    chest pain, orthopnea, PND, swelling in lower extremities, anasarca,                                                   dizziness, palpitations Resp:   shortness of breath with exertion or at rest.                productive cough,   non-productive cough, coughing up of blood.              change in color of mucus.  wheezing.   Skin:    rash or lesions. GI:  No-   heartburn, indigestion, abdominal pain, nausea, vomiting, diarrhea,                 change in bowel habits, loss of appetite GU: dysuria, change in color of urine, no urgency or frequency.   flank pain. MS:   joint pain, stiffness, decreased range of motion, back pain. Neuro-     nothing unusual Psych:  change in mood or affect.  depression or anxiety.   memory loss.    Objective:  OBJ- Physical Exam General- Alert, Oriented, Affect-appropriate, Distress- none acute, + Full beard,  Skin- rash-none, lesions- none, excoriation- none Lymphadenopathy- none Head- atraumatic            Eyes- Gross vision intact, PERRLA, conjunctivae and secretions  clear            Ears- Hearing, canals-normal            Nose- Clear, no-Septal dev, mucus, polyps, erosion, perforation             Throat- Mallampati II-III , mucosa clear , drainage- none, tonsils- atrophic Neck- flexible , trachea midline, no stridor , thyroid nl, carotid no bruit Chest - symmetrical excursion , unlabored           Heart/CV- RRR , no murmur , no gallop  , no rub, nl s1 s2                           - JVD- none , edema- none, stasis changes- none, varices- none           Lung- clear to P&A, wheeze- none, cough- none , dullness-none, rub- none           Chest wall-  Abd-  Br/ Gen/ Rectal- Not done, not indicated Extrem- cyanosis- none, clubbing, none, atrophy- none, strength- nl Neuro- grossly intact to observation    Assessment & Plan:

## 2020-12-02 ENCOUNTER — Ambulatory Visit (INDEPENDENT_AMBULATORY_CARE_PROVIDER_SITE_OTHER): Payer: BC Managed Care – PPO | Admitting: Internal Medicine

## 2020-12-02 ENCOUNTER — Encounter: Payer: Self-pay | Admitting: Internal Medicine

## 2020-12-02 ENCOUNTER — Other Ambulatory Visit: Payer: Self-pay

## 2020-12-02 DIAGNOSIS — G4733 Obstructive sleep apnea (adult) (pediatric): Secondary | ICD-10-CM

## 2020-12-02 DIAGNOSIS — I25118 Atherosclerotic heart disease of native coronary artery with other forms of angina pectoris: Secondary | ICD-10-CM | POA: Diagnosis not present

## 2020-12-02 NOTE — Patient Instructions (Signed)
We can continue CPAP auto 5-15. If you would like to try changing back to fixed pressure 10 where you were before, just let me know.  You can talk with [facility] about masks and head gear. If they can't meet your needs, please let me know and we can refer you to the sleep center tech for mask fitting to see if they can find something that suits you better.   Please call if we can help

## 2020-12-08 ENCOUNTER — Ambulatory Visit: Payer: BC Managed Care – PPO

## 2020-12-19 ENCOUNTER — Other Ambulatory Visit: Payer: Self-pay

## 2020-12-19 ENCOUNTER — Encounter: Payer: Self-pay | Admitting: Internal Medicine

## 2020-12-19 ENCOUNTER — Ambulatory Visit (INDEPENDENT_AMBULATORY_CARE_PROVIDER_SITE_OTHER): Payer: BC Managed Care – PPO | Admitting: Internal Medicine

## 2020-12-19 VITALS — BP 114/68 | HR 72 | Temp 97.4°F | Ht 64.25 in | Wt 195.0 lb

## 2020-12-19 DIAGNOSIS — Z0000 Encounter for general adult medical examination without abnormal findings: Secondary | ICD-10-CM

## 2020-12-19 DIAGNOSIS — G894 Chronic pain syndrome: Secondary | ICD-10-CM | POA: Diagnosis not present

## 2020-12-19 DIAGNOSIS — F1120 Opioid dependence, uncomplicated: Secondary | ICD-10-CM | POA: Diagnosis not present

## 2020-12-19 DIAGNOSIS — E1159 Type 2 diabetes mellitus with other circulatory complications: Secondary | ICD-10-CM

## 2020-12-19 DIAGNOSIS — I25118 Atherosclerotic heart disease of native coronary artery with other forms of angina pectoris: Secondary | ICD-10-CM | POA: Diagnosis not present

## 2020-12-19 DIAGNOSIS — Z125 Encounter for screening for malignant neoplasm of prostate: Secondary | ICD-10-CM | POA: Diagnosis not present

## 2020-12-19 DIAGNOSIS — Z1211 Encounter for screening for malignant neoplasm of colon: Secondary | ICD-10-CM

## 2020-12-19 DIAGNOSIS — Z7189 Other specified counseling: Secondary | ICD-10-CM

## 2020-12-19 DIAGNOSIS — N138 Other obstructive and reflux uropathy: Secondary | ICD-10-CM

## 2020-12-19 DIAGNOSIS — N401 Enlarged prostate with lower urinary tract symptoms: Secondary | ICD-10-CM

## 2020-12-19 DIAGNOSIS — F39 Unspecified mood [affective] disorder: Secondary | ICD-10-CM

## 2020-12-19 LAB — CBC
HCT: 48.4 % (ref 39.0–52.0)
Hemoglobin: 16.1 g/dL (ref 13.0–17.0)
MCHC: 33.3 g/dL (ref 30.0–36.0)
MCV: 88.5 fl (ref 78.0–100.0)
Platelets: 230 10*3/uL (ref 150.0–400.0)
RBC: 5.47 Mil/uL (ref 4.22–5.81)
RDW: 14.1 % (ref 11.5–15.5)
WBC: 8.7 10*3/uL (ref 4.0–10.5)

## 2020-12-19 LAB — PSA, MEDICARE: PSA: 0.68 ng/ml (ref 0.10–4.00)

## 2020-12-19 LAB — LIPID PANEL
Cholesterol: 200 mg/dL (ref 0–200)
HDL: 48.3 mg/dL (ref >39.00)
LDL Cholesterol: 115 mg/dL — ABNORMAL HIGH (ref 0–99)
NonHDL: 152.03
Total CHOL/HDL Ratio: 4
Triglycerides: 185 mg/dL — ABNORMAL HIGH (ref 0.0–149.0)
VLDL: 37 mg/dL (ref 0.0–40.0)

## 2020-12-19 LAB — COMPREHENSIVE METABOLIC PANEL
ALT: 15 U/L (ref 0–53)
AST: 12 U/L (ref 0–37)
Albumin: 4.8 g/dL (ref 3.5–5.2)
Alkaline Phosphatase: 86 U/L (ref 39–117)
BUN: 20 mg/dL (ref 6–23)
CO2: 24 mEq/L (ref 19–32)
Calcium: 9.5 mg/dL (ref 8.4–10.5)
Chloride: 104 mEq/L (ref 96–112)
Creatinine, Ser: 0.87 mg/dL (ref 0.40–1.50)
GFR: 87.62 mL/min (ref >60.00)
Glucose, Bld: 182 mg/dL — ABNORMAL HIGH (ref 70–99)
Potassium: 4.2 mEq/L (ref 3.5–5.1)
Sodium: 137 mEq/L (ref 135–145)
Total Bilirubin: 0.8 mg/dL (ref 0.2–1.2)
Total Protein: 7 g/dL (ref 6.0–8.3)

## 2020-12-19 LAB — HM DIABETES FOOT EXAM

## 2020-12-19 LAB — HEMOGLOBIN A1C: Hgb A1c MFr Bld: 8.8 % — ABNORMAL HIGH (ref 4.6–6.5)

## 2020-12-19 MED ORDER — LORAZEPAM 0.5 MG PO TABS: 0.2500 mg | ORAL_TABLET | Freq: Two times a day (BID) | ORAL | 0 refills | Status: AC | PRN

## 2020-12-19 NOTE — Assessment & Plan Note (Signed)
I have personally reviewed the Medicare Annual Wellness questionnaire and have noted 1. The patient's medical and social history 2. Their use of alcohol, tobacco or illicit drugs 3. Their current medications and supplements 4. The patient's functional ability including ADL's, fall risks, home safety risks and hearing or visual             impairment. 5. Diet and physical activities 6. Evidence for depression or mood disorders  The patients weight, height, BMI and visual acuity have been recorded in the chart I have made referrals, counseling and provided education to the patient based review of the above and I have provided the pt with a written personalized care plan for preventive services.  I have provided you with a copy of your personalized plan for preventive services. Please take the time to review along with your updated medication list.  Discussed fitness--works on farm Won't take COVID or flu vaccines Will consider shingrix Discussed PSA---will check today FIT

## 2020-12-19 NOTE — Progress Notes (Signed)
Hearing Screening   125Hz  250Hz  500Hz  1000Hz  2000Hz  3000Hz  4000Hz  6000Hz  8000Hz   Right ear:   20 20 20  20     Left ear:   20 20 20  20     Vision Screening Comments: May 2021

## 2020-12-19 NOTE — Assessment & Plan Note (Signed)
PDMP reviewed No concerns 

## 2020-12-19 NOTE — Assessment & Plan Note (Signed)
With knees/shoulders, etc Uses the hydrocodone once or twice a day

## 2020-12-19 NOTE — Progress Notes (Signed)
Subjective:    Patient ID: [name] [name], male    DOB: 1951/02/20, 70 y.o.   MRN: [id number]  HPI Here for initial Medicare wellness visit and follow up of chronic health conditions This visit occurred during the SARS-CoV-2 public health emergency.  Safety protocols were in place, including screening questions prior to the visit, additional usage of staff PPE, and extensive cleaning of exam room while observing appropriate contact time as indicated for disinfecting solutions.   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 No injuries Having some mood issues Independent with instrumental ADLs Some memory issues---relates to stress and paperwork. 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 albuterol (VENTOLIN HFA) 108 (90 Base) MCG/ACT inhaler Inhale 2 puffs into the lungs every 6 (six) hours as needed for wheezing or shortness of breath. 90 g 0  . Ascorbic Acid (VITAMIN C) 1000 MG tablet Take 1,000 mg by mouth daily.    Marland Kitchen aspirin EC 81 MG tablet Take 1 tablet (81 mg total) by mouth daily. 90 tablet 3  . clopidogrel (PLAVIX) 75 MG tablet TAKE  1 TABLET BY MOUTH ONCE A DAY WITH BREAKFAST 30 tablet 10  . Cyanocobalamin (VITAMIN B-12) 3000 MCG SUBL Place under the tongue.    . Evolocumab with Infusor ([place]) 420 MG/3.5ML SOCT Inject 1 applicator into the skin every 30 (thirty) days. 3.6 mL 11  . furosemide (LASIX) 40 MG tablet TAKE 1 TABLET DAILY AS NEEDED 90 tablet 2  . glucose blood (FREESTYLE LITE) test strip Use as instructed, patient tests once daily. Dx: 250.00 300 each 3  . HYDROcodone-acetaminophen (NORCO/VICODIN) 5-325 MG tablet TAKE 1 TABLET BY MOUTH TWICE (2) DAILY 60 tablet 0  . INVOKANA 300 MG TABS tablet TAKE 1 TABLET DAILY BEFORE BREAKFAST 90 tablet 3  . lisinopril (ZESTRIL) 20 MG tablet TAKE 1 TABLET DAILY 90 tablet 0  . metFORMIN (GLUCOPHAGE) 1000 MG tablet TAKE 1 TABLET BY MOUTH TWICE (2) DAILY WITH A MEAL 180 tablet 3  . nitroGLYCERIN (NITROSTAT) 0.4 MG SL tablet Place 1 tablet (0.4 mg total) under the tongue every 5 (five) minutes as needed for chest pain. 25 tablet 3  . potassium chloride SA (K-DUR,KLOR-CON) 20 MEQ tablet Take 1 tablet (20 mEq total) by mouth 2 (two) times daily as needed (Take with lasix). 90 tablet 3  . tamsulosin (FLOMAX) 0.4 MG CAPS capsule Take 1 capsule (0.4 mg total) by mouth daily. 90 capsule 3  . triamcinolone cream (KENALOG) 0.1 % Apply 1 application topically 2 (two) times daily as needed. (Patient taking differently: Apply 1 application topically 2 (two) times daily as needed (irritation).) 30 g 0  . Zinc 30 MG TABS Take by mouth daily.  No current facility-administered medications on file prior to visit.    Allergies  Allergen Reactions  . Doxazosin Mesylate Other (See Comments)    REACTION: didn't work and may have caused SOB  . Tetracyclines & Related   . Carvedilol Other (See Comments)    Felt bad  . Crestor [Rosuvastatin] Other (See Comments)    Muscle aches.  . Doxycycline Other (See Comments)    REACTION: unspecified  . Glimepiride Other (See  Comments)    REACTION: red eyes  . Glipizide Other (See Comments)    REACTION: myalgia??  . Losartan Potassium-Hctz Other (See Comments)    REACTION: chest \\T \ leg pain  . Metoprolol Tartrate Other (See Comments)    Mood swings, memory changes  . Pravastatin Other (See Comments)    Muscle aches   . Rosiglitazone Maleate Other (See Comments)    REACTION: myalgia  . Simvastatin Other (See Comments)    REACTION: myalgias    Past Medical History:  Diagnosis Date  . Angina   . Arthritis   . BPH (benign prostatic hypertrophy)   . Carpal tunnel syndrome   . Coronary artery disease    a. 11/2011 s/p CABG x 3  (LIMA-LAD, SeqSVG-D1-D3); b. 09/2019 CATH-RCA PCI: LM 50ost, LAD 100ost/p, Sev diff diag dzs, LCX nl, OM1 32m, OM3 40, RCA 70ost (iFR 0.88--> 3.0x26 Resolute Onyx DES), 40m, LIMA->LAD ok. VG->D1->D3 ok. EF 50-55%.; c) 10/01/2019: staged PCI Ost LM (Resolute Onyx 3.5 x 12 - 4.2 mm) & Ost OM1 (Resolute Onyx 2.25 x 26 -- 2.5 mm)  . GERD (gastroesophageal reflux disease)   . Hx of colonic polyps   . Hyperlipidemia   . Hypertension   . Kidney stones   . Morbid obesity ([place])   . OSA (obstructive sleep apnea)    uses cpap  . Type II diabetes mellitus ([place])     Past Surgical History:  Procedure Laterality Date  . CARDIAC CATHETERIZATION    . CORONARY ARTERY BYPASS GRAFT  11/12/2011   Procedure: CORONARY ARTERY BYPASS GRAFTING (CABG);  Surgeon: Grace Isaac, MD;  Location: [facility];  Service: Open Heart Surgery;  Laterality: N/A;  Coronary Artery Bypass Graft times three on pump utilizing left internal mammary artery and right saphenous vein harvested endoscopically   . CORONARY STENT INTERVENTION N/A 09/27/2019   Procedure: CORONARY STENT INTERVENTION;  Surgeon: Wellington Hampshire, MD;  Location: [facility];  Service: Cardiovascular;  Laterality: N/A;  . CORONARY STENT INTERVENTION N/A 10/01/2019   Procedure: CORONARY STENT INTERVENTION;  Surgeon: Nelva Bush, MD;   Location: [facility];  Service: Cardiovascular;  Laterality: N/A;  . HERNIA REPAIR    . INTRAVASCULAR PRESSURE WIRE/FFR STUDY N/A 09/27/2019   Procedure: INTRAVASCULAR PRESSURE WIRE/FFR STUDY;  Surgeon: Wellington Hampshire, MD;  Location: [facility];  Service: Cardiovascular;  Laterality: N/A;  . INTRAVASCULAR ULTRASOUND/IVUS N/A 10/01/2019   Procedure: Intravascular Ultrasound/IVUS;  Surgeon: Nelva Bush, MD;  Location: [facility];  Service: Cardiovascular;  Laterality: N/A;  . KNEE ARTHROPLASTY Right 11/23/2017   Procedure: COMPUTER ASSISTED TOTAL KNEE ARTHROPLASTY;  Surgeon: Dereck Leep, MD;  Location: [facility];  Service: Orthopedics;  Laterality: Right;  . KNEE SURGERY     left  . LEFT HEART CATH AND CORONARY ANGIOGRAPHY Left 09/27/2019   Procedure: LEFT HEART CATH AND CORONARY ANGIOGRAPHY;  Surgeon: Minna Merritts, MD;  Location: [facility];  Service: Cardiovascular;  Laterality: Left;  . LEFT HEART CATH  AND CORONARY ANGIOGRAPHY N/A 10/01/2019   Procedure: LEFT HEART CATH AND CORONARY ANGIOGRAPHY;  Surgeon: Nelva Bush, MD;  Location: [facility];  Service: Cardiovascular;  Laterality: N/A;  . MENISCUS REPAIR  03/08   right knee    Family History  Problem Relation Age of Onset  . Coronary artery disease Father   . Prostate cancer Father   . Diabetes Neg Hx   . Hypertension Neg Hx     Social History   Socioeconomic History  . Marital status: Married    Spouse name: Not on file  . Number of children: 3  . Years of education: Not on file  . Highest education level: Not on file  Occupational History  . Occupation: Surveyor, quantity    Comment: Retired  . Occupation: Multimedia programmer &/or corn farming  Tobacco Use  . Smoking status: Never Smoker  . Smokeless tobacco: Never Used  Vaping Use  . Vaping Use: Never used  Substance and Sexual Activity  . Alcohol use: No  . Drug use: No  . Sexual activity: Yes  Other Topics Concern   . Not on file  Social History Narrative   No living will   Requests wife as health care POA   Would accept resuscitation   Would accept tube feedings   Social Determinants of Health   Financial Resource Strain: Not on file  Food Insecurity: Not on file  Transportation Needs: Not on file  Physical Activity: Not on file  Stress: Not on file  Social Connections: Not on file  Intimate Partner Violence: Not on file   Review of Systems Sleeps with CPAP---does well Appetite is good Weight is down about 10# in past year Wears seat belt--forgets at times Has full dentures No suspicious skin lesions---still has knot at the corner of left eye (years) No heartburn---or rare. Rolaids help. No dysphagia Bowels are fine. No blood Voids okay on tamsulosin. Nocturia x 1 usually Ongoing knee and shoulder pain     Objective:   Physical Exam Constitutional:      Appearance: Normal appearance.  HENT:     Mouth/Throat:     Comments: No lesions edentulous Eyes:     Conjunctiva/sclera: Conjunctivae normal.     Pupils: Pupils are equal, round, and reactive to light.  Cardiovascular:     Rate and Rhythm: Normal rate and regular rhythm.     Pulses: Normal pulses.     Heart sounds: No murmur heard. No gallop.   Pulmonary:     Effort: Pulmonary effort is normal.     Breath sounds: Normal breath sounds. No wheezing or rales.  Abdominal:     Palpations: Abdomen is soft.     Tenderness: There is no abdominal tenderness.  Musculoskeletal:     Cervical back: Neck supple.     Right lower leg: No edema.     Left lower leg: No edema.  Lymphadenopathy:     Cervical: No cervical adenopathy.  Skin:    General: Skin is warm.     Findings: No rash.     Comments: No foot lesions  Neurological:     Mental Status: He is alert and oriented to person, place, and time.     Comments: President---"Biden, Trump, Obama" 100-93-86-97-72-64 D-l-r-o-w Recall 3/3  Normal sensation in feet   Psychiatric:        Mood and Affect: Mood normal.        Behavior: Behavior normal.  Assessment & Plan:

## 2020-12-19 NOTE — Progress Notes (Signed)
Hearing Screening   125Hz  250Hz  500Hz  1000Hz  2000Hz  3000Hz  4000Hz  6000Hz  8000Hz   Right ear:   20 20 20  20     Left ear:   20 20 20  20       Visual Acuity Screening   Right eye Left eye Both eyes  Without correction: 20-30 20-30 20-25  With correction:     Comments: May 2021

## 2020-12-19 NOTE — Assessment & Plan Note (Signed)
Not checking sugars---asked him to check occasionally On metformin and invokana Might need to consider adding insulin or a -glutide

## 2020-12-19 NOTE — Assessment & Plan Note (Signed)
Voids okay on the tamsulosin

## 2020-12-19 NOTE — Assessment & Plan Note (Signed)
See social history 

## 2020-12-19 NOTE — Assessment & Plan Note (Signed)
Has twinges in chest--not clearly angina No change in his exertional abilities On repatha, asa, plavix, lisinopril

## 2020-12-19 NOTE — Assessment & Plan Note (Signed)
Frustration, mild anxiety and depression related to technical things with retirement---insurance, etc Will give small dose of lorazepam for prn (especially if can't sleep)

## 2021-01-06 ENCOUNTER — Other Ambulatory Visit: Payer: Self-pay | Admitting: Internal Medicine

## 2021-01-06 ENCOUNTER — Telehealth: Payer: Self-pay | Admitting: Cardiovascular Disease

## 2021-01-06 ENCOUNTER — Other Ambulatory Visit: Payer: Self-pay

## 2021-01-06 MED ORDER — LISINOPRIL 20 MG PO TABS: 20.0000 mg | ORAL_TABLET | Freq: Every day | ORAL | 3 refills | Status: DC

## 2021-01-06 NOTE — Telephone Encounter (Signed)
Name of [place] Name of Pharmacy:[facility] Last [place] or Written Date and Quantity:10-13-20#60 Last Office Visit and Type:12-19-20 Next Office Visit and Type:06-23-21 Last Controlled Substance Agreement Date:09-05-20 Last UDS:09-05-20

## 2021-01-06 NOTE — Telephone Encounter (Signed)
lisinopril (ZESTRIL) 20 MG tablet 90 tablet 3 01/06/2021    Sig - Route: Take 1 tablet (20 mg total) by mouth daily. - Oral   Sent to pharmacy as: lisinopril (ZESTRIL) 20 MG tablet   E-Prescribing Status: Receipt confirmed by pharmacy (01/06/2021  2:05 PM EST)     Pharmacy  [facility], [place]

## 2021-01-06 NOTE — Telephone Encounter (Signed)
*  STAT* If patient is at the pharmacy, call can be transferred to refill team.   1. Which medications need to be refilled? (please list name of each medication and dose if known) lisinopril   2. Which pharmacy/location (including street and city if local pharmacy) is medication to be sent to? [facility]  3. Do they need a 30 day or 90 day supply? [place]

## 2021-01-07 ENCOUNTER — Telehealth: Payer: Self-pay | Admitting: Internal Medicine

## 2021-01-07 MED ORDER — RYBELSUS 7 MG PO TABS: 1.0000 | ORAL_TABLET | Freq: Every day | ORAL | 11 refills | Status: DC

## 2021-01-07 MED ORDER — RYBELSUS 3 MG PO TABS: 1.0000 | ORAL_TABLET | Freq: Every day | ORAL | 0 refills | Status: DC

## 2021-01-07 NOTE — Telephone Encounter (Signed)
Left message to call office. I sent in the rxs for the 3mg  and 7mg .

## 2021-01-07 NOTE — Telephone Encounter (Signed)
That would be the Rybelsus. I will send the message to Dr Silvio Pate.

## 2021-01-07 NOTE — Telephone Encounter (Signed)
Send prescription for semaglutide 3mg  (#30 x 0) Then semaglutide 7mg  (#30 x 11) Have him let us know if insurance gives him a hard time (he doesn't think he can manage the shots so this is the only possibility in that class)

## 2021-01-07 NOTE — Telephone Encounter (Signed)
Spoke to pt

## 2021-01-07 NOTE — Addendum Note (Signed)
Addended by: Pilar Grammes on: 01/07/2021 04:18 PM   Modules accepted: Orders

## 2021-01-07 NOTE — Telephone Encounter (Signed)
Patient called in saying it is ok to send the "medication that lowers his A1C" into [facility]. EM

## 2021-01-22 ENCOUNTER — Telehealth: Payer: Self-pay | Admitting: Cardiovascular Disease

## 2021-01-22 ENCOUNTER — Encounter: Payer: Self-pay | Admitting: Internal Medicine

## 2021-01-22 NOTE — Telephone Encounter (Signed)
*  STAT* If patient is at the pharmacy, call can be transferred to refill team.   1. Which medications need to be refilled? (please list name of each medication and dose if known) Repatha - states he is having difficulty getting due to insurance - needs approval   2. Which pharmacy/location (including street and city if local pharmacy) is medication to be sent to? [place]   3. Do they need a 30 day or 90 day supply?

## 2021-01-22 NOTE — Assessment & Plan Note (Signed)
He continues cardiology f/u

## 2021-01-22 NOTE — Telephone Encounter (Signed)
Repatha 140mg /mL [email or web address] KEY: [id number] Waiting for [employer] to return a determination.

## 2021-01-22 NOTE — Assessment & Plan Note (Signed)
He chooses to continue on with CPAP auto 5-15 We discussed goals and comfort. Plan- continue auto 5-15

## 2021-01-22 NOTE — Telephone Encounter (Signed)
Attempted to call pharmacy but their number is not working at this time. Patient states Rx coverage is through [employer].  ID# [id number] PCN: [id number] RxGrp: [id number] RxBIN: [id number]  Prior Authorization initiated in [email or web address]

## 2021-01-23 NOTE — Telephone Encounter (Signed)
Received approval fax for [place]. Medication has been approved until 12/05/2021

## 2021-01-30 ENCOUNTER — Other Ambulatory Visit (INDEPENDENT_AMBULATORY_CARE_PROVIDER_SITE_OTHER): Payer: Medicare HMO

## 2021-01-30 DIAGNOSIS — Z1211 Encounter for screening for malignant neoplasm of colon: Secondary | ICD-10-CM

## 2021-01-30 LAB — FECAL OCCULT BLOOD, IMMUNOCHEMICAL: Fecal Occult Bld: NEGATIVE

## 2021-02-03 DIAGNOSIS — H40053 Ocular hypertension, bilateral: Secondary | ICD-10-CM | POA: Diagnosis not present

## 2021-02-03 DIAGNOSIS — E113212 Type 2 diabetes mellitus with mild nonproliferative diabetic retinopathy with macular edema, left eye: Secondary | ICD-10-CM | POA: Diagnosis not present

## 2021-02-03 DIAGNOSIS — E113291 Type 2 diabetes mellitus with mild nonproliferative diabetic retinopathy without macular edema, right eye: Secondary | ICD-10-CM | POA: Diagnosis not present

## 2021-02-03 DIAGNOSIS — H25813 Combined forms of age-related cataract, bilateral: Secondary | ICD-10-CM | POA: Diagnosis not present

## 2021-02-09 ENCOUNTER — Other Ambulatory Visit: Payer: Self-pay | Admitting: Cardiovascular Disease

## 2021-02-09 ENCOUNTER — Other Ambulatory Visit: Payer: Self-pay | Admitting: *Deleted

## 2021-02-09 NOTE — Telephone Encounter (Signed)
Patient is using Repatha Pushtronex 420mg  once monthly not 140mg /mL biweekly as I previously documented below in error. Spoke with [facility] pharmacist who states the Rx went through and was covered by insurance. (PA approved by [facility])

## 2021-02-09 NOTE — Telephone Encounter (Signed)
[name] [name] from [facility] left a voicemail stating that they need a script sent to them for Invokana and Flomax. [name] [name] stated that the patient was getting these prescriptions from mail order and does not want to get them there any longer. [name] [name] is requesting scripts for these two medications.

## 2021-02-10 DIAGNOSIS — H43823 Vitreomacular adhesion, bilateral: Secondary | ICD-10-CM | POA: Diagnosis not present

## 2021-02-10 DIAGNOSIS — E113312 Type 2 diabetes mellitus with moderate nonproliferative diabetic retinopathy with macular edema, left eye: Secondary | ICD-10-CM | POA: Diagnosis not present

## 2021-02-10 DIAGNOSIS — H35363 Drusen (degenerative) of macula, bilateral: Secondary | ICD-10-CM | POA: Diagnosis not present

## 2021-02-10 DIAGNOSIS — H35033 Hypertensive retinopathy, bilateral: Secondary | ICD-10-CM | POA: Diagnosis not present

## 2021-02-10 MED ORDER — CANAGLIFLOZIN 300 MG PO TABS: 300.0000 mg | ORAL_TABLET | Freq: Every day | ORAL | 3 refills | Status: DC

## 2021-02-10 MED ORDER — TAMSULOSIN HCL 0.4 MG PO CAPS: 0.4000 mg | ORAL_CAPSULE | Freq: Every day | ORAL | 3 refills | Status: DC

## 2021-02-10 NOTE — Telephone Encounter (Signed)
Rxs sent electronically.  

## 2021-02-23 ENCOUNTER — Other Ambulatory Visit: Payer: Self-pay | Admitting: Internal Medicine

## 2021-02-23 NOTE — Telephone Encounter (Signed)
Name of [place] Name of Pharmacy:[facility] Last [place] or Written Date and Quantity:01-07-21#60 Last Office Visit and Type:12-19-20 Next Office Visit and Type:06-23-21 Last Controlled Substance Agreement Date:09-05-20 Last UDS:09-05-20

## 2021-04-06 ENCOUNTER — Telehealth: Payer: Self-pay

## 2021-04-06 NOTE — Telephone Encounter (Signed)
Spoke to pt. He would rather not do injections. Asking if there is an oral medication he can try.

## 2021-04-06 NOTE — Telephone Encounter (Signed)
Pt called to report that he will no longer be able to take as Semaglutide due to costing him over $1000 now, he will need to change medication if possible... Pt reports he may have 1-2 months supply left at this time.

## 2021-04-06 NOTE — Telephone Encounter (Signed)
It is likely because it is the pills. He will need to check with pharmacist about the semaglutide injections (or liraglutide or dulaglutide instead)

## 2021-04-06 NOTE — Telephone Encounter (Signed)
There are not really any other choices (he had allergic reactions to glimepiride and actos not a good idea with his heart. Does he think he can do better with his eating?? (and then we could just recheck in 3 months)

## 2021-04-06 NOTE — Telephone Encounter (Signed)
Spoke to pt. I am going to work on getting patient assistance paperwork together for the Rybelsus. He is aware I will nt be in the office tomorrow. I will take care of it Wednesday.

## 2021-04-24 NOTE — Telephone Encounter (Signed)
Left message for pt that I have the paperwork done for our part. I will be out of the office next week. Will be back 04-30-21. He can come get the paperwork and fill out his portion and get all the information together that I need to fax back or he can wait until I return next week.

## 2021-05-21 ENCOUNTER — Other Ambulatory Visit: Payer: Self-pay | Admitting: Internal Medicine

## 2021-05-21 NOTE — Telephone Encounter (Signed)
Name of Medication: Hydrocodone Name of Pharmacy: [facility] or Written Date and Quantity: 02-23-21 #60 Last Office Visit and Type: 12-19-20 Next Office Visit and Type: 06-23-21 Last Controlled Substance Agreement Date: 09-05-20 Last UDS: 09-05-20

## 2021-05-22 ENCOUNTER — Other Ambulatory Visit: Payer: Self-pay | Admitting: Cardiovascular Disease

## 2021-06-23 ENCOUNTER — Ambulatory Visit: Payer: Medicare Other | Admitting: Internal Medicine

## 2021-07-06 ENCOUNTER — Other Ambulatory Visit: Payer: Self-pay | Admitting: Internal Medicine

## 2021-07-06 NOTE — Telephone Encounter (Signed)
Name of Medication: Hydrocodone Name of Pharmacy: [facility] or Written Date and Quantity: 05-21-21 #60 Last Office Visit and Type: 12-19-20 Next Office Visit and Type: 07-22-21 Last Controlled Substance Agreement Date: 09-05-20 Last UDS: 09-05-20

## 2021-07-09 ENCOUNTER — Telehealth: Payer: Self-pay | Admitting: *Deleted

## 2021-07-09 NOTE — Telephone Encounter (Addendum)
Received voicemail on triage from Jeani Hawking, Pharmacist with [facility], following up on a fax they sent the provider requesting statin therapy for [name] [name], who is diabetic and does not appear to be taking a statin.  Recommendations are for patients age 71-75 to decrease risk of cardiovascular risk.  Jeani Hawking is asking for return fax or call.  If they need to refax the form, please call at [phone].

## 2021-07-09 NOTE — Telephone Encounter (Signed)
Spoke to [name] [name] at [facility]. Advised he has tried and failed several statin drugs.

## 2021-07-11 DIAGNOSIS — M5416 Radiculopathy, lumbar region: Secondary | ICD-10-CM | POA: Diagnosis not present

## 2021-07-11 DIAGNOSIS — M9903 Segmental and somatic dysfunction of lumbar region: Secondary | ICD-10-CM | POA: Diagnosis not present

## 2021-07-11 DIAGNOSIS — M955 Acquired deformity of pelvis: Secondary | ICD-10-CM | POA: Diagnosis not present

## 2021-07-11 DIAGNOSIS — M62830 Muscle spasm of back: Secondary | ICD-10-CM | POA: Diagnosis not present

## 2021-07-14 DIAGNOSIS — M955 Acquired deformity of pelvis: Secondary | ICD-10-CM | POA: Diagnosis not present

## 2021-07-14 DIAGNOSIS — M5416 Radiculopathy, lumbar region: Secondary | ICD-10-CM | POA: Diagnosis not present

## 2021-07-14 DIAGNOSIS — M9903 Segmental and somatic dysfunction of lumbar region: Secondary | ICD-10-CM | POA: Diagnosis not present

## 2021-07-14 DIAGNOSIS — M62830 Muscle spasm of back: Secondary | ICD-10-CM | POA: Diagnosis not present

## 2021-07-16 DIAGNOSIS — M955 Acquired deformity of pelvis: Secondary | ICD-10-CM | POA: Diagnosis not present

## 2021-07-16 DIAGNOSIS — M5416 Radiculopathy, lumbar region: Secondary | ICD-10-CM | POA: Diagnosis not present

## 2021-07-16 DIAGNOSIS — M62830 Muscle spasm of back: Secondary | ICD-10-CM | POA: Diagnosis not present

## 2021-07-16 DIAGNOSIS — M9903 Segmental and somatic dysfunction of lumbar region: Secondary | ICD-10-CM | POA: Diagnosis not present

## 2021-07-20 DIAGNOSIS — M955 Acquired deformity of pelvis: Secondary | ICD-10-CM | POA: Diagnosis not present

## 2021-07-20 DIAGNOSIS — M5416 Radiculopathy, lumbar region: Secondary | ICD-10-CM | POA: Diagnosis not present

## 2021-07-20 DIAGNOSIS — M9903 Segmental and somatic dysfunction of lumbar region: Secondary | ICD-10-CM | POA: Diagnosis not present

## 2021-07-20 DIAGNOSIS — M62830 Muscle spasm of back: Secondary | ICD-10-CM | POA: Diagnosis not present

## 2021-07-22 ENCOUNTER — Other Ambulatory Visit: Payer: Self-pay

## 2021-07-22 ENCOUNTER — Ambulatory Visit (INDEPENDENT_AMBULATORY_CARE_PROVIDER_SITE_OTHER): Payer: Medicare HMO | Admitting: Internal Medicine

## 2021-07-22 ENCOUNTER — Encounter: Payer: Self-pay | Admitting: Internal Medicine

## 2021-07-22 VITALS — BP 114/72 | HR 70 | Temp 97.5°F | Ht 64.0 in | Wt 198.0 lb

## 2021-07-22 DIAGNOSIS — E1159 Type 2 diabetes mellitus with other circulatory complications: Secondary | ICD-10-CM

## 2021-07-22 DIAGNOSIS — F1120 Opioid dependence, uncomplicated: Secondary | ICD-10-CM

## 2021-07-22 DIAGNOSIS — G894 Chronic pain syndrome: Secondary | ICD-10-CM | POA: Diagnosis not present

## 2021-07-22 DIAGNOSIS — M9903 Segmental and somatic dysfunction of lumbar region: Secondary | ICD-10-CM | POA: Diagnosis not present

## 2021-07-22 DIAGNOSIS — B029 Zoster without complications: Secondary | ICD-10-CM | POA: Insufficient documentation

## 2021-07-22 DIAGNOSIS — M5416 Radiculopathy, lumbar region: Secondary | ICD-10-CM | POA: Diagnosis not present

## 2021-07-22 DIAGNOSIS — B019 Varicella without complication: Secondary | ICD-10-CM

## 2021-07-22 DIAGNOSIS — M955 Acquired deformity of pelvis: Secondary | ICD-10-CM | POA: Diagnosis not present

## 2021-07-22 DIAGNOSIS — M62830 Muscle spasm of back: Secondary | ICD-10-CM | POA: Diagnosis not present

## 2021-07-22 LAB — POCT GLYCOSYLATED HEMOGLOBIN (HGB A1C): Hemoglobin A1C: 9.5 % — AB (ref 4.0–5.6)

## 2021-07-22 MED ORDER — DULAGLUTIDE 0.75 MG/0.5ML ~~LOC~~ SOAJ: 0.7500 mg | SUBCUTANEOUS | 5 refills | Status: DC

## 2021-07-22 MED ORDER — VALACYCLOVIR HCL 1 G PO TABS: 1000.0000 mg | ORAL_TABLET | Freq: Three times a day (TID) | ORAL | 1 refills | Status: AC

## 2021-07-22 NOTE — Progress Notes (Signed)
Subjective:    Patient ID: [name] [name], male    DOB: 12/16/1950, 70 y.o.   MRN: [id number]  HPI Here for follow up of diabetes and chronic pain This visit occurred during the SARS-CoV-2 public health emergency.  Safety protocols were in place, including screening questions prior to the visit, additional usage of staff PPE, and extensive cleaning of exam room while observing appropriate contact time as indicated for disinfecting solutions.   Took a bad fall---feet tangled up in a piece of mesh about 10 days ago Hit hard on right shoulder  Side of head hit chair link fence Messed up back and shoulders---going to chiropractor with some improvement Right hip still painful  Also with rash along right flank--mostly itching Started 3 days ago Wondered if it was chiggers  Checks sugars once in a while---usually 150 Appetite isn't changed Weight stable Hasn't take the rybelsus regularly--due to not feeling good on it (like every other day)  Is taking the hydrocodone about once a day More after the fall  Current Outpatient Medications on File Prior to Visit  Medication Sig Dispense Refill   albuterol (VENTOLIN HFA) 108 (90 Base) MCG/ACT inhaler Inhale 2 puffs into the lungs every 6 (six) hours as needed for wheezing or shortness of breath. 90 g 0   Ascorbic Acid (VITAMIN C) 1000 MG tablet Take 1,000 mg by mouth daily.     aspirin EC 81 MG tablet Take 1 tablet (81 mg total) by mouth daily. 90 tablet 3   canagliflozin (INVOKANA) 300 MG TABS tablet Take 1 tablet (300 mg total) by mouth daily before breakfast. 90 tablet 3   clopidogrel (PLAVIX) 75 MG tablet TAKE 1 TABLET BY MOUTH ONCE A DAY WITH BREAKFAST 30 tablet 10   furosemide (LASIX) 40 MG tablet TAKE 1 TABLET DAILY AS NEEDED 90 tablet 2   glucose blood (FREESTYLE LITE) test strip Use as instructed, patient tests once daily. Dx: 250.00 300 each 3   HYDROcodone-acetaminophen (NORCO/VICODIN) 5-325 MG tablet TAKE 1 TABLET BY MOUTH  TWICE (2) DAILY 60 tablet 0   lisinopril (ZESTRIL) 20 MG tablet Take 1 tablet (20 mg total) by mouth daily. 90 tablet 3   LORazepam (ATIVAN) 0.5 MG tablet Take 0.5-1 tablets (0.25-0.5 mg total) by mouth 2 (two) times daily as needed for anxiety. 30 tablet 0   metFORMIN (GLUCOPHAGE) 1000 MG tablet TAKE 1 TABLET BY MOUTH TWICE (2) DAILY WITH A MEAL 180 tablet 3   nitroGLYCERIN (NITROSTAT) 0.4 MG SL tablet Place 1 tablet (0.4 mg total) under the tongue every 5 (five) minutes as needed for chest pain. 25 tablet 3   potassium chloride SA (K-DUR,KLOR-CON) 20 MEQ tablet Take 1 tablet (20 mEq total) by mouth 2 (two) times daily as needed (Take with lasix). 90 tablet 3   REPATHA PUSHTRONEX SYSTEM 420 MG/3.5ML SOCT INJECT ONE APPLICATOR INTO THE SKIN EVERY 30 DAYS 3.5 mL 2   Semaglutide (RYBELSUS) 7 MG TABS Take 1 tablet by mouth daily. 30 tablet 11   tamsulosin (FLOMAX) 0.4 MG CAPS capsule Take 1 capsule (0.4 mg total) by mouth daily. 90 capsule 3   triamcinolone cream (KENALOG) 0.1 % Apply 1 application topically 2 (two) times daily as needed. (Patient taking differently: Apply 1 application topically 2 (two) times daily as needed (irritation).) 30 g 0   Zinc 30 MG TABS Take by mouth daily.     Cyanocobalamin (VITAMIN B-12) 3000 MCG SUBL Place under the tongue. (Patient not taking: Reported on 07/22/2021)  No current facility-administered medications on file prior to visit.    Allergies  Allergen Reactions   Doxazosin Mesylate Other (See Comments)    REACTION: didn't work and may have caused SOB   Tetracyclines & Related    Carvedilol Other (See Comments)    Felt bad   Crestor [Rosuvastatin] Other (See Comments)    Muscle aches.   Doxycycline Other (See Comments)    REACTION: unspecified   Glimepiride Other (See Comments)    REACTION: red eyes   Glipizide Other (See Comments)    REACTION: myalgia??   Losartan Potassium-Hctz Other (See Comments)    REACTION: chest' \\T'$ \ leg pain   Metoprolol  Tartrate Other (See Comments)    Mood swings, memory changes   Pravastatin Other (See Comments)    Muscle aches    Rosiglitazone Maleate Other (See Comments)    REACTION: myalgia   Simvastatin Other (See Comments)    REACTION: myalgias    Past Medical History:  Diagnosis Date   Angina    Arthritis    BPH (benign prostatic hypertrophy)    Carpal tunnel syndrome    Coronary artery disease    a. 11/2011 s/p CABG x 3  (LIMA-LAD, SeqSVG-D1-D3); b. 09/2019 CATH-RCA PCI: LM 50ost, LAD 100ost/p, Sev diff diag dzs, LCX nl, OM1 50m OM3 40, RCA 70ost (iFR 0.88--> 3.0x26 Resolute Onyx DES), 528mLIMA->LAD ok. VG->D1->D3 ok. EF 50-55%.; c) 10/01/2019: staged PCI Ost LM (Resolute Onyx 3.5 x 12 - 4.2 mm) & Ost OM1 (Resolute Onyx 2.25 x 26 -- 2.5 mm)   GERD (gastroesophageal reflux disease)    Hx of colonic polyps    Hyperlipidemia    Hypertension    Kidney stones    Morbid obesity (HCC)    OSA (obstructive sleep apnea)    uses cpap   Type II diabetes mellitus ([facility]    Past Surgical History:  Procedure Laterality Date   CARDIAC CATHETERIZATION     CORONARY ARTERY BYPASS GRAFT  11/12/2011   Procedure: CORONARY ARTERY BYPASS GRAFTING (CABG);  Surgeon: EdGrace IsaacMD;  [facility] Service: [facility] Surgery;  Laterality: N/A;  Coronary Artery Bypass Graft times three on pump utilizing left internal mammary artery and right saphenous vein harvested endoscopically    CORONARY STENT INTERVENTION N/A 09/27/2019   Procedure: CORONARY STENT INTERVENTION;  Surgeon: ArWellington HampshireMD;  [facility];  Service: Cardiovascular;  Laterality: N/A;   CORONARY STENT INTERVENTION N/A 10/01/2019   Procedure: CORONARY STENT INTERVENTION;  Surgeon: EnNelva BushMD;  [facility];  Service: Cardiovascular;  Laterality: N/A;   HERNIA REPAIR     INTRAVASCULAR PRESSURE WIRE/FFR STUDY N/A 09/27/2019   Procedure: INTRAVASCULAR PRESSURE WIRE/FFR STUDY;  Surgeon:  ArWellington HampshireMD;  [facility];  Service: Cardiovascular;  Laterality: N/A;   INTRAVASCULAR ULTRASOUND/IVUS N/A 10/01/2019   Procedure: Intravascular Ultrasound/IVUS;  Surgeon: EnNelva BushMD;  [facility];  Service: Cardiovascular;  Laterality: N/A;   KNEE ARTHROPLASTY Right 11/23/2017   Procedure: COMPUTER ASSISTED TOTAL KNEE ARTHROPLASTY;  Surgeon: HoDereck LeepMD;  Location: [facility];  Service: Orthopedics;  Laterality: Right;   KNEE SURGERY     left   LEFT HEART CATH AND CORONARY ANGIOGRAPHY Left 09/27/2019   Procedure: LEFT HEART CATH AND CORONARY ANGIOGRAPHY;  Surgeon: GoMinna MerrittsMD;  [facility];  Service: Cardiovascular;  Laterality: Left;   LEFT HEART CATH  AND CORONARY ANGIOGRAPHY N/A 10/01/2019   Procedure: LEFT HEART CATH AND CORONARY ANGIOGRAPHY;  Surgeon: Nelva Bush, MD;  Location: [place] [facility];  Service: Cardiovascular;  Laterality: N/A;   MENISCUS REPAIR  03/08   right knee    Family History  Problem Relation Age of Onset   Coronary artery disease Father    Prostate cancer Father    Diabetes Neg Hx    Hypertension Neg Hx     Social History   Socioeconomic History   Marital status: Married    Spouse name: Not on file   Number of children: 3   Years of education: Not on file   Highest education level: Not on file  Occupational History   Occupation: Surveyor, 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  Tobacco Use   Smoking status: Never   Smokeless tobacco: Never  Scientific laboratory technician Use: Never used  Substance and Sexual Activity   Alcohol use: No   Drug use: No   Sexual activity: Yes  Other Topics Concern   Not on file  Social History Narrative   No living will   Requests wife as health care POA   Would accept resuscitation   Would accept tube feedings   Social Determinants of Health   Financial Resource Strain: Not on file  Food  Insecurity: Not on file  Transportation Needs: Not on file  Physical Activity: Not on file  Stress: Not on file  Social Connections: Not on file  Intimate Partner Violence: Not on file   Review of Systems New ringing in ears over months--pulsating feeling like with heartbeat Noticed since stents put in No change in hearing No headache Mild balance issues--feels this could be related to rybelsus    Objective:   Physical Exam Constitutional:      Appearance: Normal appearance.  Cardiovascular:     Rate and Rhythm: Normal rate and regular rhythm.     Pulses: Normal pulses.     Heart sounds: No murmur heard.   No gallop.  Pulmonary:     Effort: Pulmonary effort is normal.     Breath sounds: Normal breath sounds. No wheezing or rales.  Musculoskeletal:     Cervical back: Neck supple.     Comments: No hip tenderness Fair right hip ROM except for almost no internal rotation  Lymphadenopathy:     Cervical: No cervical adenopathy.  Skin:    Comments: Dermatomal red papular rash along right upper lumbar area  Neurological:     Mental Status: He is alert.     Comments: Mildly antalgic gait --favors right side           Assessment & Plan:

## 2021-07-22 NOTE — Assessment & Plan Note (Signed)
Back is stable Usually uses the hydrocodone daily Fell and injured shoulder and hip Hip findings suggest bad arthritis--has been to ortho (not ready to consider THR)

## 2021-07-22 NOTE — Assessment & Plan Note (Signed)
PDMP reviewed No concerns 

## 2021-07-22 NOTE — Assessment & Plan Note (Signed)
Rash consistent with shingles Will give valacyclovir for 1-2 weeks

## 2021-07-22 NOTE — Assessment & Plan Note (Signed)
Lab Results  Component Value Date   HGBA1C 9.5 (A) 07/22/2021   Control actually worse and he hasn't tolerated it Discussed options Only choice would be try another incretin--or insulin Will try injectable dulaglutide Continue metformin and invokana

## 2021-07-23 DIAGNOSIS — M9903 Segmental and somatic dysfunction of lumbar region: Secondary | ICD-10-CM | POA: Diagnosis not present

## 2021-07-23 DIAGNOSIS — M62830 Muscle spasm of back: Secondary | ICD-10-CM | POA: Diagnosis not present

## 2021-07-23 DIAGNOSIS — M5416 Radiculopathy, lumbar region: Secondary | ICD-10-CM | POA: Diagnosis not present

## 2021-07-23 DIAGNOSIS — M955 Acquired deformity of pelvis: Secondary | ICD-10-CM | POA: Diagnosis not present

## 2021-07-27 DIAGNOSIS — M5416 Radiculopathy, lumbar region: Secondary | ICD-10-CM | POA: Diagnosis not present

## 2021-07-27 DIAGNOSIS — M955 Acquired deformity of pelvis: Secondary | ICD-10-CM | POA: Diagnosis not present

## 2021-07-27 DIAGNOSIS — M9903 Segmental and somatic dysfunction of lumbar region: Secondary | ICD-10-CM | POA: Diagnosis not present

## 2021-07-27 DIAGNOSIS — M62830 Muscle spasm of back: Secondary | ICD-10-CM | POA: Diagnosis not present

## 2021-07-30 DIAGNOSIS — M5416 Radiculopathy, lumbar region: Secondary | ICD-10-CM | POA: Diagnosis not present

## 2021-07-30 DIAGNOSIS — M955 Acquired deformity of pelvis: Secondary | ICD-10-CM | POA: Diagnosis not present

## 2021-07-30 DIAGNOSIS — M9903 Segmental and somatic dysfunction of lumbar region: Secondary | ICD-10-CM | POA: Diagnosis not present

## 2021-07-30 DIAGNOSIS — M62830 Muscle spasm of back: Secondary | ICD-10-CM | POA: Diagnosis not present

## 2021-08-04 DIAGNOSIS — M955 Acquired deformity of pelvis: Secondary | ICD-10-CM | POA: Diagnosis not present

## 2021-08-04 DIAGNOSIS — M5416 Radiculopathy, lumbar region: Secondary | ICD-10-CM | POA: Diagnosis not present

## 2021-08-04 DIAGNOSIS — M9903 Segmental and somatic dysfunction of lumbar region: Secondary | ICD-10-CM | POA: Diagnosis not present

## 2021-08-04 DIAGNOSIS — M62830 Muscle spasm of back: Secondary | ICD-10-CM | POA: Diagnosis not present

## 2021-08-06 DIAGNOSIS — M9903 Segmental and somatic dysfunction of lumbar region: Secondary | ICD-10-CM | POA: Diagnosis not present

## 2021-08-06 DIAGNOSIS — M955 Acquired deformity of pelvis: Secondary | ICD-10-CM | POA: Diagnosis not present

## 2021-08-06 DIAGNOSIS — M5416 Radiculopathy, lumbar region: Secondary | ICD-10-CM | POA: Diagnosis not present

## 2021-08-06 DIAGNOSIS — M62830 Muscle spasm of back: Secondary | ICD-10-CM | POA: Diagnosis not present

## 2021-08-11 ENCOUNTER — Other Ambulatory Visit: Payer: Self-pay | Admitting: Internal Medicine

## 2021-08-11 NOTE — Telephone Encounter (Signed)
Name of Medication: Hydrocodone Name of Pharmacy: [facility] or Written Date and Quantity: 07-06-21 #60 Last Office Visit and Type: 07-22-21 Next Office Visit and Type: 10-01-21 Last Controlled Substance Agreement Date: 09-05-20 Last UDS: 09-05-20

## 2021-08-25 DIAGNOSIS — H25813 Combined forms of age-related cataract, bilateral: Secondary | ICD-10-CM | POA: Diagnosis not present

## 2021-08-25 DIAGNOSIS — H40053 Ocular hypertension, bilateral: Secondary | ICD-10-CM | POA: Diagnosis not present

## 2021-08-25 DIAGNOSIS — H11153 Pinguecula, bilateral: Secondary | ICD-10-CM | POA: Diagnosis not present

## 2021-08-25 DIAGNOSIS — E113212 Type 2 diabetes mellitus with mild nonproliferative diabetic retinopathy with macular edema, left eye: Secondary | ICD-10-CM | POA: Diagnosis not present

## 2021-09-28 ENCOUNTER — Other Ambulatory Visit: Payer: Self-pay | Admitting: Internal Medicine

## 2021-09-29 NOTE — Telephone Encounter (Signed)
Name of Medication: Hydrocodone Name of Pharmacy: [facility] Date and Quantity: 08-11-21 #60 Last Office Visit and Type: 07-22-21 Next Office Visit and Type: 11-04-21 Last Controlled Substance Agreement Date: 09-05-20 Last UDS: 09-05-20

## 2021-09-30 DIAGNOSIS — M62830 Muscle spasm of back: Secondary | ICD-10-CM | POA: Diagnosis not present

## 2021-09-30 DIAGNOSIS — M9903 Segmental and somatic dysfunction of lumbar region: Secondary | ICD-10-CM | POA: Diagnosis not present

## 2021-09-30 DIAGNOSIS — M5416 Radiculopathy, lumbar region: Secondary | ICD-10-CM | POA: Diagnosis not present

## 2021-09-30 DIAGNOSIS — M955 Acquired deformity of pelvis: Secondary | ICD-10-CM | POA: Diagnosis not present

## 2021-10-05 ENCOUNTER — Other Ambulatory Visit: Payer: Self-pay | Admitting: Cardiovascular Disease

## 2021-10-05 ENCOUNTER — Telehealth: Payer: Self-pay | Admitting: Cardiovascular Disease

## 2021-10-05 NOTE — Telephone Encounter (Signed)
*  STAT* If patient is at the pharmacy, call can be transferred to refill team.   1. Which medications need to be refilled? (please list name of each medication and dose if known) Furosemide (LASIX) 40 MG 1 tablet as needed   2. Which pharmacy/location (including street and city if local pharmacy) is medication to be sent to? [place]   3. Do they need a 30 day or 90 day supply? 90 day

## 2021-10-06 MED ORDER — FUROSEMIDE 40 MG PO TABS: 40.0000 mg | ORAL_TABLET | Freq: Every day | ORAL | 0 refills | Status: DC | PRN

## 2021-10-06 NOTE — Telephone Encounter (Signed)
Requested Prescriptions   Signed Prescriptions Disp Refills   furosemide (LASIX) 40 MG tablet 90 tablet 0    Sig: Take 1 tablet (40 mg total) by mouth daily as needed.    Authorizing Provider: GOLLAN, TIMOTHY J    Ordering User: NEWCOMER MCCLAIN, Moe Brier L    

## 2021-11-04 ENCOUNTER — Ambulatory Visit (INDEPENDENT_AMBULATORY_CARE_PROVIDER_SITE_OTHER): Payer: Medicare HMO | Admitting: Internal Medicine

## 2021-11-04 ENCOUNTER — Other Ambulatory Visit: Payer: Self-pay

## 2021-11-04 ENCOUNTER — Encounter: Payer: Self-pay | Admitting: Internal Medicine

## 2021-11-04 VITALS — BP 124/78 | HR 72 | Temp 96.1°F | Ht 64.0 in | Wt 192.4 lb

## 2021-11-04 DIAGNOSIS — E1159 Type 2 diabetes mellitus with other circulatory complications: Secondary | ICD-10-CM | POA: Diagnosis not present

## 2021-11-04 DIAGNOSIS — M25551 Pain in right hip: Secondary | ICD-10-CM

## 2021-11-04 LAB — POCT GLYCOSYLATED HEMOGLOBIN (HGB A1C): Hemoglobin A1C: 7.4 % — AB (ref 4.0–5.6)

## 2021-11-04 LAB — HM DIABETES EYE EXAM

## 2021-11-04 NOTE — Assessment & Plan Note (Signed)
Doesn't seem to be in the hip itself Asked him to try lidocaine patch

## 2021-11-04 NOTE — Assessment & Plan Note (Signed)
Lab Results  Component Value Date   HGBA1C 7.4 (A) 11/04/2021   Markedly better with the trulicity Also invokana and metformin

## 2021-11-04 NOTE — Progress Notes (Signed)
Subjective:    Patient ID: [name] [name], male    DOB: [birth year], 70 y.o.   MRN: [id number]  HPI Here for follow up of diabetes This visit occurred during the SARS-CoV-2 public health emergency.  Safety protocols were in place, including screening questions prior to the visit, additional usage of staff PPE, and extensive cleaning of exam room while observing appropriate contact time as indicated for disinfecting solutions.   Tolerating the injections--dulaglutide Checks sugars once a week or so----110 to 125 No hypoglycemic reactions He is eating mostly the same  Ongoing posterior right hip pain Dr Francisca December has helped other pain---but not that  Current Outpatient Medications on File Prior to Visit  Medication Sig Dispense Refill   albuterol (VENTOLIN HFA) 108 (90 Base) MCG/ACT inhaler Inhale 2 puffs into the lungs every 6 (six) hours as needed for wheezing or shortness of breath. 90 g 0   Ascorbic Acid (VITAMIN C) 1000 MG tablet Take 1,000 mg by mouth daily.     aspirin EC 81 MG tablet Take 1 tablet (81 mg total) by mouth daily. 90 tablet 3   canagliflozin (INVOKANA) 300 MG TABS tablet Take 1 tablet (300 mg total) by mouth daily before breakfast. 90 tablet 3   clopidogrel (PLAVIX) 75 MG tablet TAKE 1 TABLET BY MOUTH ONCE A DAY WITH BREAKFAST 30 tablet 10   Dulaglutide 0.75 MG/0.5ML SOPN Inject 0.75 mg into the skin once a week. 2 mL 5   furosemide (LASIX) 40 MG tablet Take 1 tablet (40 mg total) by mouth daily as needed. 90 tablet 0   glucose blood (FREESTYLE LITE) test strip Use as instructed, patient tests once daily. Dx: 250.00 300 each 3   HYDROcodone-acetaminophen (NORCO/VICODIN) 5-325 MG tablet TAKE 1 TABLET BY MOUTH TWICE (2) DAILY 60 tablet 0   LORazepam (ATIVAN) 0.5 MG tablet Take 0.5-1 tablets (0.25-0.5 mg total) by mouth 2 (two) times daily as needed for anxiety. 30 tablet 0   metFORMIN (GLUCOPHAGE) 1000 MG tablet TAKE 1 TABLET BY MOUTH TWICE (2) DAILY WITH A MEAL 180  tablet 3   nitroGLYCERIN (NITROSTAT) 0.4 MG SL tablet Place 1 tablet (0.4 mg total) under the tongue every 5 (five) minutes as needed for chest pain. 25 tablet 3   potassium chloride SA (K-DUR,KLOR-CON) 20 MEQ tablet Take 1 tablet (20 mEq total) by mouth 2 (two) times daily as needed (Take with lasix). 90 tablet 3   REPATHA PUSHTRONEX SYSTEM 420 MG/3.5ML SOCT INJECT ONE APPLICATOR INTO THE SKIN EVERY 30 DAYS 3.5 mL 2   tamsulosin (FLOMAX) 0.4 MG CAPS capsule Take 1 capsule (0.4 mg total) by mouth daily. 90 capsule 3   triamcinolone cream (KENALOG) 0.1 % Apply 1 application topically 2 (two) times daily as needed. (Patient taking differently: Apply 1 application topically 2 (two) times daily as needed (irritation).) 30 g 0   Zinc 30 MG TABS Take by mouth daily.     lisinopril (ZESTRIL) 20 MG tablet Take 1 tablet (20 mg total) by mouth daily. (Patient not taking: Reported on 11/04/2021) 90 tablet 3   No current facility-administered medications on file prior to visit.    Allergies  Allergen Reactions   Doxazosin Mesylate Other (See Comments)    REACTION: didn't work and may have caused SOB   Tetracyclines & Related    Carvedilol Other (See Comments)    Felt bad   Crestor [Rosuvastatin] Other (See Comments)    Muscle aches.   Doxycycline Other (See Comments)  REACTION: unspecified   Glimepiride Other (See Comments)    REACTION: red eyes   Glipizide Other (See Comments)    REACTION: myalgia??   Losartan Potassium-Hctz Other (See Comments)    REACTION: chest \\T \ leg pain   Metoprolol Tartrate Other (See Comments)    Mood swings, memory changes   Pravastatin Other (See Comments)    Muscle aches    Rosiglitazone Maleate Other (See Comments)    REACTION: myalgia   Simvastatin Other (See Comments)    REACTION: myalgias    Past Medical History:  Diagnosis Date   Angina    Arthritis    BPH (benign prostatic hypertrophy)    Carpal tunnel syndrome    Coronary artery disease    a.  11/2011 s/p CABG x 3  (LIMA-LAD, SeqSVG-D1-D3); b. 09/2019 CATH-RCA PCI: LM 50ost, LAD 100ost/p, Sev diff diag dzs, LCX nl, OM1 27m, OM3 40, RCA 70ost (iFR 0.88--> 3.0x26 Resolute Onyx DES), 48m, LIMA->LAD ok. VG->D1->D3 ok. EF 50-55%.; c) 10/01/2019: staged PCI Ost LM (Resolute Onyx 3.5 x 12 - 4.2 mm) & Ost OM1 (Resolute Onyx 2.25 x 26 -- 2.5 mm)   GERD (gastroesophageal reflux disease)    Hx of colonic polyps    Hyperlipidemia    Hypertension    Kidney stones    Morbid obesity (HCC)    OSA (obstructive sleep apnea)    uses cpap   Type II diabetes mellitus ([place])     Past Surgical History:  Procedure Laterality Date   CARDIAC CATHETERIZATION     CORONARY ARTERY BYPASS GRAFT  11/12/2011   Procedure: CORONARY ARTERY BYPASS GRAFTING (CABG);  Surgeon: Grace Isaac, MD;  Location: [place];  Service: Open Heart Surgery;  Laterality: N/A;  Coronary Artery Bypass Graft times three on pump utilizing left internal mammary artery and right saphenous vein harvested endoscopically    CORONARY STENT INTERVENTION N/A 09/27/2019   Procedure: CORONARY STENT INTERVENTION;  Surgeon: Wellington Hampshire, MD;  Location: [facility];  Service: Cardiovascular;  Laterality: N/A;   CORONARY STENT INTERVENTION N/A 10/01/2019   Procedure: CORONARY STENT INTERVENTION;  Surgeon: Nelva Bush, MD;  Location: [place] [facility];  Service: Cardiovascular;  Laterality: N/A;   HERNIA REPAIR     INTRAVASCULAR PRESSURE WIRE/FFR STUDY N/A 09/27/2019   Procedure: INTRAVASCULAR PRESSURE WIRE/FFR STUDY;  Surgeon: Wellington Hampshire, MD;  Location: [place] [facility];  Service: Cardiovascular;  Laterality: N/A;   INTRAVASCULAR ULTRASOUND/IVUS N/A 10/01/2019   Procedure: Intravascular Ultrasound/IVUS;  Surgeon: Nelva Bush, MD;  Location: [facility];  Service: Cardiovascular;  Laterality: N/A;   KNEE ARTHROPLASTY Right 11/23/2017   Procedure: COMPUTER ASSISTED TOTAL KNEE ARTHROPLASTY;  Surgeon: Dereck Leep, MD;  Location: [facility];  Service: Orthopedics;  Laterality: Right;   KNEE SURGERY     left   LEFT HEART CATH AND CORONARY ANGIOGRAPHY Left 09/27/2019   Procedure: LEFT HEART CATH AND CORONARY ANGIOGRAPHY;  Surgeon: Minna Merritts, MD;  Location: [place] [facility];  Service: Cardiovascular;  Laterality: Left;   LEFT HEART CATH AND CORONARY ANGIOGRAPHY N/A 10/01/2019   Procedure: LEFT HEART CATH AND CORONARY ANGIOGRAPHY;  Surgeon: Nelva Bush, MD;  Location: [place] [facility];  Service: Cardiovascular;  Laterality: N/A;   MENISCUS REPAIR  03/08   right knee    Family History  Problem Relation Age of Onset   Coronary artery disease Father    Prostate cancer Father    Diabetes Neg Hx  Hypertension Neg Hx     Social History   Socioeconomic History   Marital status: Married    Spouse name: Not on file   Number of children: 3   Years of education: Not on file   Highest education level: Not on file  Occupational History   Occupation: Surveyor, 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  Tobacco Use   Smoking status: Never   Smokeless tobacco: Never  Scientific laboratory technician Use: Never used  Substance and Sexual Activity   Alcohol use: No   Drug use: No   Sexual activity: Yes  Other Topics Concern   Not on file  Social History Narrative   No living will   Requests wife as health care POA   Would accept resuscitation   Would accept tube feedings   Social Determinants of Health   Financial Resource Strain: Not on file  Food Insecurity: Not on file  Transportation Needs: Not on file  Physical Activity: Not on file  Stress: Not on file  Social Connections: Not on file  Intimate Partner Violence: Not on file    Review of Systems No nausea, indigestion, diarrhea Bowels are actually slow Gets rare "tick" of chest pain--hasn't needed nitro    Objective:   Physical Exam Constitutional:      Appearance: Normal appearance.   Musculoskeletal:     Comments: No specific hip or back tenderness Right hip is stiff (and knee)---but rotation is only mildly limited  Neurological:     Mental Status: He is alert.           Assessment & Plan:

## 2021-11-06 ENCOUNTER — Other Ambulatory Visit: Payer: Self-pay | Admitting: Internal Medicine

## 2021-11-06 NOTE — Telephone Encounter (Signed)
Name of Medication: Hydrocodone Name of Pharmacy: [facility] or Written Date and Quantity: 09-28-21 #60 Last Office Visit and Type: 11-04-21 Next Office Visit and Type: 03-30-22 Last Controlled Substance Agreement Date: 09-05-20 Last UDS: 09-05-20

## 2021-11-10 DIAGNOSIS — Z0100 Encounter for examination of eyes and vision without abnormal findings: Secondary | ICD-10-CM | POA: Diagnosis not present

## 2021-11-17 ENCOUNTER — Telehealth: Payer: Self-pay | Admitting: Cardiovascular Disease

## 2021-11-17 DIAGNOSIS — M533 Sacrococcygeal disorders, not elsewhere classified: Secondary | ICD-10-CM | POA: Diagnosis not present

## 2021-11-17 DIAGNOSIS — M955 Acquired deformity of pelvis: Secondary | ICD-10-CM | POA: Diagnosis not present

## 2021-11-17 DIAGNOSIS — Z96651 Presence of right artificial knee joint: Secondary | ICD-10-CM | POA: Diagnosis not present

## 2021-11-17 DIAGNOSIS — M5416 Radiculopathy, lumbar region: Secondary | ICD-10-CM | POA: Diagnosis not present

## 2021-11-17 DIAGNOSIS — M62830 Muscle spasm of back: Secondary | ICD-10-CM | POA: Diagnosis not present

## 2021-11-17 DIAGNOSIS — M9903 Segmental and somatic dysfunction of lumbar region: Secondary | ICD-10-CM | POA: Diagnosis not present

## 2021-11-17 NOTE — Telephone Encounter (Signed)
-----   Message from Horton Finer sent at 11/17/2021  8:10 AM EST ----- Regarding: needs appt Please schedule 12 month F/U appointment. Thank you!

## 2021-11-17 NOTE — Telephone Encounter (Signed)
LVM to schedule appt

## 2021-11-18 ENCOUNTER — Telehealth: Payer: Self-pay

## 2021-11-18 NOTE — Telephone Encounter (Signed)
Started a PA through covermymeds for [employer].   [name] [name] (Key: [id number]) Repatha SureClick 140MG /ML auto-injectors  After completion I received this message: "Authorization already on file for this request. Authorization starting on 12/06/2020 and ending on 12/05/2022."

## 2021-11-19 DIAGNOSIS — M955 Acquired deformity of pelvis: Secondary | ICD-10-CM | POA: Diagnosis not present

## 2021-11-19 DIAGNOSIS — M62830 Muscle spasm of back: Secondary | ICD-10-CM | POA: Diagnosis not present

## 2021-11-19 DIAGNOSIS — M5416 Radiculopathy, lumbar region: Secondary | ICD-10-CM | POA: Diagnosis not present

## 2021-11-19 DIAGNOSIS — M9903 Segmental and somatic dysfunction of lumbar region: Secondary | ICD-10-CM | POA: Diagnosis not present

## 2021-11-24 DIAGNOSIS — M955 Acquired deformity of pelvis: Secondary | ICD-10-CM | POA: Diagnosis not present

## 2021-11-24 DIAGNOSIS — M62830 Muscle spasm of back: Secondary | ICD-10-CM | POA: Diagnosis not present

## 2021-11-24 DIAGNOSIS — M5416 Radiculopathy, lumbar region: Secondary | ICD-10-CM | POA: Diagnosis not present

## 2021-11-24 DIAGNOSIS — M9903 Segmental and somatic dysfunction of lumbar region: Secondary | ICD-10-CM | POA: Diagnosis not present

## 2021-11-25 DIAGNOSIS — M533 Sacrococcygeal disorders, not elsewhere classified: Secondary | ICD-10-CM | POA: Diagnosis not present

## 2021-11-25 DIAGNOSIS — M25551 Pain in right hip: Secondary | ICD-10-CM | POA: Diagnosis not present

## 2021-12-02 ENCOUNTER — Ambulatory Visit: Payer: Medicare HMO | Admitting: Internal Medicine

## 2021-12-02 ENCOUNTER — Other Ambulatory Visit: Payer: Self-pay

## 2021-12-02 ENCOUNTER — Encounter: Payer: Self-pay | Admitting: Internal Medicine

## 2021-12-02 DIAGNOSIS — G4733 Obstructive sleep apnea (adult) (pediatric): Secondary | ICD-10-CM | POA: Diagnosis not present

## 2021-12-02 DIAGNOSIS — M5416 Radiculopathy, lumbar region: Secondary | ICD-10-CM | POA: Diagnosis not present

## 2021-12-02 DIAGNOSIS — M955 Acquired deformity of pelvis: Secondary | ICD-10-CM | POA: Diagnosis not present

## 2021-12-02 DIAGNOSIS — M9903 Segmental and somatic dysfunction of lumbar region: Secondary | ICD-10-CM | POA: Diagnosis not present

## 2021-12-02 DIAGNOSIS — I25118 Atherosclerotic heart disease of native coronary artery with other forms of angina pectoris: Secondary | ICD-10-CM | POA: Diagnosis not present

## 2021-12-02 DIAGNOSIS — M62830 Muscle spasm of back: Secondary | ICD-10-CM | POA: Diagnosis not present

## 2021-12-02 NOTE — Patient Instructions (Signed)
We can send for mask fitting if needed.  We can continue CPAP auto 5-15.  Please call if we can help.

## 2021-12-02 NOTE — Assessment & Plan Note (Signed)
He benefits from CPAP with good compliance and control. Plan-continue auto 5-15

## 2021-12-02 NOTE — Assessment & Plan Note (Signed)
He continues cardiology follow-up no new concerns reported

## 2021-12-02 NOTE — Progress Notes (Signed)
Subjective:    Patient ID: [name] [name], male    DOB: March 18, 1951, 70 y.o.   MRN: [id number]  HPI male never smoker followed for OSA, complicated by DM 2, CAD/ CABG, HBP, GERD, chronic pain/narcotic dependence, hyperlipidemia, BPH NPSG 1998:  AHI 58/hr.  --------------------------------------------------------------------------   12/02/20- 70 year old male never smoker followed for OSA, complicated by DM 2, CAD/ CABG, HBP, GERD, chronic pain/narcotic dependence, hyperlipidemia, BPH, CPAP 10/[employer] -----1 year f/u OSA.patient stated breathing is not good. SOB sitting,walking .  Had normal spirometry in 2015. Has Ventolin HFA.  Not anemic. Body weight today 206 lbs No download today                Machine is 71 years old.  Noting shortness of breath with rest and exertion over past couple of months. Some better since cardiac stents 2 months ago. Ventolin no help. Little cough or wheeze. Has been sneezing more- blames cold air.  CXR 09/30/2019-  The heart remains enlarged. Median sternotomy wires are redemonstrated. The lungs are clear. There is no pleural effusion or pneumothorax. Degenerative changes are seen in the spine.  IMPRESSION: No active cardiopulmonary disease.   12/02/21- 70 year old male never smoker followed for OSA, complicated by DM 2, CAD/ CABG, HBP, GERD, chronic pain/narcotic dependence, hyperlipidemia, BPH, CPAP 5-15 auto [employer] Download- compliance 100%, AHI 2.6/ hr     Air[employer]ense 10 autoset Body weight today-196 lbs Covid vax-none Flu vax-none He mentions a couple of episodes when he dreamt he was smothering despite his CPAP, but didn't wake up.  This has not happened recently.  We talked about it but on review of his download, control looks quite good.  We agreed to leave things as they are.  No other interval concerns.   ROS-see HPI   + = positive Constitutional:    weight loss, night sweats, fevers, chills, fatigue, lassitude. HEENT:    headaches,  difficulty swallowing, tooth/dental problems, sore throat,       +sneezing, itching, ear ache, +nasal congestion, post nasal drip, snoring CV:    chest pain, orthopnea, PND, swelling in lower extremities, anasarca,                                                   dizziness, palpitations Resp:   shortness of breath with exertion or at rest.                productive cough,   non-productive cough, coughing up of blood.              change in color of mucus.  wheezing.   Skin:    rash or lesions. GI:  No-   heartburn, indigestion, abdominal pain, nausea, vomiting, diarrhea,                 change in bowel habits, loss of appetite GU: dysuria, change in color of urine, no urgency or frequency.   flank pain. MS:   joint pain, stiffness, decreased range of motion, back pain. Neuro-     nothing unusual Psych:  change in mood or affect.  depression or anxiety.   memory loss.    Objective:  OBJ- Physical Exam General- Alert, Oriented, Affect-appropriate, Distress- none acute, + Full beard,  Skin- rash-none, lesions- none, excoriation- none Lymphadenopathy- none Head- atraumatic  Eyes- Gross vision intact, PERRLA, conjunctivae and secretions clear            Ears- Hearing, canals-normal            Nose- Clear, no-Septal dev, mucus, polyps, erosion, perforation             Throat- Mallampati II-III , mucosa clear , drainage- none, tonsils- atrophic Neck- flexible , trachea midline, no stridor , thyroid nl, carotid no bruit Chest - symmetrical excursion , unlabored           Heart/CV- RRR , no murmur , no gallop  , no rub, nl s1 s2                           - JVD- none , edema- none, stasis changes- none, varices- none           Lung- clear to P&A, wheeze- none, cough- none , dullness-none, rub- none           Chest wall-  Abd-  Br/ Gen/ Rectal- Not done, not indicated Extrem- cyanosis- none, clubbing, none, atrophy- none, strength- nl Neuro- grossly intact to observation     Assessment & Plan:

## 2021-12-04 DIAGNOSIS — M5416 Radiculopathy, lumbar region: Secondary | ICD-10-CM | POA: Diagnosis not present

## 2021-12-04 DIAGNOSIS — M62830 Muscle spasm of back: Secondary | ICD-10-CM | POA: Diagnosis not present

## 2021-12-04 DIAGNOSIS — M955 Acquired deformity of pelvis: Secondary | ICD-10-CM | POA: Diagnosis not present

## 2021-12-04 DIAGNOSIS — M9903 Segmental and somatic dysfunction of lumbar region: Secondary | ICD-10-CM | POA: Diagnosis not present

## 2021-12-04 NOTE — Addendum Note (Signed)
Addended by: Elby Beck R on: 12/04/2021 10:54 AM   Modules accepted: Orders

## 2021-12-08 DIAGNOSIS — M5416 Radiculopathy, lumbar region: Secondary | ICD-10-CM | POA: Diagnosis not present

## 2021-12-08 DIAGNOSIS — M955 Acquired deformity of pelvis: Secondary | ICD-10-CM | POA: Diagnosis not present

## 2021-12-08 DIAGNOSIS — M9903 Segmental and somatic dysfunction of lumbar region: Secondary | ICD-10-CM | POA: Diagnosis not present

## 2021-12-08 DIAGNOSIS — M62830 Muscle spasm of back: Secondary | ICD-10-CM | POA: Diagnosis not present

## 2021-12-10 DIAGNOSIS — M5416 Radiculopathy, lumbar region: Secondary | ICD-10-CM | POA: Diagnosis not present

## 2021-12-10 DIAGNOSIS — M955 Acquired deformity of pelvis: Secondary | ICD-10-CM | POA: Diagnosis not present

## 2021-12-10 DIAGNOSIS — M62830 Muscle spasm of back: Secondary | ICD-10-CM | POA: Diagnosis not present

## 2021-12-10 DIAGNOSIS — M9903 Segmental and somatic dysfunction of lumbar region: Secondary | ICD-10-CM | POA: Diagnosis not present

## 2021-12-15 DIAGNOSIS — M9903 Segmental and somatic dysfunction of lumbar region: Secondary | ICD-10-CM | POA: Diagnosis not present

## 2021-12-15 DIAGNOSIS — M5416 Radiculopathy, lumbar region: Secondary | ICD-10-CM | POA: Diagnosis not present

## 2021-12-15 DIAGNOSIS — M955 Acquired deformity of pelvis: Secondary | ICD-10-CM | POA: Diagnosis not present

## 2021-12-15 DIAGNOSIS — M62830 Muscle spasm of back: Secondary | ICD-10-CM | POA: Diagnosis not present

## 2021-12-17 DIAGNOSIS — M5416 Radiculopathy, lumbar region: Secondary | ICD-10-CM | POA: Diagnosis not present

## 2021-12-17 DIAGNOSIS — M955 Acquired deformity of pelvis: Secondary | ICD-10-CM | POA: Diagnosis not present

## 2021-12-17 DIAGNOSIS — M9903 Segmental and somatic dysfunction of lumbar region: Secondary | ICD-10-CM | POA: Diagnosis not present

## 2021-12-17 DIAGNOSIS — M62830 Muscle spasm of back: Secondary | ICD-10-CM | POA: Diagnosis not present

## 2021-12-18 ENCOUNTER — Other Ambulatory Visit: Payer: Self-pay | Admitting: Physician Assistant

## 2021-12-18 ENCOUNTER — Other Ambulatory Visit: Payer: Self-pay | Admitting: Internal Medicine

## 2021-12-18 NOTE — Telephone Encounter (Signed)
Name of Medication: Hydrocodone Name of Pharmacy: [facility] or Written Date and Quantity: 11-06-21 #60 Last Office Visit and Type: 11-04-21 Next Office Visit and Type: 03-30-22 Last Controlled Substance Agreement Date: 09-05-20 Last UDS: 09-05-20

## 2021-12-21 NOTE — Progress Notes (Signed)
Cardiology Office Note  Date:  12/22/2021   ID:  [name] [name], DOB 1951/05/20, MRN [id number]  PCP:  Venia Carbon, MD   Chief Complaint  Patient presents with   12 month follow up     Patient c/o twinges in chest at times. Medications reviewed by the patient verbally.     HPI:  71 year-old gentleman with  obesity,  hyperlipidemia,  diabetes,  hypertension,  CAD, s/p CABG.  severe LAD disease, bypass surgery November 12 2011.  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  In summary reports doing relatively well Denies any chest pain on exertion Rare twinges in his chest, atypical in nature  Reports having some falls while out on the farm Hurt his back, tried a cortisone, did not seem to help  Exercise limited secondary to back pain  Labs reviewed HBA1C 7.4 Total chol 200, reports he is back on his once monthly Repatha  EKG personally reviewed by myself on todays visit nsr rate 64 IVCD nonspecific ST abnormality  Prior records reviewed Cath , Three-vessel coronary artery disease, including 60% ostial/proximal LMCA stenosis (MLA 5.4 mm^2 by IVUS), chronic total occlusion of the proximal LAD (known patent LIMA-LAD and SVG-D1 and D2), 90% OM1 stenosis, and 50% mid/distal RCA stenosis. Widely patent proximal RCA stent. Normal left ventricular filling pressure. Successful IVUS-guided PCI to the ostial through mid LMCA using Resolute Onyx 3.5 12 mm drug-eluting stent (postdilated to 4.2 mm) with 0% residual stenosis and TIMI-3 flow. Successful PCI to OM1 using Resolute Onyx  2.25 x 26 mm drug-eluting stent with 0% residual stenosis and TIMI-3 flow.  unable to tolerate a statin including Crestor, Lipitor, simvastatin., zetia Myalgias, "inflammation"   Medication intolerances  unable to tolerate metoprolol secondary to memory problems and mood disorder. Unable to tolerate carvedilol secondary to fatigue   Previous  Echocardiogram  for his shortness of breath showed normal function, no significant pulmonary hypertension. Normal right ventricular systolic pressure.  PMH:   has a past medical history of Angina, Arthritis, BPH (benign prostatic hypertrophy), Carpal tunnel syndrome, Coronary artery disease, GERD (gastroesophageal reflux disease), colonic polyps, Hyperlipidemia, Hypertension, Kidney stones, Morbid obesity ([place]), OSA (obstructive sleep apnea), and Type II diabetes mellitus ([place]).  PSH:    Past Surgical History:  Procedure Laterality Date   CARDIAC CATHETERIZATION     CORONARY ARTERY BYPASS GRAFT  11/12/2011   Procedure: CORONARY ARTERY BYPASS GRAFTING (CABG);  Surgeon: Grace Isaac, MD;  Location: [facility];  Service: Open Heart Surgery;  Laterality: N/A;  Coronary Artery Bypass Graft times three on pump utilizing left internal mammary artery and right saphenous vein harvested endoscopically    CORONARY STENT INTERVENTION N/A 09/27/2019   Procedure: CORONARY STENT INTERVENTION;  Surgeon: Wellington Hampshire, MD;  Location: [facility];  Service: Cardiovascular;  Laterality: N/A;   CORONARY STENT INTERVENTION N/A 10/01/2019   Procedure: CORONARY STENT INTERVENTION;  Surgeon: Nelva Bush, MD;  Location: [facility];  Service: Cardiovascular;  Laterality: N/A;   HERNIA REPAIR     INTRAVASCULAR PRESSURE WIRE/FFR STUDY N/A 09/27/2019   Procedure: INTRAVASCULAR PRESSURE WIRE/FFR STUDY;  Surgeon: Wellington Hampshire, MD;  Location: [facility];  Service: Cardiovascular;  Laterality: N/A;   INTRAVASCULAR ULTRASOUND/IVUS N/A 10/01/2019    Procedure: Intravascular Ultrasound/IVUS;  Surgeon: Nelva Bush, MD;  Location: [facility];  Service: Cardiovascular;  Laterality: N/A;   KNEE ARTHROPLASTY Right 11/23/2017   Procedure: COMPUTER ASSISTED TOTAL KNEE ARTHROPLASTY;  Surgeon: Dereck Leep, MD;  Location: [facility];  Service: Orthopedics;  Laterality: Right;   KNEE SURGERY     left   LEFT HEART CATH AND CORONARY ANGIOGRAPHY Left 09/27/2019   Procedure: LEFT HEART CATH AND CORONARY ANGIOGRAPHY;  Surgeon: Minna Merritts, MD;  Location: [facility];  Service: Cardiovascular;  Laterality: Left;   LEFT HEART CATH AND CORONARY ANGIOGRAPHY N/A 10/01/2019   Procedure: LEFT HEART CATH AND CORONARY ANGIOGRAPHY;  Surgeon: Nelva Bush, MD;  Location: [facility];  Service: Cardiovascular;  Laterality: N/A;   MENISCUS REPAIR  03/08   right knee    Current Outpatient Medications  Medication Sig Dispense Refill   Ascorbic Acid (VITAMIN C) 1000 MG tablet Take 1,000 mg by mouth daily.     aspirin EC 81 MG tablet Take 1 tablet (81 mg total) by mouth daily. 90 tablet 3   canagliflozin (INVOKANA) 300 MG TABS tablet Take 1 tablet (300 mg total) by mouth daily before breakfast. 90 tablet 3   Dulaglutide 0.75 MG/0.5ML SOPN Inject 0.75 mg into the skin once a week. 2 mL 5   furosemide (LASIX) 40 MG tablet Take 1 tablet (40 mg total) by mouth daily as needed. 90 tablet 0   glucose blood (FREESTYLE LITE) test strip Use as instructed, patient tests once daily. Dx: 250.00 300 each 3   HYDROcodone-acetaminophen (NORCO/VICODIN) 5-325 MG tablet TAKE 1 TABLET BY MOUTH TWICE (2) DAILY 60 tablet 0   LORazepam (ATIVAN) 0.5 MG tablet Take 0.5-1 tablets (0.25-0.5 mg total) by mouth 2 (two) times daily as needed for anxiety. 30 tablet 0   metFORMIN (GLUCOPHAGE) 1000 MG tablet TAKE 1 TABLET BY MOUTH TWICE (2) DAILY WITH A MEAL 180 tablet 3   nitroGLYCERIN (NITROSTAT) 0.4 MG SL tablet Place 1 tablet (0.4 mg total) under the  tongue every 5 (five) minutes as needed for chest pain. 25 tablet 3   potassium chloride SA (K-DUR,KLOR-CON) 20 MEQ tablet Take 1 tablet (20 mEq total) by mouth 2 (two) times daily as needed (Take with lasix). 90 tablet 3   tamsulosin (FLOMAX) 0.4 MG CAPS capsule Take 1 capsule (0.4 mg total) by mouth daily. 90 capsule 3   triamcinolone cream (KENALOG) 0.1 % Apply 1 application topically 2 (two) times daily as needed. (Patient taking differently: Apply 1 application topically 2 (two) times daily as needed (irritation).) 30 g 0   Zinc 30 MG TABS Take by mouth daily.     albuterol (VENTOLIN HFA) 108 (90 Base) MCG/ACT inhaler Inhale 2 puffs into the lungs every 6 (six) hours as needed for wheezing or shortness of breath. (Patient not taking: Reported on 12/02/2021) 90 g 0   clopidogrel (PLAVIX) 75 MG tablet Take 1 tablet (75 mg total) by mouth daily with breakfast. 90 tablet 3   Evolocumab with Infusor (REPATHA PUSHTRONEX SYSTEM) 420 MG/3.5ML SOCT INJECT ONE APPLICATOR INTO THE SKIN EVERY 30 DAYS 3.5 mL 11   lisinopril (ZESTRIL) 20 MG tablet Take 1 tablet (20 mg total) by mouth daily. 90 tablet 3   No current facility-administered medications for this visit.     Allergies:  Doxazosin mesylate, Tetracyclines & related, Carvedilol, Crestor [rosuvastatin], Doxycycline, Glimepiride, Glipizide, Losartan potassium-hctz, Metoprolol tartrate, Pravastatin, Rosiglitazone maleate, Simvastatin, and Tetracycline   Social History:  The patient  reports that he has never smoked. He has never used smokeless tobacco. He reports that he does not drink alcohol and does not use drugs.   Family History:   family history includes Coronary artery disease in his father; Prostate cancer in his father.    Review of Systems: Review of Systems  Constitutional: Negative.   HENT: Negative.    Respiratory: Negative.    Cardiovascular: Negative.   Gastrointestinal: Negative.   Musculoskeletal:  Positive for back pain and  joint pain.  Neurological: Negative.   Psychiatric/Behavioral: Negative.    All other systems reviewed and are negative.  PHYSICAL EXAM: VS:  BP 130/80 (BP Location: Left Arm, Patient Position: Sitting, Cuff Size: Normal)    Pulse 64    Ht 5\' 5"  (1.651 m)    Wt 198 lb 8 oz (90 kg)    SpO2 98%    BMI 33.03 kg/m  , BMI Body mass index is 33.03 kg/m. Constitutional:  oriented to person, place, and time. No distress.  HENT:  Head: Grossly normal Eyes:  no discharge. No scleral icterus.  Neck: No JVD, no carotid bruits  Cardiovascular: Regular rate and rhythm, no murmurs appreciated Pulmonary/Chest: Clear to auscultation bilaterally, no wheezes or rails Abdominal: Soft.  no distension.  no tenderness.  Musculoskeletal: Normal range of motion Neurological:  normal muscle tone. Coordination normal. No atrophy Skin: Skin warm and dry Psychiatric: normal affect, pleasant  Recent Labs: No results found for requested labs within last 8760 hours.    Lipid Panel Lab Results  Component Value Date   CHOL 200 12/19/2020   HDL 48.30 12/19/2020   LDLCALC 115 (H) 12/19/2020   TRIG 185.0 (H) 12/19/2020      Wt Readings from Last 3 Encounters:  12/22/21 198 lb 8 oz (90 kg)  12/02/21 196 lb 3.2 oz (89 kg)  11/04/21 192 lb 6 oz (87.3 kg)     ASSESSMENT AND PLAN:  Essential hypertension -  Blood pressure is well controlled on today's visit. No changes made to the medications.  Coronary artery disease involving native coronary artery of native heart without angina pectoris -  Some atypical prickling in the chest but denies angina No further workup at this time. Continue current medication regimen. 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 12/22/2021  [place], [place]  \

## 2021-12-22 ENCOUNTER — Ambulatory Visit: Payer: Medicare HMO | Admitting: Cardiovascular Disease

## 2021-12-22 ENCOUNTER — Other Ambulatory Visit: Payer: Self-pay

## 2021-12-22 ENCOUNTER — Encounter: Payer: Self-pay | Admitting: Cardiovascular Disease

## 2021-12-22 VITALS — BP 130/80 | HR 64 | Ht 65.0 in | Wt 198.5 lb

## 2021-12-22 DIAGNOSIS — I10 Essential (primary) hypertension: Secondary | ICD-10-CM | POA: Diagnosis not present

## 2021-12-22 DIAGNOSIS — Z951 Presence of aortocoronary bypass graft: Secondary | ICD-10-CM | POA: Diagnosis not present

## 2021-12-22 DIAGNOSIS — T466X5A Adverse effect of antihyperlipidemic and antiarteriosclerotic drugs, initial encounter: Secondary | ICD-10-CM

## 2021-12-22 DIAGNOSIS — I25118 Atherosclerotic heart disease of native coronary artery with other forms of angina pectoris: Secondary | ICD-10-CM

## 2021-12-22 DIAGNOSIS — G4733 Obstructive sleep apnea (adult) (pediatric): Secondary | ICD-10-CM

## 2021-12-22 DIAGNOSIS — E785 Hyperlipidemia, unspecified: Secondary | ICD-10-CM | POA: Diagnosis not present

## 2021-12-22 DIAGNOSIS — M791 Myalgia, unspecified site: Secondary | ICD-10-CM | POA: Diagnosis not present

## 2021-12-22 DIAGNOSIS — Z9989 Dependence on other enabling machines and devices: Secondary | ICD-10-CM

## 2021-12-22 DIAGNOSIS — R0600 Dyspnea, unspecified: Secondary | ICD-10-CM | POA: Diagnosis not present

## 2021-12-22 MED ORDER — REPATHA PUSHTRONEX SYSTEM 420 MG/3.5ML ~~LOC~~ SOCT: SUBCUTANEOUS | 11 refills | Status: DC

## 2021-12-22 MED ORDER — CLOPIDOGREL BISULFATE 75 MG PO TABS: 75.0000 mg | ORAL_TABLET | Freq: Every day | ORAL | 3 refills | Status: DC

## 2021-12-22 MED ORDER — LISINOPRIL 20 MG PO TABS: 20.0000 mg | ORAL_TABLET | Freq: Every day | ORAL | 3 refills | Status: DC

## 2021-12-22 NOTE — Patient Instructions (Signed)
Medication Instructions:  No changes  If you need a refill on your cardiac medications before your next appointment, please call your pharmacy.   Lab work: No new labs needed  Testing/Procedures: No new testing needed  Follow-Up: At [facility], you and your health needs are our priority.  As part of our continuing mission to provide you with exceptional heart care, we have created designated Provider Care Teams.  These Care Teams include your primary Cardiologist (physician) and Advanced Practice Providers (APPs -  Physician Assistants and Nurse Practitioners) who all work together to provide you with the care you need, when you need it.  You will need a follow up appointment in 12 months  Providers on your designated Care Team:   Christopher Berge, NP Ryan Dunn, PA-C Cadence Furth, PA-C  COVID-19 Vaccine Information can be found at: [email or web address] For questions related to vaccine distribution or appointments, please email [email or web address] or call [phone].   

## 2021-12-29 ENCOUNTER — Encounter: Payer: Medicare Other | Admitting: Internal Medicine

## 2021-12-29 ENCOUNTER — Other Ambulatory Visit (HOSPITAL_BASED_OUTPATIENT_CLINIC_OR_DEPARTMENT_OTHER): Payer: Medicare HMO | Admitting: Internal Medicine

## 2021-12-31 DIAGNOSIS — E118 Type 2 diabetes mellitus with unspecified complications: Secondary | ICD-10-CM | POA: Diagnosis not present

## 2021-12-31 DIAGNOSIS — G8929 Other chronic pain: Secondary | ICD-10-CM | POA: Diagnosis not present

## 2021-12-31 DIAGNOSIS — M533 Sacrococcygeal disorders, not elsewhere classified: Secondary | ICD-10-CM | POA: Diagnosis not present

## 2021-12-31 DIAGNOSIS — M545 Low back pain, unspecified: Secondary | ICD-10-CM | POA: Diagnosis not present

## 2021-12-31 DIAGNOSIS — M25551 Pain in right hip: Secondary | ICD-10-CM | POA: Diagnosis not present

## 2022-01-12 ENCOUNTER — Other Ambulatory Visit: Payer: Self-pay | Admitting: Internal Medicine

## 2022-01-25 ENCOUNTER — Other Ambulatory Visit: Payer: Self-pay | Admitting: Internal Medicine

## 2022-01-25 NOTE — Telephone Encounter (Signed)
Name of Medication: Hydrocodone Name of Pharmacy: [facility] or Written Date and Quantity: 12-18-21 #60 Last Office Visit and Type: 11-04-21 Next Office Visit and Type: 03-30-22 Last Controlled Substance Agreement Date: 09-05-20 Last UDS: 09-05-20

## 2022-02-01 ENCOUNTER — Other Ambulatory Visit: Payer: Self-pay | Admitting: Internal Medicine

## 2022-02-04 ENCOUNTER — Other Ambulatory Visit: Payer: Self-pay | Admitting: Orthopedic Surgery

## 2022-02-04 DIAGNOSIS — M545 Low back pain, unspecified: Secondary | ICD-10-CM

## 2022-02-04 DIAGNOSIS — G8929 Other chronic pain: Secondary | ICD-10-CM

## 2022-02-15 ENCOUNTER — Other Ambulatory Visit: Payer: Self-pay

## 2022-02-15 ENCOUNTER — Ambulatory Visit
Admission: RE | Admit: 2022-02-15 | Discharge: 2022-02-15 | Disposition: A | Discharge status: Home / Self Care | Payer: Medicare HMO | Source: Ambulatory Visit | Attending: Orthopedic Surgery | Admitting: Orthopedic Surgery

## 2022-02-15 ENCOUNTER — Other Ambulatory Visit: Payer: Self-pay | Admitting: Orthopedic Surgery

## 2022-02-15 DIAGNOSIS — Z1389 Encounter for screening for other disorder: Secondary | ICD-10-CM

## 2022-02-15 DIAGNOSIS — G8929 Other chronic pain: Secondary | ICD-10-CM | POA: Diagnosis not present

## 2022-02-15 DIAGNOSIS — Z01818 Encounter for other preprocedural examination: Secondary | ICD-10-CM | POA: Diagnosis not present

## 2022-02-15 DIAGNOSIS — M545 Low back pain, unspecified: Secondary | ICD-10-CM | POA: Insufficient documentation

## 2022-02-15 DIAGNOSIS — M5127 Other intervertebral disc displacement, lumbosacral region: Secondary | ICD-10-CM | POA: Diagnosis not present

## 2022-02-15 DIAGNOSIS — M47817 Spondylosis without myelopathy or radiculopathy, lumbosacral region: Secondary | ICD-10-CM | POA: Diagnosis not present

## 2022-02-19 DIAGNOSIS — G8929 Other chronic pain: Secondary | ICD-10-CM | POA: Diagnosis not present

## 2022-02-19 DIAGNOSIS — M9973 Connective tissue and disc stenosis of intervertebral foramina of lumbar region: Secondary | ICD-10-CM | POA: Diagnosis not present

## 2022-02-19 DIAGNOSIS — M5441 Lumbago with sciatica, right side: Secondary | ICD-10-CM | POA: Diagnosis not present

## 2022-02-23 ENCOUNTER — Other Ambulatory Visit: Payer: Self-pay | Admitting: Internal Medicine

## 2022-02-26 DIAGNOSIS — H11153 Pinguecula, bilateral: Secondary | ICD-10-CM | POA: Diagnosis not present

## 2022-02-26 DIAGNOSIS — E113312 Type 2 diabetes mellitus with moderate nonproliferative diabetic retinopathy with macular edema, left eye: Secondary | ICD-10-CM | POA: Diagnosis not present

## 2022-02-26 DIAGNOSIS — H10413 Chronic giant papillary conjunctivitis, bilateral: Secondary | ICD-10-CM | POA: Diagnosis not present

## 2022-02-26 DIAGNOSIS — H25813 Combined forms of age-related cataract, bilateral: Secondary | ICD-10-CM | POA: Diagnosis not present

## 2022-02-26 DIAGNOSIS — H40053 Ocular hypertension, bilateral: Secondary | ICD-10-CM | POA: Diagnosis not present

## 2022-03-03 ENCOUNTER — Other Ambulatory Visit: Payer: Self-pay | Admitting: Internal Medicine

## 2022-03-03 NOTE — Telephone Encounter (Signed)
Is this okay to refill  ?Last filled 01/25/22 ?Last OV 07/22/21 ?Next OV 03/30/22 ?

## 2022-03-09 DIAGNOSIS — M5126 Other intervertebral disc displacement, lumbar region: Secondary | ICD-10-CM | POA: Diagnosis not present

## 2022-03-09 DIAGNOSIS — M5416 Radiculopathy, lumbar region: Secondary | ICD-10-CM | POA: Diagnosis not present

## 2022-03-30 ENCOUNTER — Encounter: Payer: Self-pay | Admitting: Internal Medicine

## 2022-03-30 ENCOUNTER — Ambulatory Visit (INDEPENDENT_AMBULATORY_CARE_PROVIDER_SITE_OTHER): Payer: Medicare HMO | Admitting: Internal Medicine

## 2022-03-30 VITALS — BP 124/76 | HR 75 | Temp 97.5°F | Ht 64.5 in | Wt 198.0 lb

## 2022-03-30 DIAGNOSIS — D6869 Other thrombophilia: Secondary | ICD-10-CM | POA: Insufficient documentation

## 2022-03-30 DIAGNOSIS — I25118 Atherosclerotic heart disease of native coronary artery with other forms of angina pectoris: Secondary | ICD-10-CM

## 2022-03-30 DIAGNOSIS — Z0000 Encounter for general adult medical examination without abnormal findings: Secondary | ICD-10-CM

## 2022-03-30 DIAGNOSIS — G4733 Obstructive sleep apnea (adult) (pediatric): Secondary | ICD-10-CM | POA: Diagnosis not present

## 2022-03-30 DIAGNOSIS — G894 Chronic pain syndrome: Secondary | ICD-10-CM | POA: Diagnosis not present

## 2022-03-30 DIAGNOSIS — Z1211 Encounter for screening for malignant neoplasm of colon: Secondary | ICD-10-CM | POA: Diagnosis not present

## 2022-03-30 DIAGNOSIS — F1120 Opioid dependence, uncomplicated: Secondary | ICD-10-CM | POA: Diagnosis not present

## 2022-03-30 DIAGNOSIS — E1159 Type 2 diabetes mellitus with other circulatory complications: Secondary | ICD-10-CM

## 2022-03-30 LAB — HEPATIC FUNCTION PANEL
ALT: 15 U/L (ref 0–53)
AST: 14 U/L (ref 0–37)
Albumin: 4.4 g/dL (ref 3.5–5.2)
Alkaline Phosphatase: 92 U/L (ref 39–117)
Bilirubin, Direct: 0.1 mg/dL (ref 0.0–0.3)
Total Bilirubin: 0.6 mg/dL (ref 0.2–1.2)
Total Protein: 7 g/dL (ref 6.0–8.3)

## 2022-03-30 LAB — HM DIABETES FOOT EXAM

## 2022-03-30 LAB — MICROALBUMIN / CREATININE URINE RATIO
Creatinine,U: 57.1 mg/dL
Microalb Creat Ratio: 21.2 mg/g (ref 0.0–30.0)
Microalb, Ur: 12.1 mg/dL — ABNORMAL HIGH (ref 0.0–1.9)

## 2022-03-30 LAB — RENAL FUNCTION PANEL
Albumin: 4.4 g/dL (ref 3.5–5.2)
BUN: 15 mg/dL (ref 6–23)
CO2: 26 mEq/L (ref 19–32)
Calcium: 9.4 mg/dL (ref 8.4–10.5)
Chloride: 103 mEq/L (ref 96–112)
Creatinine, Ser: 0.87 mg/dL (ref 0.40–1.50)
GFR: 86.84 mL/min (ref >60.00)
Glucose, Bld: 174 mg/dL — ABNORMAL HIGH (ref 70–99)
Phosphorus: 3.5 mg/dL (ref 2.3–4.6)
Potassium: 4 mEq/L (ref 3.5–5.1)
Sodium: 136 mEq/L (ref 135–145)

## 2022-03-30 LAB — CBC
HCT: 47.5 % (ref 39.0–52.0)
Hemoglobin: 15.8 g/dL (ref 13.0–17.0)
MCHC: 33.2 g/dL (ref 30.0–36.0)
MCV: 88.3 fl (ref 78.0–100.0)
Platelets: 210 10*3/uL (ref 150.0–400.0)
RBC: 5.38 Mil/uL (ref 4.22–5.81)
RDW: 14.3 % (ref 11.5–15.5)
WBC: 7.6 10*3/uL (ref 4.0–10.5)

## 2022-03-30 LAB — HEMOGLOBIN A1C: Hgb A1c MFr Bld: 7.7 % — ABNORMAL HIGH (ref 4.6–6.5)

## 2022-03-30 LAB — LIPID PANEL
Cholesterol: 115 mg/dL (ref 0–200)
HDL: 43.8 mg/dL (ref >39.00)
LDL Cholesterol: 32 mg/dL (ref 0–99)
NonHDL: 71.69
Total CHOL/HDL Ratio: 3
Triglycerides: 199 mg/dL — ABNORMAL HIGH (ref 0.0–149.0)
VLDL: 39.8 mg/dL (ref 0.0–40.0)

## 2022-03-30 MED ORDER — CANAGLIFLOZIN 300 MG PO TABS: ORAL_TABLET | ORAL | 3 refills | Status: DC

## 2022-03-30 NOTE — Assessment & Plan Note (Signed)
Seems to have acceptable control ?On metformin 1000 bid, invokana '300mg'$  daily and trulicity 1.12 weekly ?If A1c over 1.6--KOECX increase trulicity to 1.5 ?

## 2022-03-30 NOTE — Progress Notes (Signed)
? ?Subjective:  ? ? Patient ID: [name] [name], male    DOB: [birth year], 71 y.o.   MRN: [id number] ? ?HPI ?Here or Medicare wellness visit and follow up of chronic health conditions ?Reviewed advanced directives ?Reviewed other doctors---[facility], Dr Gollan--cardiology, Dr Gustavus Bryant, Dr Waymond Cera, Dr Jenny Reichmann Beshel--chiropractic, Dr Danelle Earthly ([facility]) ?No hospitalizations or surgery in the past year ?No alcohol or tobacco ?Vision is slightly worse--got new prescription (trouble getting it filled) ?No falls ?No depression or anhedonia ?Independent with instrumental ADLs ?Mild memory issues---mostly just misplaces things ? ?Doing okay ?Has noticed bleeding easily and lots of bruising ?Continues on ASA and plavix ? 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 ?Saw Dr Armen Pickup sent to Dr Sharlet Salina ?Got round of epidural injections ?Seems to be helping ?Still uses the hydrocodone once or twice a day ? ?Checking sugars once a month ?Usually 130-150---done random but mostly fasting ?Occasional mild foot shooting pain---no burning ?Some numbness in left fingers (radial distribution) ? ?Current Outpatient Medications on File Prior to Visit  ?Medication Sig Dispense Refill  ? albuterol (VENTOLIN HFA) 108 (90 Base) MCG/ACT inhaler Inhale 2 puffs into the lungs every 6 (six) hours as needed for wheezing or shortness of breath. 90 g 0  ? Ascorbic Acid (VITAMIN C) 1000 MG tablet Take 1,000 mg by mouth daily.    ? aspirin EC 81 MG tablet Take 1 tablet (81 mg total) by mouth daily. 90 tablet 3  ? clopidogrel (PLAVIX) 75 MG tablet Take 1 tablet (75 mg total) by mouth daily with breakfast. 90 tablet 3  ? Evolocumab with Infusor ([place]) 420 MG/3.5ML SOCT INJECT ONE APPLICATOR INTO THE SKIN EVERY 30 DAYS 3.5 mL 11  ? furosemide  (LASIX) 40 MG tablet Take 1 tablet (40 mg total) by mouth daily as needed. 90 tablet 0  ? glucose blood (FREESTYLE LITE) test strip Use as instructed, patient tests once daily. Dx: 250.00 300 each 3  ? HYDROcodone-acetaminophen (NORCO/VICODIN) 5-325 MG tablet TAKE 1 TABLET BY MOUTH TWICE (2) DAILY 60 tablet 0  ? INVOKANA 300 MG TABS tablet TAKE 1 TABLET BY MOUTH EVERY MORNING BEFORE BREAKFAST 90 tablet 3  ? lisinopril (ZESTRIL) 20 MG tablet Take 1 tablet (20 mg total) by mouth daily. 90 tablet 3  ? LORazepam (ATIVAN) 0.5 MG tablet Take 0.5-1 tablets (0.25-0.5 mg total) by mouth 2 (two) times daily as needed for anxiety. 30 tablet 0  ? metFORMIN (GLUCOPHAGE) 1000 MG tablet TAKE 1 TABLET BY MOUTH TWICE (2) DAILY WITH A MEAL 180 tablet 3  ? nitroGLYCERIN (NITROSTAT) 0.4 MG SL tablet Place 1 tablet (0.4 mg total) under the tongue every 5 (five) minutes as needed for chest pain. 25 tablet 3  ? potassium chloride SA (K-DUR,KLOR-CON) 20 MEQ tablet Take 1 tablet (20 mEq total) by mouth 2 (two) times daily as needed (Take with lasix). 90 tablet 3  ? tamsulosin (FLOMAX) 0.4 MG CAPS capsule TAKE 1 CAPSULE BY MOUTH ONCE DAILY 90 capsule 3  ? triamcinolone cream (KENALOG) 0.1 % Apply 1 application topically 2 (two) times daily as needed. (Patient taking differently: Apply 1 application. topically 2 (two) times daily as needed (irritation).) 30 g 0  ? TRULICITY 5.45 GY/5.6LS SOPN INJECT 0.75 MG UNDER THE SKIN ONCE A WEEK 2 mL 5  ? Zinc 30 MG TABS Take by mouth daily.    ? ?  No current facility-administered medications on file prior to visit.  ? ? ?Allergies  ?Allergen Reactions  ? Doxazosin Mesylate Other (See Comments)  ?  REACTION: didn't work and may have caused SOB  ? Tetracyclines & Related   ? Carvedilol Other (See Comments)  ?  Felt bad  ? Crestor [Rosuvastatin] Other (See Comments)  ?  Muscle aches.  ? Doxycycline Other (See Comments) and Rash  ?  REACTION: unspecified  ? Glimepiride Other (See Comments)  ?  REACTION: red  eyes  ? Glipizide Other (See Comments)  ?  REACTION: myalgia??  ? Losartan Potassium-Hctz Other (See Comments)  ?  REACTION: chest' \\T'$ \ leg pain  ? Metoprolol Tartrate Other (See Comments)  ?  Mood swings, memory changes  ? Pravastatin Other (See Comments)  ?  Muscle aches ?  ? Rosiglitazone Maleate Other (See Comments)  ?  REACTION: myalgia  ? Simvastatin Other (See Comments)  ?  REACTION: myalgias  ? Tetracycline Rash  ? ? ?Past Medical History:  ?Diagnosis Date  ? Angina   ? Arthritis   ? BPH (benign prostatic hypertrophy)   ? Carpal tunnel syndrome   ? Coronary artery disease   ? a. 11/2011 s/p CABG x 3  (LIMA-LAD, SeqSVG-D1-D3); b. 09/2019 CATH-RCA PCI: LM 50ost, LAD 100ost/p, Sev diff diag dzs, LCX nl, OM1 65m OM3 40, RCA 70ost (iFR 0.88--> 3.0x26 Resolute Onyx DES), 577mLIMA->LAD ok. VG->D1->D3 ok. EF 50-55%.; c) 10/01/2019: staged PCI Ost LM (Resolute Onyx 3.5 x 12 - 4.2 mm) & Ost OM1 (Resolute Onyx 2.25 x 26 -- 2.5 mm)  ? GERD (gastroesophageal reflux disease)   ? Hx of colonic polyps   ? Hyperlipidemia   ? Hypertension   ? Kidney stones   ? Morbid obesity ([facility]  ? OSA (obstructive sleep apnea)   ? uses cpap  ? Type II diabetes mellitus ([facility]  ? ? ?Past Surgical History:  ?Procedure Laterality Date  ? CARDIAC CATHETERIZATION    ? CORONARY ARTERY BYPASS GRAFT  11/12/2011  ? Procedure: CORONARY ARTERY BYPASS GRAFTING (CABG);  Surgeon: EdGrace IsaacMD;  [facility];  Laterality: N/A;  Coronary Artery Bypass Graft times three on pump utilizing left internal mammary artery and right saphenous vein harvested endoscopically   ? CORONARY STENT INTERVENTION N/A 09/27/2019  ? Procedure: CORONARY STENT INTERVENTION;  Surgeon: ArWellington HampshireMD;  [facility];  Service: Cardiovascular;  Laterality: N/A;  ? CORONARY STENT INTERVENTION N/A 10/01/2019  ? Procedure: CORONARY STENT INTERVENTION;  Surgeon: EnNelva BushMD;  [facility];   Service: Cardiovascular;  Laterality: N/A;  ? HERNIA REPAIR    ? INTRAVASCULAR PRESSURE WIRE/FFR STUDY N/A 09/27/2019  ? Procedure: INTRAVASCULAR PRESSURE WIRE/FFR STUDY;  Surgeon: ArWellington HampshireMD;  [facility];  Service: Cardiovascular;  Laterality: N/A;  ? INTRAVASCULAR ULTRASOUND/IVUS N/A 10/01/2019  ? Procedure: Intravascular Ultrasound/IVUS;  Surgeon: EnNelva BushMD;  [facility];  Service: Cardiovascular;  Laterality: N/A;  ? KNEE ARTHROPLASTY Right 11/23/2017  ? Procedure: COMPUTER ASSISTED TOTAL KNEE ARTHROPLASTY;  Surgeon: HoDereck LeepMD;  [facility];  Service: Orthopedics;  Laterality: Right;  ? KNEE SURGERY    ? left  ? LEFT HEART CATH AND CORONARY ANGIOGRAPHY Left 09/27/2019  ? Procedure: LEFT HEART CATH AND CORONARY ANGIOGRAPHY;  Surgeon: GoMinna MerrittsMD;  [facility];  Service: Cardiovascular;  Laterality:  Left;  ? LEFT HEART CATH AND CORONARY ANGIOGRAPHY N/A 10/01/2019  ? Procedure: LEFT HEART CATH AND CORONARY ANGIOGRAPHY;  Surgeon: Nelva Bush, MD;  Location: [place] [facility];  Service: Cardiovascular;  Laterality: N/A;  ? MENISCUS REPAIR  03/08  ? right knee  ? ? ?Family History  ?Problem Relation Age of Onset  ? Coronary artery disease Father   ? Prostate cancer Father   ? Diabetes Neg Hx   ? Hypertension Neg Hx   ? ? ?Social History  ? ?Socioeconomic History  ? Marital status: Married  ?  Spouse name: Not on file  ? Number of children: 3  ? Years of education: Not on file  ? Highest education level: Not on file  ?Occupational History  ? Occupation: Surveyor, quantity  ?  Comment: Retired  ? Occupation: Multimedia programmer &/or corn farming  ?Tobacco Use  ? Smoking status: Never  ?  Passive exposure: Never  ? Smokeless tobacco: Never  ?Vaping Use  ? Vaping Use: Never used  ?Substance and Sexual Activity  ? Alcohol use: No  ? Drug use: No  ? Sexual activity: Yes  ?Other Topics Concern  ? Not on file  ?Social History  Narrative  ? No living will  ? Requests wife as health care POA--no clear alternate  ? Would accept resuscitation  ? Would accept tube feedings--not sure about duration  ? ?Social Determinants of Health  ? ?[place]

## 2022-03-30 NOTE — Assessment & Plan Note (Signed)
I have personally reviewed the Medicare Annual Wellness questionnaire and have noted ?1. The patient's medical and social history ?2. Their use of alcohol, tobacco or illicit drugs ?3. Their current medications and supplements ?4. The patient's functional ability including ADL's, fall risks, home safety risks and hearing or visual ?            impairment. ?5. Diet and physical activities ?6. Evidence for depression or mood disorders ? ?The patients weight, height, BMI and visual acuity have been recorded in the chart ?I have made referrals, counseling and provided education to the patient based review of the above and I have provided the pt with a written personalized care plan for preventive services. ? ?I have provided you with a copy of your personalized plan for preventive services. Please take the time to review along with your updated medication list. ? ?FIT again ?No more PSA testing due to age ?Stays active on the farm ?Still prefers no COVID or flu vaccines ?

## 2022-03-30 NOTE — Assessment & Plan Note (Signed)
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

## 2022-03-30 NOTE — Assessment & Plan Note (Signed)
Stable brief atypical chest pain ?On repatha, ASA, plavix, lisinopril 20 ?

## 2022-03-30 NOTE — Assessment & Plan Note (Signed)
PDMP reviewed No concerns 

## 2022-03-30 NOTE — Assessment & Plan Note (Signed)
Mostly back but some other joints ?Uses the hydrocodone once or twice a day ?

## 2022-03-30 NOTE — Assessment & Plan Note (Signed)
Uses CPAP every night with good result ?

## 2022-03-30 NOTE — Progress Notes (Signed)
Hearing Screening - Comments:: Passed whisper test Vision Screening - Comments:: March 2023  

## 2022-04-06 DIAGNOSIS — M5126 Other intervertebral disc displacement, lumbar region: Secondary | ICD-10-CM | POA: Diagnosis not present

## 2022-04-06 DIAGNOSIS — M5416 Radiculopathy, lumbar region: Secondary | ICD-10-CM | POA: Diagnosis not present

## 2022-04-21 ENCOUNTER — Other Ambulatory Visit: Payer: Self-pay | Admitting: Internal Medicine

## 2022-04-22 NOTE — Telephone Encounter (Signed)
Name of Medication: Hydrocodone Name of Pharmacy: [facility] or Written Date and Quantity: 03-04-22 #60 Last Office Visit and Type: 03-30-22 Next Office Visit and Type: 09-29-22 Last Controlled Substance Agreement Date: 09-05-20 Last UDS: 09-05-20

## 2022-05-04 ENCOUNTER — Other Ambulatory Visit (INDEPENDENT_AMBULATORY_CARE_PROVIDER_SITE_OTHER): Payer: Medicare HMO

## 2022-05-04 DIAGNOSIS — Z1211 Encounter for screening for malignant neoplasm of colon: Secondary | ICD-10-CM

## 2022-05-06 ENCOUNTER — Telehealth: Payer: Self-pay | Admitting: Radiology

## 2022-05-06 LAB — FECAL OCCULT BLOOD, IMMUNOCHEMICAL: Fecal Occult Bld: POSITIVE — AB

## 2022-05-06 NOTE — Telephone Encounter (Signed)
[facility] called a POSITIVE ifob, results sent to Dr Silvio Pate and given to Dr Glori Bickers on site

## 2022-05-07 NOTE — Telephone Encounter (Signed)
Left message on VM to see where he wants to go

## 2022-05-12 NOTE — Telephone Encounter (Signed)
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 Pt wants to got to [facility] for the colonoscopy but does not want it scheduled until end of July in the afternoon if possible. Sending note to Dr Silvio Pate to place the referral. Thank you.

## 2022-05-13 ENCOUNTER — Other Ambulatory Visit: Payer: Self-pay | Admitting: Family

## 2022-05-13 DIAGNOSIS — R195 Other fecal abnormalities: Secondary | ICD-10-CM

## 2022-05-14 NOTE — Telephone Encounter (Signed)
[name] Arnold, FNP to Me  Venia Carbon, MD      05/13/22  4:24 PM  Appointment requested for July  Per DPR left v/m that appt requested for July.

## 2022-06-03 ENCOUNTER — Other Ambulatory Visit: Payer: Self-pay | Admitting: Internal Medicine

## 2022-06-03 NOTE — Telephone Encounter (Signed)
Name of Medication: Hydrocodone Name of Pharmacy: [facility] or [facility] Date and Quantity: 04-22-22 #60 Last Office Visit and Type: 03-30-22 Next Office Visit and Type: 09-29-22 Last Controlled Substance Agreement Date: 09-05-20 Last UDS: 09-05-20

## 2022-06-18 DIAGNOSIS — M25512 Pain in left shoulder: Secondary | ICD-10-CM | POA: Diagnosis not present

## 2022-07-20 ENCOUNTER — Other Ambulatory Visit: Payer: Self-pay | Admitting: Internal Medicine

## 2022-07-20 NOTE — Telephone Encounter (Signed)
Name of Medication: Hydrocodone Name of Pharmacy: [facility] or [facility] Date and Quantity: 04-22-22 #60 Last Office Visit and Type: 03-30-22 Next Office Visit and Type: 09-29-22 Last Controlled Substance Agreement Date: 09-05-20 Last UDS: 09-05-20

## 2022-07-30 DIAGNOSIS — M25512 Pain in left shoulder: Secondary | ICD-10-CM | POA: Diagnosis not present

## 2022-08-02 ENCOUNTER — Other Ambulatory Visit: Payer: Self-pay

## 2022-08-02 NOTE — Patient Outreach (Signed)
[facility]) Care Management  08/02/2022  [name] [name] [birth year] [id number]   Telephone Screen    Outreach call to patient to introduce [facility] services and assess care needs as part of benefit of PCP office and insurance plan. No answer after multiple rings.       Plan: RN CM will make outreach attempt to patient within 4 business days.   Enzo Montgomery, RN,BSN,CCM [facility] Telephonic Care Management Coordinator Direct Phone: [phone] Toll Free: [phone] Fax: [phone]

## 2022-08-03 ENCOUNTER — Other Ambulatory Visit: Payer: Self-pay

## 2022-08-03 NOTE — Patient Outreach (Signed)
[facility]) Care Management  08/03/2022  [name] [name] October 10, 1951 [id number]   Telephone Screen       Outreach call to patient to introduce [facility] services and assess care needs as part of benefit of PCP office and insurance plan. Spoke with patient. He denies any RN CM needs or concerns or need for follow up at this time.He is agreeable to RN CM mailing out [facility] letter/brochure for future reference.   Main healthcare issue/concern today: Patient complains of having shoulder pain. He fell about a month ago. He is scheduled to get MRI on next week. He is taking pain meds with some relief-pain is worse at night. He did get an injection which helped. Patient remains independent with ADLs/IADLs. Denies any issues with transportation-able to drive himself to appts. Patient reports that his meds were more costly earlier in the year but co-pay cost has decreased. He declined pharmacy referral for possible med assistance at this time. States he is able to manage and get them.   Health Maintenance/Care Gaps: Last AWV: 03/30/22    Enzo Montgomery, RN,BSN,CCM [facility] Telephonic Care Management Coordinator Direct Phone: [phone] Toll Free: [phone] Fax: [phone]

## 2022-08-11 DIAGNOSIS — M25512 Pain in left shoulder: Secondary | ICD-10-CM | POA: Diagnosis not present

## 2022-08-18 DIAGNOSIS — M75102 Unspecified rotator cuff tear or rupture of left shoulder, not specified as traumatic: Secondary | ICD-10-CM | POA: Diagnosis not present

## 2022-09-07 ENCOUNTER — Other Ambulatory Visit: Payer: Self-pay | Admitting: Internal Medicine

## 2022-09-07 DIAGNOSIS — E113212 Type 2 diabetes mellitus with mild nonproliferative diabetic retinopathy with macular edema, left eye: Secondary | ICD-10-CM | POA: Diagnosis not present

## 2022-09-07 DIAGNOSIS — E113291 Type 2 diabetes mellitus with mild nonproliferative diabetic retinopathy without macular edema, right eye: Secondary | ICD-10-CM | POA: Diagnosis not present

## 2022-09-07 DIAGNOSIS — H40053 Ocular hypertension, bilateral: Secondary | ICD-10-CM | POA: Diagnosis not present

## 2022-09-07 DIAGNOSIS — H11153 Pinguecula, bilateral: Secondary | ICD-10-CM | POA: Diagnosis not present

## 2022-09-07 DIAGNOSIS — H25813 Combined forms of age-related cataract, bilateral: Secondary | ICD-10-CM | POA: Diagnosis not present

## 2022-09-07 NOTE — Telephone Encounter (Signed)
Name of Medication: Hydrocodone Name of Pharmacy: [facility] or Written Date and Quantity: 07-21-22 #60 Last Office Visit and Type: 03-30-22 Next Office Visit and Type: 09-29-22 Last Controlled Substance Agreement Date: 09-05-20 Last UDS: 09-05-20

## 2022-09-10 DIAGNOSIS — M5126 Other intervertebral disc displacement, lumbar region: Secondary | ICD-10-CM | POA: Diagnosis not present

## 2022-09-10 DIAGNOSIS — M5416 Radiculopathy, lumbar region: Secondary | ICD-10-CM | POA: Diagnosis not present

## 2022-09-29 ENCOUNTER — Ambulatory Visit: Payer: Medicare HMO | Admitting: Internal Medicine

## 2022-10-08 DIAGNOSIS — M5416 Radiculopathy, lumbar region: Secondary | ICD-10-CM | POA: Diagnosis not present

## 2022-10-08 DIAGNOSIS — M5126 Other intervertebral disc displacement, lumbar region: Secondary | ICD-10-CM | POA: Diagnosis not present

## 2022-10-13 ENCOUNTER — Other Ambulatory Visit: Payer: Self-pay | Admitting: Cardiovascular Disease

## 2022-10-13 ENCOUNTER — Other Ambulatory Visit: Payer: Self-pay | Admitting: Internal Medicine

## 2022-10-18 ENCOUNTER — Ambulatory Visit (INDEPENDENT_AMBULATORY_CARE_PROVIDER_SITE_OTHER): Payer: Medicare HMO | Admitting: Internal Medicine

## 2022-10-18 ENCOUNTER — Encounter: Payer: Self-pay | Admitting: Internal Medicine

## 2022-10-18 VITALS — BP 136/68 | HR 74 | Temp 97.8°F | Ht 64.5 in | Wt 197.0 lb

## 2022-10-18 DIAGNOSIS — F1120 Opioid dependence, uncomplicated: Secondary | ICD-10-CM | POA: Diagnosis not present

## 2022-10-18 DIAGNOSIS — I25118 Atherosclerotic heart disease of native coronary artery with other forms of angina pectoris: Secondary | ICD-10-CM | POA: Diagnosis not present

## 2022-10-18 DIAGNOSIS — G894 Chronic pain syndrome: Secondary | ICD-10-CM | POA: Diagnosis not present

## 2022-10-18 DIAGNOSIS — E1159 Type 2 diabetes mellitus with other circulatory complications: Secondary | ICD-10-CM | POA: Diagnosis not present

## 2022-10-18 LAB — POCT GLYCOSYLATED HEMOGLOBIN (HGB A1C): Hemoglobin A1C: 8.5 % — AB (ref 4.0–5.6)

## 2022-10-18 MED ORDER — HYDROCODONE-ACETAMINOPHEN 5-325 MG PO TABS: ORAL_TABLET | ORAL | 0 refills | Status: DC

## 2022-10-18 MED ORDER — TRULICITY 1.5 MG/0.5ML ~~LOC~~ SOAJ: 1.5000 mg | SUBCUTANEOUS | 11 refills | Status: DC

## 2022-10-18 MED ORDER — POTASSIUM CHLORIDE CRYS ER 20 MEQ PO TBCR
20.0000 meq | EXTENDED_RELEASE_TABLET | Freq: Two times a day (BID) | ORAL | 3 refills | Status: DC | PRN
Start: 2022-10-18 — End: 2023-04-18

## 2022-10-18 MED ORDER — NITROGLYCERIN 0.4 MG SL SUBL: 0.4000 mg | SUBLINGUAL_TABLET | SUBLINGUAL | 3 refills | Status: AC | PRN

## 2022-10-18 NOTE — Assessment & Plan Note (Signed)
PDMP reviewed --no concerns

## 2022-10-18 NOTE — Assessment & Plan Note (Signed)
Lab Results  Component Value Date   HGBA1C 8.5 (A) 10/18/2022   Has worsened ----seems likely due to dietary indiscretion He will try to eat better Will increase trulicity to 1.5 mg weekly Continue invokana 300, metformin 1000 bid

## 2022-10-18 NOTE — Progress Notes (Signed)
Subjective:    Patient ID: [name] [name], male    DOB: 1951-11-21, 71 y.o.   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  Back is worse---messed it up with fall Got a round of injections by Dr Juanita Laster it didn't help Will be trying another round of this Now on gabapentin '100mg'$ ---at night Still tries to limit the hydrcodone--mostly once a day but 2 some days  No chest pain ---does not occasional "glitch" Hasn't used the nitro Has had more edema--using the furosemide more (needs more potassium) May also get more angina with edema up--figures it is fluid related  Current Outpatient Medications on File Prior to Visit  Medication Sig Dispense Refill   albuterol (VENTOLIN HFA) 108 (90 Base) MCG/ACT inhaler Inhale 2 puffs into the lungs every 6 (six) hours as needed for wheezing or shortness of breath. 90 g 0   Ascorbic Acid (VITAMIN C) 1000 MG tablet Take 1,000 mg by mouth daily.     aspirin EC 81 MG tablet Take 1 tablet (81 mg total) by mouth daily. 90 tablet 3   canagliflozin (INVOKANA) 300 MG TABS tablet TAKE 1 TABLET BY MOUTH EVERY MORNING BEFORE BREAKFAST 90 tablet 3   clopidogrel (PLAVIX) 75 MG tablet Take 1 tablet (75 mg total) by mouth daily with breakfast. Please call [phone] for an appointment for further refills. 90 tablet 0   Evolocumab with Infusor (REPATHA PUSHTRONEX SYSTEM) 420 MG/3.5ML SOCT INJECT ONE APPLICATOR INTO THE SKIN EVERY 30 DAYS 3.5 mL 11   furosemide (LASIX) 40 MG tablet Take 1 tablet (40 mg total) by mouth daily as needed. 90 tablet 0   gabapentin (NEURONTIN) 100 MG capsule Take 100 mg by  mouth at bedtime.     glucose blood (FREESTYLE LITE) test strip Use as instructed, patient tests once daily. Dx: 250.00 300 each 3   HYDROcodone-acetaminophen (NORCO/VICODIN) 5-325 MG tablet TAKE 1 TABLET BY MOUTH TWICE (2) DAILY 60 tablet 0   lisinopril (ZESTRIL) 20 MG tablet Take 1 tablet (20 mg total) by mouth daily. 90 tablet 3   LORazepam (ATIVAN) 0.5 MG tablet Take 0.5-1 tablets (0.25-0.5 mg total) by mouth 2 (two) times daily as needed for anxiety. 30 tablet 0   metFORMIN (GLUCOPHAGE) 1000 MG tablet TAKE 1 TABLET BY MOUTH TWICE (2) DAILY WITH A MEAL 180 tablet 3   nitroGLYCERIN (NITROSTAT) 0.4 MG SL tablet Place 1 tablet (0.4 mg total) under the tongue every 5 (five) minutes as needed for chest pain. 25 tablet 3   potassium chloride SA (K-DUR,KLOR-CON) 20 MEQ tablet Take 1 tablet (20 mEq total) by mouth 2 (two) times daily as needed (Take with lasix). 90 tablet 3   tamsulosin (FLOMAX) 0.4 MG CAPS capsule TAKE 1 CAPSULE BY MOUTH ONCE DAILY 90 capsule 3   triamcinolone cream (KENALOG) 0.1 % Apply 1 application topically 2 (two) times daily as needed. (Patient taking differently: Apply 1 application  topically 2 (two) times daily as needed (irritation).) 30 g 0   TRULICITY 5.46 FK/8.1EX SOPN INJECT 0.75 MG UNDER THE SKIN ONCE A WEEK 2 mL 5   Zinc 30  MG TABS Take by mouth daily.     No current facility-administered medications on file prior to visit.    Allergies  Allergen Reactions   Doxazosin Mesylate Other (See Comments)    REACTION: didn't work and may have caused SOB   Tetracyclines & Related    Carvedilol Other (See Comments)    Felt bad   Crestor [Rosuvastatin] Other (See Comments)    Muscle aches.   Doxycycline Other (See Comments) and Rash    REACTION: unspecified   Glimepiride Other (See Comments)    REACTION: red eyes   Glipizide Other (See Comments)    REACTION: myalgia??   Losartan Potassium-Hctz Other (See Comments)    REACTION: chest' \\T'$ \ leg pain   Metoprolol  Tartrate Other (See Comments)    Mood swings, memory changes   Pravastatin Other (See Comments)    Muscle aches    Rosiglitazone Maleate Other (See Comments)    REACTION: myalgia   Simvastatin Other (See Comments)    REACTION: myalgias   Tetracycline Rash    Past Medical History:  Diagnosis Date   Angina    Arthritis    BPH (benign prostatic hypertrophy)    Carpal tunnel syndrome    Coronary artery disease    a. 11/2011 s/p CABG x 3  (LIMA-LAD, SeqSVG-D1-D3); b. 09/2019 CATH-RCA PCI: LM 50ost, LAD 100ost/p, Sev diff diag dzs, LCX nl, OM1 33m OM3 40, RCA 70ost (iFR 0.88--> 3.0x26 Resolute Onyx DES), 588mLIMA->LAD ok. VG->D1->D3 ok. EF 50-55%.; c) 10/01/2019: staged PCI Ost LM (Resolute Onyx 3.5 x 12 - 4.2 mm) & Ost OM1 (Resolute Onyx 2.25 x 26 -- 2.5 mm)   GERD (gastroesophageal reflux disease)    Hx of colonic polyps    Hyperlipidemia    Hypertension    Kidney stones    Morbid obesity (HCC)    OSA (obstructive sleep apnea)    uses cpap   Type II diabetes mellitus ([place]    Past Surgical History:  Procedure Laterality Date   CARDIAC CATHETERIZATION     CORONARY ARTERY BYPASS GRAFT  11/12/2011   Procedure: CORONARY ARTERY BYPASS GRAFTING (CABG);  Surgeon: EdGrace IsaacMD;  [facility];  Laterality: N/A;  Coronary Artery Bypass Graft times three on pump utilizing left internal mammary artery and right saphenous vein harvested endoscopically    CORONARY STENT INTERVENTION N/A 09/27/2019   Procedure: CORONARY STENT INTERVENTION;  Surgeon: ArWellington HampshireMD;  [facility];  Service: Cardiovascular;  Laterality: N/A;   CORONARY STENT INTERVENTION N/A 10/01/2019   Procedure: CORONARY STENT INTERVENTION;  Surgeon: EnNelva BushMD;  [place] [facility];  Service: Cardiovascular;  Laterality: N/A;   HERNIA REPAIR     INTRAVASCULAR PRESSURE WIRE/FFR STUDY N/A 09/27/2019   Procedure: INTRAVASCULAR PRESSURE  WIRE/FFR STUDY;  Surgeon: ArWellington HampshireMD;  [place];  Service: Cardiovascular;  Laterality: N/A;   INTRAVASCULAR ULTRASOUND/IVUS N/A 10/01/2019   Procedure: Intravascular Ultrasound/IVUS;  Surgeon: EnNelva BushMD;  [facility];  Service: Cardiovascular;  Laterality: N/A;   KNEE ARTHROPLASTY Right 11/23/2017   Procedure: COMPUTER ASSISTED TOTAL KNEE ARTHROPLASTY;  Surgeon: HoDereck LeepMD;  [facility];  Service: Orthopedics;  Laterality: Right;   KNEE SURGERY     left   LEFT HEART CATH AND CORONARY ANGIOGRAPHY Left 09/27/2019   Procedure: LEFT HEART CATH AND CORONARY ANGIOGRAPHY;  Surgeon: GoMinna MerrittsMD;  Location: [place] [facility];  Service: Cardiovascular;  Laterality: Left;   LEFT HEART CATH AND CORONARY ANGIOGRAPHY N/A 10/01/2019   Procedure: LEFT HEART CATH AND CORONARY ANGIOGRAPHY;  Surgeon: Nelva Bush, MD;  Location: [place] [facility];  Service: Cardiovascular;  Laterality: N/A;   MENISCUS REPAIR  03/08   right knee    Family History  Problem Relation Age of Onset   Coronary artery disease Father    Prostate cancer Father    Diabetes Neg Hx    Hypertension Neg Hx     Social History   Socioeconomic History   Marital status: Married    Spouse name: Not on file   Number of children: 3   Years of education: Not on file   Highest education level: Not on file  Occupational History   Occupation: Surveyor, 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  Tobacco Use   Smoking status: Never    Passive exposure: Never   Smokeless tobacco: Never  Vaping Use   Vaping Use: Never used  Substance and Sexual Activity   Alcohol use: No   Drug use: No   Sexual activity: Yes  Other Topics Concern   Not on file  Social History Narrative   No living will   Requests wife as health care POA--no clear alternate   Would accept resuscitation   Would accept tube feedings--not sure about  duration   Social Determinants of Health   Financial Resource Strain: Not on file  Food Insecurity: No Food Insecurity (08/03/2022)   Hunger Vital Sign    Worried About Running Out of Food in the Last Year: Never true    Ran Out of Food in the Last Year: Never true  Transportation Needs: No Transportation Needs (08/03/2022)   PRAPARE - Hydrologist (Medical): No    Lack of Transportation (Non-Medical): No  Physical Activity: Not on file  Stress: Not on file  Social Connections: Not on file  Intimate Partner Violence: Not on file   Review of Systems Sleep worsened with the gabapentin--but might be other stress (adding addition onto house, etc---lots of stress) May need cataracts taken care of also    Objective:   Physical Exam         Assessment & Plan:

## 2022-10-18 NOTE — Assessment & Plan Note (Signed)
Back chronically and now shoulder also Uses the hydrocodone 1-2 per day

## 2022-10-18 NOTE — Assessment & Plan Note (Signed)
Stable angina pattern Hasn't needed the nitro On repatha, lisinopril 20, clopidogrel 75 Has needed the furosemide more

## 2022-10-21 LAB — DRUG MONITORING, PANEL 8 WITH CONFIRMATION, URINE
6 Acetylmorphine: NEGATIVE ng/mL (ref <10)
Alcohol Metabolites: NEGATIVE ng/mL (ref <500)
Amphetamines: NEGATIVE ng/mL (ref <500)
Benzodiazepines: NEGATIVE ng/mL (ref <100)
Buprenorphine, Urine: NEGATIVE ng/mL (ref <5)
Cocaine Metabolite: NEGATIVE ng/mL (ref <150)
Codeine: NEGATIVE ng/mL (ref <50)
Creatinine: 67.8 mg/dL (ref >=20.0)
Hydrocodone: 652 ng/mL — ABNORMAL HIGH (ref <50)
Hydromorphone: 53 ng/mL — ABNORMAL HIGH (ref <50)
MDMA: NEGATIVE ng/mL (ref <500)
Marijuana Metabolite: NEGATIVE ng/mL (ref <20)
Morphine: NEGATIVE ng/mL (ref <50)
Norhydrocodone: 500 ng/mL — ABNORMAL HIGH (ref <50)
Opiates: POSITIVE ng/mL — AB (ref <100)
Oxidant: NEGATIVE ug/mL (ref <200)
Oxycodone: NEGATIVE ng/mL (ref <100)
pH: 5.2 (ref 4.5–9.0)

## 2022-10-21 LAB — DM TEMPLATE

## 2022-11-02 DIAGNOSIS — M545 Low back pain, unspecified: Secondary | ICD-10-CM | POA: Diagnosis not present

## 2022-11-08 DIAGNOSIS — M5416 Radiculopathy, lumbar region: Secondary | ICD-10-CM | POA: Diagnosis not present

## 2022-11-08 DIAGNOSIS — M5126 Other intervertebral disc displacement, lumbar region: Secondary | ICD-10-CM | POA: Diagnosis not present

## 2022-12-01 NOTE — Progress Notes (Signed)
Subjective:    Patient ID: [name] [name], male    DOB: 1951/10/07, 71 y.o.   MRN: [id number]  HPI male never smoker followed for OSA, complicated by DM 2, CAD/ CABG, HBP, GERD, chronic pain/narcotic dependence, hyperlipidemia, BPH NPSG 1998:  AHI 58/hr.  --------------------------------------------------------------------------    12/02/21- -year-old male never smoker followed for OSA, complicated by DM 2, CAD/ CABG, HBP, GERD, chronic pain/narcotic dependence, hyperlipidemia, BPH, CPAP 5-15 auto [employer] Download- compliance 100%, AHI 2.6/ hr     AirSense 10 autoset Body weight today-196 lbs Covid vax-none Flu vax-none He mentions a couple of episodes when he dreamt he was smothering despite his CPAP, but didn't wake up.  This has not happened recently.  We talked about it but on review of his download, control looks quite good.  We agreed to leave things as they are.  No other interval concerns.  12/02/22- 71 year old male never smoker followed for OSA, complicated by DM 2, CAD/ CABG, HBP, GERD, chronic pain/narcotic dependence, hyperlipidemia, BPH, Lumbar Disc Disease,  -Ventolin hfa,  CPAP 5-15 auto [employer] Download- compliance    97%, AHI 3.7/ hr   AirSense 10 autoset Body weight today-197 lbs Covid vax-none Flu vax-none ------Pt is doing okay today Stays active, still farming. May need shoulder surgery. Download reviewed. Sleeps better with CPAP.  ROS-see HPI   + = positive Constitutional:    weight loss, night sweats, fevers, chills, fatigue, lassitude. HEENT:    headaches, difficulty swallowing, tooth/dental problems, sore throat,       +sneezing, itching, ear ache, +nasal congestion, post nasal drip, snoring CV:    chest pain, orthopnea, PND, swelling in lower extremities, anasarca,                                                   dizziness, palpitations Resp:   shortness of breath with exertion or at rest.                productive cough,   non-productive cough,  coughing up of blood.              change in color of mucus.  wheezing.   Skin:    rash or lesions. GI:  No-   heartburn, indigestion, abdominal pain, nausea, vomiting, diarrhea,                 change in bowel habits, loss of appetite GU: dysuria, change in color of urine, no urgency or frequency.   flank pain. MS:  + joint pain, stiffness, decreased range of motion, back pain. Neuro-     nothing unusual Psych:  change in mood or affect.  depression or anxiety.   memory loss.    Objective:  OBJ- Physical Exam General- Alert, Oriented, Affect-appropriate, Distress- none acute, + Full beard, +overweight Skin- rash-none, lesions- none, excoriation- none Lymphadenopathy- none Head- atraumatic            Eyes- Gross vision intact, PERRLA, conjunctivae and secretions clear            Ears- Hearing, canals-normal            Nose- Clear, no-Septal dev, mucus, polyps, erosion, perforation             Throat- Mallampati II-III , mucosa clear , drainage- none, tonsils- atrophic Neck- flexible , trachea midline, no  stridor , thyroid nl, carotid no bruit Chest - symmetrical excursion , unlabored           Heart/CV- RRR , no murmur , no gallop  , no rub, nl s1 s2                           - JVD- none , edema- none, stasis changes- none, varices- none           Lung- clear to P&A, wheeze- none, cough- none , dullness-none, rub- none           Chest wall-  Abd-  Br/ Gen/ Rectal- Not done, not indicated Extrem- cyanosis- none, clubbing, none, atrophy- none, strength- nl Neuro- grossly intact to observation    Assessment & Plan:

## 2022-12-02 ENCOUNTER — Ambulatory Visit: Payer: Medicare HMO | Admitting: Internal Medicine

## 2022-12-02 ENCOUNTER — Encounter: Payer: Self-pay | Admitting: Internal Medicine

## 2022-12-02 VITALS — BP 130/86 | HR 74 | Ht 65.0 in | Wt 197.8 lb

## 2022-12-02 DIAGNOSIS — I25118 Atherosclerotic heart disease of native coronary artery with other forms of angina pectoris: Secondary | ICD-10-CM | POA: Diagnosis not present

## 2022-12-02 DIAGNOSIS — G4733 Obstructive sleep apnea (adult) (pediatric): Secondary | ICD-10-CM

## 2022-12-02 NOTE — Patient Instructions (Signed)
Good luck with your shoulder. I don't see any pulmonary reason that would interfere with surgery.  We can continue CPAP auto 5-15

## 2022-12-16 ENCOUNTER — Other Ambulatory Visit: Payer: Self-pay | Admitting: Internal Medicine

## 2022-12-16 NOTE — Telephone Encounter (Signed)
Name of Medication: Hydrocodone Name of Pharmacy: [facility] or Written Date and Quantity: 07-21-22 #60 Last Office Visit and Type: 10-18-22 Next Office Visit and Type: 01-18-23 Last Controlled Substance Agreement Date: 10-18-22 Last UDS: 10-18-22

## 2022-12-21 ENCOUNTER — Ambulatory Visit (INDEPENDENT_AMBULATORY_CARE_PROVIDER_SITE_OTHER): Payer: Medicare HMO | Admitting: Internal Medicine

## 2022-12-21 ENCOUNTER — Encounter: Payer: Self-pay | Admitting: Internal Medicine

## 2022-12-21 VITALS — BP 130/80 | HR 70 | Temp 97.6°F | Ht 65.0 in | Wt 195.0 lb

## 2022-12-21 DIAGNOSIS — M5416 Radiculopathy, lumbar region: Secondary | ICD-10-CM | POA: Insufficient documentation

## 2022-12-21 MED ORDER — PREDNISONE 20 MG PO TABS: 40.0000 mg | ORAL_TABLET | Freq: Every day | ORAL | 0 refills | Status: DC

## 2022-12-21 NOTE — Progress Notes (Signed)
Subjective:    Patient ID: [name] [name], male    DOB: 06/25/1951, 72 y.o.   MRN: [id number]  HPI Here due to worsened pain issues  Has different type of pain now In hips and down legs into muscles Also with left shoulder locked up Started a couple of weeks ago--and now worse Tried off the gabapentin---then felt better  Now with recurrence ---"burns like fire in hips" Trouble standing and sitting due to hip/leg pain Started cherry juice yesterday---thinking this could be gout. Took some prednisone also Better last night after that  Missed some repatha--and symptoms actually worse then  More regular with the hydrocodone Mostly for back and shoulder  No saddle anaesthesia No loss or bladder or bowel control   Current Outpatient Medications on File Prior to Visit  Medication Sig Dispense Refill   albuterol (VENTOLIN HFA) 108 (90 Base) MCG/ACT inhaler Inhale 2 puffs into the lungs every 6 (six) hours as needed for wheezing or shortness of breath. 90 g 0   Ascorbic Acid (VITAMIN C) 1000 MG tablet Take 1,000 mg by mouth daily.     aspirin EC 81 MG tablet Take 1 tablet (81 mg total) by mouth daily. 90 tablet 3   canagliflozin (INVOKANA) 300 MG TABS tablet TAKE 1 TABLET BY MOUTH EVERY MORNING BEFORE BREAKFAST 90 tablet 3   clopidogrel (PLAVIX) 75 MG tablet Take 1 tablet (75 mg total) by mouth daily with breakfast. Please call [phone] for an appointment for further refills. 90 tablet 0   Dulaglutide (TRULICITY) 1.5 UM/3.5TI SOPN Inject 1.5 mg into the skin once a week. 2 mL 11   Evolocumab with Infusor (REPATHA PUSHTRONEX SYSTEM) 420 MG/3.5ML SOCT INJECT ONE APPLICATOR INTO THE SKIN EVERY 30 DAYS 3.5 mL 11   furosemide (LASIX) 40 MG tablet Take 1 tablet (40 mg total) by mouth daily as needed. 90 tablet 0   glucose blood (FREESTYLE LITE) test strip Use as instructed, patient tests once daily. Dx: 250.00 300 each 3   HYDROcodone-acetaminophen (NORCO/VICODIN) 5-325 MG tablet  TAKE 1 TABLET BY MOUTH 2 TIMES DAILY 60 tablet 0   lisinopril (ZESTRIL) 20 MG tablet Take 1 tablet (20 mg total) by mouth daily. 90 tablet 3   LORazepam (ATIVAN) 0.5 MG tablet Take 0.5-1 tablets (0.25-0.5 mg total) by mouth 2 (two) times daily as needed for anxiety. 30 tablet 0   metFORMIN (GLUCOPHAGE) 1000 MG tablet TAKE 1 TABLET BY MOUTH TWICE (2) DAILY WITH A MEAL 180 tablet 3   nitroGLYCERIN (NITROSTAT) 0.4 MG SL tablet Place 1 tablet (0.4 mg total) under the tongue every 5 (five) minutes as needed for chest pain. 25 tablet 3   potassium chloride SA (KLOR-CON M) 20 MEQ tablet Take 1 tablet (20 mEq total) by mouth 2 (two) times daily as needed (Take with lasix). 90 tablet 3   tamsulosin (FLOMAX) 0.4 MG CAPS capsule TAKE 1 CAPSULE BY MOUTH ONCE DAILY 90 capsule 3   triamcinolone cream (KENALOG) 0.1 % Apply 1 application topically 2 (two) times daily as needed. (Patient taking differently: Apply 1 application  topically 2 (two) times daily as needed (irritation).) 30 g 0   Zinc 30 MG TABS Take by mouth daily.     No current facility-administered medications on file prior to visit.    Allergies  Allergen Reactions   Doxazosin Mesylate Other (See Comments)    REACTION: didn't work and may have caused SOB   Tetracyclines & Related    Carvedilol Other (See Comments)  Felt bad   Crestor [Rosuvastatin] Other (See Comments)    Muscle aches.   Doxycycline Other (See Comments) and Rash    REACTION: unspecified   Glimepiride Other (See Comments)    REACTION: red eyes   Glipizide Other (See Comments)    REACTION: myalgia??   Losartan Potassium-Hctz Other (See Comments)    REACTION: chest' \\T'$ \ leg pain   Metoprolol Tartrate Other (See Comments)    Mood swings, memory changes   Pravastatin Other (See Comments)    Muscle aches    Rosiglitazone Maleate Other (See Comments)    REACTION: myalgia   Simvastatin Other (See Comments)    REACTION: myalgias   Tetracycline Rash    Past Medical  History:  Diagnosis Date   Angina    Arthritis    BPH (benign prostatic hypertrophy)    Carpal tunnel syndrome    Coronary artery disease    a. 11/2011 s/p CABG x 3  (LIMA-LAD, SeqSVG-D1-D3); b. 09/2019 CATH-RCA PCI: LM 50ost, LAD 100ost/p, Sev diff diag dzs, LCX nl, OM1 70m OM3 40, RCA 70ost (iFR 0.88--> 3.0x26 Resolute Onyx DES), 540mLIMA->LAD ok. VG->D1->D3 ok. EF 50-55%.; c) 10/01/2019: staged PCI Ost LM (Resolute Onyx 3.5 x 12 - 4.2 mm) & Ost OM1 (Resolute Onyx 2.25 x 26 -- 2.5 mm)   GERD (gastroesophageal reflux disease)    Hx of colonic polyps    Hyperlipidemia    Hypertension    Kidney stones    Morbid obesity (HCC)    OSA (obstructive sleep apnea)    uses cpap   Type II diabetes mellitus ([place]    Past Surgical History:  Procedure Laterality Date   CARDIAC CATHETERIZATION     CORONARY ARTERY BYPASS GRAFT  11/12/2011   Procedure: CORONARY ARTERY BYPASS GRAFTING (CABG);  Surgeon: EdGrace IsaacMD;  [place]: Open [facility] Surgery;  Laterality: N/A;  Coronary Artery Bypass Graft times three on pump utilizing left internal mammary artery and right saphenous vein harvested endoscopically    CORONARY STENT INTERVENTION N/A 09/27/2019   Procedure: CORONARY STENT INTERVENTION;  Surgeon: ArWellington HampshireMD;  [facility];  Service: Cardiovascular;  Laterality: N/A;   CORONARY STENT INTERVENTION N/A 10/01/2019   Procedure: CORONARY STENT INTERVENTION;  Surgeon: EnNelva BushMD;  [place] [facility];  Service: Cardiovascular;  Laterality: N/A;   HERNIA REPAIR     INTRAVASCULAR PRESSURE WIRE/FFR STUDY N/A 09/27/2019   Procedure: INTRAVASCULAR PRESSURE WIRE/FFR STUDY;  Surgeon: ArWellington HampshireMD;  [facility];  Service: Cardiovascular;  Laterality: N/A;   INTRAVASCULAR ULTRASOUND/IVUS N/A 10/01/2019   Procedure: Intravascular Ultrasound/IVUS;  Surgeon: EnNelva BushMD;  [facility];   Service: Cardiovascular;  Laterality: N/A;   KNEE ARTHROPLASTY Right 11/23/2017   Procedure: COMPUTER ASSISTED TOTAL KNEE ARTHROPLASTY;  Surgeon: HoDereck LeepMD;  [facility];  Service: Orthopedics;  Laterality: Right;   KNEE SURGERY     left   LEFT HEART CATH AND CORONARY ANGIOGRAPHY Left 09/27/2019   Procedure: LEFT HEART CATH AND CORONARY ANGIOGRAPHY;  Surgeon: GoMinna MerrittsMD;  [place];  Service: Cardiovascular;  Laterality: Left;   LEFT HEART CATH AND CORONARY ANGIOGRAPHY N/A 10/01/2019   Procedure: LEFT HEART CATH AND CORONARY ANGIOGRAPHY;  Surgeon: EnNelva BushMD;  [facility];  Service: Cardiovascular;  Laterality: N/A;   MENISCUS REPAIR  03/08   right knee  Family History  Problem Relation Age of Onset   Coronary artery disease Father    Prostate cancer Father    Diabetes Neg Hx    Hypertension Neg Hx     Social History   Socioeconomic History   Marital status: Married    Spouse name: Not on file   Number of children: 3   Years of education: Not on file   Highest education level: Not on file  Occupational History   Occupation: Surveyor, 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  Tobacco Use   Smoking status: Never    Passive exposure: Never   Smokeless tobacco: Never  Vaping Use   Vaping Use: Never used  Substance and Sexual Activity   Alcohol use: No   Drug use: No   Sexual activity: Yes  Other Topics Concern   Not on file  Social History Narrative   No living will   Requests wife as health care POA--no clear alternate   Would accept resuscitation   Would accept tube feedings--not sure about duration   Social Determinants of Health   Financial Resource Strain: Not on file  Food Insecurity: No Food Insecurity (08/03/2022)   Hunger Vital Sign    Worried About Running Out of Food in the Last Year: Never true    [place] in the Last Year: Never true   Transportation Needs: No Transportation Needs (08/03/2022)   PRAPARE - Hydrologist (Medical): No    Lack of Transportation (Non-Medical): No  Physical Activity: Not on file  Stress: Not on file  Social Connections: Not on file  Intimate Partner Violence: Not on file   Review of Systems No fever Not sick      Objective:   Physical Exam Musculoskeletal:     Comments: No spine tenderness Very little internal rotation of hips but not painful  Neurological:     Comments: Antalgic gait but loosened up some No focal weakness            Assessment & Plan:

## 2022-12-21 NOTE — Assessment & Plan Note (Addendum)
Symptoms with the burning and sharp pains seems radicular Discussed that his hip/leg symptoms are not consistent with gout No known stenosis and no recent injury Could have element of hip osteoarthritis as well (stiffness that loosens up)---but less likely to give the extreme, shooting pain Will extend the prednisone for 6 days Going to physiatrist later this week May need hip x-rays

## 2022-12-23 DIAGNOSIS — M5416 Radiculopathy, lumbar region: Secondary | ICD-10-CM | POA: Diagnosis not present

## 2022-12-23 DIAGNOSIS — M5126 Other intervertebral disc displacement, lumbar region: Secondary | ICD-10-CM | POA: Diagnosis not present

## 2022-12-27 ENCOUNTER — Other Ambulatory Visit (HOSPITAL_COMMUNITY): Payer: Self-pay

## 2022-12-28 NOTE — Assessment & Plan Note (Signed)
He reports no new cardiac events or concerns

## 2022-12-28 NOTE — Assessment & Plan Note (Signed)
Benefits from CPAP with good compliance and control Plan- continue auto 5-15 

## 2023-01-06 ENCOUNTER — Encounter: Payer: Self-pay | Admitting: Internal Medicine

## 2023-01-06 MED ORDER — PREDNISONE 20 MG PO TABS: 40.0000 mg | ORAL_TABLET | Freq: Every day | ORAL | 0 refills | Status: DC

## 2023-01-10 DIAGNOSIS — H25813 Combined forms of age-related cataract, bilateral: Secondary | ICD-10-CM | POA: Diagnosis not present

## 2023-01-10 DIAGNOSIS — H40053 Ocular hypertension, bilateral: Secondary | ICD-10-CM | POA: Diagnosis not present

## 2023-01-10 DIAGNOSIS — E113291 Type 2 diabetes mellitus with mild nonproliferative diabetic retinopathy without macular edema, right eye: Secondary | ICD-10-CM | POA: Diagnosis not present

## 2023-01-10 DIAGNOSIS — E113212 Type 2 diabetes mellitus with mild nonproliferative diabetic retinopathy with macular edema, left eye: Secondary | ICD-10-CM | POA: Diagnosis not present

## 2023-01-10 LAB — HM DIABETES EYE EXAM

## 2023-01-17 ENCOUNTER — Encounter: Payer: Self-pay | Admitting: Pharmacist

## 2023-01-17 NOTE — Progress Notes (Signed)
[facility]) [place] Team Statin Quality Measure Assessment  01/17/2023  [name] [name] [birth year] [id number]  Per review of chart and payor information, patient has a diagnosis of diabetes but is not currently filling a statin prescription.  This places patient into the Statin Use In Patients with Diabetes (SUPD) measure for CMS.    Documentation of statin intolerance, rosuvastatin (muscle aches), pravastatin (muscle aches), simvastatin (myalgias). Patient is treated with PCSK9i.    The ASCVD Risk score (Arnett DK, et al., 2019) failed to calculate for the following reasons:   The valid total cholesterol range is 130 to 320 mg/dL 03/30/2022     Component Value Date/Time   CHOL 115 03/30/2022 1244   CHOL 114 05/13/2020 1206   TRIG 199.0 (H) 03/30/2022 1244   HDL 43.80 03/30/2022 1244   HDL 35 (L) 05/13/2020 1206   CHOLHDL 3 03/30/2022 1244   VLDL 39.8 03/30/2022 1244   LDLCALC 32 03/30/2022 1244   LDLCALC 48 05/13/2020 1206   LDLDIRECT 132.0 09/05/2019 1138    Please consider ONE of the following recommendations:  Initiate high intensity statin Atorvastatin 40 mg once daily, #90, 3 refills   Rosuvastatin 20 mg once daily, #90, 3 refills    Initiate moderate intensity          statin with reduced frequency if prior          statin intolerance 1x weekly, #13, 3 refills   2x weekly, #26, 3 refills   3x weekly, #39, 3 refills    Code for past statin intolerance or  other exclusions (required annually)  Provider Requirements: Associate code during an office visit or telehealth encounter  Drug Induced Myopathy G72.0   Myopathy, unspecified G72.9   Myositis, unspecified M60.9   Rhabdomyolysis M62.82   Cirrhosis of liver K74.69   Prediabetes R73.03   PCOS E28.2   Thank you for allowing [facility] pharmacy to be a part of this patient's care.   Loretha Brasil, PharmD [place] Clinical Pharmacist Direct Dial:  [phone]

## 2023-01-18 ENCOUNTER — Encounter: Payer: Self-pay | Admitting: Internal Medicine

## 2023-01-18 ENCOUNTER — Ambulatory Visit (INDEPENDENT_AMBULATORY_CARE_PROVIDER_SITE_OTHER): Payer: Medicare HMO | Admitting: Internal Medicine

## 2023-01-18 VITALS — BP 138/80 | HR 78 | Temp 97.2°F | Ht 65.0 in | Wt 192.0 lb

## 2023-01-18 DIAGNOSIS — G720 Drug-induced myopathy: Secondary | ICD-10-CM

## 2023-01-18 DIAGNOSIS — G894 Chronic pain syndrome: Secondary | ICD-10-CM | POA: Diagnosis not present

## 2023-01-18 DIAGNOSIS — F1120 Opioid dependence, uncomplicated: Secondary | ICD-10-CM

## 2023-01-18 DIAGNOSIS — M353 Polymyalgia rheumatica: Secondary | ICD-10-CM | POA: Diagnosis not present

## 2023-01-18 DIAGNOSIS — E1159 Type 2 diabetes mellitus with other circulatory complications: Secondary | ICD-10-CM | POA: Diagnosis not present

## 2023-01-18 DIAGNOSIS — T466X5A Adverse effect of antihyperlipidemic and antiarteriosclerotic drugs, initial encounter: Secondary | ICD-10-CM

## 2023-01-18 LAB — POCT GLYCOSYLATED HEMOGLOBIN (HGB A1C): Hemoglobin A1C: 9.7 % — AB (ref 4.0–5.6)

## 2023-01-18 LAB — SEDIMENTATION RATE: Sed Rate: 55 mm/hr — ABNORMAL HIGH (ref 0–20)

## 2023-01-18 MED ORDER — ACCU-CHEK FASTCLIX LANCET KIT: 1.0000 | PACK | Freq: Once | 0 refills | Status: AC

## 2023-01-18 MED ORDER — ACCU-CHEK FASTCLIX LANCETS MISC: 4 refills | Status: AC

## 2023-01-18 MED ORDER — METFORMIN HCL 1000 MG PO TABS: ORAL_TABLET | ORAL | 3 refills | Status: DC

## 2023-01-18 MED ORDER — PREDNISONE 5 MG PO TABS: 10.0000 mg | ORAL_TABLET | Freq: Every day | ORAL | 2 refills | Status: DC

## 2023-01-18 MED ORDER — TRULICITY 3 MG/0.5ML ~~LOC~~ SOAJ: 3.0000 mg | SUBCUTANEOUS | 11 refills | Status: DC

## 2023-01-18 MED ORDER — ACCU-CHEK GUIDE W/DEVICE KIT: 1.0000 | PACK | Freq: Once | 0 refills | Status: AC

## 2023-01-18 MED ORDER — ACCU-CHEK GUIDE VI STRP: ORAL_STRIP | 4 refills | Status: AC

## 2023-01-18 NOTE — Assessment & Plan Note (Signed)
Has had severe pain with statins Doing well on repatha

## 2023-01-18 NOTE — Assessment & Plan Note (Signed)
Lab Results  Component Value Date   HGBA1C 9.7 (A) 01/18/2023   Control much worse with the prednisone and trouble with the severe pain Will increase the trulicity to 81m weekly, continue invokana 3090m metformin 1000 bid

## 2023-01-18 NOTE — Assessment & Plan Note (Signed)
PDMP reviewed No concerns 

## 2023-01-18 NOTE — Assessment & Plan Note (Signed)
Severe hip/pelvic and shoulder pain---highly responsive to prednisone Will check sed rate Start prednisone 90m and try to wean quickly to 10 daily

## 2023-01-18 NOTE — Assessment & Plan Note (Signed)
Maintains function with the hydrocodone once or twice a day

## 2023-01-18 NOTE — Patient Instructions (Signed)
Increase the trulicity to 37m weekly. Start the prednisone at 247mdaily (4 of the five mg pills). If you pain goes away quickly, go down to 1549maily after a week. If no problems with that after 2 weeks, try just 34m28mily then.

## 2023-01-18 NOTE — Progress Notes (Signed)
Subjective:    Patient ID: [name] [name], male    DOB: 1951-07-18, 72 y.o.   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, etc Pain keeps him from sleeping--can't find a comfortable spot  Done with epidural injections--per Dr Ahmed Prima (never did much good)  Hasn't been checking sugars Didn't have meter--so we are trying to get this for him again Has slipped some with healthy eating----the pain has made it hard  No chest pain or SOB No GI symptoms with the trulicity  Has not tolerated statins with severe muscle pain Doing okay with repatha  Current Outpatient Medications on File Prior to Visit  Medication Sig Dispense Refill   albuterol (VENTOLIN HFA) 108 (90 Base) MCG/ACT inhaler Inhale 2 puffs into the lungs every 6 (six) hours as needed for wheezing or shortness of breath. 90 g 0   Ascorbic Acid (VITAMIN C) 1000 MG tablet Take 1,000 mg by mouth daily.     aspirin EC 81 MG tablet Take 1 tablet (81 mg total) by mouth daily. 90 tablet 3   canagliflozin (INVOKANA) 300 MG TABS tablet TAKE 1 TABLET BY MOUTH EVERY MORNING BEFORE BREAKFAST 90 tablet 3   clopidogrel (PLAVIX) 75 MG tablet Take 1 tablet (75 mg total) by mouth daily with breakfast. Please call [phone] for an appointment for further refills. 90 tablet 0   Dulaglutide (TRULICITY) 1.5 0000000 SOPN Inject 1.5 mg into the skin once a week. 2 mL 11   Evolocumab with Infusor (REPATHA PUSHTRONEX SYSTEM) 420 MG/3.5ML SOCT INJECT ONE APPLICATOR INTO THE SKIN EVERY 30 DAYS 3.5 mL 11   furosemide (LASIX) 40 MG tablet Take 1 tablet (40 mg total) by mouth daily as needed. 90 tablet 0   HYDROcodone-acetaminophen (NORCO/VICODIN) 5-325 MG tablet TAKE 1 TABLET BY MOUTH 2 TIMES  DAILY 60 tablet 0   lisinopril (ZESTRIL) 20 MG tablet Take 1 tablet (20 mg total) by mouth daily. 90 tablet 3   LORazepam (ATIVAN) 0.5 MG tablet Take 0.5-1 tablets (0.25-0.5 mg total) by mouth 2 (two) times daily as needed for anxiety. 30 tablet 0   metFORMIN (GLUCOPHAGE) 1000 MG tablet TAKE 1 TABLET BY MOUTH TWICE (2) DAILY WITH A MEAL 180 tablet 3   nitroGLYCERIN (NITROSTAT) 0.4 MG SL tablet Place 1 tablet (0.4 mg total) under the tongue every 5 (five) minutes as needed for chest pain. 25 tablet 3   potassium chloride SA (KLOR-CON M) 20 MEQ tablet Take 1 tablet (20 mEq total) by mouth 2 (two) times daily as needed (Take with lasix). 90 tablet 3   tamsulosin (FLOMAX) 0.4 MG CAPS capsule TAKE 1 CAPSULE BY MOUTH ONCE DAILY 90 capsule 3   triamcinolone cream (KENALOG) 0.1 % Apply 1 application topically 2 (two) times daily as needed. (Patient taking differently: Apply 1 application  topically 2 (two) times daily as needed (irritation).) 30 g 0   Zinc 30 MG TABS Take by mouth daily.     No current facility-administered medications on file prior to visit.    Allergies  Allergen Reactions   Doxazosin Mesylate Other (See Comments)    REACTION: didn't work and may have caused SOB   Tetracyclines & Related    Carvedilol Other (See Comments)  Felt bad   Crestor [Rosuvastatin] Other (See Comments)    Muscle aches.   Doxycycline Other (See Comments) and Rash    REACTION: unspecified   Glimepiride Other (See Comments)    REACTION: red eyes   Glipizide Other (See Comments)    REACTION: myalgia??   Losartan Potassium-Hctz Other (See Comments)    REACTION: chest \T\ leg pain   Metoprolol Tartrate Other (See Comments)    Mood swings, memory changes   Pravastatin Other (See Comments)    Muscle aches    Rosiglitazone Maleate Other (See Comments)    REACTION: myalgia   Simvastatin Other (See Comments)    REACTION: myalgias   Tetracycline Rash    Past Medical History:  Diagnosis Date    Angina    Arthritis    BPH (benign prostatic hypertrophy)    Carpal tunnel syndrome    Coronary artery disease    a. 11/2011 s/p CABG x 3  (LIMA-LAD, SeqSVG-D1-D3); b. 09/2019 CATH-RCA PCI: LM 50ost, LAD 100ost/p, Sev diff diag dzs, LCX nl, OM1 55m OM3 40, RCA 70ost (iFR 0.88--> 3.0x26 Resolute Onyx DES), 560mLIMA->LAD ok. VG->D1->D3 ok. EF 50-55%.; c) 10/01/2019: staged PCI Ost LM (Resolute Onyx 3.5 x 12 - 4.2 mm) & Ost OM1 (Resolute Onyx 2.25 x 26 -- 2.5 mm)   GERD (gastroesophageal reflux disease)    Hx of colonic polyps    Hyperlipidemia    Hypertension    Kidney stones    Morbid obesity (HCC)    OSA (obstructive sleep apnea)    uses cpap   [place]    Past Surgical History:  Procedure Laterality Date   CARDIAC CATHETERIZATION     CORONARY ARTERY BYPASS GRAFT  11/12/2011   Procedure: CORONARY ARTERY BYPASS GRAFTING (CABG);  Surgeon: EdGrace IsaacMD;  [facility] Service: Open Heart Surgery;  Laterality: N/A;  Coronary Artery Bypass Graft times three on pump utilizing left internal mammary artery and right saphenous vein harvested endoscopically    CORONARY STENT INTERVENTION N/A 09/27/2019   Procedure: CORONARY STENT INTERVENTION;  Surgeon: ArWellington HampshireMD;  [facility];  Service: Cardiovascular;  Laterality: N/A;   CORONARY STENT INTERVENTION N/A 10/01/2019   Procedure: CORONARY STENT INTERVENTION;  Surgeon: EnNelva BushMD;  [facility];  Service: Cardiovascular;  Laterality: N/A;   HERNIA REPAIR     INTRAVASCULAR PRESSURE WIRE/FFR STUDY N/A 09/27/2019   Procedure: INTRAVASCULAR PRESSURE WIRE/FFR STUDY;  Surgeon: ArWellington HampshireMD;  [facility];  Service: Cardiovascular;  Laterality: N/A;   INTRAVASCULAR ULTRASOUND/IVUS N/A 10/01/2019   Procedure: Intravascular Ultrasound/IVUS;  Surgeon: EnNelva BushMD;  [facility];  Service: Cardiovascular;   Laterality: N/A;   KNEE ARTHROPLASTY Right 11/23/2017   Procedure: COMPUTER ASSISTED TOTAL KNEE ARTHROPLASTY;  Surgeon: HoDereck LeepMD;  [facility];  Service: Orthopedics;  Laterality: Right;   KNEE SURGERY     left   LEFT HEART CATH AND CORONARY ANGIOGRAPHY Left 09/27/2019   Procedure: LEFT HEART CATH AND CORONARY ANGIOGRAPHY;  Surgeon: GoMinna MerrittsMD;  [facility];  Service: Cardiovascular;  Laterality: Left;   LEFT HEART CATH AND CORONARY ANGIOGRAPHY N/A 10/01/2019   Procedure: LEFT HEART CATH AND CORONARY ANGIOGRAPHY;  Surgeon: EnNelva BushMD;  [facility];  Service: Cardiovascular;  Laterality: N/A;   MENISCUS REPAIR  03/08   right knee  Family History  Problem Relation Age of Onset   Coronary artery disease Father    Prostate cancer Father    Diabetes Neg Hx    Hypertension Neg Hx     Social History   Socioeconomic History   Marital status: Married    Spouse name: Not on file   Number of children: 3   Years of education: Not on file   Highest education level: Not on file  Occupational History   Occupation: Surveyor, 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  Tobacco Use   Smoking status: Never    Passive exposure: Never   Smokeless tobacco: Never  Vaping Use   Vaping Use: Never used  Substance and Sexual Activity   Alcohol use: No   Drug use: No   Sexual activity: Yes  Other Topics Concern   Not on file  Social History Narrative   No living will   Requests wife as health care POA--no clear alternate   Would accept resuscitation   Would accept tube feedings--not sure about duration   Social Determinants of Health   Financial Resource Strain: Not on file  Food Insecurity: No Food Insecurity (08/03/2022)   Hunger Vital Sign    Worried About Running Out of Food in the Last Year: Never true    Notus in the Last Year: Never true  Transportation Needs: No  Transportation Needs (08/03/2022)   PRAPARE - Hydrologist (Medical): No    Lack of Transportation (Non-Medical): No  Physical Activity: Not on file  Stress: Not on file  Social Connections: Not on file  Intimate Partner Violence: Not on file   Review of Systems Weight is down 5# in 3 months Bowels remain fine without meds     Objective:   Physical Exam Constitutional:      Appearance: Normal appearance.  Cardiovascular:     Rate and Rhythm: Normal rate and regular rhythm.     Pulses: Normal pulses.     Heart sounds: No murmur heard.    No gallop.  Pulmonary:     Effort: Pulmonary effort is normal.     Breath sounds: Normal breath sounds. No wheezing or rales.  Musculoskeletal:     Cervical back: Neck supple.  Skin:    Comments: No foot lesions  Neurological:     Mental Status: He is alert.     Comments: Normal sensation in feet            Assessment & Plan:

## 2023-02-09 ENCOUNTER — Other Ambulatory Visit: Payer: Self-pay | Admitting: Internal Medicine

## 2023-02-09 ENCOUNTER — Other Ambulatory Visit: Payer: Self-pay | Admitting: Cardiovascular Disease

## 2023-02-09 ENCOUNTER — Telehealth: Payer: Self-pay | Admitting: Cardiovascular Disease

## 2023-02-09 NOTE — Telephone Encounter (Signed)
*  STAT* If patient is at the pharmacy, call can be transferred to refill team.   1. Which medications need to be refilled? (please list name of each medication and dose if known) Evolocumab with Infusor ([place]) 420 MG/3.5ML SOCT   2. Which pharmacy/location (including street and city if local pharmacy) is medication to be sent to?  [place], [place]   3. Do they need a 30 day or 90 day supply? [place]

## 2023-02-09 NOTE — Telephone Encounter (Signed)
Name of Medication: Hydrocodone Name of Pharmacy: [facility] or Written Date and Quantity: 12-16-22 #60 Last Office Visit and Type: 01-18-23 Next Office Visit and Type: 04-19-23 Last Controlled Substance Agreement Date: 10-18-22 Last UDS: 10-18-22   Per pharmacy, there should not be any issues with the rxs going through now.

## 2023-03-09 ENCOUNTER — Other Ambulatory Visit: Payer: Self-pay | Admitting: Cardiovascular Disease

## 2023-03-13 ENCOUNTER — Encounter: Payer: Self-pay | Admitting: Internal Medicine

## 2023-03-17 ENCOUNTER — Other Ambulatory Visit: Payer: Self-pay | Admitting: Internal Medicine

## 2023-03-21 ENCOUNTER — Telehealth: Payer: Self-pay | Admitting: Cardiovascular Disease

## 2023-03-21 MED ORDER — REPATHA PUSHTRONEX SYSTEM 420 MG/3.5ML ~~LOC~~ SOCT: 3.5000 mL | SUBCUTANEOUS | 0 refills | Status: DC

## 2023-03-21 NOTE — Telephone Encounter (Signed)
Pt c/o medication issue:  1. Name of Medication: Evolocumab with Infusor (REPATHA PUSHTRONEX SYSTEM) 420 MG/3.5ML SOCT   2. How are you currently taking this medication (dosage and times per day)? INJECT ONE APPLICATOR INTO THE SKIN EVERY 30 DAYS   3. Are you having a reaction (difficulty breathing--STAT)? no  4. What is your medication issue? [facility] was calling to get a verbal for replacement on damage medication for the Repatha Pushtronex System. Reference# [id number]

## 2023-03-21 NOTE — Telephone Encounter (Signed)
Refill has been sent in to [facility] for replacement pump.

## 2023-04-04 ENCOUNTER — Other Ambulatory Visit: Payer: Self-pay | Admitting: Internal Medicine

## 2023-04-04 NOTE — Telephone Encounter (Signed)
Name of Medication: Hydrocodone Name of Pharmacy: [facility] or Written Date and Quantity: 02-09-23 #60 Last Office Visit and Type: 01-18-23 Next Office Visit and Type: 04-19-23 Last Controlled Substance Agreement Date: 10-18-22 Last UDS: 10-18-22

## 2023-04-12 NOTE — Telephone Encounter (Signed)
Received [employer] Product Replacement Prescription Verification Form. Form completed by Dr. Mariah Milling and faxed to [employer].

## 2023-04-17 NOTE — Progress Notes (Unsigned)
Cardiology Office Note  Date:  04/18/2023   ID:  [name] [name], DOB 1951-03-02, MRN [id number]  PCP:  Karie Schwalbe, MD   Chief Complaint  Patient presents with   12 month follow up     Patient c/o unusual weight loss. Medications reviewed by the patient verbally.     HPI:  72 year-old gentleman with  obesity,  hyperlipidemia,  diabetes,  hypertension,  CAD, s/p CABG. severe LAD disease, bypass surgery November 12 2011. 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  Unintentional weight loss over the past year Weight down 16 in one year since his last clinic visit  On predisone 20 daily for polymyalgia, reports symptoms are improved but A1c going higher despite his best efforts and changes to his medications  Denies chest pain or shortness of breath on exertion  Exercise limited secondary to back pain  Labs reviewed HBA1C 9.7, trending upwards No recent lipid panel  Most recent total cholesterol 115 LDL 32 monthly Repatha  EKG personally reviewed by myself on todays visit nsr rate 69 bpm left bundle branch block, unchanged  Prior records reviewed Cath , Three-vessel coronary artery disease, including 60% ostial/proximal LMCA stenosis (MLA 5.4 mm^2 by IVUS), chronic total occlusion of the proximal LAD (known patent LIMA-LAD and SVG-D1 and D2), 90% OM1 stenosis, and 50% mid/distal RCA stenosis. Widely patent proximal RCA stent. Normal left ventricular filling pressure. Successful IVUS-guided PCI to the ostial through mid LMCA using Resolute Onyx 3.5 12 mm drug-eluting  stent (postdilated to 4.2 mm) with 0% residual stenosis and TIMI-3 flow. Successful PCI to OM1 using Resolute Onyx 2.25 x 26 mm drug-eluting stent with 0% residual stenosis and TIMI-3 flow.  unable to tolerate a statin including Crestor, Lipitor, simvastatin., zetia Myalgias, "inflammation"   Medication intolerances  unable to tolerate metoprolol secondary to memory problems and mood disorder. Unable to tolerate carvedilol secondary to fatigue   Previous  Echocardiogram  for his shortness of breath showed normal function, no significant pulmonary hypertension. Normal right ventricular systolic pressure.  PMH:   has a past medical history of Angina, Arthritis, BPH (benign prostatic hypertrophy), Carpal tunnel syndrome, Coronary artery disease, GERD (gastroesophageal reflux disease), colonic polyps, Hyperlipidemia, Hypertension, Kidney stones, Morbid obesity (HCC), OSA (obstructive sleep apnea), and Type II diabetes mellitus (HCC).  PSH:    Past Surgical History:  Procedure Laterality Date   CARDIAC CATHETERIZATION     CORONARY ARTERY BYPASS GRAFT  11/12/2011   Procedure: CORONARY ARTERY BYPASS GRAFTING (CABG);  Surgeon: Delight Ovens, MD;  [facility];  Service: Open Heart Surgery;  Laterality: N/A;  Coronary Artery Bypass Graft times three on pump utilizing left internal mammary artery and right saphenous vein harvested endoscopically    CORONARY PRESSURE/FFR STUDY N/A 09/27/2019   Procedure: INTRAVASCULAR PRESSURE WIRE/FFR STUDY;  Surgeon: Iran Ouch, MD;  Location: [facility];  Service: Cardiovascular;  Laterality: N/A;   CORONARY STENT INTERVENTION N/A 09/27/2019   Procedure: CORONARY STENT INTERVENTION;  Surgeon: Iran Ouch, MD;  Location: [facility];  Service: Cardiovascular;  Laterality: N/A;   CORONARY STENT INTERVENTION  N/A 10/01/2019   Procedure: CORONARY STENT INTERVENTION;  Surgeon: Yvonne Kendall, MD;  [facility];   Service: Cardiovascular;  Laterality: N/A;   CORONARY ULTRASOUND/IVUS N/A 10/01/2019   Procedure: Intravascular Ultrasound/IVUS;  Surgeon: Yvonne Kendall, MD;  [facility];  Service: Cardiovascular;  Laterality: N/A;   HERNIA REPAIR     KNEE ARTHROPLASTY Right 11/23/2017   Procedure: COMPUTER ASSISTED TOTAL KNEE ARTHROPLASTY;  Surgeon: Donato Heinz, MD;  Location: [facility];  Service: Orthopedics;  Laterality: Right;   KNEE SURGERY     left   LEFT HEART CATH AND CORONARY ANGIOGRAPHY Left 09/27/2019   Procedure: LEFT HEART CATH AND CORONARY ANGIOGRAPHY;  Surgeon: Antonieta Iba, MD;  Location: [facility];  Service: Cardiovascular;  Laterality: Left;   LEFT HEART CATH AND CORONARY ANGIOGRAPHY N/A 10/01/2019   Procedure: LEFT HEART CATH AND CORONARY ANGIOGRAPHY;  Surgeon: Yvonne Kendall, MD;  [facility];  Service: Cardiovascular;  Laterality: N/A;   MENISCUS REPAIR  03/08   right knee    Current Outpatient Medications  Medication Sig Dispense Refill   Accu-Chek FastClix Lancets MISC Use to obtain blood sugar sample once daily. Dx code E11.8 102 each 4   albuterol (VENTOLIN HFA) 108 (90 Base) MCG/ACT inhaler Inhale 2 puffs into the lungs every 6 (six) hours as needed for wheezing or shortness of breath. 90 g 0   Ascorbic Acid (VITAMIN C) 1000 MG tablet Take 1,000 mg by mouth daily.     aspirin EC 81 MG tablet Take 1 tablet (81 mg total) by mouth daily. 90 tablet 3   clopidogrel (PLAVIX) 75 MG tablet Take 1 tablet (75 mg total) by mouth daily with breakfast. Please call [phone] for an appointment for further refills. 90 tablet 0   Dulaglutide (TRULICITY) 3 MG/0.5ML SOPN Inject 3 mg as directed once a week. 2 mL 11   Evolocumab with Infusor (REPATHA PUSHTRONEX SYSTEM) 420 MG/3.5ML SOCT INJECT ONE APPLICATOR INTO THE SKIN EVERY 30 DAYS 3.5 mL 0   furosemide (LASIX) 40 MG tablet Take 1 tablet (40 mg total) by mouth daily as needed. 90 tablet  0   glucose blood (ACCU-CHEK GUIDE) test strip Use to check blood sugar once daily Dx code E11.8 100 each 4   HYDROcodone-acetaminophen (NORCO/VICODIN) 5-325 MG tablet TAKE ONE TABLET BY MOUTH TWO TIMES DAILY 60 tablet 0   INVOKANA 300 MG TABS tablet TAKE ONE TABLET BY MOUTH ONCE A DAY BEFORE BREAKFAST 90 tablet 3   LORazepam (ATIVAN) 0.5 MG tablet Take 0.5-1 tablets (0.25-0.5 mg total) by mouth 2 (two) times daily as needed for anxiety. 30 tablet 0   metFORMIN (GLUCOPHAGE) 1000 MG tablet TAKE 1 TABLET BY MOUTH TWICE (2) DAILY WITH A MEAL 180 tablet 3   nitroGLYCERIN (NITROSTAT) 0.4 MG SL tablet Place 1 tablet (0.4 mg total) under the tongue every 5 (five) minutes as needed for chest pain. 25 tablet 3   potassium chloride SA (KLOR-CON M) 20 MEQ tablet Take 1 tablet (20 mEq total) by mouth 2 (two) times daily as needed (Take with lasix). 90 tablet 3   predniSONE (DELTASONE) 5 MG tablet TAKE TWO TO FOUR TABLETS BY MOUTH DAILY WITH BREAKFAST 120 tablet 2   tamsulosin (FLOMAX) 0.4 MG CAPS capsule TAKE ONE CAPSULE BY MOUTH ONCE DAILY 90 capsule 3   triamcinolone cream (KENALOG) 0.1 % Apply 1 application topically 2 (two) times daily as needed. (Patient taking differently: Apply 1 application  topically 2 (two) times daily as needed (irritation).) 30 g 0   Zinc 30 MG TABS Take by mouth daily.     No current facility-administered medications for this visit.    Allergies:   Doxazosin mesylate, Tetracyclines & related, Carvedilol, Crestor [rosuvastatin], Doxycycline, Glimepiride, Glipizide, Losartan potassium-hctz, Metoprolol tartrate, Pravastatin, Rosiglitazone maleate, Simvastatin, and Tetracycline   Social History:  The patient  reports that he has never smoked. He has never been exposed to tobacco smoke. He has never used smokeless tobacco. He reports that he does not drink alcohol and does not use drugs.   Family History:   family history includes Coronary artery disease in his father; Prostate  cancer in his father.    Review of Systems: Review of Systems  Constitutional: Negative.   HENT: Negative.    Respiratory: Negative.    Cardiovascular: Negative.   Gastrointestinal: Negative.   Musculoskeletal:  Positive for back pain.  Neurological: Negative.   Psychiatric/Behavioral: Negative.    All other systems reviewed and are negative.   PHYSICAL EXAM: VS:  BP 120/62 (BP Location: Left Arm, Patient Position: Sitting, Cuff Size: Normal)   Pulse 69   Ht 5\' 5"  (1.651 m)   Wt 182 lb 8 oz (82.8 kg)   SpO2 98%   BMI 30.37 kg/m  , BMI Body mass index is 30.37 kg/m. Constitutional:  oriented to person, place, and time. No distress.  HENT:  Head: Grossly normal Eyes:  no discharge. No scleral icterus.  Neck: No JVD, no carotid bruits  Cardiovascular: Regular rate and rhythm, no murmurs appreciated Pulmonary/Chest: Clear to auscultation bilaterally, no wheezes or rails Abdominal: Soft.  no distension.  no tenderness.  Musculoskeletal: Normal range of motion Neurological:  normal muscle tone. Coordination normal. No atrophy Skin: Skin warm and dry Psychiatric: normal affect, pleasant  Recent Labs: No results found for requested labs within last 365 days.    Lipid Panel Lab Results  Component Value Date   CHOL 115 03/30/2022   HDL 43.80 03/30/2022   LDLCALC 32 03/30/2022   TRIG 199.0 (H) 03/30/2022      Wt Readings from Last 3 Encounters:  04/18/23 182 lb 8 oz (82.8 kg)  01/18/23 192 lb (87.1 kg)  12/21/22 195 lb (88.5 kg)     ASSESSMENT AND PLAN:  Essential hypertension -  Blood pressure is well controlled on today's visit. No changes made to the medications.  Coronary artery disease involving native coronary artery of native heart without angina pectoris -  Currently with no symptoms of angina. No further workup at this time. Continue current medication regimen. on the Repatha once a month with good results  Pure hypercholesterolemia Continue Repatha  once monthly dosing, numbers at goal in 2023  OSA (obstructive sleep apnea) Unclear if he is on CPAP  Uncontrolled type 2 diabetes mellitus with complication, without long-term current use of insulin (HCC) A1c trending higher, on prednisone 20 daily Working with Dr. 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    Signed, Dossie Arbour, M.D., Ph.D. 04/18/2023  [facility],  [place] [phone]  \

## 2023-04-18 ENCOUNTER — Encounter: Payer: Self-pay | Admitting: Cardiovascular Disease

## 2023-04-18 ENCOUNTER — Ambulatory Visit: Payer: Medicare HMO | Attending: Cardiovascular Disease | Admitting: Cardiovascular Disease

## 2023-04-18 VITALS — BP 120/62 | HR 69 | Ht 65.0 in | Wt 182.5 lb

## 2023-04-18 DIAGNOSIS — R0600 Dyspnea, unspecified: Secondary | ICD-10-CM

## 2023-04-18 DIAGNOSIS — M791 Myalgia, unspecified site: Secondary | ICD-10-CM

## 2023-04-18 DIAGNOSIS — I25118 Atherosclerotic heart disease of native coronary artery with other forms of angina pectoris: Secondary | ICD-10-CM

## 2023-04-18 DIAGNOSIS — G4733 Obstructive sleep apnea (adult) (pediatric): Secondary | ICD-10-CM

## 2023-04-18 DIAGNOSIS — T466X5D Adverse effect of antihyperlipidemic and antiarteriosclerotic drugs, subsequent encounter: Secondary | ICD-10-CM

## 2023-04-18 DIAGNOSIS — E785 Hyperlipidemia, unspecified: Secondary | ICD-10-CM

## 2023-04-18 DIAGNOSIS — Z951 Presence of aortocoronary bypass graft: Secondary | ICD-10-CM

## 2023-04-18 DIAGNOSIS — I10 Essential (primary) hypertension: Secondary | ICD-10-CM

## 2023-04-18 DIAGNOSIS — T466X5A Adverse effect of antihyperlipidemic and antiarteriosclerotic drugs, initial encounter: Secondary | ICD-10-CM

## 2023-04-18 MED ORDER — FUROSEMIDE 40 MG PO TABS: 40.0000 mg | ORAL_TABLET | Freq: Every day | ORAL | 3 refills | Status: DC | PRN

## 2023-04-18 MED ORDER — POTASSIUM CHLORIDE CRYS ER 20 MEQ PO TBCR: 20.0000 meq | EXTENDED_RELEASE_TABLET | Freq: Two times a day (BID) | ORAL | 3 refills | Status: DC | PRN

## 2023-04-18 MED ORDER — CLOPIDOGREL BISULFATE 75 MG PO TABS: 75.0000 mg | ORAL_TABLET | Freq: Every day | ORAL | 3 refills | Status: DC

## 2023-04-18 MED ORDER — REPATHA PUSHTRONEX SYSTEM 420 MG/3.5ML ~~LOC~~ SOCT: SUBCUTANEOUS | 6 refills | Status: DC

## 2023-04-18 NOTE — Patient Instructions (Signed)
Medication Instructions:  No changes  If you need a refill on your cardiac medications before your next appointment, please call your pharmacy.   Lab work: No new labs needed  Testing/Procedures: No new testing needed  Follow-Up: At [facility], you and your health needs are our priority.  As part of our continuing mission to provide you with exceptional heart care, we have created designated Provider Care Teams.  These Care Teams include your primary Cardiologist (physician) and Advanced Practice Providers (APPs -  Physician Assistants and Nurse Practitioners) who all work together to provide you with the care you need, when you need it.  You will need a follow up appointment in 12 months  Providers on your designated Care Team:   Christopher Berge, NP Ryan Dunn, PA-C Cadence Furth, PA-C  COVID-19 Vaccine Information can be found at: [email or web address] For questions related to vaccine distribution or appointments, please email [email or web address] or call [phone].   

## 2023-04-19 ENCOUNTER — Encounter: Payer: Medicare HMO | Admitting: Internal Medicine

## 2023-04-19 ENCOUNTER — Ambulatory Visit (INDEPENDENT_AMBULATORY_CARE_PROVIDER_SITE_OTHER): Payer: Medicare HMO | Admitting: Internal Medicine

## 2023-04-19 ENCOUNTER — Encounter: Payer: Self-pay | Admitting: Internal Medicine

## 2023-04-19 VITALS — BP 130/80 | HR 66 | Temp 97.2°F | Ht 65.0 in | Wt 180.4 lb

## 2023-04-19 DIAGNOSIS — Z7985 Long-term (current) use of injectable non-insulin antidiabetic drugs: Secondary | ICD-10-CM

## 2023-04-19 DIAGNOSIS — G894 Chronic pain syndrome: Secondary | ICD-10-CM

## 2023-04-19 DIAGNOSIS — G4733 Obstructive sleep apnea (adult) (pediatric): Secondary | ICD-10-CM | POA: Diagnosis not present

## 2023-04-19 DIAGNOSIS — R195 Other fecal abnormalities: Secondary | ICD-10-CM

## 2023-04-19 DIAGNOSIS — Z7984 Long term (current) use of oral hypoglycemic drugs: Secondary | ICD-10-CM | POA: Diagnosis not present

## 2023-04-19 DIAGNOSIS — Z0000 Encounter for general adult medical examination without abnormal findings: Secondary | ICD-10-CM

## 2023-04-19 DIAGNOSIS — I25118 Atherosclerotic heart disease of native coronary artery with other forms of angina pectoris: Secondary | ICD-10-CM

## 2023-04-19 DIAGNOSIS — E1159 Type 2 diabetes mellitus with other circulatory complications: Secondary | ICD-10-CM | POA: Diagnosis not present

## 2023-04-19 DIAGNOSIS — M353 Polymyalgia rheumatica: Secondary | ICD-10-CM | POA: Diagnosis not present

## 2023-04-19 DIAGNOSIS — F1120 Opioid dependence, uncomplicated: Secondary | ICD-10-CM

## 2023-04-19 DIAGNOSIS — L570 Actinic keratosis: Secondary | ICD-10-CM | POA: Diagnosis not present

## 2023-04-19 DIAGNOSIS — D6869 Other thrombophilia: Secondary | ICD-10-CM

## 2023-04-19 LAB — COMPREHENSIVE METABOLIC PANEL
ALT: 21 U/L (ref 0–53)
AST: 11 U/L (ref 0–37)
Albumin: 4 g/dL (ref 3.5–5.2)
Alkaline Phosphatase: 74 U/L (ref 39–117)
BUN: 21 mg/dL (ref 6–23)
CO2: 30 mEq/L (ref 19–32)
Calcium: 9.2 mg/dL (ref 8.4–10.5)
Chloride: 100 mEq/L (ref 96–112)
Creatinine, Ser: 0.9 mg/dL (ref 0.40–1.50)
GFR: 85.32 mL/min (ref >60.00)
Glucose, Bld: 190 mg/dL — ABNORMAL HIGH (ref 70–99)
Potassium: 3.9 mEq/L (ref 3.5–5.1)
Sodium: 139 mEq/L (ref 135–145)
Total Bilirubin: 0.6 mg/dL (ref 0.2–1.2)
Total Protein: 6.3 g/dL (ref 6.0–8.3)

## 2023-04-19 LAB — CBC
HCT: 48.9 % (ref 39.0–52.0)
Hemoglobin: 15.9 g/dL (ref 13.0–17.0)
MCHC: 32.5 g/dL (ref 30.0–36.0)
MCV: 88.6 fl (ref 78.0–100.0)
Platelets: 244 10*3/uL (ref 150.0–400.0)
RBC: 5.51 Mil/uL (ref 4.22–5.81)
RDW: 16.5 % — ABNORMAL HIGH (ref 11.5–15.5)
WBC: 9.5 10*3/uL (ref 4.0–10.5)

## 2023-04-19 LAB — HM DIABETES FOOT EXAM

## 2023-04-19 LAB — MICROALBUMIN / CREATININE URINE RATIO
Creatinine,U: 45.7 mg/dL
Microalb Creat Ratio: 21.6 mg/g (ref 0.0–30.0)
Microalb, Ur: 9.9 mg/dL — ABNORMAL HIGH (ref 0.0–1.9)

## 2023-04-19 LAB — SEDIMENTATION RATE: Sed Rate: 8 mm/h (ref 0–20)

## 2023-04-19 LAB — LIPID PANEL
Cholesterol: 135 mg/dL (ref 0–200)
HDL: 50.2 mg/dL (ref >39.00)
LDL Cholesterol: 60 mg/dL (ref 0–99)
NonHDL: 85.26
Total CHOL/HDL Ratio: 3
Triglycerides: 124 mg/dL (ref 0.0–149.0)
VLDL: 24.8 mg/dL (ref 0.0–40.0)

## 2023-04-19 LAB — HEMOGLOBIN A1C: Hgb A1c MFr Bld: 10.4 % — ABNORMAL HIGH (ref 4.6–6.5)

## 2023-04-19 LAB — TSH: TSH: 3.64 u[IU]/mL (ref 0.35–5.50)

## 2023-04-19 NOTE — Assessment & Plan Note (Signed)
Still with poor control Will check A1c on trulicity 3mg  weekly, metformin 1000mg  bid  and invokana 300mg  daily Will add glipizide 5mg  if still elevated

## 2023-04-19 NOTE — Assessment & Plan Note (Signed)
I have personally reviewed the Medicare Annual Wellness questionnaire and have noted 1. The patient's medical and social history 2. Their use of alcohol, tobacco or illicit drugs 3. Their current medications and supplements 4. The patient's functional ability including ADL's, fall risks, home safety risks and hearing or visual             impairment. 5. Diet and physical activities 6. Evidence for depression or mood disorders  The patients weight, height, BMI and visual acuity have been recorded in the chart I have made referrals, counseling and provided education to the patient based review of the above and I have provided the pt with a written personalized care plan for preventive services.  I have provided you with a copy of your personalized plan for preventive services. Please take the time to review along with your updated medication list.  Never got the colon despite the positive FIT--will refer again No PSA due to age Stays active on farm Prefers no flu/COVID/RSV vaccines

## 2023-04-19 NOTE — Assessment & Plan Note (Signed)
Much better on the prednisone but hasn't been able to wean from 10 bid If can't go down, will need to try methotrexate

## 2023-04-19 NOTE — Assessment & Plan Note (Signed)
On ear Discussed options and he gave verbal consent Liquid nitrogen 20 seconds x 2 If recurs, will need derm evaluation

## 2023-04-19 NOTE — Assessment & Plan Note (Signed)
Easy bruising on ASA/plavix

## 2023-04-19 NOTE — Assessment & Plan Note (Signed)
Chronic daily pain Uses the hydrocodone 1-2 per day

## 2023-04-19 NOTE — Assessment & Plan Note (Signed)
PDMP reviewed No concerns 

## 2023-04-19 NOTE — Progress Notes (Signed)
Subjective:    Patient ID: [name] [name], male    DOB: [birth year], 72 y.o.   MRN: [id number]  HPI Here for Medicare wellness visit and follow up of chronic health conditions Reviewed advanced directives Reviewed other doctors--Dr Gollan-cardiology, Dr Tally Due, Dr Chasnis/Meeler--physiatry, Dr Ranae Palms, Dr Joseph Berkshire, Dr Jonny Ruiz Beshel--chiropractor No surgery or hospitalizations this year Vision is fair---cataract on left, not ready Hearing is fine No alcohol or tobacco Stays active with farm--no separate exercise No falls No depression or anhedonia Independent with instrumental ADLs No worrisome memory problems  Still affected by the polymyalgia Tried to cut back on prednisone but symptoms really came back Taking 10 bid---plans to continue to try to wean  Has sore spot on ear he wants checked--stays sore Chronic place on back---getting bigger  Checking sugars Running high --as high as 300 Has changed diet--weight is down some  No recent chest pain--saw Dr Mariah Milling yesterday No SOB Some dizziness ---not clear relation to eating or standing up. No syncope No edema--hasn't needed lasix in a while No palpitations  Back pain is "terrible" Goes along his hip and will stay for a while Hydrocodone once a day usually--occ second dose  Current Outpatient Medications on File Prior to Visit  Medication Sig Dispense Refill   Accu-Chek FastClix Lancets MISC Use to obtain blood sugar sample once daily. Dx code E11.8 102 each 4   albuterol (VENTOLIN HFA) 108 (90 Base) MCG/ACT inhaler Inhale 2 puffs into the lungs every 6 (six) hours as needed for wheezing or shortness of breath. 90 g 0   Ascorbic Acid (VITAMIN C) 1000 MG tablet Take 1,000 mg by mouth daily.     aspirin EC 81 MG tablet Take 1 tablet (81 mg total) by mouth daily. 90 tablet 3   clopidogrel (PLAVIX) 75 MG tablet Take 1 tablet (75 mg total) by mouth daily with breakfast. Please call [phone] for an  appointment for further refills. 90 tablet 3   Dulaglutide (TRULICITY) 3 MG/0.5ML SOPN Inject 3 mg as directed once a week. 2 mL 11   Evolocumab with Infusor (REPATHA PUSHTRONEX SYSTEM) 420 MG/3.5ML SOCT INJECT ONE APPLICATOR INTO THE SKIN EVERY 30 DAYS 3.5 mL 6   furosemide (LASIX) 40 MG tablet Take 1 tablet (40 mg total) by mouth daily as needed. 90 tablet 3   glucose blood (ACCU-CHEK GUIDE) test strip Use to check blood sugar once daily Dx code E11.8 100 each 4   HYDROcodone-acetaminophen (NORCO/VICODIN) 5-325 MG tablet TAKE ONE TABLET BY MOUTH TWO TIMES DAILY 60 tablet 0   INVOKANA 300 MG TABS tablet TAKE ONE TABLET BY MOUTH ONCE A DAY BEFORE BREAKFAST 90 tablet 3   LORazepam (ATIVAN) 0.5 MG tablet Take 0.5-1 tablets (0.25-0.5 mg total) by mouth 2 (two) times daily as needed for anxiety. 30 tablet 0   metFORMIN (GLUCOPHAGE) 1000 MG tablet TAKE 1 TABLET BY MOUTH TWICE (2) DAILY WITH A MEAL 180 tablet 3   nitroGLYCERIN (NITROSTAT) 0.4 MG SL tablet Place 1 tablet (0.4 mg total) under the tongue every 5 (five) minutes as needed for chest pain. 25 tablet 3   potassium chloride SA (KLOR-CON M) 20 MEQ tablet Take 1 tablet (20 mEq total) by mouth 2 (two) times daily as needed (Take with lasix). 90 tablet 3   predniSONE (DELTASONE) 5 MG tablet TAKE TWO TO FOUR TABLETS BY MOUTH DAILY WITH BREAKFAST 120 tablet 2   tamsulosin (FLOMAX) 0.4 MG CAPS capsule TAKE ONE CAPSULE BY MOUTH ONCE DAILY 90 capsule 3  triamcinolone cream (KENALOG) 0.1 % Apply 1 application topically 2 (two) times daily as needed. (Patient taking differently: Apply 1 application  topically 2 (two) times daily as needed (irritation).) 30 g 0   Zinc 30 MG TABS Take by mouth daily.     No current facility-administered medications on file prior to visit.    Allergies  Allergen Reactions   Doxazosin Mesylate Other (See Comments)    REACTION: didn't work and may have caused SOB   Tetracyclines & Related    Carvedilol Other (See  Comments)    Felt bad   Crestor [Rosuvastatin] Other (See Comments)    Muscle aches.   Doxycycline Other (See Comments) and Rash    REACTION: unspecified   Glimepiride Other (See Comments)    REACTION: red eyes   Glipizide Other (See Comments)    REACTION: myalgia??   Losartan Potassium-Hctz Other (See Comments)    REACTION: chest \\T \ leg pain   Metoprolol Tartrate Other (See Comments)    Mood swings, memory changes   Pravastatin Other (See Comments)    Muscle aches    Rosiglitazone Maleate Other (See Comments)    REACTION: myalgia   Simvastatin Other (See Comments)    REACTION: myalgias   Tetracycline Rash    Past Medical History:  Diagnosis Date   Angina    Arthritis    BPH (benign prostatic hypertrophy)    Carpal tunnel syndrome    Coronary artery disease    a. 11/2011 s/p CABG x 3  (LIMA-LAD, SeqSVG-D1-D3); b. 09/2019 CATH-RCA PCI: LM 50ost, LAD 100ost/p, Sev diff diag dzs, LCX nl, OM1 72m, OM3 40, RCA 70ost (iFR 0.88--> 3.0x26 Resolute Onyx DES), 75m, LIMA->LAD ok. VG->D1->D3 ok. EF 50-55%.; c) 10/01/2019: staged PCI Ost LM (Resolute Onyx 3.5 x 12 - 4.2 mm) & Ost OM1 (Resolute Onyx 2.25 x 26 -- 2.5 mm)   GERD (gastroesophageal reflux disease)    Hx of colonic polyps    Hyperlipidemia    Hypertension    Kidney stones    Morbid obesity (HCC)    OSA (obstructive sleep apnea)    uses cpap   Type II diabetes mellitus (HCC)     Past Surgical History:  Procedure Laterality Date   CARDIAC CATHETERIZATION     CORONARY ARTERY BYPASS GRAFT  11/12/2011   Procedure: CORONARY ARTERY BYPASS GRAFTING (CABG);  Surgeon: Delight Ovens, MD;  Location: [facility] OR;  Service: Open Heart Surgery;  Laterality: N/A;  Coronary Artery Bypass Graft times three on pump utilizing left internal mammary artery and right saphenous vein harvested endoscopically    CORONARY PRESSURE/FFR STUDY N/A 09/27/2019   Procedure: INTRAVASCULAR PRESSURE WIRE/FFR STUDY;  Surgeon: Iran Ouch, MD;   Location: [facility] INVASIVE CV LAB;  Service: Cardiovascular;  Laterality: N/A;   CORONARY STENT INTERVENTION N/A 09/27/2019   Procedure: CORONARY STENT INTERVENTION;  Surgeon: Iran Ouch, MD;  Location: [facility] INVASIVE CV LAB;  Service: Cardiovascular;  Laterality: N/A;   CORONARY STENT INTERVENTION N/A 10/01/2019   Procedure: CORONARY STENT INTERVENTION;  Surgeon: Yvonne Kendall, MD;  Location: [facility] LAB;  Service: Cardiovascular;  Laterality: N/A;   CORONARY ULTRASOUND/IVUS N/A 10/01/2019   Procedure: Intravascular Ultrasound/IVUS;  Surgeon: Yvonne Kendall, MD;  Location: [facility];  Service: Cardiovascular;  Laterality: N/A;   HERNIA REPAIR     KNEE ARTHROPLASTY Right 11/23/2017   Procedure: COMPUTER ASSISTED TOTAL KNEE ARTHROPLASTY;  Surgeon: Donato Heinz, MD;  Location: [facility] ORS;  Service: Orthopedics;  Laterality:  Right;   KNEE SURGERY     left   LEFT HEART CATH AND CORONARY ANGIOGRAPHY Left 09/27/2019   Procedure: LEFT HEART CATH AND CORONARY ANGIOGRAPHY;  Surgeon: Antonieta Iba, MD;  Location: [facility] INVASIVE CV [facility];  Service: Cardiovascular;  Laterality: Left;   LEFT HEART CATH AND CORONARY ANGIOGRAPHY N/A 10/01/2019   Procedure: LEFT HEART CATH AND CORONARY ANGIOGRAPHY;  Surgeon: Yvonne Kendall, MD;  Location: [facility];  Service: Cardiovascular;  Laterality: N/A;   MENISCUS REPAIR  03/08   right knee    Family History  Problem Relation Age of Onset   Coronary artery disease Father    Prostate cancer Father    Diabetes Neg Hx    Hypertension Neg Hx     Social History   Socioeconomic History   Marital status: Married    Spouse name: Not on file   Number of children: 3   Years of education: Not on file   Highest education level: Not on file  Occupational History   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   Occupation: Biochemist, clinical &/or corn farming  Tobacco Use   Smoking status: Never    Passive exposure: Never   Smokeless  tobacco: Never  Vaping Use   Vaping Use: Never used  Substance and Sexual Activity   Alcohol use: No   Drug use: No   Sexual activity: Yes  Other Topics Concern   Not on file  Social History Narrative   No living will   Requests wife as health care POA--no clear alternate   Would accept resuscitation   Would accept tube feedings--not sure about duration   Social Determinants of Health   Financial Resource Strain: Not on file  Food Insecurity: No Food Insecurity (08/03/2022)   Hunger Vital Sign    Worried About Running Out of Food in the Last Year: Never true    Ran Out of Food in the Last Year: Never true  Transportation Needs: No Transportation Needs (08/03/2022)   PRAPARE - Administrator, Civil Service (Medical): No    Lack of Transportation (Non-Medical): No  Physical Activity: Not on file  Stress: Not on file  Social Connections: Not on file  Intimate Partner Violence: Not on file   Review of Systems Appetite is good Weight is down some Sleeps okay Wears seat belt sometimes---discussed safety overall Dentures--no dentist Occasional heartburn--tums works. No dysphagia Bowels move okay---slow with narcotic. Uses dulcolax prn. No blood Voids okay----but nocturia x 4-5 now on prednisone. Doesn't use salt    Objective:   Physical Exam Constitutional:      Appearance: Normal appearance.  HENT:     Mouth/Throat:     Pharynx: No oropharyngeal exudate or posterior oropharyngeal erythema.  Eyes:     Conjunctiva/sclera: Conjunctivae normal.     Pupils: Pupils are equal, round, and reactive to light.  Cardiovascular:     Rate and Rhythm: Normal rate and regular rhythm.     Pulses: Normal pulses.     Heart sounds: No murmur heard.    No gallop.  Pulmonary:     Effort: Pulmonary effort is normal.     Breath sounds: Normal breath sounds. No wheezing or rales.  Abdominal:     Palpations: Abdomen is soft.     Tenderness: There is no abdominal tenderness.   Musculoskeletal:     Cervical back: Neck supple.     Right lower leg: No edema.     Left  lower leg: No edema.  Lymphadenopathy:     Cervical: No cervical adenopathy.  Skin:    Findings: No lesion or rash.     Comments: No foot lesions 1 mm crusted lesion on upper right pinna Cyst mid back Extensive bruising and ecchymoses--mostly on forearms  Neurological:     General: No focal deficit present.     Mental Status: He is alert and oriented to person, place, and time.     Comments: Word naming-- 7/1 minute Recall 3/3 Fairly normal sensation in feet  Psychiatric:        Mood and Affect: Mood normal.        Behavior: Behavior normal.            Assessment & Plan:

## 2023-04-19 NOTE — Assessment & Plan Note (Signed)
Sleeps with CPAP nightly 

## 2023-04-19 NOTE — Assessment & Plan Note (Signed)
No recent angina on the repatha monthly, ASA, plavix

## 2023-04-20 ENCOUNTER — Other Ambulatory Visit: Payer: Self-pay | Admitting: Internal Medicine

## 2023-04-20 MED ORDER — GLIPIZIDE ER 5 MG PO TB24: 5.0000 mg | ORAL_TABLET | Freq: Every day | ORAL | 5 refills | Status: DC

## 2023-05-11 DIAGNOSIS — W57XXXA Bitten or stung by nonvenomous insect and other nonvenomous arthropods, initial encounter: Secondary | ICD-10-CM | POA: Diagnosis not present

## 2023-05-11 DIAGNOSIS — M79604 Pain in right leg: Secondary | ICD-10-CM | POA: Diagnosis not present

## 2023-05-12 DIAGNOSIS — M79604 Pain in right leg: Secondary | ICD-10-CM | POA: Diagnosis not present

## 2023-05-12 DIAGNOSIS — W57XXXA Bitten or stung by nonvenomous insect and other nonvenomous arthropods, initial encounter: Secondary | ICD-10-CM | POA: Diagnosis not present

## 2023-05-13 ENCOUNTER — Encounter: Payer: Self-pay | Admitting: Family Medicine

## 2023-05-13 ENCOUNTER — Ambulatory Visit (INDEPENDENT_AMBULATORY_CARE_PROVIDER_SITE_OTHER): Payer: Medicare HMO | Admitting: Family Medicine

## 2023-05-13 VITALS — BP 130/66 | HR 75 | Temp 98.6°F | Resp 16 | Ht 65.0 in | Wt 179.4 lb

## 2023-05-13 DIAGNOSIS — R21 Rash and other nonspecific skin eruption: Secondary | ICD-10-CM | POA: Insufficient documentation

## 2023-05-13 LAB — CBC WITH DIFFERENTIAL/PLATELET
Basophils Absolute: 0 10*3/uL (ref 0.0–0.1)
Basophils Relative: 0.6 % (ref 0.0–3.0)
Eosinophils Absolute: 0 10*3/uL (ref 0.0–0.7)
Eosinophils Relative: 0.5 % (ref 0.0–5.0)
HCT: 46.9 % (ref 39.0–52.0)
Hemoglobin: 15.2 g/dL (ref 13.0–17.0)
Lymphocytes Relative: 20.3 % (ref 12.0–46.0)
Lymphs Abs: 1.6 10*3/uL (ref 0.7–4.0)
MCHC: 32.5 g/dL (ref 30.0–36.0)
MCV: 88.7 fl (ref 78.0–100.0)
Monocytes Absolute: 0.6 10*3/uL (ref 0.1–1.0)
Monocytes Relative: 7.4 % (ref 3.0–12.0)
Neutro Abs: 5.6 10*3/uL (ref 1.4–7.7)
Neutrophils Relative %: 71.2 % (ref 43.0–77.0)
Platelets: 224 10*3/uL (ref 150.0–400.0)
RBC: 5.29 Mil/uL (ref 4.22–5.81)
RDW: 15.4 % (ref 11.5–15.5)
WBC: 7.9 10*3/uL (ref 4.0–10.5)

## 2023-05-13 NOTE — Assessment & Plan Note (Signed)
Acute, slight improvement per patient after starting amoxicillin 3 times daily.  Contacted urgent care office but Lyme disease and [facility] spotted fever titers not returned. Rash today looks petechial and almost like a vasculitis.  Will evaluate with CBC.  Patient with nonspecific symptoms that could be related to tickborne illness. Rash if cellulitis certainly is not worsening. Recommended elevation, continued antibiotics and return and ER precautions provided.

## 2023-05-13 NOTE — Progress Notes (Signed)
Patient ID: [name] [name], male    DOB: 06-14-1951, 72 y.o.   MRN: [id number]  This visit was conducted in person.  BP 130/66   Pulse 75   Temp 98.6 F (37 C)   Resp 16   Ht 5\' 5"  (1.651 m)   Wt 179 lb 6 oz (81.4 kg)   SpO2 98%   BMI 29.85 kg/m    CC:  Chief Complaint  Patient presents with   Rash    On right leg X Tuesday. Pt did went to [facility] to touch it. Pt states that it itched last night but not to bad.    Subjective:   HPI: [name] [name] is a 72 y.o. male presenting on 05/13/2023 for Rash (On right leg X Tuesday. Pt did went to [facility] to touch it. Pt states that it itched last night but not to bad.)  New onset rash on right leg.Marland Kitchen area was sore.. woke him at night.  Noted swelling and pain. Recent Urgent Car OV on  6/5  Started on antibiotics.Marland Kitchen amoxicillin .Marland Kitchen Not sure dose but likely 500 mg TID. .. Only on 1 full day so far. No SE. He is allergic to doxycycline.  He feel it is slightly better, no spreading.  Minimally itchy.  No fever, chill, feeling tired but not flu like.. no arthralgia, headache, neck stiffness, numbness, weakness.    Some consideration of lyme disease as  he gets tick bites... no target sign.      Relevant past medical, surgical, family and social history reviewed and updated as indicated. Interim medical history since our last visit reviewed. Allergies and medications reviewed and updated. Outpatient Medications Prior to Visit  Medication Sig Dispense Refill   Accu-Chek FastClix Lancets MISC Use to obtain blood sugar sample once daily. Dx code E11.8 102 each 4   albuterol (VENTOLIN HFA) 108 (90 Base) MCG/ACT inhaler Inhale 2 puffs into the lungs every 6 (six) hours as needed for wheezing or shortness of breath. 90 g 0   Ascorbic Acid (VITAMIN C) 1000 MG tablet Take 1,000 mg by mouth daily.     aspirin EC 81 MG tablet Take 1 tablet (81 mg total) by mouth daily. 90 tablet 3   clopidogrel (PLAVIX) 75 MG tablet Take 1 tablet  (75 mg total) by mouth daily with breakfast. Please call [phone] for an appointment for further refills. 90 tablet 3   Dulaglutide (TRULICITY) 3 MG/0.5ML SOPN Inject 3 mg as directed once a week. 2 mL 11   Evolocumab with Infusor (REPATHA PUSHTRONEX SYSTEM) 420 MG/3.5ML SOCT INJECT ONE APPLICATOR INTO THE SKIN EVERY 30 DAYS 3.5 mL 6   furosemide (LASIX) 40 MG tablet Take 1 tablet (40 mg total) by mouth daily as needed. 90 tablet 3   glipiZIDE (GLUCOTROL XL) 5 MG 24 hr tablet Take 1 tablet (5 mg total) by mouth daily with breakfast. 30 tablet 5   glucose blood (ACCU-CHEK GUIDE) test strip Use to check blood sugar once daily Dx code E11.8 100 each 4   HYDROcodone-acetaminophen (NORCO/VICODIN) 5-325 MG tablet TAKE ONE TABLET BY MOUTH TWO TIMES DAILY 60 tablet 0   INVOKANA 300 MG TABS tablet TAKE ONE TABLET BY MOUTH ONCE A DAY BEFORE BREAKFAST 90 tablet 3   LORazepam (ATIVAN) 0.5 MG tablet Take 0.5-1 tablets (0.25-0.5 mg total) by mouth 2 (two) times daily as needed for anxiety. 30 tablet 0   metFORMIN (GLUCOPHAGE) 1000 MG tablet TAKE 1 TABLET BY MOUTH  TWICE (2) DAILY WITH A MEAL 180 tablet 3   nitroGLYCERIN (NITROSTAT) 0.4 MG SL tablet Place 1 tablet (0.4 mg total) under the tongue every 5 (five) minutes as needed for chest pain. 25 tablet 3   potassium chloride SA (KLOR-CON M) 20 MEQ tablet Take 1 tablet (20 mEq total) by mouth 2 (two) times daily as needed (Take with lasix). 90 tablet 3   predniSONE (DELTASONE) 5 MG tablet TAKE TWO TO FOUR TABLETS BY MOUTH DAILY WITH BREAKFAST 120 tablet 2   tamsulosin (FLOMAX) 0.4 MG CAPS capsule TAKE ONE CAPSULE BY MOUTH ONCE DAILY 90 capsule 3   triamcinolone cream (KENALOG) 0.1 % Apply 1 application topically 2 (two) times daily as needed. (Patient taking differently: Apply 1 application  topically 2 (two) times daily as needed (irritation).) 30 g 0   Zinc 30 MG TABS Take by mouth daily.     No facility-administered medications prior to visit.     Per HPI  unless specifically indicated in ROS section below Review of Systems  Constitutional:  Negative for fatigue and fever.  HENT:  Negative for ear pain.   Eyes:  Negative for pain.  Respiratory:  Negative for cough and shortness of breath.   Cardiovascular:  Negative for chest pain, palpitations and leg swelling.  Gastrointestinal:  Negative for abdominal pain.  Genitourinary:  Negative for dysuria.  Musculoskeletal:  Negative for arthralgias.  Skin:  Positive for rash.  Neurological:  Negative for syncope, light-headedness and headaches.  Psychiatric/Behavioral:  Negative for dysphoric mood.    Objective:  BP 130/66   Pulse 75   Temp 98.6 F (37 C)   Resp 16   Ht 5\' 5"  (1.651 m)   Wt 179 lb 6 oz (81.4 kg)   SpO2 98%   BMI 29.85 kg/m   Wt Readings from Last 3 Encounters:  05/13/23 179 lb 6 oz (81.4 kg)  04/19/23 180 lb 6 oz (81.8 kg)  04/18/23 182 lb 8 oz (82.8 kg)      Physical Exam Constitutional:      Appearance: He is well-developed.  HENT:     Head: Normocephalic.     Right Ear: Hearing normal.     Left Ear: Hearing normal.     Nose: Nose normal.  Neck:     Thyroid: No thyroid mass or thyromegaly.     Vascular: No carotid bruit.     Trachea: Trachea normal.  Cardiovascular:     Rate and Rhythm: Normal rate and regular rhythm.     Pulses: Normal pulses.     Heart sounds: Heart sounds not distant. No murmur heard.    No friction rub. No gallop.     Comments: No peripheral edema Pulmonary:     Effort: Pulmonary effort is normal. No respiratory distress.     Breath sounds: Normal breath sounds.  Skin:    General: Skin is warm and dry.     Findings: Rash present. Rash is macular and purpuric. Rash is not pustular or vesicular.  Psychiatric:        Speech: Speech normal.        Behavior: Behavior normal.        Thought Content: Thought content normal.          Results for orders placed or performed in visit on 04/19/23  Sedimentation rate  Result Value  Ref Range   Sed Rate 8 0 - 20 mm/hr  CBC  Result Value Ref Range   WBC 9.5  4.0 - 10.5 K/uL   RBC 5.51 4.22 - 5.81 Mil/uL   Platelets 244.0 150.0 - 400.0 K/uL   Hemoglobin 15.9 13.0 - 17.0 g/dL   HCT 16.1 09.6 - 04.5 %   MCV 88.6 78.0 - 100.0 fl   MCHC 32.5 30.0 - 36.0 g/dL   RDW 40.9 (H) 81.1 - 91.4 %  Comprehensive metabolic panel  Result Value Ref Range   Sodium 139 135 - 145 mEq/L   Potassium 3.9 3.5 - 5.1 mEq/L   Chloride 100 96 - 112 mEq/L   CO2 30 19 - 32 mEq/L   Glucose, Bld 190 (H) 70 - 99 mg/dL   BUN 21 6 - 23 mg/dL   Creatinine, Ser 7.82 0.40 - 1.50 mg/dL   Total Bilirubin 0.6 0.2 - 1.2 mg/dL   Alkaline Phosphatase 74 39 - 117 U/L   AST 11 0 - 37 U/L   ALT 21 0 - 53 U/L   Total Protein 6.3 6.0 - 8.3 g/dL   Albumin 4.0 3.5 - 5.2 g/dL   GFR 95.62 >13.08 mL/min   Calcium 9.2 8.4 - 10.5 mg/dL  Lipid panel  Result Value Ref Range   Cholesterol 135 0 - 200 mg/dL   Triglycerides 657.8 0.0 - 149.0 mg/dL   HDL 46.96 >29.52 mg/dL   VLDL 84.1 0.0 - 32.4 mg/dL   LDL Cholesterol 60 0 - 99 mg/dL   Total CHOL/HDL Ratio 3    NonHDL 85.26   Hemoglobin A1c  Result Value Ref Range   Hgb A1c MFr Bld 10.4 (H) 4.6 - 6.5 %  TSH  Result Value Ref Range   TSH 3.64 0.35 - 5.50 uIU/mL  Microalbumin / creatinine urine ratio  Result Value Ref Range   Microalb, Ur 9.9 (H) 0.0 - 1.9 mg/dL   Creatinine,U 40.1 mg/dL   Microalb Creat Ratio 21.6 0.0 - 30.0 mg/g  HM DIABETES FOOT EXAM  Result Value Ref Range   HM Diabetic Foot Exam done     Assessment and Plan  Rash Assessment & Plan: Acute, slight improvement per patient after starting amoxicillin 3 times daily.  Contacted urgent care office but Lyme disease and [facility] spotted fever titers not returned. Rash today looks petechial and almost like a vasculitis.  Will evaluate with CBC.  Patient with nonspecific symptoms that could be related to tickborne illness. Rash if cellulitis certainly is not worsening. Recommended  elevation, continued antibiotics and return and ER precautions provided.  Orders: -     CBC with Differential/Platelet    No follow-ups on file.   Kerby Nora, MD

## 2023-05-31 ENCOUNTER — Other Ambulatory Visit: Payer: Self-pay | Admitting: Internal Medicine

## 2023-05-31 NOTE — Telephone Encounter (Signed)
Name of Medication: Hydrocodone Name of Pharmacy: [facility] or Written Date and Quantity: 04-04-23 #60 Last Office Visit and Type: 04-19-23 Next Office Visit and Type: 07-20-23 Last Controlled Substance Agreement Date: 10-18-22 Last UDS: 10-18-22

## 2023-06-14 ENCOUNTER — Telehealth: Payer: Self-pay | Admitting: Cardiovascular Disease

## 2023-06-14 NOTE — Telephone Encounter (Signed)
Pt c/o medication issue:  1. Name of Medication: Evolocumab with Infusor (REPATHA PUSHTRONEX SYSTEM) 420 MG/3.5ML SOCT   2. How are you currently taking this medication (dosage and times per day)?    3. Are you having a reaction (difficulty breathing--STAT)? no  4. What is your medication issue? As of June 05, 2023 the push is no longer available only the sure click. So a new prescription would need to be writing for the patient. Please advise

## 2023-06-15 NOTE — Telephone Encounter (Signed)
Thayer Ohm at [facility] called stating they will need a new prescription for Repatha Sure Click as the Repatha Pushtronex is no longer available.  Caller stated patient is out of this medication.

## 2023-06-17 MED ORDER — REPATHA SURECLICK 140 MG/ML ~~LOC~~ SOAJ: 140.0000 mg | SUBCUTANEOUS | 3 refills | Status: DC

## 2023-06-17 NOTE — Telephone Encounter (Signed)
Verbal order received for Repatha 140 mg Sureclick every 2 weeks. Prescription sent to patient's pharmacy.

## 2023-06-17 NOTE — Telephone Encounter (Signed)
Pharmacy calling to f/u on the medication. Please advise.

## 2023-06-20 ENCOUNTER — Encounter: Payer: Self-pay | Admitting: Physician Assistant

## 2023-07-06 ENCOUNTER — Encounter (INDEPENDENT_AMBULATORY_CARE_PROVIDER_SITE_OTHER): Payer: Self-pay

## 2023-07-20 ENCOUNTER — Encounter: Payer: Self-pay | Admitting: Internal Medicine

## 2023-07-20 ENCOUNTER — Ambulatory Visit (INDEPENDENT_AMBULATORY_CARE_PROVIDER_SITE_OTHER): Payer: Medicare HMO | Admitting: Internal Medicine

## 2023-07-20 VITALS — BP 130/84 | HR 71 | Temp 97.7°F | Ht 65.0 in | Wt 177.0 lb

## 2023-07-20 DIAGNOSIS — M353 Polymyalgia rheumatica: Secondary | ICD-10-CM

## 2023-07-20 DIAGNOSIS — E1159 Type 2 diabetes mellitus with other circulatory complications: Secondary | ICD-10-CM

## 2023-07-20 DIAGNOSIS — Z7984 Long term (current) use of oral hypoglycemic drugs: Secondary | ICD-10-CM | POA: Diagnosis not present

## 2023-07-20 DIAGNOSIS — M19012 Primary osteoarthritis, left shoulder: Secondary | ICD-10-CM

## 2023-07-20 DIAGNOSIS — G894 Chronic pain syndrome: Secondary | ICD-10-CM | POA: Diagnosis not present

## 2023-07-20 DIAGNOSIS — F1120 Opioid dependence, uncomplicated: Secondary | ICD-10-CM

## 2023-07-20 LAB — POCT GLYCOSYLATED HEMOGLOBIN (HGB A1C): Hemoglobin A1C: 8.6 % — AB (ref 4.0–5.6)

## 2023-07-20 MED ORDER — TRIAMCINOLONE ACETONIDE 40 MG/ML IJ SUSP
40.0000 mg | Freq: Once | INTRAMUSCULAR | Status: AC
Start: 2023-07-20 — End: 2023-07-20
  Administered 2023-07-20: 40 mg via INTRA_ARTICULAR

## 2023-07-20 MED ORDER — PREDNISONE 5 MG PO TABS: 5.0000 mg | ORAL_TABLET | Freq: Two times a day (BID) | ORAL | 2 refills | Status: DC

## 2023-07-20 NOTE — Progress Notes (Signed)
Subjective:    Patient ID: [name] [name], male    DOB: 15-Oct-1951, 72 y.o.   MRN: [id number]  HPI Here for follow up of poorly controlled diabetes and chronic pain  Seen for cellulitis on leg--at urgent care Then came and saw Dr Ermalene Searing in follow up This is better  Got blisters under his arms---seemed to happen after starting glipizide (also after second shingrix) Stopped the glipizide for a few days--no change--and started back on it Then stopped it again due to worsening rash Did seem to help his sugars (none in 2 weeks)  Pain is worse now in his hands Built a chicken house--thinks it might have been worse since then Now hard to grab things Also was stung by a lot of yellow jackets about a week ago  Was able to cut prednisone down to 5mg  bid  Current Outpatient Medications on File Prior to Visit  Medication Sig Dispense Refill   Accu-Chek FastClix Lancets MISC Use to obtain blood sugar sample once daily. Dx code E11.8 102 each 4   albuterol (VENTOLIN HFA) 108 (90 Base) MCG/ACT inhaler Inhale 2 puffs into the lungs every 6 (six) hours as needed for wheezing or shortness of breath. 90 g 0   Ascorbic Acid (VITAMIN C) 1000 MG tablet Take 1,000 mg by mouth daily.     aspirin EC 81 MG tablet Take 1 tablet (81 mg total) by mouth daily. 90 tablet 3   clopidogrel (PLAVIX) 75 MG tablet Take 1 tablet (75 mg total) by mouth daily with breakfast. Please call [phone] for an appointment for further refills. 90 tablet 3   Dulaglutide (TRULICITY) 3 MG/0.5ML SOPN Inject 3 mg as directed once a week. 2 mL 11   Evolocumab (REPATHA SURECLICK) 140 MG/ML SOAJ Inject 140 mg into the skin every 14 (fourteen) days. 6 mL 3   furosemide (LASIX) 40 MG tablet Take 1 tablet (40 mg total) by mouth daily as needed. 90 tablet 3   glipiZIDE (GLUCOTROL XL) 5 MG 24 hr tablet Take 1 tablet (5 mg total) by mouth daily with breakfast. 30 tablet 5   glucose blood (ACCU-CHEK GUIDE) test strip Use to check  blood sugar once daily Dx code E11.8 100 each 4   HYDROcodone-acetaminophen (NORCO/VICODIN) 5-325 MG tablet TAKE 1 TABLET BY MOUTH TWICE A DAY 60 tablet 0   INVOKANA 300 MG TABS tablet TAKE ONE TABLET BY MOUTH ONCE A DAY BEFORE BREAKFAST 90 tablet 3   LORazepam (ATIVAN) 0.5 MG tablet Take 0.5-1 tablets (0.25-0.5 mg total) by mouth 2 (two) times daily as needed for anxiety. 30 tablet 0   metFORMIN (GLUCOPHAGE) 1000 MG tablet TAKE 1 TABLET BY MOUTH TWICE (2) DAILY WITH A MEAL 180 tablet 3   nitroGLYCERIN (NITROSTAT) 0.4 MG SL tablet Place 1 tablet (0.4 mg total) under the tongue every 5 (five) minutes as needed for chest pain. 25 tablet 3   potassium chloride SA (KLOR-CON M) 20 MEQ tablet Take 1 tablet (20 mEq total) by mouth 2 (two) times daily as needed (Take with lasix). 90 tablet 3   predniSONE (DELTASONE) 5 MG tablet TAKE TWO TO FOUR TABLETS BY MOUTH DAILY WITH BREAKFAST 120 tablet 2   tamsulosin (FLOMAX) 0.4 MG CAPS capsule TAKE ONE CAPSULE BY MOUTH ONCE DAILY 90 capsule 3   triamcinolone cream (KENALOG) 0.1 % Apply 1 application topically 2 (two) times daily as needed. (Patient taking differently: Apply 1 application  topically 2 (two) times daily as needed (irritation).) 30 g  0   Zinc 30 MG TABS Take by mouth daily.     No current facility-administered medications on file prior to visit.    Allergies  Allergen Reactions   Doxazosin Mesylate Other (See Comments)    REACTION: didn't work and may have caused SOB   Tetracyclines & Related    Carvedilol Other (See Comments)    Felt bad   Crestor [Rosuvastatin] Other (See Comments)    Muscle aches.   Doxycycline Other (See Comments) and Rash    REACTION: unspecified   Glimepiride Other (See Comments)    REACTION: red eyes   Glipizide Other (See Comments)    REACTION: myalgia??   Losartan Potassium-Hctz Other (See Comments)    REACTION: chest \\T \ leg pain   Metoprolol Tartrate Other (See Comments)    Mood swings, memory changes    Pravastatin Other (See Comments)    Muscle aches    Rosiglitazone Maleate Other (See Comments)    REACTION: myalgia   Simvastatin Other (See Comments)    REACTION: myalgias   Tetracycline Rash    Past Medical History:  Diagnosis Date   Angina    Arthritis    BPH (benign prostatic hypertrophy)    Carpal tunnel syndrome    Coronary artery disease    a. 11/2011 s/p CABG x 3  (LIMA-LAD, SeqSVG-D1-D3); b. 09/2019 CATH-RCA PCI: LM 50ost, LAD 100ost/p, Sev diff diag dzs, LCX nl, OM1 5m, OM3 40, RCA 70ost (iFR 0.88--> 3.0x26 Resolute Onyx DES), 9m, LIMA->LAD ok. VG->D1->D3 ok. EF 50-55%.; c) 10/01/2019: staged PCI Ost LM (Resolute Onyx 3.5 x 12 - 4.2 mm) & Ost OM1 (Resolute Onyx 2.25 x 26 -- 2.5 mm)   GERD (gastroesophageal reflux disease)    Hx of colonic polyps    Hyperlipidemia    Hypertension    Kidney stones    Morbid obesity (HCC)    OSA (obstructive sleep apnea)    uses cpap   Type II diabetes mellitus (HCC)     Past Surgical History:  Procedure Laterality Date   CARDIAC CATHETERIZATION     CORONARY ARTERY BYPASS GRAFT  11/12/2011   Procedure: CORONARY ARTERY BYPASS GRAFTING (CABG);  Surgeon: Delight Ovens, MD;  Location: [facility] OR;  Service: Open Heart Surgery;  Laterality: N/A;  Coronary Artery Bypass Graft times three on pump utilizing left internal mammary artery and right saphenous vein harvested endoscopically    CORONARY PRESSURE/FFR STUDY N/A 09/27/2019   Procedure: INTRAVASCULAR PRESSURE WIRE/FFR STUDY;  Surgeon: Iran Ouch, MD;  Location: [facility] INVASIVE CV LAB;  Service: Cardiovascular;  Laterality: N/A;   CORONARY STENT INTERVENTION N/A 09/27/2019   Procedure: CORONARY STENT INTERVENTION;  Surgeon: Iran Ouch, MD;  Location: [facility] INV[facility] CV [facility];  Service: Cardiovascular;  Laterality: N/A;   CORONARY STENT INTERVENTION N/A 10/01/2019   Procedure: CORONARY STENT INTERVENTION;  Surgeon: Yvonne Kendall, MD;  Location: [facility];  Service:  Cardiovascular;  Laterality: N/A;   CORONARY ULTRASOUND/IVUS N/A 10/01/2019   Procedure: Intravascular Ultrasound/IVUS;  Surgeon: Yvonne Kendall, MD;  Location: [facility];  Service: Cardiovascular;  Laterality: N/A;   HERNIA REPAIR     KNEE ARTHROPLASTY Right 11/23/2017   Procedure: COMPUTER ASSISTED TOTAL KNEE ARTHROPLASTY;  Surgeon: Donato Heinz, MD;  Location: [facility] ORS;  Service: Orthopedics;  Laterality: Right;   KNEE SURGERY     left   LEFT HEART CATH AND CORONARY ANGIOGRAPHY Left 09/27/2019   Procedure: LEFT HEART CATH AND CORONARY ANGIOGRAPHY;  Surgeon: Mariah Milling,  Tollie Pizza, MD;  Location: [facility];  Service: Cardiovascular;  Laterality: Left;   LEFT HEART CATH AND CORONARY ANGIOGRAPHY N/A 10/01/2019   Procedure: LEFT HEART CATH AND CORONARY ANGIOGRAPHY;  Surgeon: Yvonne Kendall, MD;  Location: [facility];  Service: Cardiovascular;  Laterality: N/A;   MENISCUS REPAIR  03/08   right knee    Family History  Problem Relation Age of Onset   Coronary artery disease Father    Prostate cancer Father    Diabetes Neg Hx    Hypertension Neg Hx     Social History   Socioeconomic History   Marital status: Married    Spouse name: Not on file   Number of children: 3   Years of education: Not on file   Highest education level: Associate degree: occupational, Scientist, product/process development, or vocational program  Occupational History   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   Occupation: Biochemist, clinical &/or corn farming  Tobacco Use   Smoking status: Never    Passive exposure: Never   Smokeless tobacco: Never  Vaping Use   Vaping status: Never Used  Substance and Sexual Activity   Alcohol use: No   Drug use: No   Sexual activity: Yes  Other Topics Concern   Not on file  Social History Narrative   No living will   Requests wife as health care POA--no clear alternate   Would accept resuscitation   Would accept tube feedings--not sure about duration   Social  Determinants of Health   Financial Resource Strain: Low Risk  (07/19/2023)   Overall Financial Resource Strain (CARDIA)    Difficulty of Paying Living Expenses: Not hard at all  Food Insecurity: No Food Insecurity (07/19/2023)   Hunger Vital Sign    Worried About Running Out of Food in the Last Year: Never true    Ran Out of Food in the Last Year: Never true  Transportation Needs: No Transportation Needs (07/19/2023)   PRAPARE - Administrator, Civil Service (Medical): No    Lack of Transportation (Non-Medical): No  Physical Activity: Sufficiently Active (07/19/2023)   Exercise Vital Sign    Days of Exercise per Week: 5 days    Minutes of Exercise per Session: 30 min  Stress: Stress Concern Present (07/19/2023)   [employer] of Occupational Health - Occupational Stress Questionnaire    Feeling of Stress : To some extent  Social Connections: Unknown (07/19/2023)   Social Connection and Isolation Panel [NHANES]    Frequency of Communication with Friends and Family: Once a week    Frequency of Social Gatherings with Friends and Family: Once a week    Attends Religious Services: Patient declined    Database administrator or Organizations: No    Attends Engineer, structural: Not on file    Marital Status: Married  Catering manager Violence: Not on file   Review of Systems Trying to eat more healthy--and limit sweets Weight is stable Going to Dr Rennis Chris for left shoulder injection---has helped in the past    Objective:   Physical Exam Constitutional:      Appearance: Normal appearance.  Musculoskeletal:     Comments: Thickening in hand PIP and DIP but no clear synovitis  Skin:    Comments: Diffuse mild redness in both axillae No vesicles or other lesions now  Neurological:     Mental Status: He is alert.            Assessment &  Plan:

## 2023-07-20 NOTE — Assessment & Plan Note (Signed)
PDMP reviewed No concerns 

## 2023-07-20 NOTE — Assessment & Plan Note (Signed)
Affecting his sleep Requests cortisone injection--discussed (he gave verbal consent)  Sterile prep posterior left shoulder Ethyl chloride then 2cc 1% lidocaine 40mg  trimacinolone/5cc 1% lidocaine then instilled in joint without difficulty Tolerated well Discussed home care

## 2023-07-20 NOTE — Assessment & Plan Note (Signed)
Does fine with the hydrocodone 1-2 per day

## 2023-07-20 NOTE — Assessment & Plan Note (Signed)
Lab Results  Component Value Date   HGBA1C 8.6 (A) 07/20/2023   Much better--even though not able to be consistent with the glipizide Unclear if the rash was from this--will rechallenge with the 5mg --and he will let me know if recurrent issue (consider glimepiride??) Still on trulicity 3mg  weekly, invokana 300mg  daily, metformin 1000 bid

## 2023-07-20 NOTE — Assessment & Plan Note (Signed)
Able to cut prednisone down to 5 mg bid---this may be part of why his sugars are better

## 2023-07-20 NOTE — Addendum Note (Signed)
Addended by: Eual Fines on: 07/20/2023 02:03 PM   Modules accepted: Orders

## 2023-07-22 ENCOUNTER — Other Ambulatory Visit: Payer: Self-pay | Admitting: Internal Medicine

## 2023-07-22 NOTE — Telephone Encounter (Signed)
Name of Medication: Hydrocodone Name of Pharmacy: [facility] or Written Date and Quantity: 05-31-23 #60 Last Office Visit and Type: 07-20-23 Next Office Visit and Type: 10-20-23 Last Controlled Substance Agreement Date: 10-18-22 Last UDS: 10-18-22

## 2023-08-15 ENCOUNTER — Encounter: Payer: Self-pay | Admitting: Internal Medicine

## 2023-08-15 NOTE — Telephone Encounter (Signed)
Called left message and sent my chart to set up visit. Hold to make sure we get patient.

## 2023-08-15 NOTE — Telephone Encounter (Signed)
Patient has set up appointment for 9/10

## 2023-08-16 ENCOUNTER — Ambulatory Visit (INDEPENDENT_AMBULATORY_CARE_PROVIDER_SITE_OTHER): Payer: Medicare HMO | Admitting: Internal Medicine

## 2023-08-16 ENCOUNTER — Encounter: Payer: Self-pay | Admitting: Internal Medicine

## 2023-08-16 VITALS — BP 132/74 | HR 77 | Temp 97.6°F | Ht 65.0 in | Wt 181.0 lb

## 2023-08-16 DIAGNOSIS — L03115 Cellulitis of right lower limb: Secondary | ICD-10-CM

## 2023-08-16 MED ORDER — AMOXICILLIN-POT CLAVULANATE 875-125 MG PO TABS: 1.0000 | ORAL_TABLET | Freq: Two times a day (BID) | ORAL | 0 refills | Status: DC

## 2023-08-16 MED ORDER — TRIAMCINOLONE ACETONIDE 0.1 % EX CREA: 1.0000 | TOPICAL_CREAM | Freq: Two times a day (BID) | CUTANEOUS | 0 refills | Status: AC | PRN

## 2023-08-16 NOTE — Assessment & Plan Note (Signed)
Not at tick bite site No chronic edema of note Some small ecchymotic areas--may have recurrent mild trauma and skin breaks Will finish out treatment with augmentin 875 bid x 5 days

## 2023-08-16 NOTE — Progress Notes (Signed)
Subjective:    Patient ID: [name] [name], male    DOB: 06-07-1951, 72 y.o.   MRN: [id number]  HPI Here with concern about possible infection on leg  Thinks he has recurrence of cellulitis Was seen in urgent care about 2 months ago--and they did Lyme tests but never got results  Now he found 2 ticks on his legs about 10 days ago (one on each leg) Had fever 3 days ago--and noticed some changes in leg Took left over antibiotic (amoxil)--seemed to help (chills went away)  Current Outpatient Medications on File Prior to Visit  Medication Sig Dispense Refill   Accu-Chek FastClix Lancets MISC Use to obtain blood sugar sample once daily. Dx code E11.8 102 each 4   albuterol (VENTOLIN HFA) 108 (90 Base) MCG/ACT inhaler Inhale 2 puffs into the lungs every 6 (six) hours as needed for wheezing or shortness of breath. 90 g 0   Ascorbic Acid (VITAMIN C) 1000 MG tablet Take 1,000 mg by mouth daily.     aspirin EC 81 MG tablet Take 1 tablet (81 mg total) by mouth daily. 90 tablet 3   clopidogrel (PLAVIX) 75 MG tablet Take 1 tablet (75 mg total) by mouth daily with breakfast. Please call [phone] for an appointment for further refills. 90 tablet 3   Dulaglutide (TRULICITY) 3 MG/0.5ML SOPN Inject 3 mg as directed once a week. 2 mL 11   Evolocumab (REPATHA SURECLICK) 140 MG/ML SOAJ Inject 140 mg into the skin every 14 (fourteen) days. 6 mL 3   furosemide (LASIX) 40 MG tablet Take 1 tablet (40 mg total) by mouth daily as needed. 90 tablet 3   glipiZIDE (GLUCOTROL XL) 5 MG 24 hr tablet Take 1 tablet (5 mg total) by mouth daily with breakfast. 30 tablet 5   glucose blood (ACCU-CHEK GUIDE) test strip Use to check blood sugar once daily Dx code E11.8 100 each 4   HYDROcodone-acetaminophen (NORCO/VICODIN) 5-325 MG tablet TAKE ONE TABLET BY MOUTH TWICE A DAY 60 tablet 0   INVOKANA 300 MG TABS tablet TAKE ONE TABLET BY MOUTH ONCE A DAY BEFORE BREAKFAST 90 tablet 3   LORazepam (ATIVAN) 0.5 MG tablet  Take 0.5-1 tablets (0.25-0.5 mg total) by mouth 2 (two) times daily as needed for anxiety. 30 tablet 0   metFORMIN (GLUCOPHAGE) 1000 MG tablet TAKE 1 TABLET BY MOUTH TWICE (2) DAILY WITH A MEAL 180 tablet 3   nitroGLYCERIN (NITROSTAT) 0.4 MG SL tablet Place 1 tablet (0.4 mg total) under the tongue every 5 (five) minutes as needed for chest pain. 25 tablet 3   potassium chloride SA (KLOR-CON M) 20 MEQ tablet Take 1 tablet (20 mEq total) by mouth 2 (two) times daily as needed (Take with lasix). 90 tablet 3   predniSONE (DELTASONE) 5 MG tablet Take 1 tablet (5 mg total) by mouth 2 (two) times daily with a meal. 1 tablet 2   tamsulosin (FLOMAX) 0.4 MG CAPS capsule TAKE ONE CAPSULE BY MOUTH ONCE DAILY 90 capsule 3   triamcinolone cream (KENALOG) 0.1 % Apply 1 application topically 2 (two) times daily as needed. (Patient taking differently: Apply 1 application  topically 2 (two) times daily as needed (irritation).) 30 g 0   Zinc 30 MG TABS Take by mouth daily.     No current facility-administered medications on file prior to visit.    Allergies  Allergen Reactions   Doxazosin Mesylate Other (See Comments)    REACTION: didn't work and may have caused SOB  Tetracyclines & Related    Carvedilol Other (See Comments)    Felt bad   Crestor [Rosuvastatin] Other (See Comments)    Muscle aches.   Doxycycline Other (See Comments) and Rash    REACTION: unspecified   Glimepiride Other (See Comments)    REACTION: red eyes   Glipizide Other (See Comments)    REACTION: myalgia??   Losartan Potassium-Hctz Other (See Comments)    REACTION: chest \\T \ leg pain   Metoprolol Tartrate Other (See Comments)    Mood swings, memory changes   Pravastatin Other (See Comments)    Muscle aches    Rosiglitazone Maleate Other (See Comments)    REACTION: myalgia   Simvastatin Other (See Comments)    REACTION: myalgias   Tetracycline Rash    Past Medical History:  Diagnosis Date   Angina    Arthritis    BPH  (benign prostatic hypertrophy)    Carpal tunnel syndrome    Coronary artery disease    a. 11/2011 s/p CABG x 3  (LIMA-LAD, SeqSVG-D1-D3); b. 09/2019 CATH-RCA PCI: LM 50ost, LAD 100ost/p, Sev diff diag dzs, LCX nl, OM1 22m, OM3 40, RCA 70ost (iFR 0.88--> 3.0x26 Resolute Onyx DES), 15m, LIMA->LAD ok. VG->D1->D3 ok. EF 50-55%.; c) 10/01/2019: staged PCI Ost LM (Resolute Onyx 3.5 x 12 - 4.2 mm) & Ost OM1 (Resolute Onyx 2.25 x 26 -- 2.5 mm)   GERD (gastroesophageal reflux disease)    Hx of colonic polyps    Hyperlipidemia    Hypertension    Kidney stones    Morbid obesity (HCC)    OSA (obstructive sleep apnea)    uses cpap   Type II diabetes mellitus (HCC)     Past Surgical History:  Procedure Laterality Date   CARDIAC CATHETERIZATION     CORONARY ARTERY BYPASS GRAFT  11/12/2011   Procedure: CORONARY ARTERY BYPASS GRAFTING (CABG);  Surgeon: Delight Ovens, MD;  Location: [facility] OR;  Service: Open Heart Surgery;  Laterality: N/A;  Coronary Artery Bypass Graft times three on pump utilizing left internal mammary artery and right saphenous vein harvested endoscopically    CORONARY PRESSURE/FFR STUDY N/A 09/27/2019   Procedure: INTRAVASCULAR PRESSURE WIRE/FFR STUDY;  Surgeon: Iran Ouch, MD;  Location: [facility];  Service: Cardiovascular;  Laterality: N/A;   CORONARY STENT INTERVENTION N/A 09/27/2019   Procedure: CORONARY STENT INTERVENTION;  Surgeon: Iran Ouch, MD;  Location: [facility];  Service: Cardiovascular;  Laterality: N/A;   CORONARY STENT INTERVENTION N/A 10/01/2019   Procedure: CORONARY STENT INTERVENTION;  Surgeon: Yvonne Kendall, MD;  Location: [facility];  Service: Cardiovascular;  Laterality: N/A;   CORONARY ULTRASOUND/IVUS N/A 10/01/2019   Procedure: Intravascular Ultrasound/IVUS;  Surgeon: Yvonne Kendall, MD;  Location: [facility];  Service: Cardiovascular;  Laterality: N/A;   HERNIA REPAIR     KNEE ARTHROPLASTY Right  11/23/2017   Procedure: COMPUTER ASSISTED TOTAL KNEE ARTHROPLASTY;  Surgeon: Donato Heinz, MD;  Location: [facility] ORS;  Service: Orthopedics;  Laterality: Right;   KNEE SURGERY     left   LEFT HEART CATH AND CORONARY ANGIOGRAPHY Left 09/27/2019   Procedure: LEFT HEART CATH AND CORONARY ANGIOGRAPHY;  Surgeon: Antonieta Iba, MD;  Location: [facility];  Service: Cardiovascular;  Laterality: Left;   LEFT HEART CATH AND CORONARY ANGIOGRAPHY N/A 10/01/2019   Procedure: LEFT HEART CATH AND CORONARY ANGIOGRAPHY;  Surgeon: Yvonne Kendall, MD;  Location: [facility];  Service: Cardiovascular;  Laterality: N/A;  MENISCUS REPAIR  03/08   right knee    Family History  Problem Relation Age of Onset   Coronary artery disease Father    Prostate cancer Father    Diabetes Neg Hx    Hypertension Neg Hx     Social History   Socioeconomic History   Marital status: Married    Spouse name: Not on file   Number of children: 3   Years of education: Not on file   Highest education level: Associate degree: occupational, Scientist, product/process development, or vocational program  Occupational History   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   Occupation: Biochemist, clinical &/or corn farming  Tobacco Use   Smoking status: Never    Passive exposure: Never   Smokeless tobacco: Never  Vaping Use   Vaping status: Never Used  Substance and Sexual Activity   Alcohol use: No   Drug use: No   Sexual activity: Yes  Other Topics Concern   Not on file  Social History Narrative   No living will   Requests wife as health care POA--no clear alternate   Would accept resuscitation   Would accept tube feedings--not sure about duration   Social Determinants of Health   Financial Resource Strain: Low Risk  (07/19/2023)   Overall Financial Resource Strain (CARDIA)    Difficulty of Paying Living Expenses: Not hard at all  Food Insecurity: No Food Insecurity (07/19/2023)   Hunger Vital Sign    Worried About Running  Out of Food in the Last Year: Never true    Ran Out of Food in the Last Year: Never true  Transportation Needs: No Transportation Needs (07/19/2023)   PRAPARE - Administrator, Civil Service (Medical): No    Lack of Transportation (Non-Medical): No  Physical Activity: Sufficiently Active (07/19/2023)   Exercise Vital Sign    Days of Exercise per Week: 5 days    Minutes of Exercise per Session: 30 min  Stress: Stress Concern Present (07/19/2023)   [employer] of Occupational Health - Occupational Stress Questionnaire    Feeling of Stress : To some extent  Social Connections: Unknown (07/19/2023)   Social Connection and Isolation Panel [NHANES]    Frequency of Communication with Friends and Family: Once a week    Frequency of Social Gatherings with Friends and Family: Once a week    Attends Religious Services: Patient declined    Database administrator or Organizations: No    Attends Engineer, structural: Not on file    Marital Status: Married  Catering manager Violence: Not on file   Review of Systems No change in urination No SOB---slight cough yesterday Had worsening trouble getting in truck--like past PMR symptoms--but took extra prednisone for 1 day. Not really back to normal     Objective:   Physical Exam Skin:    Comments: Scattered mild redness in anterior right calf--and warmth. He notes improvement with the partial treatment--not at tick bite site            Assessment & Plan:

## 2023-09-02 ENCOUNTER — Ambulatory Visit: Payer: Medicare HMO | Admitting: Physician Assistant

## 2023-09-12 ENCOUNTER — Other Ambulatory Visit: Payer: Self-pay | Admitting: Internal Medicine

## 2023-09-12 NOTE — Telephone Encounter (Signed)
Name of Medication: Hydrocodone Name of Pharmacy: [facility] or Written Date and Quantity: 07-23-23 #60 Last Office Visit and Type: 08-16-23 Next Office Visit and Type: 10-20-23 Last Controlled Substance Agreement Date: 10-18-22 Last UDS: 10-18-22

## 2023-10-10 DIAGNOSIS — H25813 Combined forms of age-related cataract, bilateral: Secondary | ICD-10-CM | POA: Diagnosis not present

## 2023-10-10 DIAGNOSIS — H40053 Ocular hypertension, bilateral: Secondary | ICD-10-CM | POA: Diagnosis not present

## 2023-10-10 DIAGNOSIS — H52223 Regular astigmatism, bilateral: Secondary | ICD-10-CM | POA: Diagnosis not present

## 2023-10-10 DIAGNOSIS — E113212 Type 2 diabetes mellitus with mild nonproliferative diabetic retinopathy with macular edema, left eye: Secondary | ICD-10-CM | POA: Diagnosis not present

## 2023-10-10 DIAGNOSIS — E113291 Type 2 diabetes mellitus with mild nonproliferative diabetic retinopathy without macular edema, right eye: Secondary | ICD-10-CM | POA: Diagnosis not present

## 2023-10-10 DIAGNOSIS — H524 Presbyopia: Secondary | ICD-10-CM | POA: Diagnosis not present

## 2023-10-10 DIAGNOSIS — H10413 Chronic giant papillary conjunctivitis, bilateral: Secondary | ICD-10-CM | POA: Diagnosis not present

## 2023-10-10 LAB — HM DIABETES EYE EXAM

## 2023-10-11 ENCOUNTER — Other Ambulatory Visit: Payer: Self-pay | Admitting: Internal Medicine

## 2023-10-20 ENCOUNTER — Ambulatory Visit: Payer: Medicare HMO | Admitting: Internal Medicine

## 2023-10-20 VITALS — BP 114/78 | HR 77 | Temp 97.9°F | Ht 65.0 in | Wt 185.0 lb

## 2023-10-20 DIAGNOSIS — I25118 Atherosclerotic heart disease of native coronary artery with other forms of angina pectoris: Secondary | ICD-10-CM

## 2023-10-20 DIAGNOSIS — E119 Type 2 diabetes mellitus without complications: Secondary | ICD-10-CM | POA: Insufficient documentation

## 2023-10-20 DIAGNOSIS — F1120 Opioid dependence, uncomplicated: Secondary | ICD-10-CM

## 2023-10-20 DIAGNOSIS — G894 Chronic pain syndrome: Secondary | ICD-10-CM | POA: Diagnosis not present

## 2023-10-20 DIAGNOSIS — Z7985 Long-term (current) use of injectable non-insulin antidiabetic drugs: Secondary | ICD-10-CM | POA: Diagnosis not present

## 2023-10-20 DIAGNOSIS — E1159 Type 2 diabetes mellitus with other circulatory complications: Secondary | ICD-10-CM | POA: Diagnosis not present

## 2023-10-20 DIAGNOSIS — Z7984 Long term (current) use of oral hypoglycemic drugs: Secondary | ICD-10-CM | POA: Diagnosis not present

## 2023-10-20 LAB — POCT GLYCOSYLATED HEMOGLOBIN (HGB A1C): Hemoglobin A1C: 7.7 % — AB (ref 4.0–5.6)

## 2023-10-20 MED ORDER — HYDROCODONE-ACETAMINOPHEN 5-325 MG PO TABS: ORAL_TABLET | ORAL | 0 refills | Status: DC

## 2023-10-20 NOTE — Assessment & Plan Note (Signed)
PDMP reivewed  No concerns

## 2023-10-20 NOTE — Assessment & Plan Note (Signed)
Mostly his back Does well with just daily hydrocodone

## 2023-10-20 NOTE — Assessment & Plan Note (Signed)
Stays quite active with good exercise tolerance Rare chest symptoms--??stable angina Is on asa, plavix, repatha

## 2023-10-20 NOTE — Progress Notes (Signed)
Subjective:    Patient ID: [name] [name], male    DOB: Sep 08, 1951, 72 y.o.   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 Dx code E11.8 102 each 4   albuterol (VENTOLIN HFA) 108 (90 Base) MCG/ACT inhaler Inhale 2 puffs into the lungs every 6 (six) hours as needed for wheezing or shortness of breath. 90 g 0   Ascorbic Acid (VITAMIN C) 1000 MG tablet Take 1,000 mg by mouth daily.     aspirin EC 81 MG tablet Take 1 tablet (81 mg total) by mouth daily. 90 tablet 3   clopidogrel (PLAVIX) 75 MG tablet Take 1 tablet (75 mg total) by mouth daily with breakfast. Please call [phone] for an appointment for further refills. 90 tablet 3   Dulaglutide (TRULICITY) 3 MG/0.5ML SOPN Inject 3 mg as directed once a week. 2 mL 11   Evolocumab (REPATHA SURECLICK) 140 MG/ML SOAJ Inject 140 mg into the skin every 14 (fourteen) days. 6 mL 3   furosemide (LASIX) 40 MG tablet Take 1 tablet (40 mg total) by mouth daily as needed. 90 tablet 3   glipiZIDE (GLUCOTROL XL) 5 MG 24 hr tablet TAKE ONE TABLET (5 MG TOTAL) BY MOUTH DAILY WITH BREAKFAST. 30 tablet 11   glucose blood (ACCU-CHEK GUIDE) test strip Use to check blood sugar once daily Dx code E11.8 100 each 4   HYDROcodone-acetaminophen (NORCO/VICODIN) 5-325 MG tablet TAKE ONE TABLET BY MOUTH TWICE A DAY 60 tablet 0   INVOKANA 300 MG TABS tablet TAKE ONE TABLET BY MOUTH ONCE A DAY BEFORE  BREAKFAST 90 tablet 3   LORazepam (ATIVAN) 0.5 MG tablet Take 0.5-1 tablets (0.25-0.5 mg total) by mouth 2 (two) times daily as needed for anxiety. 30 tablet 0   metFORMIN (GLUCOPHAGE) 1000 MG tablet TAKE 1 TABLET BY MOUTH TWICE (2) DAILY WITH A MEAL 180 tablet 3   nitroGLYCERIN (NITROSTAT) 0.4 MG SL tablet Place 1 tablet (0.4 mg total) under the tongue every 5 (five) minutes as needed for chest pain. 25 tablet 3   potassium chloride SA (KLOR-CON M) 20 MEQ tablet Take 1 tablet (20 mEq total) by mouth 2 (two) times daily as needed (Take with lasix). 90 tablet 3   predniSONE (DELTASONE) 5 MG tablet TAKE TWO TO FOUR TABLETS BY MOUTH DAILY WITH BREAKFAST 120 tablet 2   tamsulosin (FLOMAX) 0.4 MG CAPS capsule TAKE ONE CAPSULE BY MOUTH ONCE DAILY 90 capsule 3   triamcinolone cream (KENALOG) 0.1 % Apply 1 Application topically 2 (two) times daily as needed. 30 g 0   Zinc 30 MG TABS Take by mouth daily.     No current facility-administered medications on file prior to visit.    Allergies  Allergen Reactions   Doxazosin Mesylate Other (See Comments)    REACTION: didn't work and may have caused SOB   Tetracyclines & Related    Carvedilol Other (See Comments)    Felt bad  Crestor [Rosuvastatin] Other (See Comments)    Muscle aches.   Doxycycline Other (See Comments) and Rash    REACTION: unspecified   Glimepiride Other (See Comments)    REACTION: red eyes   Glipizide Other (See Comments)    REACTION: myalgia??   Losartan Potassium-Hctz Other (See Comments)    REACTION: chest \\T \ leg pain   Metoprolol Tartrate Other (See Comments)    Mood swings, memory changes   Pravastatin Other (See Comments)    Muscle aches    Rosiglitazone Maleate Other (See Comments)    REACTION: myalgia   Simvastatin Other (See Comments)    REACTION: myalgias   Tetracycline Rash    Past Medical History:  Diagnosis Date   Angina    Arthritis    BPH (benign prostatic hypertrophy)    Carpal tunnel syndrome     Coronary artery disease    a. 11/2011 s/p CABG x 3  (LIMA-LAD, SeqSVG-D1-D3); b. 09/2019 CATH-RCA PCI: LM 50ost, LAD 100ost/p, Sev diff diag dzs, LCX nl, OM1 51m, OM3 40, RCA 70ost (iFR 0.88--> 3.0x26 Resolute Onyx DES), 28m, LIMA->LAD ok. VG->D1->D3 ok. EF 50-55%.; c) 10/01/2019: staged PCI Ost LM (Resolute Onyx 3.5 x 12 - 4.2 mm) & Ost OM1 (Resolute Onyx 2.25 x 26 -- 2.5 mm)   GERD (gastroesophageal reflux disease)    Hx of colonic polyps    Hyperlipidemia    Hypertension    Kidney stones    Morbid obesity (HCC)    OSA (obstructive sleep apnea)    uses cpap   Type II diabetes mellitus (HCC)     Past Surgical History:  Procedure Laterality Date   CARDIAC CATHETERIZATION     CORONARY ARTERY BYPASS GRAFT  11/12/2011   Procedure: CORONARY ARTERY BYPASS GRAFTING (CABG);  Surgeon: Delight Ovens, MD;  Location: [facility];  Service: Open Heart Surgery;  Laterality: N/A;  Coronary Artery Bypass Graft times three on pump utilizing left internal mammary artery and right saphenous vein harvested endoscopically    CORONARY PRESSURE/FFR STUDY N/A 09/27/2019   Procedure: INTRAVASCULAR PRESSURE WIRE/FFR STUDY;  Surgeon: Iran Ouch, MD;  Location: [facility] INVASIVE CV LAB;  Service: Cardiovascular;  Laterality: N/A;   CORONARY STENT INTERVENTION N/A 09/27/2019   Procedure: CORONARY STENT INTERVENTION;  Surgeon: Iran Ouch, MD;  Location: [facility] INVASIVE CV LAB;  Service: Cardiovascular;  Laterality: N/A;   CORONARY STENT INTERVENTION N/A 10/01/2019   Procedure: CORONARY STENT INTERVENTION;  Surgeon: Yvonne Kendall, MD;  Location: [facility] INVASIVE CV LAB;  Service: Cardiovascular;  Laterality: N/A;   CORONARY ULTRASOUND/IVUS N/A 10/01/2019   Procedure: Intravascular Ultrasound/IVUS;  Surgeon: Yvonne Kendall, MD;  Location: [facility] LAB;  Service: Cardiovascular;  Laterality: N/A;   HERNIA REPAIR     KNEE ARTHROPLASTY Right 11/23/2017   Procedure: COMPUTER ASSISTED TOTAL KNEE ARTHROPLASTY;   Surgeon: Donato Heinz, MD;  Location: [facility] ORS;  Service: Orthopedics;  Laterality: Right;   KNEE SURGERY     left   LEFT HEART CATH AND CORONARY ANGIOGRAPHY Left 09/27/2019   Procedure: LEFT HEART CATH AND CORONARY ANGIOGRAPHY;  Surgeon: Antonieta Iba, MD;  Location: [facility] INVASIVE CV LAB;  Service: Cardiovascular;  Laterality: Left;   LEFT HEART CATH AND CORONARY ANGIOGRAPHY N/A 10/01/2019   Procedure: LEFT HEART CATH AND CORONARY ANGIOGRAPHY;  Surgeon: Yvonne Kendall, MD;  Location: [facility];  Service: Cardiovascular;  Laterality: N/A;   MENISCUS REPAIR  03/08   right knee    Family History  Problem  Relation Age of Onset   Coronary artery disease Father    Prostate cancer Father    Diabetes Neg Hx    Hypertension Neg Hx     Social History   Socioeconomic History   Marital status: Married    Spouse name: Not on file   Number of children: 3   Years of education: Not on file   Highest education level: Associate degree: academic program  Occupational History   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   Occupation: Biochemist, clinical &/or corn farming  Tobacco Use   Smoking status: Never    Passive exposure: Never   Smokeless tobacco: Never  Vaping Use   Vaping status: Never Used  Substance and Sexual Activity   Alcohol use: No   Drug use: No   Sexual activity: Yes  Other Topics Concern   Not on file  Social History Narrative   No living will   Requests wife as health care POA--no clear alternate   Would accept resuscitation   Would accept tube feedings--not sure about duration   Social Determinants of Health   Financial Resource Strain: Low Risk  (10/20/2023)   Overall Financial Resource Strain (CARDIA)    Difficulty of Paying Living Expenses: Not hard at all  Food Insecurity: No Food Insecurity (10/20/2023)   Hunger Vital Sign    Worried About Running Out of Food in the Last Year: Never true    Ran Out of Food in the Last Year: Never true   Transportation Needs: No Transportation Needs (10/20/2023)   PRAPARE - Administrator, Civil Service (Medical): No    Lack of Transportation (Non-Medical): No  Physical Activity: Sufficiently Active (10/20/2023)   Exercise Vital Sign    Days of Exercise per Week: 5 days    Minutes of Exercise per Session: 30 min  Stress: No Stress Concern Present (10/20/2023)   [employer] of Occupational Health - Occupational Stress Questionnaire    Feeling of Stress : Not at all  Social Connections: Unknown (10/20/2023)   Social Connection and Isolation Panel [NHANES]    Frequency of Communication with Friends and Family: Once a week    Frequency of Social Gatherings with Friends and Family: Once a week    Attends Religious Services: Patient declined    Database administrator or Organizations: No    Attends Engineer, structural: Not on file    Marital Status: Married  Catering manager Violence: Not on file   Review of Systems Generally sleeps okay Weight up slightly     Objective:   Physical Exam Constitutional:      Appearance: Normal appearance.  Cardiovascular:     Rate and Rhythm: Normal rate and regular rhythm.     Pulses: Normal pulses.     Heart sounds: No murmur heard.    No gallop.  Pulmonary:     Effort: Pulmonary effort is normal.     Breath sounds: Normal breath sounds. No wheezing or rales.  Musculoskeletal:     Cervical back: Neck supple.     Right lower leg: No edema.     Left lower leg: No edema.  Lymphadenopathy:     Cervical: No cervical adenopathy.  Neurological:     Mental Status: He is alert.  Psychiatric:        Mood and Affect: Mood normal.        Behavior: Behavior normal.  Assessment & Plan:

## 2023-10-20 NOTE — Assessment & Plan Note (Signed)
Lab Results  Component Value Date   HGBA1C 7.7 (A) 10/20/2023   Control is better Invokana 300mg  daily, trulicity 3mg  weekly, glipizide 5mg  daily and metformin 1000 bid

## 2023-11-11 ENCOUNTER — Ambulatory Visit: Payer: Medicare HMO | Admitting: Gastroenterology

## 2023-11-11 ENCOUNTER — Telehealth: Payer: Self-pay

## 2023-11-11 ENCOUNTER — Encounter: Payer: Self-pay | Admitting: Gastroenterology

## 2023-11-11 VITALS — BP 110/70 | HR 80 | Ht 65.0 in | Wt 185.0 lb

## 2023-11-11 DIAGNOSIS — Z7902 Long term (current) use of antithrombotics/antiplatelets: Secondary | ICD-10-CM

## 2023-11-11 DIAGNOSIS — R195 Other fecal abnormalities: Secondary | ICD-10-CM

## 2023-11-11 DIAGNOSIS — Z955 Presence of coronary angioplasty implant and graft: Secondary | ICD-10-CM

## 2023-11-11 DIAGNOSIS — I2510 Atherosclerotic heart disease of native coronary artery without angina pectoris: Secondary | ICD-10-CM

## 2023-11-11 DIAGNOSIS — Z860101 Personal history of adenomatous and serrated colon polyps: Secondary | ICD-10-CM

## 2023-11-11 DIAGNOSIS — Z860100 Personal history of colon polyps, unspecified: Secondary | ICD-10-CM

## 2023-11-11 DIAGNOSIS — I25118 Atherosclerotic heart disease of native coronary artery with other forms of angina pectoris: Secondary | ICD-10-CM

## 2023-11-11 MED ORDER — NA SULFATE-K SULFATE-MG SULF 17.5-3.13-1.6 GM/177ML PO SOLN: 1.0000 | Freq: Once | ORAL | 0 refills | Status: AC

## 2023-11-11 NOTE — Progress Notes (Unsigned)
Discussed the use of AI scribe software for clinical note transcription with the patient, who gave verbal consent to proceed.  HPI : KAYDEN GUSTAFSON is a 72 y.o. male with a history of coronary artery disease on aspirin and Plavix, diabetes and obstructive sleep apnea who is referred to [place] by Karie Schwalbe, MD for positive fecal occult blood test.  The patient had a positive fecal occult blood test in May of last year.  He has a history of severe coronary artery disease and underwent three-vessel CABG in 2012.  In 2020, he was having chest pain and was found to have recurrent CAD and had multiple stents placed and was recommended to remain on indefinite dual antiplatelet therapy.  His most recent echo in 2020 showed normal systolic function and mild diastolic dysfunction.  He had a colonoscopy in 2008 in which a small ascending colon tubular adenoma was removed.   The patient, with a history of coronary artery disease and recent stent placement, presents for evaluation of a positive fecal immunochemical test (FIT). He denies any overt gastrointestinal symptoms such as abdominal pain, nausea, vomiting, or diarrhea. He reports constipation, likely secondary to chronic hydrocodone use, with bowel movements every two to three days. He occasionally uses over-the-counter laxatives for relief. There is no family history of colon cancer.  Regarding his cardiac history, he underwent bypass surgery in 2012 and stent placement in 2020 due to recurrent chest pain. Since the stent placement, he reports occasional fleeting chest discomfort but denies any significant chest pain. He remains active, engaging in farming and woodcutting. He is on daily aspirin and Plavix therapy. He recalls possibly holding Plavix for the stent procedure. He denies any concerning cardiac symptoms such as lightheadedness, syncope, or dyspnea. He reports occasional lower extremity edema, which resolves with diuretic use. He  denies orthopnea.  The patient also has a history of cellulitis in the lower extremity, which has resolved with antibiotic therapy. He underwent a hernia repair in the past. He has a minimal smoking history.    Past Medical History:  Diagnosis Date   Angina    Arthritis    BPH (benign prostatic hypertrophy)    Carpal tunnel syndrome    Coronary artery disease    a. 11/2011 s/p CABG x 3  (LIMA-LAD, SeqSVG-D1-D3); b. 09/2019 CATH-RCA PCI: LM 50ost, LAD 100ost/p, Sev diff diag dzs, LCX nl, OM1 59m, OM3 40, RCA 70ost (iFR 0.88--> 3.0x26 Resolute Onyx DES), 21m, LIMA->LAD ok. VG->D1->D3 ok. EF 50-55%.; c) 10/01/2019: staged PCI Ost LM (Resolute Onyx 3.5 x 12 - 4.2 mm) & Ost OM1 (Resolute Onyx 2.25 x 26 -- 2.5 mm)   GERD (gastroesophageal reflux disease)    Hx of colonic polyps    Hyperlipidemia    Hypertension    Kidney stones    Morbid obesity (HCC)    OSA (obstructive sleep apnea)    uses cpap   Type II diabetes mellitus (HCC)      Past Surgical History:  Procedure Laterality Date   CARDIAC CATHETERIZATION     CORONARY ARTERY BYPASS GRAFT  11/12/2011   Procedure: CORONARY ARTERY BYPASS GRAFTING (CABG);  Surgeon: Delight Ovens, MD;  Location: [facility] OR;  Service: Open Heart Surgery;  Laterality: N/A;  Coronary Artery Bypass Graft times three on pump utilizing left internal mammary artery and right saphenous vein harvested endoscopically    CORONARY PRESSURE/FFR STUDY N/A 09/27/2019   Procedure: INTRAVASCULAR PRESSURE WIRE/FFR STUDY;  Surgeon: Iran Ouch, MD;  Location: [facility] INVASIVE CV LAB;  Service: Cardiovascular;  Laterality: N/A;   CORONARY STENT INTERVENTION N/A 09/27/2019   Procedure: CORONARY STENT INTERVENTION;  Surgeon: Iran Ouch, MD;  Location: [facility];  Service: Cardiovascular;  Laterality: N/A;   CORONARY STENT INTERVENTION N/A 10/01/2019   Procedure: CORONARY STENT INTERVENTION;  Surgeon: Yvonne Kendall, MD;  Location: [facility];   Service: Cardiovascular;  Laterality: N/A;   CORONARY ULTRASOUND/IVUS N/A 10/01/2019   Procedure: Intravascular Ultrasound/IVUS;  Surgeon: Yvonne Kendall, MD;  Location: [facility];  Service: Cardiovascular;  Laterality: N/A;   HERNIA REPAIR     KNEE ARTHROPLASTY Right 11/23/2017   Procedure: COMPUTER ASSISTED TOTAL KNEE ARTHROPLASTY;  Surgeon: Donato Heinz, MD;  Location: [facility] ORS;  Service: Orthopedics;  Laterality: Right;   KNEE SURGERY     left   LEFT HEART CATH AND CORONARY ANGIOGRAPHY Left 09/27/2019   Procedure: LEFT HEART CATH AND CORONARY ANGIOGRAPHY;  Surgeon: Antonieta Iba, MD;  Location: [facility];  Service: Cardiovascular;  Laterality: Left;   LEFT HEART CATH AND CORONARY ANGIOGRAPHY N/A 10/01/2019   Procedure: LEFT HEART CATH AND CORONARY ANGIOGRAPHY;  Surgeon: Yvonne Kendall, MD;  Location: [facility];  Service: Cardiovascular;  Laterality: N/A;   MENISCUS REPAIR  03/08   right knee   Family History  Problem Relation Age of Onset   Coronary artery disease Father    Prostate cancer Father    Diabetes Neg Hx    Hypertension Neg Hx    Social History   Tobacco Use   Smoking status: Never    Passive exposure: Never   Smokeless tobacco: Never  Vaping Use   Vaping status: Never Used  Substance Use Topics   Alcohol use: No   Drug use: No   Current Outpatient Medications  Medication Sig Dispense Refill   Accu-Chek FastClix Lancets MISC Use to obtain blood sugar sample once daily. Dx code E11.8 102 each 4   albuterol (VENTOLIN HFA) 108 (90 Base) MCG/ACT inhaler Inhale 2 puffs into the lungs every 6 (six) hours as needed for wheezing or shortness of breath. 90 g 0   Ascorbic Acid (VITAMIN C) 1000 MG tablet Take 1,000 mg by mouth daily.     aspirin EC 81 MG tablet Take 1 tablet (81 mg total) by mouth daily. 90 tablet 3   clopidogrel (PLAVIX) 75 MG tablet Take 1 tablet (75 mg total) by mouth daily with breakfast. Please call [phone]  for an appointment for further refills. 90 tablet 3   Dulaglutide (TRULICITY) 3 MG/0.5ML SOPN Inject 3 mg as directed once a week. 2 mL 11   Evolocumab (REPATHA SURECLICK) 140 MG/ML SOAJ Inject 140 mg into the skin every 14 (fourteen) days. 6 mL 3   furosemide (LASIX) 40 MG tablet Take 1 tablet (40 mg total) by mouth daily as needed. 90 tablet 3   glipiZIDE (GLUCOTROL XL) 5 MG 24 hr tablet TAKE ONE TABLET (5 MG TOTAL) BY MOUTH DAILY WITH BREAKFAST. 30 tablet 11   glucose blood (ACCU-CHEK GUIDE) test strip Use to check blood sugar once daily Dx code E11.8 100 each 4   HYDROcodone-acetaminophen (NORCO/VICODIN) 5-325 MG tablet TAKE ONE TABLET BY MOUTH TWICE A DAY 60 tablet 0   INVOKANA 300 MG TABS tablet TAKE ONE TABLET BY MOUTH ONCE A DAY BEFORE BREAKFAST 90 tablet 3   LORazepam (ATIVAN) 0.5 MG tablet Take 0.5-1 tablets (0.25-0.5 mg total) by mouth 2 (two) times  daily as needed for anxiety. 30 tablet 0   metFORMIN (GLUCOPHAGE) 1000 MG tablet TAKE 1 TABLET BY MOUTH TWICE (2) DAILY WITH A MEAL 180 tablet 3   nitroGLYCERIN (NITROSTAT) 0.4 MG SL tablet Place 1 tablet (0.4 mg total) under the tongue every 5 (five) minutes as needed for chest pain. 25 tablet 3   potassium chloride SA (KLOR-CON M) 20 MEQ tablet Take 1 tablet (20 mEq total) by mouth 2 (two) times daily as needed (Take with lasix). 90 tablet 3   predniSONE (DELTASONE) 5 MG tablet TAKE TWO TO FOUR TABLETS BY MOUTH DAILY WITH BREAKFAST 120 tablet 2   tamsulosin (FLOMAX) 0.4 MG CAPS capsule TAKE ONE CAPSULE BY MOUTH ONCE DAILY 90 capsule 3   triamcinolone cream (KENALOG) 0.1 % Apply 1 Application topically 2 (two) times daily as needed. 30 g 0   Zinc 30 MG TABS Take by mouth daily.     No current facility-administered medications for this visit.   Allergies  Allergen Reactions   Doxazosin Mesylate Other (See Comments)    REACTION: didn't work and may have caused SOB   Tetracyclines & Related    Carvedilol Other (See Comments)    Felt bad    Crestor [Rosuvastatin] Other (See Comments)    Muscle aches.   Doxycycline Other (See Comments) and Rash    REACTION: unspecified   Glimepiride Other (See Comments)    REACTION: red eyes   Glipizide Other (See Comments)    REACTION: myalgia??   Losartan Potassium-Hctz Other (See Comments)    REACTION: chest \\T \ leg pain   Metoprolol Tartrate Other (See Comments)    Mood swings, memory changes   Pravastatin Other (See Comments)    Muscle aches    Rosiglitazone Maleate Other (See Comments)    REACTION: myalgia   Simvastatin Other (See Comments)    REACTION: myalgias   Tetracycline Rash     Review of Systems: All systems reviewed and negative except where noted in HPI.    No results found.  Physical Exam: BP 110/70   Pulse 80   Ht 5\' 5"  (1.651 m)   Wt 185 lb (83.9 kg)   BMI 30.79 kg/m  Constitutional: Pleasant,well-developed, Caucasian male in no acute distress. HEENT: Normocephalic and atraumatic. Conjunctivae are normal. No scleral icterus. Neck supple.  Cardiovascular: Normal rate, regular rhythm.  Pulmonary/chest: Effort normal and breath sounds normal. No wheezing, rales or rhonchi. Abdominal: Soft, nondistended, nontender. Bowel sounds active throughout. There are no masses palpable. No hepatomegaly. Extremities: no edema Lymphadenopathy: No cervical adenopathy noted. Neurological: Alert and oriented to person place and time. Skin: Skin is warm and dry. No rashes noted. Psychiatric: Normal mood and affect. Behavior is normal.  CBC    Component Value Date/Time   WBC 7.9 05/13/2023 1239   RBC 5.29 05/13/2023 1239   HGB 15.2 05/13/2023 1239   HGB 15.5 10/09/2019 1152   HCT 46.9 05/13/2023 1239   HCT 46.4 10/09/2019 1152   PLT 224.0 05/13/2023 1239   PLT 260 10/09/2019 1152   MCV 88.7 05/13/2023 1239   MCV 88 10/09/2019 1152   MCH 29.5 10/09/2019 1152   MCH 29.6 10/02/2019 0302   MCHC 32.5 05/13/2023 1239   RDW 15.4 05/13/2023 1239   RDW 13.8  10/09/2019 1152   LYMPHSABS 1.6 05/13/2023 1239   LYMPHSABS 1.2 11/05/2011 0901   MONOABS 0.6 05/13/2023 1239   EOSABS 0.0 05/13/2023 1239   EOSABS 0.3 11/05/2011 0901   BASOSABS 0.0 05/13/2023 1239  BASOSABS 0.0 11/05/2011 0901    CMP     Component Value Date/Time   NA 139 04/19/2023 1354   NA 137 10/09/2019 1152   K 3.9 04/19/2023 1354   CL 100 04/19/2023 1354   CO2 30 04/19/2023 1354   GLUCOSE 190 (H) 04/19/2023 1354   BUN 21 04/19/2023 1354   BUN 18 10/09/2019 1152   CREATININE 0.90 04/19/2023 1354   CALCIUM 9.2 04/19/2023 1354   PROT 6.3 04/19/2023 1354   PROT 6.7 05/13/2020 1206   ALBUMIN 4.0 04/19/2023 1354   ALBUMIN 4.5 05/13/2020 1206   AST 11 04/19/2023 1354   ALT 21 04/19/2023 1354   ALKPHOS 74 04/19/2023 1354   BILITOT 0.6 04/19/2023 1354   BILITOT 0.3 05/13/2020 1206   GFRNONAA 73 10/09/2019 1152   GFRAA 84 10/09/2019 1152       Latest Ref Rng & Units 05/13/2023   12:39 PM 04/19/2023    1:54 PM 03/30/2022   12:44 PM  CBC EXTENDED  WBC 4.0 - 10.5 K/uL 7.9  9.5  7.6   RBC 4.22 - 5.81 Mil/uL 5.29  5.51  5.38   Hemoglobin 13.0 - 17.0 g/dL 78.2  95.6  21.3   HCT 39.0 - 52.0 % 46.9  48.9  47.5   Platelets 150.0 - 400.0 K/uL 224.0  244.0  210.0   NEUT# 1.4 - 7.7 K/uL 5.6     Lymph# 0.7 - 4.0 K/uL 1.6         ASSESSMENT AND PLAN:  72 year old male with significant history of coronary artery disease status post CABG and subsequent stent placement, on aspirin and Plavix, with recent positive fit test.  No overt GI bleeding and no concerning GI symptoms.  Most recent echocardiogram with normal systolic function.  He denies any concerning cardiac symptoms.  Seen by cardiology earlier this year with no plans for further evaluation or medical change.  Patient is reasonable candidate for  colonoscopy in the [facility].  Positive Fecal Immunochemical Test (FIT) Positive FIT indicating blood in stool. No visible blood or concerning gastrointestinal symptoms. Constipation  likely due to hydrocodone use. No family history of colon cancer. Colonoscopy recommended to identify bleeding source. Risks include bleeding, especially with large polyp removal. Benefits include early detection and removal of precancerous polyps. Propofol for sedation. - Schedule colonoscopy - Hold Plavix for five days before procedure - Send letter to cardiologist (Dr. Lewie Loron) for clearance to hold Plavix - Continue daily aspirin  Coronary Artery Disease (CAD) Three-vessel CABG in 2012 and stent in 2020. Occasional mild chest discomfort, no significant cardiac symptoms. Active lifestyle. Occasional leg swelling managed with diuretics. Recent cardiology follow-up with no management changes. - Continue current medications including aspirin and Plavix (except for holding Plavix before colonoscopy) - Monitor for new or worsening cardiac symptoms - Follow up with cardiologist as needed  General Health Maintenance 72 years old, undergoing routine colon cancer screening. No significant smoking history. Recent cellulitis resolved with antibiotics. - Proceed with colonoscopy for colon cancer screening - Provide instructions for bowel preparation and liquid diet before procedure - Ensure understanding of sedation with propofol during procedure  Follow-up - Nurse to schedule colonoscopy and provide detailed instructions - Follow up with cardiologist for clearance to hold Plavix - Ensure understanding of procedure and preparation steps.        Karie Schwalbe, MD

## 2023-11-11 NOTE — Telephone Encounter (Signed)
   Name: [name] [name]  DOB: Nov 12, 1951  MRN: [id number]  Primary Cardiologist: [name], MD   Preoperative team, please contact this patient and set up a phone call appointment for further preoperative risk assessment. Please obtain consent and complete medication review. Thank you for your help.  I confirm that guidance regarding antiplatelet and oral anticoagulation therapy has been completed and, if necessary, noted below.  His Plavix may be held for 5 days prior to his procedure.  Please resume as soon as hemostasis is achieved  I also confirmed the patient resides in the state of [place]. As per [facility] Medical Board telemedicine laws, the patient must reside in the state in which the provider is licensed.   Ronney Asters, NP 11/11/2023, 2:12 PM [facility]

## 2023-11-11 NOTE — Telephone Encounter (Signed)
[facility] Pre-operative Risk Assessment     Request for surgical clearance:     Endoscopy Procedure  What type of surgery is being performed?   Colonoscopy  When is this surgery scheduled?     12/29/23  What type of clearance is required ?   Pharmacy  Are there any medications that need to be held prior to surgery and how long? Plavix 5 day6s   Practice name and name of physician performing surgery?      [facility]  What is your office phone and fax number?      Phone- [phone]  Fax- [phone]  Anesthesia type (None, local, MAC, general) ?       MAC   Please route your response to [facility]

## 2023-11-11 NOTE — Patient Instructions (Signed)
You have been scheduled for a colonoscopy. Please follow written instructions given to you at your visit today.   Please pick up your prep supplies at the pharmacy within the next 1-3 days.  If you use inhalers (even only as needed), please bring them with you on the day of your procedure.  DO NOT TAKE 7 DAYS PRIOR TO TEST- Trulicity (dulaglutide) Ozempic, Wegovy (semaglutide) Mounjaro (tirzepatide) Bydureon Bcise (exanatide extended release)  DO NOT TAKE 1 DAY PRIOR TO YOUR TEST Rybelsus (semaglutide) Adlyxin (lixisenatide) Victoza (liraglutide) Byetta (exanatide)  _______________________________________________________  If your blood pressure at your visit was 140/90 or greater, please contact your primary care physician to follow up on this.  _______________________________________________________  If you are age 18 or older, your body mass index should be between 23-30. Your Body mass index is 30.79 kg/m. If this is out of the aforementioned range listed, please consider follow up with your Primary Care Provider.  If you are age 18 or younger, your body mass index should be between 19-25. Your Body mass index is 30.79 kg/m. If this is out of the aformentioned range listed, please consider follow up with your Primary Care Provider.   ________________________________________________________  The [facility] providers would like to encourage you to use [facility] to communicate with providers for non-urgent requests or questions.  Due to long hold times on the telephone, sending your provider a message by [facility] may be a faster and more efficient way to get a response.  Please allow 48 business hours for a response.  Please remember that this is for non-urgent requests.   It was a pleasure to see you today!  Thank you for trusting me with your gastrointestinal care!    Scott E.Tomasa Rand, MD

## 2023-11-11 NOTE — Telephone Encounter (Signed)
1st attempt to reach pt regarding surgical clearance / need for a tele visit.  Left a message for pt to call back  and ask for the preop team.

## 2023-11-14 ENCOUNTER — Telehealth: Payer: Self-pay | Admitting: *Deleted

## 2023-11-14 NOTE — Telephone Encounter (Signed)
 Pt has been scheduled tele preop appt 12/12/23. Med rec and consent are done.

## 2023-11-14 NOTE — Telephone Encounter (Signed)
Pt has been scheduled tele pre op appt 12/12/23. Med rec and consent are done.     Patient Consent for Virtual Visit        JOSEPHUS CRUTHIRDS has provided verbal consent on 11/14/2023 for a virtual visit (video or telephone).   CONSENT FOR VIRTUAL VISIT FOR:  Letitia Neri  By participating in this virtual visit I agree to the following:  I hereby voluntarily request, consent and authorize [facility] and its employed or contracted physicians, physician assistants, nurse practitioners or other licensed health care professionals (the Practitioner), to provide me with telemedicine health care services (the "Services") as deemed necessary by the treating Practitioner. I acknowledge and consent to receive the Services by the Practitioner via telemedicine. I understand that the telemedicine visit will involve communicating with the Practitioner through live audiovisual communication technology and the disclosure of certain medical information by electronic transmission. I acknowledge that I have been given the opportunity to request an in-person assessment or other available alternative prior to the telemedicine visit and am voluntarily participating in the telemedicine visit.  I understand that I have the right to withhold or withdraw my consent to the use of telemedicine in the course of my care at any time, without affecting my right to future care or treatment, and that the Practitioner or I may terminate the telemedicine visit at any time. I understand that I have the right to inspect all information obtained and/or recorded in the course of the telemedicine visit and may receive copies of available information for a reasonable fee.  I understand that some of the potential risks of receiving the Services via telemedicine include:  Delay or interruption in medical evaluation due to technological equipment failure or disruption; Information transmitted may not be sufficient (e.g.  poor resolution of images) to allow for appropriate medical decision making by the Practitioner; and/or  In rare instances, security protocols could fail, causing a breach of personal health information.  Furthermore, I acknowledge that it is my responsibility to provide information about my medical history, conditions and care that is complete and accurate to the best of my ability. I acknowledge that Practitioner's advice, recommendations, and/or decision may be based on factors not within their control, such as incomplete or inaccurate data provided by me or distortions of diagnostic images or specimens that may result from electronic transmissions. I understand that the practice of medicine is not an exact science and that Practitioner makes no warranties or guarantees regarding treatment outcomes. I acknowledge that a copy of this consent can be made available to me via my patient portal [facility] MyChart), or I can request a printed copy by calling the office of [facility].    I understand that my insurance will be billed for this visit.   I have read or had this consent read to me. I understand the contents of this consent, which adequately explains the benefits and risks of the Services being provided via telemedicine.  I have been provided ample opportunity to ask questions regarding this consent and the Services and have had my questions answered to my satisfaction. I give my informed consent for the services to be provided through the use of telemedicine in my medical care

## 2023-12-04 NOTE — Progress Notes (Unsigned)
Subjective:    Patient ID: [name] [name], male    DOB: 07-16-1951, 72 y.o.   MRN: [id number]  HPI male never smoker followed for OSA, complicated by DM 2, CAD/ CABG, HBP, GERD, chronic pain/narcotic dependence, hyperlipidemia, BPH NPSG 1998:  AHI 58/hr.  --------------------------------------------------------------------------   12/02/22- 72 year old male never smoker followed for OSA, complicated by DM 2, CAD/ CABG, HBP, GERD, chronic pain/narcotic dependence, hyperlipidemia, BPH, Lumbar Disc Disease,  -Ventolin hfa,  CPAP 5-15 auto [employer] Download- compliance    97%, AHI 3.7/ hr   AirSense 10 autoset Body weight today-197 lbs Covid vax-none Flu vax-none ------Pt is doing okay today Stays active, still farming. May need shoulder surgery. Download reviewed. Sleeps better with CPAP.  12/05/23- 72 year old male never smoker followed for OSA, complicated by DM 2, CAD/ CABG, HBP, GERD, chronic pain/narcotic dependence, hyperlipidemia, BPH, Lumbar Disc Disease,  -Ventolin hfa,  CPAP 5-15 auto [employer] Download- compliance   100%, 1.6/hr AirSense 10 autoset Body weight today-192 lbs Discussed the use of AI scribe software for clinical note transcription with the patient, who gave verbal consent to proceed.  History of Present Illness   The patient, with a history of sleep apnea and asthma, presents with head congestion. He denies fever and sore throat. He started over-the-counter cold medicine yesterday for symptom relief. He reports some chest tightness but denies any other respiratory symptoms. He has not used his Ventolin inhaler in a long time.  Regarding his sleep apnea, he is comfortable with his CPAP machine but reports needing a new mask as the current one is beginning to wear out. The patient's machine is approximately 50-85 years old.     ROS-see HPI   + = positive Constitutional:    weight loss, night sweats, fevers, chills, fatigue, lassitude. HEENT:    headaches,  difficulty swallowing, tooth/dental problems, sore throat,       sneezing, itching, ear ache, +nasal congestion, post nasal drip, snoring CV:    chest pain, orthopnea, PND, swelling in lower extremities, anasarca,                                                   dizziness, palpitations Resp:   shortness of breath with exertion or at rest.                productive cough,   non-productive cough, coughing up of blood.              change in color of mucus.  wheezing.   Skin:    rash or lesions. GI:  No-   heartburn, indigestion, abdominal pain, nausea, vomiting, diarrhea,                 change in bowel habits, loss of appetite GU: dysuria, change in color of urine, no urgency or frequency.   flank pain. MS:  + joint pain, stiffness, decreased range of motion, back pain. Neuro-     nothing unusual Psych:  change in mood or affect.  depression or anxiety.   memory loss.    Objective:  OBJ- Physical Exam General- Alert, Oriented, Affect-appropriate, Distress- none acute, + Full beard, +overweight Skin- + sun damage and friable skin forearms with band aids Lymphadenopathy- none Head- atraumatic            Eyes- Gross vision intact, PERRLA, conjunctivae  and secretions clear            Ears- Hearing, canals-normal            Nose- Clear, no-Septal dev, mucus, polyps, erosion, perforation             Throat- Mallampati II-III , mucosa clear , drainage- none, tonsils- atrophic Neck- flexible , trachea midline, no stridor , thyroid nl, carotid no bruit Chest - symmetrical excursion , unlabored           Heart/CV- RRR , no murmur , no gallop  , no rub, nl s1 s2                           - JVD- none , edema- none, stasis changes- none, varices- none           Lung- clear to P&A, wheeze- none, cough- none , dullness-none, rub- none           Chest wall-  Abd-  Br/ Gen/ Rectal- Not done, not indicated Extrem- cyanosis- none, clubbing, none, atrophy- none, strength- nl Neuro- grossly intact to  observation    Assessment & Plan:

## 2023-12-05 ENCOUNTER — Encounter: Payer: Self-pay | Admitting: Internal Medicine

## 2023-12-05 ENCOUNTER — Ambulatory Visit: Payer: Medicare HMO | Admitting: Internal Medicine

## 2023-12-05 VITALS — BP 144/72 | HR 78 | Temp 97.5°F | Ht 65.0 in | Wt 192.2 lb

## 2023-12-05 DIAGNOSIS — G4733 Obstructive sleep apnea (adult) (pediatric): Secondary | ICD-10-CM

## 2023-12-05 MED ORDER — ALBUTEROL SULFATE HFA 108 (90 BASE) MCG/ACT IN AERS: 2.0000 | INHALATION_SPRAY | Freq: Four times a day (QID) | RESPIRATORY_TRACT | 12 refills | Status: AC | PRN

## 2023-12-05 NOTE — Patient Instructions (Signed)
Order- DME Christoper Allegra- please replace ask of choice- he likes one he is using now. Continue auto 5-15, mask of choice, humidifier, supplies, AirView/ card  Refill sent for albuterol inhaler-- inhale 2 puffs every 6 hours as needed

## 2023-12-05 NOTE — Addendum Note (Signed)
Addended byClyda Greener M on: 12/05/2023 02:16 PM   Modules accepted: Orders

## 2023-12-06 ENCOUNTER — Other Ambulatory Visit: Payer: Self-pay | Admitting: Internal Medicine

## 2023-12-06 NOTE — Telephone Encounter (Signed)
 Name of Medication: Hydrocodone  Name of Pharmacy: [facility] or Written Date and Quantity: 07-23-23 #60 Last Office Visit and Type: 10-20-23 Next Office Visit and Type: 04-20-24 Last Controlled Substance Agreement Date: 10-18-22 Last UDS: 10-18-22

## 2023-12-08 ENCOUNTER — Other Ambulatory Visit (HOSPITAL_COMMUNITY): Payer: Self-pay

## 2023-12-12 ENCOUNTER — Encounter: Payer: Self-pay | Admitting: Nurse Practitioner

## 2023-12-12 ENCOUNTER — Other Ambulatory Visit: Payer: Self-pay | Admitting: Internal Medicine

## 2023-12-12 ENCOUNTER — Ambulatory Visit: Payer: Medicare HMO | Attending: Cardiology | Admitting: Nurse Practitioner

## 2023-12-12 DIAGNOSIS — Z01810 Encounter for preprocedural cardiovascular examination: Secondary | ICD-10-CM | POA: Diagnosis not present

## 2023-12-12 NOTE — Progress Notes (Signed)
 Virtual Visit via Telephone Note   Because of [name] [name]'s co-morbid illnesses, he is at least at moderate risk for complications without adequate follow up.  This format is felt to be most appropriate for this patient at this time.  The patient did not have access to video technology/had technical difficulties with video requiring transitioning to audio format only (telephone).  All issues noted in this document were discussed and addressed.  No physical exam could be performed with this format.  Please refer to the patient's chart for his consent to telehealth for [facility].  Evaluation Performed:  Preoperative cardiovascular risk assessment _____________   Date:  12/12/2023   Patient ID:  [name] [name], DOB 02/07/1951, MRN [id number] Patient Location:  Home Provider location:   Office  Primary Care Provider:  Jimmy [name] FERNS, MD Primary Cardiologist:  Evalene Lunger, MD  Chief Complaint / Patient Profile   73 y.o. y/o male with a h/o CAD s/p CABG, hyperlipidemia, HTN, diabetes, unintentional weight loss, OSA who is pending endoscopy on 12/29/23 and presents today for telephonic preoperative cardiovascular risk assessment.  History of Present Illness    [name] [name] is a 73 y.o. male who presents via audio/video conferencing for a telehealth visit today.  Pt was last seen in cardiology clinic on 04/18/2023 by Dr. Gollan.  At that time DAINE GUNTHER was doing well.  The patient is now pending procedure as outlined above. Since his last visit, he denies reports occasional twinges of chest discomfort, some weight gain and some swelling. He denies shortness of breath, fatigue, palpitations, melena, hematuria, hemoptysis, diaphoresis, weakness, presyncope, syncope, orthopnea, and PND. He admits to weight gain. He has taken Lasix  on occasion which has helped resolve symptoms. He remains very active with cattle farming and does not have worsening of  symptoms with moderate to heavy exertion.  I have asked him to let us  know if symptoms worsen.  Past Medical History    Past Medical History:  Diagnosis Date   Angina    Arthritis    BPH (benign prostatic hypertrophy)    Carpal tunnel syndrome    Coronary artery disease    a. 11/2011 s/p CABG x 3  (LIMA-LAD, SeqSVG-D1-D3); b. 09/2019 CATH-RCA PCI: LM 50ost, LAD 100ost/p, Sev diff diag dzs, LCX nl, OM1 66m, OM3 40, RCA 70ost (iFR 0.88--> 3.0x26 Resolute Onyx DES), 48m, LIMA->LAD ok. VG->D1->D3 ok. EF 50-55%.; c) 10/01/2019: staged PCI Ost LM (Resolute Onyx 3.5 x 12 - 4.2 mm) & Ost OM1 (Resolute Onyx 2.25 x 26 -- 2.5 mm)   GERD (gastroesophageal reflux disease)    Hx of colonic polyps    Hyperlipidemia    Hypertension    Kidney stones    Morbid obesity (HCC)    OSA (obstructive sleep apnea)    uses cpap   Type II diabetes mellitus (HCC)    Past Surgical History:  Procedure Laterality Date   CARDIAC CATHETERIZATION     CORONARY ARTERY BYPASS GRAFT  11/12/2011   Procedure: CORONARY ARTERY BYPASS GRAFTING (CABG);  Surgeon: Dallas KATHEE Jude, MD;  Location: [facility] OR;  Service: Open Heart Surgery;  Laterality: N/A;  Coronary Artery Bypass Graft times three on pump utilizing left internal mammary artery and right saphenous vein harvested endoscopically    CORONARY PRESSURE/FFR STUDY N/A 09/27/2019   Procedure: INTRAVASCULAR PRESSURE WIRE/FFR STUDY;  Surgeon: Darron Deatrice LABOR, MD;  Location: [facility] INVASIVE CV LAB;  Service: Cardiovascular;  Laterality: N/A;   CORONARY STENT  INTERVENTION N/A 09/27/2019   Procedure: CORONARY STENT INTERVENTION;  Surgeon: Darron Deatrice LABOR, MD;  Location: [facility] INVASIVE CV LAB;  Service: Cardiovascular;  Laterality: N/A;   CORONARY STENT INTERVENTION N/A 10/01/2019   Procedure: CORONARY STENT INTERVENTION;  Surgeon: Mady Bruckner, MD;  Location: [facility] INVASIVE CV LAB;  Service: Cardiovascular;  Laterality: N/A;   CORONARY ULTRASOUND/IVUS N/A 10/01/2019   Procedure:  Intravascular Ultrasound/IVUS;  Surgeon: Mady Bruckner, MD;  Location: [facility] INVASIVE CV LAB;  Service: Cardiovascular;  Laterality: N/A;   HERNIA REPAIR     KNEE ARTHROPLASTY Right 11/23/2017   Procedure: COMPUTER ASSISTED TOTAL KNEE ARTHROPLASTY;  Surgeon: Mardee Lynwood SQUIBB, MD;  Location: [facility] ORS;  Service: Orthopedics;  Laterality: Right;   KNEE SURGERY     left   LEFT HEART CATH AND CORONARY ANGIOGRAPHY Left 09/27/2019   Procedure: LEFT HEART CATH AND CORONARY ANGIOGRAPHY;  Surgeon: Perla Evalene PARAS, MD;  Location: [facility] INVASIVE CV LAB;  Service: Cardiovascular;  Laterality: Left;   LEFT HEART CATH AND CORONARY ANGIOGRAPHY N/A 10/01/2019   Procedure: LEFT HEART CATH AND CORONARY ANGIOGRAPHY;  Surgeon: Mady Bruckner, MD;  Location: [facility] INVASIVE CV LAB;  Service: Cardiovascular;  Laterality: N/A;   MENISCUS REPAIR  03/08   right knee    Allergies  Allergies  Allergen Reactions   Doxazosin Mesylate Other (See Comments)    REACTION: didn't work and may have caused SOB   Tetracyclines & Related    Carvedilol  Other (See Comments)    Felt bad   Crestor  [Rosuvastatin ] Other (See Comments)    Muscle aches.   Doxycycline Other (See Comments) and Rash    REACTION: unspecified   Glimepiride Other (See Comments)    REACTION: red eyes   Glipizide  Other (See Comments)    REACTION: myalgia??   Losartan  Potassium-Hctz Other (See Comments)    REACTION: chest \\T \ leg pain   Metoprolol  Tartrate Other (See Comments)    Mood swings, memory changes   Pravastatin  Other (See Comments)    Muscle aches    Rosiglitazone Maleate Other (See Comments)    REACTION: myalgia   Simvastatin  Other (See Comments)    REACTION: myalgias   Tetracycline Rash    Home Medications    Prior to Admission medications   Medication Sig Start Date End Date Taking? Authorizing Provider  Accu-Chek FastClix Lancets MISC Use to obtain blood sugar sample once daily. Dx code E11.8 01/18/23   Jimmy [name] FERNS, MD   albuterol  (VENTOLIN  HFA) 108 [phone] Base) MCG/ACT inhaler Inhale 2 puffs into the lungs every 6 (six) hours as needed for wheezing or shortness of breath. 12/05/23   Neysa Rama D, MD  Ascorbic Acid (VITAMIN C) 1000 MG tablet Take 1,000 mg by mouth daily.    [provider]  aspirin  EC 81 MG tablet Take 1 tablet (81 mg total) by mouth daily. 08/20/19   Abigail Bernardino HERO, PA-C  clopidogrel  (PLAVIX ) 75 MG tablet Take 1 tablet (75 mg total) by mouth daily with breakfast. Please call [phone] for an appointment for further refills. 04/18/23   Gollan, Timothy J, MD  Dulaglutide  (TRULICITY ) 3 MG/0.5ML SOPN Inject 3 mg as directed once a week. 01/18/23   [name], Toribio I, MD  Evolocumab  (REPATHA  SURECLICK) 140 MG/ML SOAJ Inject 140 mg into the skin every 14 (fourteen) days. 06/17/23   Gollan, Timothy J, MD  furosemide  (LASIX ) 40 MG tablet Take 1 tablet (40 mg total) by mouth daily as needed. 04/18/23   Gollan, Timothy J, MD  glipiZIDE  (GLUCOTROL  XL) 5 MG 24 hr tablet TAKE ONE TABLET (5 MG TOTAL) BY MOUTH DAILY WITH BREAKFAST. 10/11/23   Jimmy Ade I, MD  glucose blood (ACCU-CHEK GUIDE) test strip Use to check blood sugar once daily Dx code E11.8 01/18/23   Jimmy Ade FERNS, MD  HYDROcodone -acetaminophen  (NORCO/VICODIN) 5-325 MG tablet TAKE ONE TABLET BY MOUTH TWICE A DAY 12/06/23   Jimmy Ade FERNS, MD  INVOKANA  300 MG TABS tablet TAKE ONE TABLET BY MOUTH ONCE A DAY BEFORE BREAKFAST 04/04/23   Jimmy Ade FERNS, MD  LORazepam  (ATIVAN ) 0.5 MG tablet Take 0.5-1 tablets (0.25-0.5 mg total) by mouth 2 (two) times daily as needed for anxiety. 12/19/20   [name], Geovanie I, MD  metFORMIN  (GLUCOPHAGE ) 1000 MG tablet TAKE 1 TABLET BY MOUTH TWICE (2) DAILY WITH A MEAL 01/18/23   Jimmy Ade FERNS, MD  nitroGLYCERIN  (NITROSTAT ) 0.4 MG SL tablet Place 1 tablet (0.4 mg total) under the tongue every 5 (five) minutes as needed for chest pain. 10/18/22   [name], Garrette I, MD  potassium chloride  SA (KLOR-CON  M) 20  MEQ tablet Take 1 tablet (20 mEq total) by mouth 2 (two) times daily as needed (Take with lasix ). 04/18/23   Gollan, Evalene PARAS, MD  predniSONE  (DELTASONE ) 5 MG tablet TAKE TWO TO FOUR TABLETS BY MOUTH DAILY WITH BREAKFAST Patient taking differently: Take 10 mg by mouth daily with breakfast. 09/12/23   Jimmy Ade FERNS, MD  tamsulosin  (FLOMAX ) 0.4 MG CAPS capsule TAKE ONE CAPSULE BY MOUTH ONCE DAILY 04/04/23   Jimmy Ade FERNS, MD  triamcinolone  cream (KENALOG ) 0.1 % Apply 1 Application topically 2 (two) times daily as needed. 08/16/23   Jimmy Ade FERNS, MD    Physical Exam    Vital Signs:  Ade JONETTA [name] does not have vital signs available for review today.  Given telephonic nature of communication, physical exam is limited. AAOx3. NAD. Normal affect.  Speech and respirations are unlabored.  Accessory Clinical Findings    None  Assessment & Plan    1.  Preoperative Cardiovascular Risk Assessment: According to the Revised Cardiac Risk Index (RCRI), his Perioperative Risk of Major Cardiac Event is (%): 6.6. His Functional Capacity in METs is: 7.59 according to the Duke Activity Status Index (DASI). The patient is doing well from a cardiac perspective. Therefore, based on ACC/AHA guidelines, the patient would be at acceptable risk for the planned procedure without further cardiovascular testing.    The patient was advised that if he develops new symptoms prior to surgery to contact our office to arrange for a follow-up visit, and he verbalized understanding.  His Plavix  may be held for 5 days prior to his procedure. Please resume as soon as hemostasis is achieved.   A copy of this note will be routed to requesting surgeon.  Time:   Today, I have spent 10 minutes with the patient with telehealth technology discussing medical history, symptoms, and management plan.    Rosaline EMERSON Bane, NP-C  12/12/2023, 1:40 PM [address] Office [phone] Fax [phone]

## 2023-12-15 DIAGNOSIS — B0233 Zoster keratitis: Secondary | ICD-10-CM | POA: Diagnosis not present

## 2023-12-21 DIAGNOSIS — B0233 Zoster keratitis: Secondary | ICD-10-CM | POA: Diagnosis not present

## 2023-12-22 ENCOUNTER — Encounter: Payer: Self-pay | Admitting: Gastroenterology

## 2023-12-23 DIAGNOSIS — H43822 Vitreomacular adhesion, left eye: Secondary | ICD-10-CM | POA: Diagnosis not present

## 2023-12-23 DIAGNOSIS — B0233 Zoster keratitis: Secondary | ICD-10-CM | POA: Diagnosis not present

## 2023-12-23 DIAGNOSIS — H10413 Chronic giant papillary conjunctivitis, bilateral: Secondary | ICD-10-CM | POA: Diagnosis not present

## 2023-12-29 ENCOUNTER — Ambulatory Visit: Payer: Medicare HMO | Admitting: Gastroenterology

## 2023-12-29 ENCOUNTER — Encounter: Payer: Self-pay | Admitting: Gastroenterology

## 2023-12-29 VITALS — BP 139/67 | HR 64 | Temp 98.2°F | Resp 14 | Ht 65.0 in | Wt 185.0 lb

## 2023-12-29 DIAGNOSIS — D122 Benign neoplasm of ascending colon: Secondary | ICD-10-CM

## 2023-12-29 DIAGNOSIS — I10 Essential (primary) hypertension: Secondary | ICD-10-CM | POA: Diagnosis not present

## 2023-12-29 DIAGNOSIS — D123 Benign neoplasm of transverse colon: Secondary | ICD-10-CM | POA: Diagnosis not present

## 2023-12-29 DIAGNOSIS — Z1211 Encounter for screening for malignant neoplasm of colon: Secondary | ICD-10-CM | POA: Diagnosis not present

## 2023-12-29 DIAGNOSIS — I2510 Atherosclerotic heart disease of native coronary artery without angina pectoris: Secondary | ICD-10-CM | POA: Diagnosis not present

## 2023-12-29 DIAGNOSIS — D120 Benign neoplasm of cecum: Secondary | ICD-10-CM

## 2023-12-29 DIAGNOSIS — R195 Other fecal abnormalities: Secondary | ICD-10-CM | POA: Diagnosis not present

## 2023-12-29 DIAGNOSIS — G4733 Obstructive sleep apnea (adult) (pediatric): Secondary | ICD-10-CM | POA: Diagnosis not present

## 2023-12-29 MED ORDER — SODIUM CHLORIDE 0.9 % IV SOLN: 500.0000 mL | Freq: Once | INTRAVENOUS | Status: DC

## 2023-12-29 NOTE — Patient Instructions (Addendum)
Resume Plavix (clopidogrel) at prior dose in 2 days (Saturday). Await pathology results. Repeat colonoscopy (date not yet determined) for surveillance based on pathology results.  Please read over handout about polyps                         YOU HAD AN ENDOSCOPIC PROCEDURE TODAY AT THE [facility]:   Refer to the procedure report that was given to you for any specific questions about what was found during the examination.  If the procedure report does not answer your questions, please call your gastroenterologist to clarify.  If you requested that your care partner not be given the details of your procedure findings, then the procedure report has been included in a sealed envelope for you to review at your convenience later.  YOU SHOULD EXPECT: Some feelings of bloating in the abdomen. Passage of more gas than usual.  Walking can help get rid of the air that was put into your GI tract during the procedure and reduce the bloating. If you had a lower endoscopy (such as a colonoscopy or flexible sigmoidoscopy) you may notice spotting of blood in your stool or on the toilet paper. If you underwent a bowel prep for your procedure, you may not have a normal bowel movement for a few days.  Please Note:  You might notice some irritation and congestion in your nose or some drainage.  This is from the oxygen used during your procedure.  There is no need for concern and it should clear up in a day or so.  SYMPTOMS TO REPORT IMMEDIATELY:  Following lower endoscopy (colonoscopy or flexible sigmoidoscopy):  Excessive amounts of blood in the stool  Significant tenderness or worsening of abdominal pains  Swelling of the abdomen that is new, acute  Fever of 100F or higher For urgent or emergent issues, a gastroenterologist can be reached at any hour by calling [phone]. Do not use MyChart messaging for urgent concerns.    DIET:  We do recommend a small meal at first, but then you may  proceed to your regular diet.  Drink plenty of fluids but you should avoid alcoholic beverages for 24 hours.  ACTIVITY:  You should plan to take it easy for the rest of today and you should NOT DRIVE or use heavy machinery until tomorrow (because of the sedation medicines used during the test).    FOLLOW UP: Our staff will call the number listed on your records the next business day following your procedure.  We will call around 7:15- 8:00 am to check on you and address any questions or concerns that you may have regarding the information given to you following your procedure. If we do not reach you, we will leave a message.     If any biopsies were taken you will be contacted by phone or by letter within the next 1-3 weeks.  Please call us at [phone] if you have not heard about the biopsies in 3 weeks.    SIGNATURES/CONFIDENTIALITY: You and/or your care partner have signed paperwork which will be entered into your electronic medical record.  These signatures attest to the fact that that the information above on your After Visit Summary has been reviewed and is understood.  Full responsibility of the confidentiality of this discharge information lies with you and/or your care-partner.

## 2023-12-29 NOTE — Progress Notes (Signed)
Pt awake and oriented, resps regular and even,VSS pleased with anesthesia

## 2023-12-29 NOTE — Op Note (Signed)
[facility] Patient Name: [name] [name] Procedure Date: 12/29/2023 1:55 PM MRN: [id number] Endoscopist: Lorin Picket E. Tomasa Rand , MD, [id number] Age: 73 Referring MD:  Date of Birth: 10-26-1951 Gender: Male Account #: [id number] Procedure:                Colonoscopy Indications:              Positive fecal immunochemical test Medicines:                Monitored Anesthesia Care Procedure:                Pre-Anesthesia Assessment:                           - Prior to the procedure, a History and Physical                            was performed, and patient medications and                            allergies were reviewed. The patient's tolerance of                            previous anesthesia was also reviewed. The risks                            and benefits of the procedure and the sedation                            options and risks were discussed with the patient.                            All questions were answered, and informed consent                            was obtained. Prior Anticoagulants: The patient has                            taken Plavix (clopidogrel), last dose was 5 days                            prior to procedure. ASA Grade Assessment: III - A                            patient with severe systemic disease. After                            reviewing the risks and benefits, the patient was                            deemed in satisfactory condition to undergo the                            procedure.  After obtaining informed consent, the colonoscope                            was passed under direct vision. Throughout the                            procedure, the patient's blood pressure, pulse, and                            oxygen saturations were monitored continuously. The                            CF [id number] #[id number] was introduced through the anus                            and advanced to the the terminal  ileum, with                            identification of the appendiceal orifice and IC                            valve. The colonoscopy was performed without                            difficulty. The patient tolerated the procedure                            well. The quality of the bowel preparation was                            adequate. The terminal ileum, ileocecal valve,                            appendiceal orifice, and rectum were photographed.                            The bowel preparation used was SUPREP via split                            dose instruction. Scope In: 2:11:26 PM Scope Out: 2:40:28 PM Scope Withdrawal Time: 0 hours 25 minutes 42 seconds  Total Procedure Duration: 0 hours 29 minutes 2 seconds  Findings:                 The perianal and digital rectal examinations were                            normal. Pertinent negatives include normal                            sphincter tone and no palpable rectal lesions.                           A 16 mm polyp was found in the cecum. The polyp was  sessile. The polyp was removed with a piecemeal                            technique using a cold snare. Resection and                            retrieval were complete. Estimated blood loss was                            minimal.                           Two sessile polyps were found in the cecum. The                            polyps were 2 to 3 mm in size. These polyps were                            removed with a cold snare. Resection and retrieval                            were complete. Estimated blood loss was minimal.                           A 7 mm polyp was found in the ascending colon. The                            polyp was sessile. The polyp was removed with a                            cold snare. Resection and retrieval were complete.                            Estimated blood loss was minimal.                           A 12 mm  polyp was found in the transverse colon.                            The polyp was sessile. The polyp was removed en                            bloc with a cold snare. Resection and retrieval                            were complete. Estimated blood loss was minimal.                           The exam was otherwise normal throughout the                            examined colon.  The terminal ileum appeared normal.                           The retroflexed view of the distal rectum and anal                            verge was normal and showed no anal or rectal                            abnormalities. Complications:            No immediate complications. Estimated Blood Loss:     Estimated blood loss was minimal. Impression:               - One 16 mm polyp in the cecum, removed piecemeal                            using a cold snare. Resected and retrieved.                           - Two 2 to 3 mm polyps in the cecum, removed with a                            cold snare. Resected and retrieved.                           - One 7 mm polyp in the ascending colon, removed                            with a cold snare. Resected and retrieved.                           - One 12 mm polyp in the transverse colon, removed                            with a cold snare. Resected and retrieved.                           - The examined portion of the ileum was normal.                           - The distal rectum and anal verge are normal on                            retroflexion view. Recommendation:           - Patient has a contact number available for                            emergencies. The signs and symptoms of potential                            delayed complications were discussed with the  patient. Return to normal activities tomorrow.                            Written discharge instructions were provided to the                             patient.                           - Resume previous diet.                           - Resume Plavix (clopidogrel) at prior dose in 2                            days (Saturday).                           - Await pathology results.                           - Repeat colonoscopy (date not yet determined) for                            surveillance based on pathology results. Bernyce Brimley E. Tomasa Rand, MD 12/29/2023 2:49:48 PM This report has been signed electronically.

## 2023-12-29 NOTE — Progress Notes (Signed)
Called to room to assist during endoscopic procedure.  Patient ID and intended procedure confirmed with present staff. Received instructions for my participation in the procedure from the performing physician.  

## 2023-12-29 NOTE — Progress Notes (Signed)
[facility] History and Physical   Primary Care Physician:  Karie Schwalbe, MD   Reason for Procedure:   Positive FIT test  Plan:    Colonoscopy     HPI: [name] [name] is a 73 y.o. male undergoing colonoscopy following a positive FIT test.  He had a colonoscopy in 2008 in which a tubular adenoma was removed.  He has no family history of colon cancer and no chronic GI symptoms. He takes Plavix because of a history of CAD (CABG 2012, stent 2020), last dose Jan 17.   Past Medical History:  Diagnosis Date   Angina    Arthritis    BPH (benign prostatic hypertrophy)    Carpal tunnel syndrome    Coronary artery disease    a. 11/2011 s/p CABG x 3  (LIMA-LAD, SeqSVG-D1-D3); b. 09/2019 CATH-RCA PCI: LM 50ost, LAD 100ost/p, Sev diff diag dzs, LCX nl, OM1 52m, OM3 40, RCA 70ost (iFR 0.88--> 3.0x26 Resolute Onyx DES), 86m, LIMA->LAD ok. VG->D1->D3 ok. EF 50-55%.; c) 10/01/2019: staged PCI Ost LM (Resolute Onyx 3.5 x 12 - 4.2 mm) & Ost OM1 (Resolute Onyx 2.25 x 26 -- 2.5 mm)   GERD (gastroesophageal reflux disease)    Hx of colonic polyps    Hyperlipidemia    Hypertension    Kidney stones    Morbid obesity (HCC)    OSA (obstructive sleep apnea)    uses cpap   Type II diabetes mellitus (HCC)     Past Surgical History:  Procedure Laterality Date   CARDIAC CATHETERIZATION     CORONARY ARTERY BYPASS GRAFT  11/12/2011   Procedure: CORONARY ARTERY BYPASS GRAFTING (CABG);  Surgeon: Delight Ovens, MD;  Location: [facility] OR;  Service: Open Heart Surgery;  Laterality: N/A;  Coronary Artery Bypass Graft times three on pump utilizing left internal mammary artery and right saphenous vein harvested endoscopically    CORONARY PRESSURE/FFR STUDY N/A 09/27/2019   Procedure: INTRAVASCULAR PRESSURE WIRE/FFR STUDY;  Surgeon: Iran Ouch, MD;  Location: [facility] INVASIVE CV LAB;  Service: Cardiovascular;  Laterality: N/A;   CORONARY STENT INTERVENTION N/A 09/27/2019   Procedure:  CORONARY STENT INTERVENTION;  Surgeon: Iran Ouch, MD;  Location: [facility] INVASIVE CV LAB;  Service: Cardiovascular;  Laterality: N/A;   CORONARY STENT INTERVENTION N/A 10/01/2019   Procedure: CORONARY STENT INTERVENTION;  Surgeon: Yvonne Kendall, MD;  Location: [facility] INVASIVE CV LAB;  Service: Cardiovascular;  Laterality: N/A;   CORONARY ULTRASOUND/IVUS N/A 10/01/2019   Procedure: Intravascular Ultrasound/IVUS;  Surgeon: Yvonne Kendall, MD;  Location: [facility] INVASIVE CV LAB;  Service: Cardiovascular;  Laterality: N/A;   HERNIA REPAIR     KNEE ARTHROPLASTY Right 11/23/2017   Procedure: COMPUTER ASSISTED TOTAL KNEE ARTHROPLASTY;  Surgeon: Donato Heinz, MD;  Location: [facility] ORS;  Service: Orthopedics;  Laterality: Right;   KNEE SURGERY     left   LEFT HEART CATH AND CORONARY ANGIOGRAPHY Left 09/27/2019   Procedure: LEFT HEART CATH AND CORONARY ANGIOGRAPHY;  Surgeon: Antonieta Iba, MD;  Location: [facility] INVASIVE CV LAB;  Service: Cardiovascular;  Laterality: Left;   LEFT HEART CATH AND CORONARY ANGIOGRAPHY N/A 10/01/2019   Procedure: LEFT HEART CATH AND CORONARY ANGIOGRAPHY;  Surgeon: Yvonne Kendall, MD;  Location: [facility] INVAS[facility] CV LAB;  Service: Cardiovascular;  Laterality: N/A;   MENISCUS REPAIR  03/08   right knee    Prior to Admission medications   Medication Sig Start Date End Date Taking? Authorizing Provider  Accu-Chek FastClix Lancets MISC Use to obtain blood  sugar sample once daily. Dx code E11.8 01/18/23  Yes Karie Schwalbe, MD  aspirin EC 81 MG tablet Take 1 tablet (81 mg total) by mouth daily. 08/20/19  Yes Dunn, Ryan M, PA-C  Dulaglutide (TRULICITY) 3 MG/0.5ML SOPN Inject 3 mg as directed once a week. 01/18/23  Yes Karie Schwalbe, MD  erythromycin ophthalmic ointment Place 1 Application into the left eye once. 12/15/23  Yes [provider]  Evolocumab (REPATHA SURECLICK) 140 MG/ML SOAJ Inject 140 mg into the skin every 14 (fourteen) days. 06/17/23  Yes Gollan, Tollie Pizza, MD  glipiZIDE (GLUCOTROL XL) 5 MG 24 hr tablet TAKE ONE TABLET (5 MG TOTAL) BY MOUTH DAILY WITH BREAKFAST. 10/11/23  Yes Tillman Abide I, MD  glucose blood (ACCU-CHEK GUIDE) test strip Use to check blood sugar once daily Dx code E11.8 01/18/23  Yes Karie Schwalbe, MD  HYDROcodone-acetaminophen (NORCO/VICODIN) 5-325 MG tablet TAKE ONE TABLET BY MOUTH TWICE A DAY 12/06/23  Yes Karie Schwalbe, MD  INVOKANA 300 MG TABS tablet TAKE ONE TABLET BY MOUTH ONCE A DAY BEFORE BREAKFAST 12/12/23  Yes Karie Schwalbe, MD  LORazepam (ATIVAN) 0.5 MG tablet Take 0.5-1 tablets (0.25-0.5 mg total) by mouth 2 (two) times daily as needed for anxiety. 12/19/20  Yes Tillman Abide I, MD  metFORMIN (GLUCOPHAGE) 1000 MG tablet TAKE 1 TABLET BY MOUTH TWICE (2) DAILY WITH A MEAL 01/18/23  Yes Karie Schwalbe, MD  prednisoLONE acetate (PRED FORTE) 1 % ophthalmic suspension Place 1 drop into the left eye every 2 (two) hours while awake. 12/21/23  Yes [provider]  predniSONE (DELTASONE) 5 MG tablet TAKE TWO TO FOUR TABLETS BY MOUTH DAILY WITH BREAKFAST Patient taking differently: Take 10 mg by mouth daily with breakfast. 09/12/23  Yes Tillman Abide I, MD  tamsulosin (FLOMAX) 0.4 MG CAPS capsule TAKE ONE CAPSULE BY MOUTH ONCE DAILY 04/04/23  Yes Karie Schwalbe, MD  valACYclovir (VALTREX) 1000 MG tablet Take 1,000 mg by mouth 3 (three) times daily. 12/26/23  Yes [provider]  albuterol (VENTOLIN HFA) 108 (90 Base) MCG/ACT inhaler Inhale 2 puffs into the lungs every 6 (six) hours as needed for wheezing or shortness of breath. 12/05/23   Jetty Duhamel D, MD  Ascorbic Acid (VITAMIN C) 1000 MG tablet Take 1,000 mg by mouth daily.    [provider]  clopidogrel (PLAVIX) 75 MG tablet Take 1 tablet (75 mg total) by mouth daily with breakfast. Please call [phone] for an appointment for further refills. 04/18/23   Antonieta Iba, MD  furosemide (LASIX) 40 MG tablet Take 1 tablet (40 mg  total) by mouth daily as needed. 04/18/23   Antonieta Iba, MD  nitroGLYCERIN (NITROSTAT) 0.4 MG SL tablet Place 1 tablet (0.4 mg total) under the tongue every 5 (five) minutes as needed for chest pain. 10/18/22   Karie Schwalbe, MD  potassium chloride SA (KLOR-CON M) 20 MEQ tablet Take 1 tablet (20 mEq total) by mouth 2 (two) times daily as needed (Take with lasix). 04/18/23   Antonieta Iba, MD  triamcinolone cream (KENALOG) 0.1 % Apply 1 Application topically 2 (two) times daily as needed. 08/16/23   Karie Schwalbe, MD    Current Outpatient Medications  Medication Sig Dispense Refill   Accu-Chek FastClix Lancets MISC Use to obtain blood sugar sample once daily. Dx code E11.8 102 each 4   aspirin EC 81 MG tablet Take 1 tablet (81 mg total) by mouth daily. 90  tablet 3   Dulaglutide (TRULICITY) 3 MG/0.5ML SOPN Inject 3 mg as directed once a week. 2 mL 11   erythromycin ophthalmic ointment Place 1 Application into the left eye once.     Evolocumab (REPATHA SURECLICK) 140 MG/ML SOAJ Inject 140 mg into the skin every 14 (fourteen) days. 6 mL 3   glipiZIDE (GLUCOTROL XL) 5 MG 24 hr tablet TAKE ONE TABLET (5 MG TOTAL) BY MOUTH DAILY WITH BREAKFAST. 30 tablet 11   glucose blood (ACCU-CHEK GUIDE) test strip Use to check blood sugar once daily Dx code E11.8 100 each 4   HYDROcodone-acetaminophen (NORCO/VICODIN) 5-325 MG tablet TAKE ONE TABLET BY MOUTH TWICE A DAY 60 tablet 0   INVOKANA 300 MG TABS tablet TAKE ONE TABLET BY MOUTH ONCE A DAY BEFORE BREAKFAST 90 tablet 3   LORazepam (ATIVAN) 0.5 MG tablet Take 0.5-1 tablets (0.25-0.5 mg total) by mouth 2 (two) times daily as needed for anxiety. 30 tablet 0   metFORMIN (GLUCOPHAGE) 1000 MG tablet TAKE 1 TABLET BY MOUTH TWICE (2) DAILY WITH A MEAL 180 tablet 3   prednisoLONE acetate (PRED FORTE) 1 % ophthalmic suspension Place 1 drop into the left eye every 2 (two) hours while awake.     predniSONE (DELTASONE) 5 MG tablet TAKE TWO TO FOUR TABLETS BY  MOUTH DAILY WITH BREAKFAST (Patient taking differently: Take 10 mg by mouth daily with breakfast.) 120 tablet 2   tamsulosin (FLOMAX) 0.4 MG CAPS capsule TAKE ONE CAPSULE BY MOUTH ONCE DAILY 90 capsule 3   valACYclovir (VALTREX) 1000 MG tablet Take 1,000 mg by mouth 3 (three) times daily.     albuterol (VENTOLIN HFA) 108 (90 Base) MCG/ACT inhaler Inhale 2 puffs into the lungs every 6 (six) hours as needed for wheezing or shortness of breath. 18 g 12   Ascorbic Acid (VITAMIN C) 1000 MG tablet Take 1,000 mg by mouth daily.     clopidogrel (PLAVIX) 75 MG tablet Take 1 tablet (75 mg total) by mouth daily with breakfast. Please call [phone] for an appointment for further refills. 90 tablet 3   furosemide (LASIX) 40 MG tablet Take 1 tablet (40 mg total) by mouth daily as needed. 90 tablet 3   nitroGLYCERIN (NITROSTAT) 0.4 MG SL tablet Place 1 tablet (0.4 mg total) under the tongue every 5 (five) minutes as needed for chest pain. 25 tablet 3   potassium chloride SA (KLOR-CON M) 20 MEQ tablet Take 1 tablet (20 mEq total) by mouth 2 (two) times daily as needed (Take with lasix). 90 tablet 3   triamcinolone cream (KENALOG) 0.1 % Apply 1 Application topically 2 (two) times daily as needed. 30 g 0   Current Facility-Administered Medications  Medication Dose Route Frequency Provider Last Rate Last Admin   0.9 %  sodium chloride infusion  500 mL Intravenous Once Jenel Lucks, MD        Allergies as of 12/29/2023 - Review Complete 12/29/2023  Allergen Reaction Noted   Doxazosin mesylate Other (See Comments) 03/04/2008   Carvedilol Other (See Comments) 08/22/2013   Crestor [rosuvastatin] Other (See Comments) 08/29/2012   Doxycycline Other (See Comments) and Rash 01/24/2007   Glimepiride Other (See Comments) 01/24/2007   Glipizide Other (See Comments) 12/22/2006   Losartan potassium-hctz Other (See Comments) 08/03/2010   Metoprolol tartrate Other (See Comments) 04/18/2013   Pravastatin Other  (See Comments) 08/29/2012   Rosiglitazone maleate Other (See Comments) 12/22/2006   Simvastatin Other (See Comments) 01/24/2007   Tetracycline Rash 12/22/2021  Tetracyclines & related Rash 03/17/2017    Family History  Problem Relation Age of Onset   Coronary artery disease Father    Prostate cancer Father    Crohn's disease Paternal Uncle    Diabetes Neg Hx    Hypertension Neg Hx    Colon cancer Neg Hx    Esophageal cancer Neg Hx    Stomach cancer Neg Hx    Rectal cancer Neg Hx     Social History   Socioeconomic History   Marital status: Married    Spouse name: Not on file   Number of children: 3   Years of education: Not on file   Highest education level: Associate degree: academic program  Occupational History   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   Occupation: Biochemist, clinical &/or corn farming   Occupation: retired  Tobacco Use   Smoking status: Never    Passive exposure: Never   Smokeless tobacco: Never  Vaping Use   Vaping status: Never Used  Substance and Sexual Activity   Alcohol use: No   Drug use: No   Sexual activity: Yes  Other Topics Concern   Not on file  Social History Narrative   No living will   Requests wife as health care POA--no clear alternate   Would accept resuscitation   Would accept tube feedings--not sure about duration   Social Drivers of Corporate investment banker Strain: Low Risk  (10/20/2023)   Overall Financial Resource Strain (CARDIA)    Difficulty of Paying Living Expenses: Not hard at all  Food Insecurity: No Food Insecurity (10/20/2023)   Hunger Vital Sign    Worried About Running Out of Food in the Last Year: Never true    Ran Out of Food in the Last Year: Never true  Transportation Needs: No Transportation Needs (10/20/2023)   PRAPARE - Administrator, Civil Service (Medical): No    Lack of Transportation (Non-Medical): No  Physical Activity: Sufficiently Active (10/20/2023)   Exercise Vital Sign     Days of Exercise per Week: 5 days    Minutes of Exercise per Session: 30 min  Stress: No Stress Concern Present (10/20/2023)   [employer] of Occupational Health - Occupational Stress Questionnaire    Feeling of Stress : Not at all  Social Connections: Unknown (10/20/2023)   Social Connection and Isolation Panel [NHANES]    Frequency of Communication with Friends and Family: Once a week    Frequency of Social Gatherings with Friends and Family: Once a week    Attends Religious Services: Patient declined    Database administrator or Organizations: No    Attends Engineer, structural: Not on file    Marital Status: Married  Catering manager Violence: Not on file    Review of Systems:  All other review of systems negative except as mentioned in the HPI.  Physical Exam: Vital signs BP (!) 152/84   Pulse 70   Temp 98.2 F (36.8 C) (Temporal)   Ht 5\' 5"  (1.651 m)   Wt 185 lb (83.9 kg)   SpO2 100%   BMI 30.79 kg/m   General:   Alert,  Well-developed, well-nourished, pleasant and cooperative in NAD Airway:  Mallampati 2 Lungs:  Clear throughout to auscultation.   Heart:  Regular rate and rhythm; no murmurs, clicks, rubs,  or gallops. Abdomen:  Soft, nontender and nondistended. Normal bowel sounds.   Neuro/Psych:  Normal mood and affect. A and O  x 3   Wetzel Meester E. Tomasa Rand, MD [facility]

## 2023-12-29 NOTE — Progress Notes (Signed)
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 Taking Valtrex. Reports it 90% resolved.

## 2023-12-30 ENCOUNTER — Telehealth: Payer: Self-pay

## 2023-12-30 NOTE — Telephone Encounter (Signed)
LEFT MESSAGE ON ANSWERING MACHINE

## 2024-01-02 DIAGNOSIS — B0233 Zoster keratitis: Secondary | ICD-10-CM | POA: Diagnosis not present

## 2024-01-03 LAB — SURGICAL PATHOLOGY

## 2024-01-04 ENCOUNTER — Encounter: Payer: Self-pay | Admitting: Gastroenterology

## 2024-01-04 NOTE — Progress Notes (Signed)
[name] [name],   All of the polyps that I removed during your recent procedure were completely benign but were proven to be "pre-cancerous" polyps that MAY have grown into cancers if they had not been removed.  Studies shows that at least 20% of women over age 74 and 30% of men over age 66 have pre-cancerous polyps.  Based on current nationally recognized surveillance guidelines, I recommend that you have a repeat colonoscopy in 3 years.   If you develop any new rectal bleeding, abdominal pain or significant bowel habit changes, please contact me before then.

## 2024-01-18 DIAGNOSIS — B0233 Zoster keratitis: Secondary | ICD-10-CM | POA: Diagnosis not present

## 2024-01-18 DIAGNOSIS — H40053 Ocular hypertension, bilateral: Secondary | ICD-10-CM | POA: Diagnosis not present

## 2024-01-26 ENCOUNTER — Other Ambulatory Visit: Payer: Self-pay | Admitting: Internal Medicine

## 2024-01-26 NOTE — Telephone Encounter (Signed)
Name of Medication: Hydrocodone Name of Pharmacy: [facility] or Written Date and Quantity: 12-06-23 #60 Last Office Visit and Type: 10-20-23 Next Office Visit and Type: 04-20-24 Last Controlled Substance Agreement Date: 10-18-22 Last UDS: 10-18-22

## 2024-01-27 ENCOUNTER — Other Ambulatory Visit: Payer: Self-pay | Admitting: Internal Medicine

## 2024-02-27 ENCOUNTER — Other Ambulatory Visit: Payer: Self-pay | Admitting: Internal Medicine

## 2024-03-05 DIAGNOSIS — H40053 Ocular hypertension, bilateral: Secondary | ICD-10-CM | POA: Diagnosis not present

## 2024-03-05 DIAGNOSIS — B0233 Zoster keratitis: Secondary | ICD-10-CM | POA: Diagnosis not present

## 2024-03-05 DIAGNOSIS — E113291 Type 2 diabetes mellitus with mild nonproliferative diabetic retinopathy without macular edema, right eye: Secondary | ICD-10-CM | POA: Diagnosis not present

## 2024-03-05 DIAGNOSIS — E113212 Type 2 diabetes mellitus with mild nonproliferative diabetic retinopathy with macular edema, left eye: Secondary | ICD-10-CM | POA: Diagnosis not present

## 2024-03-13 ENCOUNTER — Other Ambulatory Visit: Payer: Self-pay | Admitting: Internal Medicine

## 2024-03-13 NOTE — Telephone Encounter (Signed)
 Name of Medication: Hydrocodone Name of Pharmacy: [facility] or Written Date and Quantity: 01-26-24 #60 Last Office Visit and Type: 10-20-23 Next Office Visit and Type: 04-20-24 Last Controlled Substance Agreement Date: 10-18-22 Last UDS: 10-18-22

## 2024-04-19 ENCOUNTER — Ambulatory Visit (INDEPENDENT_AMBULATORY_CARE_PROVIDER_SITE_OTHER): Admitting: Internal Medicine

## 2024-04-19 VITALS — BP 130/80 | HR 75 | Temp 98.0°F | Ht 64.25 in | Wt 188.0 lb

## 2024-04-19 DIAGNOSIS — T466X5A Adverse effect of antihyperlipidemic and antiarteriosclerotic drugs, initial encounter: Secondary | ICD-10-CM | POA: Diagnosis not present

## 2024-04-19 DIAGNOSIS — F1120 Opioid dependence, uncomplicated: Secondary | ICD-10-CM | POA: Diagnosis not present

## 2024-04-19 DIAGNOSIS — I25118 Atherosclerotic heart disease of native coronary artery with other forms of angina pectoris: Secondary | ICD-10-CM

## 2024-04-19 DIAGNOSIS — Z7984 Long term (current) use of oral hypoglycemic drugs: Secondary | ICD-10-CM

## 2024-04-19 DIAGNOSIS — G720 Drug-induced myopathy: Secondary | ICD-10-CM | POA: Diagnosis not present

## 2024-04-19 DIAGNOSIS — M353 Polymyalgia rheumatica: Secondary | ICD-10-CM | POA: Diagnosis not present

## 2024-04-19 DIAGNOSIS — E1159 Type 2 diabetes mellitus with other circulatory complications: Secondary | ICD-10-CM | POA: Diagnosis not present

## 2024-04-19 DIAGNOSIS — G894 Chronic pain syndrome: Secondary | ICD-10-CM

## 2024-04-19 DIAGNOSIS — Z0000 Encounter for general adult medical examination without abnormal findings: Secondary | ICD-10-CM

## 2024-04-19 LAB — HM DIABETES FOOT EXAM

## 2024-04-19 MED ORDER — TAMSULOSIN HCL 0.4 MG PO CAPS: 0.4000 mg | ORAL_CAPSULE | Freq: Every day | ORAL | 3 refills | Status: AC

## 2024-04-19 NOTE — Assessment & Plan Note (Signed)
 Seems to have acceptable control Invokana  300mg  daily glipizide  5 daily, metformin  1000 bid and trulicity  3mg  weekly

## 2024-04-19 NOTE — Progress Notes (Signed)
 Subjective:    Patient ID: [name] [name], male    DOB: 1951/03/04, 73 y.o.   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 No alcohol or tobacco One recent fall--mild injury No depression or anhedonia of note---aggravation at times Independent with instrumental ADLs Mild memory issues--no functional changes  Fell recently--tripped on piece of wood Pain in left leg--aching at night  Back pain is about the same--usually once a day for hydrocodone  unless long time on the tractor (might need second one)  Checks sugars regularly Usually 140's midday--fasting No foot numbness, burning or tingling No low sugar reactions---just occ dizziness if he moves too fast  No recent chest pain No SOB/DOE No syncope---rare lightheadedness if he turns too fast Occasional foot swelling--does take the furosemide  if persists No palpitations  Still taking the 5mg  prednisone  Had flare of myalgias---and hard to get around (?from more time on tractor) Wasn't better with doubling it  Current Outpatient Medications on File Prior to Visit  Medication Sig Dispense Refill   Accu-Chek FastClix Lancets MISC Use to obtain blood sugar sample once daily. Dx code E11.8 102 each 4   albuterol  (VENTOLIN  HFA) 108 (90 Base) MCG/ACT inhaler Inhale 2 puffs into the lungs every 6 (six) hours as needed for wheezing or shortness of breath. 18 g 12   Ascorbic Acid (VITAMIN C) 1000 MG tablet Take 1,000 mg by mouth daily.      aspirin  EC 81 MG tablet Take 1 tablet (81 mg total) by mouth daily. 90 tablet 3   clopidogrel  (PLAVIX ) 75 MG tablet Take 1 tablet (75 mg total) by mouth daily with breakfast. Please call [phone] for an appointment for further refills. 90 tablet 3   Evolocumab  (REPATHA  SURECLICK) 140 MG/ML SOAJ Inject 140 mg into the skin every 14 (fourteen) days. 6 mL 3   furosemide  (LASIX ) 40 MG tablet Take 1 tablet (40 mg total) by mouth daily as needed. 90 tablet 3   glipiZIDE  (GLUCOTROL  XL) 5 MG 24 hr tablet TAKE ONE TABLET (5 MG TOTAL) BY MOUTH DAILY WITH BREAKFAST. 30 tablet 11   glucose blood (ACCU-CHEK GUIDE) test strip Use to check blood sugar once daily Dx code E11.8 100 each 4   HYDROcodone -acetaminophen  (NORCO/VICODIN) 5-325 MG tablet TAKE ONE TABLET BY MOUTH TWICE A DAY 60 tablet 0   INVOKANA  300 MG TABS tablet TAKE ONE TABLET BY MOUTH ONCE A DAY BEFORE BREAKFAST 90 tablet 3   LORazepam  (ATIVAN ) 0.5 MG tablet Take 0.5-1 tablets (0.25-0.5 mg total) by mouth 2 (two) times daily as needed for anxiety. 30 tablet 0   metFORMIN  (GLUCOPHAGE ) 1000 MG tablet TAKE ONE TABLET BY MOUTH TWICE A DAY WITH A MEAL 180 tablet 3   nitroGLYCERIN  (NITROSTAT ) 0.4 MG SL tablet Place 1 tablet (0.4 mg total) under the tongue every 5 (five) minutes as needed for chest pain. 25 tablet 3   potassium chloride  SA (KLOR-CON  M) 20 MEQ tablet Take 1 tablet (20 mEq total) by mouth 2 (two) times daily as needed (Take with lasix ). 90 tablet 3   predniSONE  (DELTASONE ) 5 MG tablet Take 2 tablets (10 mg total) by  mouth daily with breakfast. 180 tablet 1   tamsulosin  (FLOMAX ) 0.4 MG CAPS capsule TAKE ONE CAPSULE BY MOUTH ONCE DAILY 90 capsule 3   triamcinolone  cream (KENALOG ) 0.1 % Apply 1 Application topically 2 (two) times daily as needed. 30 g 0   TRULICITY  3 MG/0.5ML SOAJ INJECT THREE MG AS DIRECTED ONCE WEEKLY 2 mL 11   No current facility-administered medications on file prior to visit.    Allergies  Allergen Reactions    Doxazosin Mesylate Other (See Comments)    REACTION: didn't work and may have caused SOB   Carvedilol  Other (See Comments)    Felt bad   Crestor  [Rosuvastatin ] Other (See Comments)    Muscle aches.   Doxycycline Other (See Comments) and Rash    REACTION: unspecified   Glimepiride Other (See Comments)    REACTION: red eyes   Glipizide  Other (See Comments)    REACTION: myalgia??   Losartan Potassium-Hctz Other (See Comments)    REACTION: chest \\T \ leg pain   Metoprolol  Tartrate Other (See Comments)    Mood swings, memory changes   Pravastatin  Other (See Comments)    Muscle aches    Rosiglitazone Maleate Other (See Comments)    REACTION: myalgia   Simvastatin  Other (See Comments)    REACTION: myalgias   Tetracycline Rash   Tetracyclines & Related Rash    Past Medical History:  Diagnosis Date   Angina    Arthritis    BPH (benign prostatic hypertrophy)    Carpal tunnel syndrome    Coronary artery disease    a. 11/2011 s/p CABG x 3  (LIMA-LAD, SeqSVG-D1-D3); b. 09/2019 CATH-RCA PCI: LM 50ost, LAD 100ost/p, Sev diff diag dzs, LCX nl, OM1 40m, OM3 40, RCA 70ost (iFR 0.88--> 3.0x26 Resolute Onyx DES), 77m, LIMA->LAD ok. VG->D1->D3 ok. EF 50-55%.; c) 10/01/2019: staged PCI Ost LM (Resolute Onyx 3.5 x 12 - 4.2 mm) & Ost OM1 (Resolute Onyx 2.25 x 26 -- 2.5 mm)   GERD (gastroesophageal reflux disease)    Hx of colonic polyps    Hyperlipidemia    Hypertension    Kidney stones    Morbid obesity (HCC)    OSA (obstructive sleep apnea)    uses cpap   Type II diabetes mellitus (HCC)     Past Surgical History:  Procedure Laterality Date   CARDIAC CATHETERIZATION     CORONARY ARTERY BYPASS GRAFT  11/12/2011   Procedure: CORONARY ARTERY BYPASS GRAFTING (CABG);  Surgeon: Norita Beauvais, MD;  Location: [facility] OR;  Service: Open Heart Surgery;  Laterality: N/A;  Coronary Artery Bypass Graft times three on pump utilizing left internal mammary artery and right saphenous vein harvested  endoscopically    CORONARY PRESSURE/FFR STUDY N/A 09/27/2019   Procedure: INTRAVASCULAR PRESSURE WIRE/FFR STUDY;  Surgeon: Wenona Hamilton, MD;  Location: [facility] INVASIVE CV LAB;  Service: Cardiovascular;  Laterality: N/A;   CORONARY STENT INTERVENTION N/A 09/27/2019   Procedure: CORONARY STENT INTERVENTION;  Surgeon: Wenona Hamilton, MD;  Location: [facility];  Service: Cardiovascular;  Laterality: N/A;   CORONARY STENT INTERVENTION N/A 10/01/2019   Procedure: CORONARY STENT INTERVENTION;  Surgeon: Sammy Crisp, MD;  Location: [facility];  Service: Cardiovascular;  Laterality: N/A;   CORONARY ULTRASOUND/IVUS N/A 10/01/2019   Procedure: Intravascular Ultrasound/IVUS;  Surgeon: Sammy Crisp, MD;  Location: [facility];  Service: Cardiovascular;  Laterality: N/A;   HERNIA REPAIR     KNEE ARTHROPLASTY Right 11/23/2017   Procedure: COMPUTER ASSISTED TOTAL KNEE  ARTHROPLASTY;  Surgeon: Arlyne Lame, MD;  Location: [facility] ORS;  Service: Orthopedics;  Laterality: Right;   KNEE SURGERY     left   LEFT HEART CATH AND CORONARY ANGIOGRAPHY Left 09/27/2019   Procedure: LEFT HEART CATH AND CORONARY ANGIOGRAPHY;  Surgeon: Devorah Fonder, MD;  Location: [facility];  Service: Cardiovascular;  Laterality: Left;   LEFT HEART CATH AND CORONARY ANGIOGRAPHY N/A 10/01/2019   Procedure: LEFT HEART CATH AND CORONARY ANGIOGRAPHY;  Surgeon: Sammy Crisp, MD;  Location: [facility];  Service: Cardiovascular;  Laterality: N/A;   MENISCUS REPAIR  03/08   right knee    Family History  Problem Relation Age of Onset   Coronary artery disease Father    Prostate cancer Father    Crohn's disease Paternal Uncle    Diabetes Neg Hx    Hypertension Neg Hx    Colon cancer Neg Hx    Esophageal cancer Neg Hx    Stomach cancer Neg Hx    Rectal cancer Neg Hx     Social History   Socioeconomic History   Marital status: Married    Spouse name: Not on file   Number  of children: 3   Years of education: Not on file   Highest education level: Associate degree: occupational, Scientist, product/process development, or vocational program  Occupational History   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   Occupation: Biochemist, clinical &/or corn farming   Occupation: retired  Tobacco Use   Smoking status: Never    Passive exposure: Never   Smokeless tobacco: Never  Vaping Use   Vaping status: Never Used  Substance and Sexual Activity   Alcohol use: No   Drug use: No   Sexual activity: Yes  Other Topics Concern   Not on file  Social History Narrative   No living will   Requests wife as health care POA--no clear alternate   Would accept resuscitation   Would accept tube feedings--not sure about duration   Social Drivers of Corporate investment banker Strain: Low Risk  (04/19/2024)   Overall Financial Resource Strain (CARDIA)    Difficulty of Paying Living Expenses: Not very hard  Food Insecurity: No Food Insecurity (04/19/2024)   Hunger Vital Sign    Worried About Running Out of Food in the Last Year: Never true    Ran Out of Food in the Last Year: Never true  Transportation Needs: No Transportation Needs (04/19/2024)   PRAPARE - Administrator, Civil Service (Medical): No    Lack of Transportation (Non-Medical): No  Physical Activity: Sufficiently Active (04/19/2024)   Exercise Vital Sign    Days of Exercise per Week: 5 days    Minutes of Exercise per Session: 30 min  Stress: No Stress Concern Present (04/19/2024)   [employer] of Occupational Health - Occupational Stress Questionnaire    Feeling of Stress : Only a little  Social Connections: Unknown (04/19/2024)   Social Connection and Isolation Panel [NHANES]    Frequency of Communication with Friends and Family: Once a week    Frequency of Social Gatherings with Friends and Family: Once a week    Attends Religious Services: Patient declined    Database administrator or Organizations: No    Attends Museum/gallery exhibitions officer: Not on file    Marital Status: Married  Catering manager Violence: Not on file   Review of Systems Appetite is okay Weight up slightly Sleeps okay mostly.  No PND--no orthopnea Wears seat belt inconsistently New dentures---still having trouble with them Has spot on right ear that has recurred---easy bruising No heartburn or dysphagia Some trouble with hair loss Bowels move okay--no blood Voids okay--reasonable stream and empties okay     Objective:   Physical Exam Constitutional:      Appearance: Normal appearance.  HENT:     Mouth/Throat:     Pharynx: No oropharyngeal exudate or posterior oropharyngeal erythema.  Eyes:     Conjunctiva/sclera: Conjunctivae normal.     Pupils: Pupils are equal, round, and reactive to light.  Cardiovascular:     Rate and Rhythm: Normal rate and regular rhythm.     Pulses: Normal pulses.     Heart sounds: No murmur heard.    No gallop.  Pulmonary:     Effort: Pulmonary effort is normal.     Breath sounds: Normal breath sounds. No wheezing or rales.  Abdominal:     Palpations: Abdomen is soft.     Tenderness: There is no abdominal tenderness.  Musculoskeletal:     Cervical back: Neck supple.     Right lower leg: No edema.     Left lower leg: No edema.  Lymphadenopathy:     Cervical: No cervical adenopathy.  Skin:    Findings: No lesion or rash.     Comments: No foot lesions Tender ?cysts along inner portion of upper right helix Scattered ecchymoses especially on arms  Neurological:     General: No focal deficit present.     Mental Status: He is alert and oriented to person, place, and time.     Comments: Normal sensation in feet Mini-cog---okay. Clock good, recall 2/3  Psychiatric:        Mood and Affect: Mood normal.        Behavior: Behavior normal.            Assessment & Plan:

## 2024-04-19 NOTE — Assessment & Plan Note (Signed)
 PDMP reviewed No concerns

## 2024-04-19 NOTE — Assessment & Plan Note (Signed)
 No recent angina on ASA/plavix , invokana , repatha  Hasn't needed the nitro

## 2024-04-19 NOTE — Assessment & Plan Note (Signed)
 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

## 2024-04-19 NOTE — Assessment & Plan Note (Signed)
 Has not been able to wean down from 10mg  daily Will check sed rate again

## 2024-04-19 NOTE — Assessment & Plan Note (Signed)
 I have personally reviewed the Medicare Annual Wellness questionnaire and have noted 1. The patient's medical and social history 2. Their use of alcohol, tobacco or illicit drugs 3. Their current medications and supplements 4. The patient's functional ability including ADL's, fall risks, home safety risks and hearing or visual             impairment. 5. Diet and physical activities 6. Evidence for depression or mood disorders  The patients weight, height, BMI and visual acuity have been recorded in the chart I have made referrals, counseling and provided education to the patient based review of the above and I have provided the pt with a written personalized care plan for preventive services.  I have provided you with a copy of your personalized plan for preventive services. Please take the time to review along with your updated medication list.  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

## 2024-04-19 NOTE — Assessment & Plan Note (Signed)
 On the repatha 

## 2024-04-20 ENCOUNTER — Encounter: Payer: Medicare HMO | Admitting: Internal Medicine

## 2024-04-20 LAB — LIPID PANEL
Cholesterol: 130 mg/dL (ref 0–200)
HDL: 49.2 mg/dL (ref >39.00)
LDL Cholesterol: 51 mg/dL (ref 0–99)
NonHDL: 80.85
Total CHOL/HDL Ratio: 3
Triglycerides: 147 mg/dL (ref 0.0–149.0)
VLDL: 29.4 mg/dL (ref 0.0–40.0)

## 2024-04-20 LAB — HEPATIC FUNCTION PANEL
ALT: 17 U/L (ref 0–53)
AST: 12 U/L (ref 0–37)
Albumin: 4.2 g/dL (ref 3.5–5.2)
Alkaline Phosphatase: 79 U/L (ref 39–117)
Bilirubin, Direct: 0.1 mg/dL (ref 0.0–0.3)
Total Bilirubin: 0.6 mg/dL (ref 0.2–1.2)
Total Protein: 6.3 g/dL (ref 6.0–8.3)

## 2024-04-20 LAB — HEMOGLOBIN A1C: Hgb A1c MFr Bld: 7.6 % — ABNORMAL HIGH (ref 4.6–6.5)

## 2024-04-20 LAB — CBC
HCT: 45.6 % (ref 39.0–52.0)
Hemoglobin: 14.9 g/dL (ref 13.0–17.0)
MCHC: 32.8 g/dL (ref 30.0–36.0)
MCV: 87.1 fl (ref 78.0–100.0)
Platelets: 250 10*3/uL (ref 150.0–400.0)
RBC: 5.24 Mil/uL (ref 4.22–5.81)
RDW: 14.8 % (ref 11.5–15.5)
WBC: 8.9 10*3/uL (ref 4.0–10.5)

## 2024-04-20 LAB — SEDIMENTATION RATE: Sed Rate: 9 mm/h (ref 0–20)

## 2024-04-20 LAB — RENAL FUNCTION PANEL
Albumin: 4.2 g/dL (ref 3.5–5.2)
BUN: 17 mg/dL (ref 6–23)
CO2: 29 meq/L (ref 19–32)
Calcium: 9.1 mg/dL (ref 8.4–10.5)
Chloride: 102 meq/L (ref 96–112)
Creatinine, Ser: 0.98 mg/dL (ref 0.40–1.50)
GFR: 76.49 mL/min (ref >60.00)
Glucose, Bld: 182 mg/dL — ABNORMAL HIGH (ref 70–99)
Phosphorus: 2.9 mg/dL (ref 2.3–4.6)
Potassium: 3.8 meq/L (ref 3.5–5.1)
Sodium: 140 meq/L (ref 135–145)

## 2024-04-20 LAB — MICROALBUMIN / CREATININE URINE RATIO
Creatinine,U: 89.7 mg/dL
Microalb Creat Ratio: 58.8 mg/g — ABNORMAL HIGH (ref 0.0–30.0)
Microalb, Ur: 5.3 mg/dL — ABNORMAL HIGH (ref 0.0–1.9)

## 2024-04-22 ENCOUNTER — Ambulatory Visit: Payer: Self-pay | Admitting: Internal Medicine

## 2024-04-24 ENCOUNTER — Other Ambulatory Visit: Payer: Self-pay

## 2024-04-24 MED ORDER — LOSARTAN POTASSIUM 25 MG PO TABS: 25.0000 mg | ORAL_TABLET | Freq: Every day | ORAL | 0 refills | Status: DC

## 2024-05-01 ENCOUNTER — Other Ambulatory Visit: Payer: Self-pay | Admitting: Internal Medicine

## 2024-05-01 NOTE — Telephone Encounter (Signed)
 Name of Medication: Hydrocodone  Name of Pharmacy: [facility] or Written Date and Quantity: 03-13-24 #60 Last Office Visit and Type: 04-19-24 Next Office Visit and Type: 07-23-24 Last Controlled Substance Agreement Date: 10-18-22 Last UDS: 10-18-22

## 2024-06-04 ENCOUNTER — Other Ambulatory Visit: Payer: Self-pay | Admitting: Cardiovascular Disease

## 2024-06-14 ENCOUNTER — Other Ambulatory Visit: Payer: Self-pay | Admitting: Internal Medicine

## 2024-06-25 ENCOUNTER — Other Ambulatory Visit: Payer: Self-pay | Admitting: Cardiovascular Disease

## 2024-06-25 ENCOUNTER — Other Ambulatory Visit: Payer: Self-pay | Admitting: Nurse Practitioner

## 2024-07-23 ENCOUNTER — Ambulatory Visit (INDEPENDENT_AMBULATORY_CARE_PROVIDER_SITE_OTHER): Admitting: Internal Medicine

## 2024-07-23 ENCOUNTER — Ambulatory Visit: Admitting: Internal Medicine

## 2024-07-23 ENCOUNTER — Encounter: Payer: Self-pay | Admitting: Internal Medicine

## 2024-07-23 ENCOUNTER — Ambulatory Visit: Payer: Self-pay | Admitting: Internal Medicine

## 2024-07-23 VITALS — BP 120/70 | HR 80 | Temp 97.9°F | Ht 64.25 in | Wt 192.0 lb

## 2024-07-23 DIAGNOSIS — I10 Essential (primary) hypertension: Secondary | ICD-10-CM

## 2024-07-23 DIAGNOSIS — F1120 Opioid dependence, uncomplicated: Secondary | ICD-10-CM

## 2024-07-23 DIAGNOSIS — Z7985 Long-term (current) use of injectable non-insulin antidiabetic drugs: Secondary | ICD-10-CM | POA: Diagnosis not present

## 2024-07-23 DIAGNOSIS — E1159 Type 2 diabetes mellitus with other circulatory complications: Secondary | ICD-10-CM | POA: Diagnosis not present

## 2024-07-23 DIAGNOSIS — G894 Chronic pain syndrome: Secondary | ICD-10-CM | POA: Diagnosis not present

## 2024-07-23 DIAGNOSIS — M353 Polymyalgia rheumatica: Secondary | ICD-10-CM | POA: Diagnosis not present

## 2024-07-23 DIAGNOSIS — Z7984 Long term (current) use of oral hypoglycemic drugs: Secondary | ICD-10-CM | POA: Diagnosis not present

## 2024-07-23 DIAGNOSIS — E119 Type 2 diabetes mellitus without complications: Secondary | ICD-10-CM

## 2024-07-23 LAB — POCT GLYCOSYLATED HEMOGLOBIN (HGB A1C): Hemoglobin A1C: 7.7 % — AB (ref 4.0–5.6)

## 2024-07-23 NOTE — Addendum Note (Signed)
 Addended by: KALLIE CLOTILDA SQUIBB on: 07/23/2024 03:32 PM   Modules accepted: Orders

## 2024-07-23 NOTE — Assessment & Plan Note (Signed)
 PDMP reviewed No concerns

## 2024-07-23 NOTE — Assessment & Plan Note (Signed)
 A1c stable at 7.7% On invokana  300mg  daily, trulicity  3mg  weekly, glipizide  5 daily, metformin  1000 bid

## 2024-07-23 NOTE — Assessment & Plan Note (Signed)
 BP Readings from Last 3 Encounters:  07/23/24 120/70  04/19/24 130/80  12/29/23 139/67   Controlled on the losartan  25

## 2024-07-23 NOTE — Assessment & Plan Note (Signed)
 Stays functional with the hydrocodone  twice a day

## 2024-07-23 NOTE — Assessment & Plan Note (Signed)
 Still on 10mg  daily Discussed reducing by 5mg  a week over time

## 2024-07-23 NOTE — Progress Notes (Signed)
 Subjective:    Patient ID: [name] [name], male    DOB: 1951/10/04, 73 y.o.   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 Dx code E11.8 102 each 4   albuterol  (VENTOLIN  HFA) 108 (90 Base) MCG/ACT inhaler Inhale 2 puffs into the lungs every 6 (six) hours as needed for wheezing or shortness of breath. 18 g 12   Ascorbic Acid (VITAMIN C) 1000 MG tablet Take 1,000 mg by mouth daily.     aspirin  EC 81 MG tablet Take 1 tablet (81 mg total) by mouth daily. 90 tablet 3   clopidogrel  (PLAVIX ) 75 MG tablet Take 1 tablet (75 mg total) by mouth daily. 90 tablet 0   Evolocumab  (REPATHA  SURECLICK) 140 MG/ML SOAJ INJECT 140 MG INTO THE SKIN EVERY 14 DAYS 6 mL 1   furosemide  (LASIX ) 40 MG tablet Take 1 tablet (40 mg total) by mouth daily as needed. 90 tablet 3   glipiZIDE  (GLUCOTROL  XL) 5 MG 24 hr tablet TAKE ONE TABLET (5 MG TOTAL) BY MOUTH DAILY WITH BREAKFAST. 30 tablet 11   glucose blood (ACCU-CHEK GUIDE) test strip Use to check blood sugar once daily Dx code E11.8 100 each 4   HYDROcodone -acetaminophen  (NORCO/VICODIN) 5-325 MG tablet TAKE ONE TABLET BY MOUTH TWICE A DAY 60 tablet 0   INVOKANA  300 MG TABS tablet TAKE ONE TABLET BY MOUTH ONCE A DAY BEFORE BREAKFAST 90 tablet 3   LORazepam  (ATIVAN ) 0.5 MG tablet Take 0.5-1 tablets (0.25-0.5  mg total) by mouth 2 (two) times daily as needed for anxiety. 30 tablet 0   losartan  (COZAAR ) 25 MG tablet TAKE ONE TABLET (25 MG TOTAL) BY MOUTH DAILY. 90 tablet 3   metFORMIN  (GLUCOPHAGE ) 1000 MG tablet TAKE ONE TABLET BY MOUTH TWICE A DAY WITH A MEAL 180 tablet 3   nitroGLYCERIN  (NITROSTAT ) 0.4 MG SL tablet Place 1 tablet (0.4 mg total) under the tongue every 5 (five) minutes as needed for chest pain. 25 tablet 3   potassium chloride  SA (KLOR-CON  M) 20 MEQ tablet Take 1 tablet (20 mEq total) by mouth 2 (two) times daily as needed (Take with lasix ). 90 tablet 3   predniSONE  (DELTASONE ) 5 MG tablet Take 2 tablets (10 mg total) by mouth daily with breakfast. 180 tablet 1   tamsulosin  (FLOMAX ) 0.4 MG CAPS capsule Take 1 capsule (0.4 mg total) by mouth daily. 90 capsule 3   triamcinolone  cream (KENALOG ) 0.1 % Apply 1 Application topically 2 (two) times daily as needed. 30 g 0   TRULICITY  3 MG/0.5ML SOAJ INJECT THREE MG AS DIRECTED ONCE WEEKLY 2 mL 11   No current facility-administered medications on file prior to visit.    Allergies  Allergen Reactions   Doxazosin Mesylate Other (See Comments)    REACTION: didn't work and may have caused SOB   Carvedilol  Other (  See Comments)    Felt bad   Crestor  [Rosuvastatin ] Other (See Comments)    Muscle aches.   Doxycycline Other (See Comments) and Rash    REACTION: unspecified   Glimepiride Other (See Comments)    REACTION: red eyes   Glipizide  Other (See Comments)    REACTION: myalgia??   Losartan  Potassium-Hctz Other (See Comments)    REACTION: chest \\T \ leg pain   Metoprolol  Tartrate Other (See Comments)    Mood swings, memory changes   Pravastatin  Other (See Comments)    Muscle aches    Rosiglitazone Maleate Other (See Comments)    REACTION: myalgia   Simvastatin  Other (See Comments)    REACTION: myalgias   Tetracycline Rash   Tetracyclines & Related Rash    Past Medical History:  Diagnosis Date   Angina    Arthritis    BPH  (benign prostatic hypertrophy)    Carpal tunnel syndrome    Coronary artery disease    a. 11/2011 s/p CABG x 3  (LIMA-LAD, SeqSVG-D1-D3); b. 09/2019 CATH-RCA PCI: LM 50ost, LAD 100ost/p, Sev diff diag dzs, LCX nl, OM1 28m, OM3 40, RCA 70ost (iFR 0.88--> 3.0x26 Resolute Onyx DES), 82m, LIMA->LAD ok. VG->D1->D3 ok. EF 50-55%.; c) 10/01/2019: staged PCI Ost LM (Resolute Onyx 3.5 x 12 - 4.2 mm) & Ost OM1 (Resolute Onyx 2.25 x 26 -- 2.5 mm)   GERD (gastroesophageal reflux disease)    Hx of colonic polyps    Hyperlipidemia    Hypertension    Kidney stones    Morbid obesity (HCC)    OSA (obstructive sleep apnea)    uses cpap   Type II diabetes mellitus (HCC)     Past Surgical History:  Procedure Laterality Date   CARDIAC CATHETERIZATION     CORONARY ARTERY BYPASS GRAFT  11/12/2011   Procedure: CORONARY ARTERY BYPASS GRAFTING (CABG);  Surgeon: Dallas KATHEE Jude, MD;  Location: [facility] OR;  Service: Open Heart Surgery;  Laterality: N/A;  Coronary Artery Bypass Graft times three on pump utilizing left internal mammary artery and right saphenous vein harvested endoscopically    CORONARY PRESSURE/FFR STUDY N/A 09/27/2019   Procedure: INTRAVASCULAR PRESSURE WIRE/FFR STUDY;  Surgeon: Darron Deatrice LABOR, MD;  Location: [facility];  Service: Cardiovascular;  Laterality: N/A;   CORONARY STENT INTERVENTION N/A 09/27/2019   Procedure: CORONARY STENT INTERVENTION;  Surgeon: Darron Deatrice LABOR, MD;  Location: [facility];  Service: Cardiovascular;  Laterality: N/A;   CORONARY STENT INTERVENTION N/A 10/01/2019   Procedure: CORONARY STENT INTERVENTION;  Surgeon: Mady Bruckner, MD;  Location: [facility];  Service: Cardiovascular;  Laterality: N/A;   CORONARY ULTRASOUND/IVUS N/A 10/01/2019   Procedure: Intravascular Ultrasound/IVUS;  Surgeon: Mady Bruckner, MD;  Location: [facility];  Service: Cardiovascular;  Laterality: N/A;   HERNIA REPAIR     KNEE ARTHROPLASTY Right  11/23/2017   Procedure: COMPUTER ASSISTED TOTAL KNEE ARTHROPLASTY;  Surgeon: Mardee Lynwood SQUIBB, MD;  Location: [facility] ORS;  Service: Orthopedics;  Laterality: Right;   KNEE SURGERY     left   LEFT HEART CATH AND CORONARY ANGIOGRAPHY Left 09/27/2019   Procedure: LEFT HEART CATH AND CORONARY ANGIOGRAPHY;  Surgeon: Perla Evalene PARAS, MD;  Location: [facility];  Service: Cardiovascular;  Laterality: Left;   LEFT HEART CATH AND CORONARY ANGIOGRAPHY N/A 10/01/2019   Procedure: LEFT HEART CATH AND CORONARY ANGIOGRAPHY;  Surgeon: Mady Bruckner, MD;  Location: [facility];  Service: Cardiovascular;  Laterality: N/A;  MENISCUS REPAIR  03/08   right knee    Family History  Problem Relation Age of Onset   Coronary artery disease Father    Prostate cancer Father    Crohn's disease Paternal Uncle    Diabetes Neg Hx    Hypertension Neg Hx    Colon cancer Neg Hx    Esophageal cancer Neg Hx    Stomach cancer Neg Hx    Rectal cancer Neg Hx     Social History   Socioeconomic History   Marital status: Married    Spouse name: Not on file   Number of children: 3   Years of education: Not on file   Highest education level: Associate degree: occupational, Scientist, product/process development, or vocational program  Occupational History   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   Occupation: Biochemist, clinical &/or corn farming   Occupation: retired  Tobacco Use   Smoking status: Never    Passive exposure: Never   Smokeless tobacco: Never  Vaping Use   Vaping status: Never Used  Substance and Sexual Activity   Alcohol use: No   Drug use: No   Sexual activity: Yes  Other Topics Concern   Not on file  Social History Narrative   No living will   Requests wife as health care POA--no clear alternate   Would accept resuscitation   Would accept tube feedings--not sure about duration   Social Drivers of Corporate investment banker Strain: Low Risk  (04/19/2024)   Overall Financial Resource Strain (CARDIA)     Difficulty of Paying Living Expenses: Not very hard  Food Insecurity: No Food Insecurity (04/19/2024)   Hunger Vital Sign    Worried About Running Out of Food in the Last Year: Never true    Ran Out of Food in the Last Year: Never true  Transportation Needs: No Transportation Needs (04/19/2024)   PRAPARE - Administrator, Civil Service (Medical): No    Lack of Transportation (Non-Medical): No  Physical Activity: Sufficiently Active (04/19/2024)   Exercise Vital Sign    Days of Exercise per Week: 5 days    Minutes of Exercise per Session: 30 min  Stress: No Stress Concern Present (04/19/2024)   [employer] of Occupational Health - Occupational Stress Questionnaire    Feeling of Stress : Only a little  Social Connections: Unknown (04/19/2024)   Social Connection and Isolation Panel    Frequency of Communication with Friends and Family: Once a week    Frequency of Social Gatherings with Friends and Family: Once a week    Attends Religious Services: Patient declined    Database administrator or Organizations: No    Attends Engineer, structural: Not on file    Marital Status: Married  Catering manager Violence: Not on file   Review of Systems Sleeps okay Weight is stable      Objective:   Physical Exam Constitutional:      Appearance: Normal appearance.  Cardiovascular:     Rate and Rhythm: Normal rate and regular rhythm.     Pulses: Normal pulses.     Heart sounds: No murmur heard.    No gallop.  Pulmonary:     Effort: Pulmonary effort is normal.     Breath sounds: Normal breath sounds. No wheezing or rales.  Musculoskeletal:     Cervical back: Neck supple.     Right lower leg: No edema.     Left lower leg: No edema.  Lymphadenopathy:     Cervical: No cervical adenopathy.  Skin:    Comments: No foot lesions  Neurological:     Mental Status: He is alert.            Assessment & Plan:

## 2024-08-16 ENCOUNTER — Other Ambulatory Visit: Payer: Self-pay | Admitting: *Deleted

## 2024-08-16 MED ORDER — HYDROCODONE-ACETAMINOPHEN 5-325 MG PO TABS: 1.0000 | ORAL_TABLET | Freq: Two times a day (BID) | ORAL | 0 refills | Status: DC

## 2024-08-16 NOTE — Telephone Encounter (Signed)
 Name of Medication: Norco 5-325mg  Name of Pharmacy: [facility] Last Fill or Written Date and Quantity: 06/27/24 #60 tab/ 0 refills  Last Office Visit and Type: 3 month f/u 07/23/24 Next Office Visit and Type: TOC 10/25/24 Last Controlled Substance Agreement Date: 09/11/2019 Last UDS: 10/18/2022

## 2024-08-31 ENCOUNTER — Other Ambulatory Visit: Payer: Self-pay

## 2024-08-31 MED ORDER — PREDNISONE 5 MG PO TABS: 10.0000 mg | ORAL_TABLET | Freq: Every day | ORAL | 0 refills | Status: DC

## 2024-08-31 NOTE — Telephone Encounter (Signed)
 Rx sent electronically.

## 2024-09-13 NOTE — Progress Notes (Unsigned)
 Cardiology Office Note  Date:  09/13/2024   ID:  [name] [name], DOB [birth year], MRN [id number]  PCP:  Jimmy Charlie FERNS, MD   No chief complaint on file.   HPI:  73 year-old gentleman with  obesity,  hyperlipidemia,  diabetes,  hypertension,  CAD, s/p CABG. severe LAD disease, bypass surgery November 12 2011. 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  Unintentional weight loss over the past year Weight down 16 in one year since his last clinic visit  On predisone 20 daily for polymyalgia, reports symptoms are improved but A1c going higher despite his best efforts and changes to his medications  Denies chest pain or shortness of breath on exertion  Exercise limited secondary to back pain  Labs reviewed HBA1C 9.7, trending upwards No recent lipid panel  Most recent total cholesterol 115 LDL 32 monthly Repatha   EKG personally reviewed by myself on todays visit nsr rate 69 bpm left bundle branch block, unchanged  Prior records reviewed Cath , Three-vessel coronary artery disease, including 60% ostial/proximal LMCA stenosis (MLA 5.4 mm^2 by IVUS), chronic total occlusion of the proximal LAD (known patent LIMA-LAD and SVG-D1 and D2), 90% OM1 stenosis, and 50% mid/distal RCA stenosis. Widely patent proximal RCA stent. Normal left ventricular filling pressure. Successful IVUS-guided PCI to the ostial through mid LMCA using Resolute Onyx 3.5 12 mm drug-eluting stent (postdilated to 4.2 mm) with 0% residual stenosis and TIMI-3 flow. Successful PCI to OM1 using Resolute Onyx 2.25 x  26 mm drug-eluting stent with 0% residual stenosis and TIMI-3 flow.  unable to tolerate a statin including Crestor , Lipitor , simvastatin ., zetia  Myalgias, inflammation   Medication intolerances  unable to tolerate metoprolol  secondary to memory problems and mood disorder. Unable to tolerate carvedilol  secondary to fatigue   Previous  Echocardiogram  for his shortness of breath showed normal function, no significant pulmonary hypertension. Normal right ventricular systolic pressure.  PMH:   has a past medical history of Angina, Arthritis, BPH (benign prostatic hypertrophy), Carpal tunnel syndrome, Coronary artery disease, GERD (gastroesophageal reflux disease), colonic polyps, Hyperlipidemia, Hypertension, Kidney stones, Morbid obesity (HCC), OSA (obstructive sleep apnea), and Type II diabetes mellitus (HCC).  PSH:    Past Surgical History:  Procedure Laterality Date   CARDIAC CATHETERIZATION     CORONARY ARTERY BYPASS GRAFT  11/12/2011   Procedure: CORONARY ARTERY BYPASS GRAFTING (CABG);  Surgeon: Dallas KATHEE Jude, MD;  Location: [facility] OR;  Service: Open Heart Surgery;  Laterality: N/A;  Coronary Artery Bypass Graft times three on pump utilizing left internal mammary artery and right saphenous vein harvested endoscopically    CORONARY PRESSURE/FFR STUDY N/A 09/27/2019   Procedure: INTRAVASCULAR PRESSURE WIRE/FFR STUDY;  Surgeon: Darron Deatrice LABOR, MD;  Location: [facility] INVASIVE CV LAB;  Service: Cardiovascular;  Laterality: N/A;   CORONARY STENT INTERVENTION N/A 09/27/2019   Procedure: CORONARY STENT INTERVENTION;  Surgeon: Darron Deatrice LABOR, MD;  Location: [facility] INVASIVE CV LAB;  Service: Cardiovascular;  Laterality: N/A;   CORONARY STENT INTERVENTION N/A 10/01/2019   Procedure: CORONARY STENT INTERVENTION;  Surgeon: Mady Bruckner, MD;  Location: [facility] INVAS[facility] CV LAB;  Service: Cardiovascular;  Laterality: N/A;   CORONARY ULTRASOUND/IVUS N/A 10/01/2019   Procedure: Intravascular  Ultrasound/IVUS;  Surgeon: Mady Bruckner, MD;  Location: [facility] CV LAB;  Service: Cardiovascular;  Laterality: N/A;   HERNIA REPAIR     KNEE ARTHROPLASTY Right 11/23/2017   Procedure: COMPUTER ASSISTED TOTAL KNEE ARTHROPLASTY;  Surgeon: Mardee Lynwood SQUIBB, MD;  Location: [facility] ORS;  Service: Orthopedics;  Laterality: Right;   KNEE SURGERY     left   LEFT HEART CATH AND CORONARY ANGIOGRAPHY Left 09/27/2019   Procedure: LEFT HEART CATH AND CORONARY ANGIOGRAPHY;  Surgeon: Perla Evalene PARAS, MD;  Location: [facility] INV[facility] CV LAB;  Service: Cardiovascular;  Laterality: Left;   LEFT HEART CATH AND CORONARY ANGIOGRAPHY N/A 10/01/2019   Procedure: LEFT HEART CATH AND CORONARY ANGIOGRAPHY;  Surgeon: Mady Bruckner, MD;  Location: [facility];  Service: Cardiovascular;  Laterality: N/A;   MENISCUS REPAIR  03/08   right knee    Current Outpatient Medications  Medication Sig Dispense Refill   Accu-Chek FastClix Lancets MISC Use to obtain blood sugar sample once daily. Dx code E11.8 102 each 4   albuterol  (VENTOLIN  HFA) 108 (90 Base) MCG/ACT inhaler Inhale 2 puffs into the lungs every 6 (six) hours as needed for wheezing or shortness of breath. 18 g 12   Ascorbic Acid (VITAMIN C) 1000 MG tablet Take 1,000 mg by mouth daily.     aspirin  EC 81 MG tablet Take 1 tablet (81 mg total) by mouth daily. 90 tablet 3   clopidogrel  (PLAVIX ) 75 MG tablet Take 1 tablet (75 mg total) by mouth daily. 90 tablet 0   Evolocumab  (REPATHA  SURECLICK) 140 MG/ML SOAJ INJECT 140 MG INTO THE SKIN EVERY 14 DAYS 6 mL 1   furosemide  (LASIX ) 40 MG tablet Take 1 tablet (40 mg total) by mouth daily as needed. 90 tablet 3   glipiZIDE  (GLUCOTROL  XL) 5 MG 24 hr tablet TAKE ONE TABLET (5 MG TOTAL) BY MOUTH DAILY WITH BREAKFAST. 30 tablet 11   glucose blood (ACCU-CHEK GUIDE) test strip Use to check blood sugar once daily Dx code E11.8 100 each 4   HYDROcodone -acetaminophen  (NORCO/VICODIN) 5-325 MG tablet Take 1 tablet by mouth 2  (two) times daily. 60 tablet 0   INVOKANA  300 MG TABS tablet TAKE ONE TABLET BY MOUTH ONCE A DAY BEFORE BREAKFAST 90 tablet 3   LORazepam  (ATIVAN ) 0.5 MG tablet Take 0.5-1 tablets (0.25-0.5 mg total) by mouth 2 (two) times daily as needed for anxiety. 30 tablet 0   losartan  (COZAAR ) 25 MG tablet TAKE ONE TABLET (25 MG TOTAL) BY MOUTH DAILY. 90 tablet 3   metFORMIN  (GLUCOPHAGE ) 1000 MG tablet TAKE ONE TABLET BY MOUTH TWICE A DAY WITH A MEAL 180 tablet 3   nitroGLYCERIN  (NITROSTAT ) 0.4 MG SL tablet Place 1 tablet (0.4 mg total) under the tongue every 5 (five) minutes as needed for chest pain. 25 tablet 3   potassium chloride  SA (KLOR-CON  M) 20 MEQ tablet Take 1 tablet (20 mEq total) by mouth 2 (two) times daily as needed (Take with lasix ). 90 tablet 3   predniSONE  (DELTASONE ) 5 MG tablet Take 2 tablets (10 mg total) by mouth daily with breakfast. 180 tablet 0   tamsulosin  (FLOMAX ) 0.4 MG CAPS capsule Take 1 capsule (0.4 mg total) by mouth daily. 90 capsule 3   triamcinolone  cream (KENALOG ) 0.1 % Apply 1 Application topically 2 (two) times daily as needed. 30 g 0   TRULICITY  3 MG/0.5ML SOAJ INJECT THREE MG AS DIRECTED ONCE  WEEKLY 2 mL 11   No current facility-administered medications for this visit.    Allergies:   Doxazosin mesylate, Carvedilol , Crestor  [rosuvastatin ], Doxycycline, Glimepiride, Glipizide , Losartan  potassium-hctz, Metoprolol  tartrate, Pravastatin , Rosiglitazone maleate, Simvastatin , Tetracycline, and Tetracyclines & related   Social History:  The patient  reports that he has never smoked. He has never been exposed to tobacco smoke. He has never used smokeless tobacco. He reports that he does not drink alcohol and does not use drugs.   Family History:   family history includes Coronary artery disease in his father; Crohn's disease in his paternal uncle; Prostate cancer in his father.    Review of Systems: Review of Systems  Constitutional: Negative.   HENT: Negative.     Respiratory: Negative.    Cardiovascular: Negative.   Gastrointestinal: Negative.   Musculoskeletal:  Positive for back pain.  Neurological: Negative.   Psychiatric/Behavioral: Negative.    All other systems reviewed and are negative.   PHYSICAL EXAM: VS:  There were no vitals taken for this visit. , BMI There is no height or weight on file to calculate BMI. Constitutional:  oriented to person, place, and time. No distress.  HENT:  Head: Grossly normal Eyes:  no discharge. No scleral icterus.  Neck: No JVD, no carotid bruits  Cardiovascular: Regular rate and rhythm, no murmurs appreciated Pulmonary/Chest: Clear to auscultation bilaterally, no wheezes or rails Abdominal: Soft.  no distension.  no tenderness.  Musculoskeletal: Normal range of motion Neurological:  normal muscle tone. Coordination normal. No atrophy Skin: Skin warm and dry Psychiatric: normal affect, pleasant  Recent Labs: 04/19/2024: ALT 17; BUN 17; Creatinine, Ser 0.98; Hemoglobin 14.9; Platelets 250.0; Potassium 3.8; Sodium 140    Lipid Panel Lab Results  Component Value Date   CHOL 130 04/19/2024   HDL 49.20 04/19/2024   LDLCALC 51 04/19/2024   TRIG 147.0 04/19/2024      Wt Readings from Last 3 Encounters:  07/23/24 192 lb (87.1 kg)  04/19/24 188 lb (85.3 kg)  12/29/23 185 lb (83.9 kg)     ASSESSMENT AND PLAN:  Essential hypertension -  Blood pressure is well controlled on today's visit. No changes made to the medications.  Coronary artery disease involving native coronary artery of native heart without angina pectoris -  Currently with no symptoms of angina. No further workup at this time. Continue current medication regimen. on the Repatha  once a month with good results  Pure hypercholesterolemia Continue Repatha  once monthly dosing, numbers at goal in 2023  OSA (obstructive sleep apnea) Unclear if he is on CPAP  Uncontrolled type 2 diabetes mellitus with complication, without long-term  current use of insulin  (HCC) A1c trending higher, on prednisone  20 daily Working with Dr. 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    Signed, Velinda Lunger, M.D., Ph.D. 09/13/2024  [facility] [place], [place] [phone]  \

## 2024-09-14 ENCOUNTER — Encounter: Payer: Self-pay | Admitting: Cardiovascular Disease

## 2024-09-14 ENCOUNTER — Ambulatory Visit: Attending: Cardiovascular Disease | Admitting: Cardiovascular Disease

## 2024-09-14 VITALS — BP 130/60 | HR 74 | Ht 65.0 in | Wt 193.2 lb

## 2024-09-14 DIAGNOSIS — E785 Hyperlipidemia, unspecified: Secondary | ICD-10-CM | POA: Diagnosis not present

## 2024-09-14 DIAGNOSIS — I25118 Atherosclerotic heart disease of native coronary artery with other forms of angina pectoris: Secondary | ICD-10-CM | POA: Diagnosis not present

## 2024-09-14 DIAGNOSIS — T466X5A Adverse effect of antihyperlipidemic and antiarteriosclerotic drugs, initial encounter: Secondary | ICD-10-CM

## 2024-09-14 DIAGNOSIS — R0600 Dyspnea, unspecified: Secondary | ICD-10-CM

## 2024-09-14 DIAGNOSIS — I10 Essential (primary) hypertension: Secondary | ICD-10-CM

## 2024-09-14 DIAGNOSIS — E1159 Type 2 diabetes mellitus with other circulatory complications: Secondary | ICD-10-CM

## 2024-09-14 DIAGNOSIS — Z951 Presence of aortocoronary bypass graft: Secondary | ICD-10-CM

## 2024-09-14 DIAGNOSIS — G4733 Obstructive sleep apnea (adult) (pediatric): Secondary | ICD-10-CM

## 2024-09-14 DIAGNOSIS — T466X5D Adverse effect of antihyperlipidemic and antiarteriosclerotic drugs, subsequent encounter: Secondary | ICD-10-CM

## 2024-09-14 DIAGNOSIS — M791 Myalgia, unspecified site: Secondary | ICD-10-CM

## 2024-09-14 MED ORDER — FUROSEMIDE 40 MG PO TABS: 40.0000 mg | ORAL_TABLET | Freq: Every day | ORAL | 3 refills | Status: AC | PRN

## 2024-09-14 MED ORDER — REPATHA SURECLICK 140 MG/ML ~~LOC~~ SOAJ: SUBCUTANEOUS | 3 refills | Status: AC

## 2024-09-14 MED ORDER — POTASSIUM CHLORIDE CRYS ER 20 MEQ PO TBCR: 20.0000 meq | EXTENDED_RELEASE_TABLET | Freq: Every day | ORAL | 3 refills | Status: AC | PRN

## 2024-09-14 MED ORDER — CLOPIDOGREL BISULFATE 75 MG PO TABS: 75.0000 mg | ORAL_TABLET | Freq: Every day | ORAL | 3 refills | Status: AC

## 2024-09-14 NOTE — Patient Instructions (Signed)

## 2024-10-01 ENCOUNTER — Other Ambulatory Visit: Payer: Self-pay | Admitting: Family

## 2024-10-08 ENCOUNTER — Other Ambulatory Visit: Payer: Self-pay | Admitting: Nurse Practitioner

## 2024-10-08 NOTE — Telephone Encounter (Signed)
 Copied from CRM #[id number]. Topic: Clinical - Medication Question >> Oct 08, 2024  3:41 PM Pinkey ORN wrote: Reason for CRM: Medication Question >> Oct 08, 2024  3:45 PM Pinkey ORN wrote: [facility] [phone] called on behalf of patient, wanting to know who to send the urgent medication request to, since patient originally was a patient of Dr. Jimmy and he's currently completely out of his medication.   Landon states they've sent over the request multiple times, addressed to Dr. Jimmy and it seems to be an ongoing issue where the office isn't getting it and complying. Please follow up.

## 2024-10-08 NOTE — Telephone Encounter (Signed)
 [name] [name] has had the refill request since 10-01-24. Would you be willing to look at this for him since he will be seeing you as a pt soon? He is very reliable.

## 2024-10-08 NOTE — Telephone Encounter (Unsigned)
 Copied from CRM [id number]. Topic: Clinical - Medication Refill >> Oct 08, 2024 11:51 AM Rea C wrote: Medication: HYDROcodone -acetaminophen  (NORCO/VICODIN) 5-325 MG tablet  Has the patient contacted their pharmacy? Pharmacy called to check in on refill request delay but they sent it to NP [facility]. Patient has TOC w/ Adina Crandall on 10/25/2024- patient previous of Dr. Jimmy.    This is the patient's preferred pharmacy:  [facility] [place], [place] - [address] [place] [address] Phone: [phone] Fax: [phone]  Is this the correct pharmacy for this prescription? Yes If no, delete pharmacy and type the correct one.   Has the prescription been filled recently? Yes  Is the patient out of the medication? Yes  Has the patient been seen for an appointment in the last year OR does the patient have an upcoming appointment? Yes TOC w/ NP Adina Crandall on 10/25/2024  Can we respond through MyChart? Yes  Agent: Please be advised that Rx refills may take up to 3 business days. We ask that you follow-up with your pharmacy.

## 2024-10-09 MED ORDER — HYDROCODONE-ACETAMINOPHEN 5-325 MG PO TABS
1.0000 | ORAL_TABLET | Freq: Two times a day (BID) | ORAL | 0 refills | Status: AC
Start: 2024-10-09 — End: ?

## 2024-10-25 ENCOUNTER — Ambulatory Visit (INDEPENDENT_AMBULATORY_CARE_PROVIDER_SITE_OTHER): Admitting: Nurse Practitioner

## 2024-10-25 ENCOUNTER — Encounter: Payer: Self-pay | Admitting: Nurse Practitioner

## 2024-10-25 VITALS — BP 118/60 | HR 75 | Temp 97.9°F | Ht 64.0 in | Wt 193.0 lb

## 2024-10-25 DIAGNOSIS — M353 Polymyalgia rheumatica: Secondary | ICD-10-CM

## 2024-10-25 DIAGNOSIS — E1159 Type 2 diabetes mellitus with other circulatory complications: Secondary | ICD-10-CM

## 2024-10-25 DIAGNOSIS — E119 Type 2 diabetes mellitus without complications: Secondary | ICD-10-CM | POA: Diagnosis not present

## 2024-10-25 DIAGNOSIS — I10 Essential (primary) hypertension: Secondary | ICD-10-CM

## 2024-10-25 DIAGNOSIS — Z789 Other specified health status: Secondary | ICD-10-CM

## 2024-10-25 DIAGNOSIS — Z7985 Long-term (current) use of injectable non-insulin antidiabetic drugs: Secondary | ICD-10-CM

## 2024-10-25 DIAGNOSIS — F1120 Opioid dependence, uncomplicated: Secondary | ICD-10-CM

## 2024-10-25 DIAGNOSIS — Z7984 Long term (current) use of oral hypoglycemic drugs: Secondary | ICD-10-CM

## 2024-10-25 DIAGNOSIS — I25118 Atherosclerotic heart disease of native coronary artery with other forms of angina pectoris: Secondary | ICD-10-CM

## 2024-10-25 DIAGNOSIS — G894 Chronic pain syndrome: Secondary | ICD-10-CM

## 2024-10-25 LAB — POCT GLYCOSYLATED HEMOGLOBIN (HGB A1C): Hemoglobin A1C: 7.5 % — AB (ref 4.0–5.6)

## 2024-10-25 MED ORDER — EMPAGLIFLOZIN 25 MG PO TABS: 25.0000 mg | ORAL_TABLET | Freq: Every day | ORAL | 1 refills | Status: AC

## 2024-10-25 NOTE — Assessment & Plan Note (Signed)
 Patient currently maintained on losartan  25 mg daily, furosemide  40 mg daily as needed.  Blood pressure controlled continue medication as prescribed

## 2024-10-25 NOTE — Assessment & Plan Note (Signed)
 Maintained on prednisone  and Norco.  Continue medications

## 2024-10-25 NOTE — Assessment & Plan Note (Signed)
 Currently on Norco 5-325 mg twice daily as needed.  For PMR and chronic low back pain and knee pain.  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

## 2024-10-25 NOTE — Assessment & Plan Note (Signed)
 History of CABG and then post CABG stenting.  Patient is followed by cardiology currently maintained on Repatha  and clopidogrel .  Continue following with cardiology

## 2024-10-25 NOTE — Assessment & Plan Note (Signed)
 History of the same patient was weaned down to prednisone  10 mg daily.  As of late has had a flare and increased to 20 mg daily.  Did encourage patient to try to cut back to 5 mg/week until he gets back down to baseline of 10 mg a day.

## 2024-10-25 NOTE — Progress Notes (Signed)
 Established Patient Office Visit  Subjective   Patient ID: [name] [name], male    DOB: [birth year]  Age: 73 y.o. MRN: [id number]  Chief Complaint  Patient presents with   Transitions Of Care    HPI  DM2: Patient currently maintained on Trulicity  3 mg weekly, metformin  1000 mg twice daily, Invokana  300 mg daily, and glipizide  5 mg XL every morning. Will check it once a month.  If he feels off he will check his glucose more frequently.  He did get a letter from his insurance saying Invokana  no longer be covered under formulary.  HLD: Patient currently maintained on clopidogrel  75 mg daily, evolocumab  140 mg every 14 days.  Patient is followed by cardiology.  Patient has adverse event to statins and is status post CABG.  Patient had cardiac stenting status post CABG and currently on clopidogrel   Chronic pain: Patient currently maintained on hydrocodone  5-3 25 twice daily. He has PMR and hurts all over. He does farm and tries to keep moving. States that he is on prednisone  and was down to 10mg  a day. He is doing 20mg  of prednisone  as of late   HTN: Currently maintained on losartan  25 mg daily, furosemide  40 mg as needed, potassium when taken with furosemide .  BPH: Patient currently maintained on tamsulosin  0.4 mg daily. State that he has nocutira on occasion   Tdap: 2017 Flu: Refused COVID: Get at local pharmacy if desired PNA: 2018 Shingles: Completed  Colonoscopy:12/29/2023, recall in 3 years due 2028 PSA:?   Review of Systems  Constitutional:  Negative for chills and fever.  Respiratory:  Negative for shortness of breath.   Cardiovascular:  Negative for chest pain and leg swelling.  Gastrointestinal:  Negative for abdominal pain, blood in stool, constipation, diarrhea, nausea and vomiting.  Genitourinary:  Negative for dysuria and hematuria.  Neurological:  Negative for dizziness, tingling and headaches.  Psychiatric/Behavioral:  Negative for hallucinations and suicidal  ideas.       Objective:     BP 118/60   Pulse 75   Temp 97.9 F (36.6 C) (Oral)   Ht 5' 4 (1.626 m)   Wt 193 lb (87.5 kg)   SpO2 97%   BMI 33.13 kg/m  BP Readings from Last 3 Encounters:  10/25/24 118/60  09/14/24 130/60  07/23/24 120/70   Wt Readings from Last 3 Encounters:  10/25/24 193 lb (87.5 kg)  09/14/24 193 lb 3.2 oz (87.6 kg)  07/23/24 192 lb (87.1 kg)   SpO2 Readings from Last 3 Encounters:  10/25/24 97%  09/14/24 98%  07/23/24 96%      Physical Exam Vitals and nursing note reviewed.  Constitutional:      Appearance: Normal appearance.  HENT:     Right Ear: Tympanic membrane, ear canal and external ear normal.     Left Ear: Tympanic membrane, ear canal and external ear normal.     Mouth/Throat:     Mouth: Mucous membranes are moist.     Pharynx: Oropharynx is clear.  Eyes:     Extraocular Movements: Extraocular movements intact.     Pupils: Pupils are equal, round, and reactive to light.  Cardiovascular:     Rate and Rhythm: Normal rate and regular rhythm.     Pulses: Normal pulses.     Heart sounds: Normal heart sounds.  Pulmonary:     Effort: Pulmonary effort is normal.     Breath sounds: Normal breath sounds.  Abdominal:     General: Bowel  sounds are normal. There is no distension.     Palpations: There is no mass.     Tenderness: There is no abdominal tenderness.     Hernia: No hernia is present.  Musculoskeletal:     Right lower leg: No edema.     Left lower leg: No edema.  Lymphadenopathy:     Cervical: No cervical adenopathy.  Skin:    General: Skin is warm.  Neurological:     General: No focal deficit present.     Mental Status: He is alert.     Deep Tendon Reflexes:     Reflex Scores:      Bicep reflexes are 2+ on the right side and 2+ on the left side.      Patellar reflexes are 2+ on the right side and 2+ on the left side.    Comments: Bilateral upper and lower extremity strength 5/5  Psychiatric:        Mood and Affect:  Mood normal.        Behavior: Behavior normal.        Thought Content: Thought content normal.        Judgment: Judgment normal.      Results for orders placed or performed in visit on 10/25/24  POCT glycosylated hemoglobin (Hb A1C)  Result Value Ref Range   Hemoglobin A1C 7.5 (A) 4.0 - 5.6 %   HbA1c POC (<> result, manual entry)     HbA1c, POC (prediabetic range)     HbA1c, POC (controlled diabetic range)        The 10-year ASCVD risk score (Arnett DK, et al., 2019) is: 35.8%    Assessment & Plan:   Problem List Items Addressed This Visit       Cardiovascular and Mediastinum   Essential hypertension (Chronic)   Patient currently maintained on losartan  25 mg daily, furosemide  40 mg daily as needed.  Blood pressure controlled continue medication as prescribed      Coronary artery disease of native artery of native heart with stable angina pectoris (Chronic)   History of CABG and then post CABG stenting.  Patient is followed by cardiology currently maintained on Repatha  and clopidogrel .  Continue following with cardiology      Type 2 diabetes mellitus with circulatory disorder [facility])   Patient currently maintained on Invokana  300 mg daily, glipizide  5 mg XL daily, metformin  1000 mg twice daily, Trulicity  3 mg weekly.  Patient is A1c 7.5%.  I think A1c under 8 is acceptable for patient's age.  He is on prednisone  chronically for PMR this can exacerbate patient's sugar levels.      Relevant Medications   empagliflozin (JARDIANCE) 25 MG TABS tablet   Other Relevant Orders   POCT glycosylated hemoglobin (Hb A1C) (Completed)     Endocrine   Diabetes mellitus treated with injections of non-insulin  medication (HCC)   Relevant Medications   empagliflozin (JARDIANCE) 25 MG TABS tablet   Diabetes mellitus treated with oral medication (HCC) - Primary   Relevant Medications   empagliflozin (JARDIANCE) 25 MG TABS tablet     Other   Narcotic dependence (HCC)   Currently on Norco  5-325 mg twice daily as needed.  For PMR and chronic low back pain and knee pain.  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  Continue medications      Statin intolerance   Followed by  cardiology currently on Repatha       Polymyalgia rheumatica syndrome   History of the same patient was weaned down to prednisone  10 mg daily.  As of late has had a flare and increased to 20 mg daily.  Did encourage patient to try to cut back to 5 mg/week until he gets back down to baseline of 10 mg a day.       Return in about 3 months (around 01/25/2025) for DM recheck/ prednisone / pain meds.    Adina Crandall, NP

## 2024-10-25 NOTE — Assessment & Plan Note (Signed)
Followed by cardiology currently on [place]man.

## 2024-10-25 NOTE — Patient Instructions (Signed)
 Nice to see you today I have sent in a medication called Jardance. This is going to replace your Invokana  Follow up with mein 3 months, sooner if you need me

## 2024-10-25 NOTE — Assessment & Plan Note (Signed)
 Patient currently maintained on Invokana  300 mg daily, glipizide  5 mg XL daily, metformin  1000 mg twice daily, Trulicity  3 mg weekly.  Patient is A1c 7.5%.  I think A1c under 8 is acceptable for patient's age.  He is on prednisone  chronically for PMR this can exacerbate patient's sugar levels.

## 2024-11-07 ENCOUNTER — Telehealth: Payer: Self-pay

## 2024-11-07 MED ORDER — GLIPIZIDE ER 5 MG PO TB24: ORAL_TABLET | ORAL | 11 refills | Status: AC

## 2024-11-07 NOTE — Telephone Encounter (Signed)
 LAST APPOINTMENT DATE: 10/25/2024  NEXT APPOINTMENT DATE: 01/25/2025  Glipizide  ER 5 mg  LAST REFILL: 10/11/23  QTY: #30, 11RF

## 2024-11-19 ENCOUNTER — Other Ambulatory Visit: Payer: Self-pay | Admitting: Family

## 2024-11-20 ENCOUNTER — Other Ambulatory Visit: Payer: Self-pay | Admitting: Family

## 2024-12-03 NOTE — Progress Notes (Signed)
 "  Subjective:    Patient ID: [name] [name], male    DOB: 1951/11/14, 73 y.o.   MRN: [id number]  HPI male never smoker followed for OSA, complicated by DM 2, CAD/ CABG, HBP, GERD, chronic pain/narcotic dependence, hyperlipidemia, BPH NPSG 1998:  AHI 58/hr.  --------------------------------------------------------------------------   12/05/23- 73 year old male never smoker followed for OSA, complicated by DM 2, CAD/ CABG, HBP, GERD, chronic pain/narcotic dependence, hyperlipidemia, BPH, Lumbar Disc Disease,  -Ventolin  hfa,  CPAP 5-15 auto [employer] Download- compliance   100%, 1.6/hr AirSense 10 autoset Body weight today-192 lbs Discussed the use of AI scribe software for clinical note transcription with the patient, who gave verbal consent to proceed.  History of Present Illness   The patient, with a history of sleep apnea and asthma, presents with head congestion. He denies fever and sore throat. He started over-the-counter cold medicine yesterday for symptom relief. He reports some chest tightness but denies any other respiratory symptoms. He has not used his Ventolin  inhaler in a long time.  Regarding his sleep apnea, he is comfortable with his CPAP machine but reports needing a new mask as the current one is beginning to wear out. The patient's machine is approximately 41-63 years old.    12/04/24- 73 year old male never smoker followed for OSA, complicated by DM 2, CAD/ CABG, HBP, GERD, chronic pain/narcotic dependence, hyperlipidemia, BPH, Lumbar Disc Disease,  -Ventolin  hfa,  CPAP 5-15 auto [employer] AirSense 10 autoset Download- compliance   100%, AHI 1.1/hr Body weight today- 190 lbs -----Pt states no problems with his Cpap been doing well  Discussed the use of AI scribe software for clinical note transcription with the patient, who gave verbal consent to proceed.  History of Present Illness   [name] [name] is a 73 year old male who presents for a follow-up  regarding CPAP therapy.  He uses CPAP every night with auto settings of 5 to 15 cm H2O, typically around 7 to 8 cm. His residual AHI is under 2 events per hour. He is comfortable with therapy and does not want any changes.  He has an albuterol  inhaler for intermittent breathing issues but has not needed to use it and is unsure who prescribed it.  He takes blood thinners managed by his cardiologist.     Assessment and Plan:    Obstructive sleep apnea Well-managed with CPAP therapy. CPAP settings effective with <2 apneic events/hour. - Continue current CPAP therapy. - Referred to Dr. Devona Skiff, sleep specialist, for follow-up so he can stay in  [place].           ROS-see HPI   + = positive Constitutional:    weight loss, night sweats, fevers, chills, fatigue, lassitude. HEENT:    headaches, difficulty swallowing, tooth/dental problems, sore throat,       sneezing, itching, ear ache, +nasal congestion, post nasal drip, snoring CV:    chest pain, orthopnea, PND, swelling in lower extremities, anasarca,                                                   dizziness, palpitations Resp:   shortness of breath with exertion or at rest.                productive cough,   non-productive cough, coughing up of blood.  change in color of mucus.  wheezing.   Skin:    rash or lesions. GI:  No-   heartburn, indigestion, abdominal pain, nausea, vomiting, diarrhea,                 change in bowel habits, loss of appetite GU: dysuria, change in color of urine, no urgency or frequency.   flank pain. MS:  + joint pain, stiffness, decreased range of motion, back pain. Neuro-     nothing unusual Psych:  change in mood or affect.  depression or anxiety.   memory loss.    Objective:  OBJ- Physical Exam General- Alert, Oriented, Affect-appropriate, Distress- none acute, + Full beard, +overweight Skin- + sun damage and friable skin forearms with band aids Lymphadenopathy- none Head-  atraumatic            Eyes- Gross vision intact, PERRLA, conjunctivae and secretions clear            Ears- Hearing, canals-normal            Nose- Clear, no-Septal dev, mucus, polyps, erosion, perforation             Throat- Mallampati II-III , mucosa clear , drainage- none, tonsils- atrophic Neck- flexible , trachea midline, no stridor , thyroid  nl, carotid no bruit Chest - symmetrical excursion , unlabored           Heart/CV- RRR , no murmur , no gallop  , no rub, nl s1 s2                           - JVD- none , edema- none, stasis changes- none, varices- none           Lung- clear to P&A, wheeze- none, cough- none , dullness-none, rub- none           Chest wall-  Abd-  Br/ Gen/ Rectal- Not done, not indicated Extrem- cyanosis- none, clubbing, none, atrophy- none, strength- nl Neuro- grossly intact to observation    Assessment & Plan:   "

## 2024-12-04 ENCOUNTER — Encounter: Payer: Self-pay | Admitting: Internal Medicine

## 2024-12-04 ENCOUNTER — Ambulatory Visit: Payer: Medicare HMO | Admitting: Internal Medicine

## 2024-12-04 VITALS — BP 153/80 | HR 77 | Ht 65.0 in | Wt 190.8 lb

## 2024-12-04 DIAGNOSIS — G4733 Obstructive sleep apnea (adult) (pediatric): Secondary | ICD-10-CM | POA: Diagnosis not present

## 2024-12-04 NOTE — Patient Instructions (Signed)
 Glad you are doing well. We can continue CPAP auto 5-15, mask of choice, humidifier, supplies, AirView/ card  Please call if we can help.  At checkout, ask the front desk to bring you back next year with one of our sleep doctors

## 2024-12-20 ENCOUNTER — Other Ambulatory Visit: Payer: Self-pay | Admitting: Nurse Practitioner

## 2025-01-02 ENCOUNTER — Other Ambulatory Visit: Payer: Self-pay | Admitting: Nurse Practitioner

## 2025-01-25 ENCOUNTER — Ambulatory Visit: Admitting: Nurse Practitioner
